# Patient Record
Sex: Female | Born: 1970 | Hispanic: Yes | Marital: Married | Smoking: Never smoker
Health system: Southern US, Community
[De-identification: ages and names within clinical notes are randomized; demographics above are authoritative.]

## PROBLEM LIST (undated history)

## (undated) DIAGNOSIS — E119 Type 2 diabetes mellitus without complications: Secondary | ICD-10-CM

## (undated) DIAGNOSIS — N83202 Unspecified ovarian cyst, left side: Secondary | ICD-10-CM

## (undated) DIAGNOSIS — Z8673 Personal history of transient ischemic attack (TIA), and cerebral infarction without residual deficits: Secondary | ICD-10-CM

## (undated) DIAGNOSIS — K760 Fatty (change of) liver, not elsewhere classified: Secondary | ICD-10-CM

## (undated) DIAGNOSIS — E039 Hypothyroidism, unspecified: Secondary | ICD-10-CM

## (undated) DIAGNOSIS — T8859XA Other complications of anesthesia, initial encounter: Secondary | ICD-10-CM

## (undated) DIAGNOSIS — N83201 Unspecified ovarian cyst, right side: Secondary | ICD-10-CM

## (undated) DIAGNOSIS — K219 Gastro-esophageal reflux disease without esophagitis: Secondary | ICD-10-CM

## (undated) DIAGNOSIS — Z8759 Personal history of other complications of pregnancy, childbirth and the puerperium: Secondary | ICD-10-CM

## (undated) DIAGNOSIS — T4145XA Adverse effect of unspecified anesthetic, initial encounter: Secondary | ICD-10-CM

## (undated) DIAGNOSIS — Z8639 Personal history of other endocrine, nutritional and metabolic disease: Secondary | ICD-10-CM

## (undated) DIAGNOSIS — I10 Essential (primary) hypertension: Secondary | ICD-10-CM

## (undated) DIAGNOSIS — I639 Cerebral infarction, unspecified: Secondary | ICD-10-CM

## (undated) DIAGNOSIS — J45998 Other asthma: Secondary | ICD-10-CM

## (undated) DIAGNOSIS — E282 Polycystic ovarian syndrome: Secondary | ICD-10-CM

## (undated) DIAGNOSIS — Z8719 Personal history of other diseases of the digestive system: Secondary | ICD-10-CM

## (undated) DIAGNOSIS — N7011 Chronic salpingitis: Secondary | ICD-10-CM

## (undated) DIAGNOSIS — N83209 Unspecified ovarian cyst, unspecified side: Secondary | ICD-10-CM

## (undated) DIAGNOSIS — E0590 Thyrotoxicosis, unspecified without thyrotoxic crisis or storm: Secondary | ICD-10-CM

## (undated) DIAGNOSIS — Z9889 Other specified postprocedural states: Secondary | ICD-10-CM

## (undated) DIAGNOSIS — K649 Unspecified hemorrhoids: Secondary | ICD-10-CM

## (undated) DIAGNOSIS — G43909 Migraine, unspecified, not intractable, without status migrainosus: Secondary | ICD-10-CM

## (undated) DIAGNOSIS — J45909 Unspecified asthma, uncomplicated: Secondary | ICD-10-CM

## (undated) DIAGNOSIS — E739 Lactose intolerance, unspecified: Secondary | ICD-10-CM

## (undated) DIAGNOSIS — N736 Female pelvic peritoneal adhesions (postinfective): Secondary | ICD-10-CM

## (undated) DIAGNOSIS — E890 Postprocedural hypothyroidism: Secondary | ICD-10-CM

## (undated) DIAGNOSIS — G8929 Other chronic pain: Secondary | ICD-10-CM

## (undated) DIAGNOSIS — D649 Anemia, unspecified: Secondary | ICD-10-CM

## (undated) DIAGNOSIS — R112 Nausea with vomiting, unspecified: Secondary | ICD-10-CM

## (undated) DIAGNOSIS — O039 Complete or unspecified spontaneous abortion without complication: Secondary | ICD-10-CM

## (undated) HISTORY — DX: Hypothyroidism, unspecified: E03.9

## (undated) HISTORY — PX: THYROID SURGERY: SHX805

## (undated) HISTORY — PX: COLONOSCOPY: SHX174

## (undated) HISTORY — DX: Thyrotoxicosis, unspecified without thyrotoxic crisis or storm: E05.90

## (undated) HISTORY — DX: Fatty (change of) liver, not elsewhere classified: K76.0

## (undated) HISTORY — PX: LAPAROSCOPIC LYSIS INTESTINAL ADHESIONS: SUR778

## (undated) HISTORY — DX: Lactose intolerance, unspecified: E73.9

## (undated) HISTORY — PX: ANAL SPHINCTEROTOMY: SHX1140

## (undated) HISTORY — DX: Complete or unspecified spontaneous abortion without complication: O03.9

## (undated) HISTORY — DX: Unspecified asthma, uncomplicated: J45.909

## (undated) HISTORY — PX: EXPLORATORY LAPAROTOMY: SUR591

## (undated) HISTORY — PX: FLEXIBLE SIGMOIDOSCOPY: SHX1649

## (undated) HISTORY — PX: CERVICAL CERCLAGE: SHX1329

---

## 1995-09-04 HISTORY — PX: LAPAROSCOPIC CHOLECYSTECTOMY: SUR755

## 1995-09-04 HISTORY — PX: APPENDECTOMY: SHX54

## 1995-09-04 HISTORY — PX: CHOLECYSTECTOMY: SHX55

## 2000-04-08 ENCOUNTER — Emergency Department (HOSPITAL_COMMUNITY): Admission: EM | Admit: 2000-04-08 | Discharge: 2000-04-08 | Payer: Self-pay | Admitting: Emergency Medicine

## 2000-04-08 ENCOUNTER — Encounter: Payer: Self-pay | Admitting: Emergency Medicine

## 2000-04-09 ENCOUNTER — Encounter: Payer: Self-pay | Admitting: General Surgery

## 2000-04-09 ENCOUNTER — Ambulatory Visit (HOSPITAL_COMMUNITY): Admission: RE | Admit: 2000-04-09 | Discharge: 2000-04-09 | Payer: Self-pay | Admitting: General Surgery

## 2000-04-30 ENCOUNTER — Ambulatory Visit (HOSPITAL_COMMUNITY): Admission: RE | Admit: 2000-04-30 | Discharge: 2000-04-30 | Payer: Self-pay | Admitting: Gastroenterology

## 2000-04-30 ENCOUNTER — Encounter: Payer: Self-pay | Admitting: Gastroenterology

## 2000-10-21 ENCOUNTER — Encounter: Admission: RE | Admit: 2000-10-21 | Discharge: 2000-10-21 | Payer: Self-pay | Admitting: Family Medicine

## 2000-10-28 ENCOUNTER — Encounter: Admission: RE | Admit: 2000-10-28 | Discharge: 2000-10-28 | Payer: Self-pay | Admitting: Family Medicine

## 2000-12-09 ENCOUNTER — Encounter: Admission: RE | Admit: 2000-12-09 | Discharge: 2000-12-09 | Payer: Self-pay | Admitting: Family Medicine

## 2000-12-30 ENCOUNTER — Encounter: Admission: RE | Admit: 2000-12-30 | Discharge: 2000-12-30 | Payer: Self-pay | Admitting: Family Medicine

## 2001-01-13 ENCOUNTER — Encounter: Admission: RE | Admit: 2001-01-13 | Discharge: 2001-01-13 | Payer: Self-pay | Admitting: Family Medicine

## 2001-01-27 ENCOUNTER — Encounter: Admission: RE | Admit: 2001-01-27 | Discharge: 2001-01-27 | Payer: Self-pay | Admitting: Family Medicine

## 2001-02-03 ENCOUNTER — Encounter: Admission: RE | Admit: 2001-02-03 | Discharge: 2001-02-03 | Payer: Self-pay | Admitting: Family Medicine

## 2001-02-17 ENCOUNTER — Ambulatory Visit (HOSPITAL_COMMUNITY): Admission: RE | Admit: 2001-02-17 | Discharge: 2001-02-17 | Payer: Self-pay | Admitting: Family Medicine

## 2001-02-18 ENCOUNTER — Encounter: Admission: RE | Admit: 2001-02-18 | Discharge: 2001-02-18 | Payer: Self-pay | Admitting: Sports Medicine

## 2001-02-18 ENCOUNTER — Encounter: Payer: Self-pay | Admitting: Family Medicine

## 2001-03-19 ENCOUNTER — Ambulatory Visit (HOSPITAL_COMMUNITY): Admission: RE | Admit: 2001-03-19 | Discharge: 2001-03-19 | Payer: Self-pay | Admitting: Family Medicine

## 2001-03-19 ENCOUNTER — Encounter: Payer: Self-pay | Admitting: Family Medicine

## 2001-03-24 ENCOUNTER — Encounter: Admission: RE | Admit: 2001-03-24 | Discharge: 2001-03-24 | Payer: Self-pay | Admitting: Family Medicine

## 2001-04-25 ENCOUNTER — Encounter: Admission: RE | Admit: 2001-04-25 | Discharge: 2001-04-25 | Payer: Self-pay | Admitting: Family Medicine

## 2001-05-21 ENCOUNTER — Encounter: Admission: RE | Admit: 2001-05-21 | Discharge: 2001-05-21 | Payer: Self-pay | Admitting: Family Medicine

## 2001-07-14 ENCOUNTER — Encounter: Admission: RE | Admit: 2001-07-14 | Discharge: 2001-07-14 | Payer: Self-pay | Admitting: Family Medicine

## 2001-07-16 ENCOUNTER — Encounter: Admission: RE | Admit: 2001-07-16 | Discharge: 2001-07-16 | Payer: Self-pay | Admitting: Family Medicine

## 2001-10-01 ENCOUNTER — Encounter: Admission: RE | Admit: 2001-10-01 | Discharge: 2001-10-01 | Payer: Self-pay | Admitting: Family Medicine

## 2001-10-13 ENCOUNTER — Encounter: Admission: RE | Admit: 2001-10-13 | Discharge: 2001-10-13 | Payer: Self-pay | Admitting: Family Medicine

## 2001-10-22 ENCOUNTER — Encounter: Admission: RE | Admit: 2001-10-22 | Discharge: 2001-10-22 | Payer: Self-pay | Admitting: Family Medicine

## 2001-10-24 ENCOUNTER — Encounter: Payer: Self-pay | Admitting: Family Medicine

## 2001-10-24 ENCOUNTER — Encounter: Admission: RE | Admit: 2001-10-24 | Discharge: 2001-10-24 | Payer: Self-pay | Admitting: Family Medicine

## 2001-12-09 ENCOUNTER — Encounter: Admission: RE | Admit: 2001-12-09 | Discharge: 2001-12-09 | Payer: Self-pay | Admitting: Family Medicine

## 2001-12-22 ENCOUNTER — Encounter: Admission: RE | Admit: 2001-12-22 | Discharge: 2001-12-22 | Payer: Self-pay | Admitting: Family Medicine

## 2001-12-23 ENCOUNTER — Encounter: Admission: RE | Admit: 2001-12-23 | Discharge: 2001-12-23 | Payer: Self-pay | Admitting: Family Medicine

## 2002-01-20 ENCOUNTER — Encounter: Admission: RE | Admit: 2002-01-20 | Discharge: 2002-01-20 | Payer: Self-pay | Admitting: Family Medicine

## 2002-06-29 ENCOUNTER — Encounter: Admission: RE | Admit: 2002-06-29 | Discharge: 2002-06-29 | Payer: Self-pay | Admitting: Family Medicine

## 2002-07-02 ENCOUNTER — Encounter: Admission: RE | Admit: 2002-07-02 | Discharge: 2002-07-02 | Payer: Self-pay | Admitting: Family Medicine

## 2002-07-13 ENCOUNTER — Encounter: Admission: RE | Admit: 2002-07-13 | Discharge: 2002-07-13 | Payer: Self-pay | Admitting: Family Medicine

## 2002-07-27 ENCOUNTER — Encounter: Admission: RE | Admit: 2002-07-27 | Discharge: 2002-07-27 | Payer: Self-pay | Admitting: Sports Medicine

## 2002-08-21 ENCOUNTER — Encounter: Admission: RE | Admit: 2002-08-21 | Discharge: 2002-08-21 | Payer: Self-pay | Admitting: Family Medicine

## 2002-08-24 ENCOUNTER — Encounter: Admission: RE | Admit: 2002-08-24 | Discharge: 2002-08-24 | Payer: Self-pay | Admitting: Family Medicine

## 2002-11-02 ENCOUNTER — Encounter (INDEPENDENT_AMBULATORY_CARE_PROVIDER_SITE_OTHER): Payer: Self-pay | Admitting: *Deleted

## 2002-11-02 LAB — CONVERTED CEMR LAB

## 2002-11-03 ENCOUNTER — Encounter: Admission: RE | Admit: 2002-11-03 | Discharge: 2002-11-03 | Payer: Self-pay | Admitting: Family Medicine

## 2002-11-26 ENCOUNTER — Encounter: Admission: RE | Admit: 2002-11-26 | Discharge: 2002-11-26 | Payer: Self-pay | Admitting: Family Medicine

## 2002-11-28 ENCOUNTER — Emergency Department (HOSPITAL_COMMUNITY): Admission: EM | Admit: 2002-11-28 | Discharge: 2002-11-28 | Payer: Self-pay | Admitting: Emergency Medicine

## 2002-12-07 ENCOUNTER — Encounter: Admission: RE | Admit: 2002-12-07 | Discharge: 2002-12-07 | Payer: Self-pay | Admitting: Family Medicine

## 2002-12-16 ENCOUNTER — Encounter: Admission: RE | Admit: 2002-12-16 | Discharge: 2002-12-16 | Payer: Self-pay | Admitting: Family Medicine

## 2002-12-18 ENCOUNTER — Encounter: Admission: RE | Admit: 2002-12-18 | Discharge: 2002-12-18 | Payer: Self-pay | Admitting: Family Medicine

## 2002-12-21 ENCOUNTER — Encounter: Admission: RE | Admit: 2002-12-21 | Discharge: 2002-12-21 | Payer: Self-pay | Admitting: Family Medicine

## 2002-12-24 ENCOUNTER — Encounter: Admission: RE | Admit: 2002-12-24 | Discharge: 2002-12-24 | Payer: Self-pay | Admitting: Family Medicine

## 2003-01-06 ENCOUNTER — Encounter: Admission: RE | Admit: 2003-01-06 | Discharge: 2003-01-06 | Payer: Self-pay | Admitting: Family Medicine

## 2003-01-11 ENCOUNTER — Encounter: Admission: RE | Admit: 2003-01-11 | Discharge: 2003-01-11 | Payer: Self-pay | Admitting: Family Medicine

## 2003-02-17 ENCOUNTER — Encounter: Admission: RE | Admit: 2003-02-17 | Discharge: 2003-02-17 | Payer: Self-pay | Admitting: Family Medicine

## 2003-03-10 ENCOUNTER — Encounter: Admission: RE | Admit: 2003-03-10 | Discharge: 2003-03-10 | Payer: Self-pay | Admitting: Family Medicine

## 2003-03-17 ENCOUNTER — Encounter: Admission: RE | Admit: 2003-03-17 | Discharge: 2003-03-17 | Payer: Self-pay | Admitting: Family Medicine

## 2003-05-20 ENCOUNTER — Encounter: Admission: RE | Admit: 2003-05-20 | Discharge: 2003-05-20 | Payer: Self-pay | Admitting: Family Medicine

## 2003-06-08 ENCOUNTER — Encounter: Admission: RE | Admit: 2003-06-08 | Discharge: 2003-06-08 | Payer: Self-pay | Admitting: Family Medicine

## 2003-07-07 ENCOUNTER — Encounter: Admission: RE | Admit: 2003-07-07 | Discharge: 2003-07-07 | Payer: Self-pay | Admitting: Family Medicine

## 2003-09-08 ENCOUNTER — Encounter: Admission: RE | Admit: 2003-09-08 | Discharge: 2003-09-08 | Payer: Self-pay | Admitting: Family Medicine

## 2003-10-27 ENCOUNTER — Encounter: Admission: RE | Admit: 2003-10-27 | Discharge: 2003-10-27 | Payer: Self-pay | Admitting: Family Medicine

## 2003-11-04 ENCOUNTER — Emergency Department (HOSPITAL_COMMUNITY): Admission: EM | Admit: 2003-11-04 | Discharge: 2003-11-04 | Payer: Self-pay | Admitting: Physical Therapy

## 2003-11-10 ENCOUNTER — Encounter: Admission: RE | Admit: 2003-11-10 | Discharge: 2003-11-10 | Payer: Self-pay | Admitting: Family Medicine

## 2003-12-13 ENCOUNTER — Encounter: Admission: RE | Admit: 2003-12-13 | Discharge: 2003-12-13 | Payer: Self-pay | Admitting: Family Medicine

## 2004-03-03 HISTORY — PX: EXPLORATORY LAPAROTOMY: SUR591

## 2004-06-21 ENCOUNTER — Ambulatory Visit: Payer: Self-pay | Admitting: Family Medicine

## 2004-08-02 ENCOUNTER — Ambulatory Visit: Payer: Self-pay | Admitting: Family Medicine

## 2004-10-24 ENCOUNTER — Ambulatory Visit: Payer: Self-pay | Admitting: Family Medicine

## 2004-11-01 ENCOUNTER — Ambulatory Visit: Payer: Self-pay | Admitting: Family Medicine

## 2004-11-01 ENCOUNTER — Encounter: Admission: RE | Admit: 2004-11-01 | Discharge: 2004-11-01 | Payer: Self-pay | Admitting: Family Medicine

## 2004-11-01 ENCOUNTER — Inpatient Hospital Stay (HOSPITAL_COMMUNITY): Admission: AD | Admit: 2004-11-01 | Discharge: 2004-11-01 | Payer: Self-pay | Admitting: *Deleted

## 2004-11-01 ENCOUNTER — Encounter (INDEPENDENT_AMBULATORY_CARE_PROVIDER_SITE_OTHER): Payer: Self-pay | Admitting: Family Medicine

## 2004-12-27 ENCOUNTER — Ambulatory Visit: Payer: Self-pay | Admitting: Family Medicine

## 2005-01-02 ENCOUNTER — Ambulatory Visit: Payer: Self-pay | Admitting: Family Medicine

## 2005-01-03 ENCOUNTER — Ambulatory Visit (HOSPITAL_COMMUNITY): Admission: RE | Admit: 2005-01-03 | Discharge: 2005-01-03 | Payer: Self-pay | Admitting: Family Medicine

## 2005-01-30 ENCOUNTER — Ambulatory Visit: Payer: Self-pay | Admitting: Family Medicine

## 2005-02-26 ENCOUNTER — Ambulatory Visit (HOSPITAL_COMMUNITY): Admission: RE | Admit: 2005-02-26 | Discharge: 2005-02-26 | Payer: Self-pay | Admitting: Family Medicine

## 2005-03-13 ENCOUNTER — Ambulatory Visit: Payer: Self-pay | Admitting: Family Medicine

## 2005-09-07 ENCOUNTER — Inpatient Hospital Stay (HOSPITAL_COMMUNITY): Admission: AD | Admit: 2005-09-07 | Discharge: 2005-09-07 | Payer: Self-pay | Admitting: Family Medicine

## 2005-10-02 ENCOUNTER — Ambulatory Visit: Payer: Self-pay | Admitting: Family Medicine

## 2005-10-16 ENCOUNTER — Ambulatory Visit: Admission: RE | Admit: 2005-10-16 | Discharge: 2005-10-16 | Payer: Self-pay | Admitting: Emergency Medicine

## 2005-10-16 ENCOUNTER — Emergency Department (HOSPITAL_COMMUNITY): Admission: EM | Admit: 2005-10-16 | Discharge: 2005-10-16 | Payer: Self-pay | Admitting: Family Medicine

## 2005-11-07 ENCOUNTER — Ambulatory Visit: Payer: Self-pay | Admitting: Family Medicine

## 2005-11-27 ENCOUNTER — Emergency Department (HOSPITAL_COMMUNITY): Admission: EM | Admit: 2005-11-27 | Discharge: 2005-11-27 | Payer: Self-pay | Admitting: Family Medicine

## 2005-11-29 ENCOUNTER — Ambulatory Visit: Payer: Self-pay | Admitting: Family Medicine

## 2006-02-23 ENCOUNTER — Emergency Department (HOSPITAL_COMMUNITY): Admission: EM | Admit: 2006-02-23 | Discharge: 2006-02-24 | Payer: Self-pay | Admitting: Emergency Medicine

## 2006-02-25 ENCOUNTER — Ambulatory Visit: Payer: Self-pay | Admitting: Family Medicine

## 2006-02-27 ENCOUNTER — Ambulatory Visit: Payer: Self-pay | Admitting: Family Medicine

## 2006-03-19 ENCOUNTER — Ambulatory Visit: Payer: Self-pay | Admitting: Family Medicine

## 2006-03-20 LAB — CONVERTED CEMR LAB
LDL Cholesterol: 177 mg/dL
VLDL: 65 mg/dL

## 2006-04-01 ENCOUNTER — Ambulatory Visit: Payer: Self-pay | Admitting: Family Medicine

## 2006-04-03 ENCOUNTER — Ambulatory Visit (HOSPITAL_COMMUNITY): Admission: RE | Admit: 2006-04-03 | Discharge: 2006-04-03 | Payer: Self-pay | Admitting: Family Medicine

## 2006-04-03 ENCOUNTER — Ambulatory Visit: Payer: Self-pay | Admitting: Family Medicine

## 2006-04-11 ENCOUNTER — Inpatient Hospital Stay (HOSPITAL_COMMUNITY): Admission: AD | Admit: 2006-04-11 | Discharge: 2006-04-11 | Payer: Self-pay | Admitting: Family Medicine

## 2006-04-17 ENCOUNTER — Inpatient Hospital Stay (HOSPITAL_COMMUNITY): Admission: AD | Admit: 2006-04-17 | Discharge: 2006-04-17 | Payer: Self-pay | Admitting: Gynecology

## 2006-04-22 ENCOUNTER — Inpatient Hospital Stay (HOSPITAL_COMMUNITY): Admission: AD | Admit: 2006-04-22 | Discharge: 2006-04-22 | Payer: Self-pay | Admitting: Gynecology

## 2006-04-25 ENCOUNTER — Inpatient Hospital Stay (HOSPITAL_COMMUNITY): Admission: AD | Admit: 2006-04-25 | Discharge: 2006-04-25 | Payer: Self-pay | Admitting: Obstetrics and Gynecology

## 2006-04-28 ENCOUNTER — Inpatient Hospital Stay (HOSPITAL_COMMUNITY): Admission: AD | Admit: 2006-04-28 | Discharge: 2006-04-28 | Payer: Self-pay | Admitting: Family Medicine

## 2006-04-30 ENCOUNTER — Ambulatory Visit: Payer: Self-pay | Admitting: Obstetrics and Gynecology

## 2006-04-30 ENCOUNTER — Inpatient Hospital Stay (HOSPITAL_COMMUNITY): Admission: AD | Admit: 2006-04-30 | Discharge: 2006-05-01 | Payer: Self-pay | Admitting: Family Medicine

## 2006-05-05 ENCOUNTER — Inpatient Hospital Stay (HOSPITAL_COMMUNITY): Admission: AD | Admit: 2006-05-05 | Discharge: 2006-05-06 | Payer: Self-pay | Admitting: Obstetrics and Gynecology

## 2006-05-12 ENCOUNTER — Inpatient Hospital Stay (HOSPITAL_COMMUNITY): Admission: AD | Admit: 2006-05-12 | Discharge: 2006-05-12 | Payer: Self-pay | Admitting: Obstetrics & Gynecology

## 2006-05-26 ENCOUNTER — Inpatient Hospital Stay (HOSPITAL_COMMUNITY): Admission: AD | Admit: 2006-05-26 | Discharge: 2006-05-26 | Payer: Self-pay | Admitting: Family Medicine

## 2006-05-29 ENCOUNTER — Emergency Department (HOSPITAL_COMMUNITY): Admission: EM | Admit: 2006-05-29 | Discharge: 2006-05-30 | Payer: Self-pay | Admitting: Emergency Medicine

## 2006-07-18 ENCOUNTER — Emergency Department (HOSPITAL_COMMUNITY): Admission: EM | Admit: 2006-07-18 | Discharge: 2006-07-18 | Payer: Self-pay | Admitting: Emergency Medicine

## 2006-08-21 ENCOUNTER — Ambulatory Visit: Payer: Self-pay | Admitting: Family Medicine

## 2006-09-05 ENCOUNTER — Emergency Department (HOSPITAL_COMMUNITY): Admission: EM | Admit: 2006-09-05 | Discharge: 2006-09-05 | Payer: Self-pay | Admitting: Family Medicine

## 2006-09-09 ENCOUNTER — Encounter: Payer: Self-pay | Admitting: Family Medicine

## 2006-09-09 ENCOUNTER — Ambulatory Visit: Payer: Self-pay | Admitting: Family Medicine

## 2006-09-09 LAB — CONVERTED CEMR LAB
ALT: 31 units/L (ref 0–35)
Albumin: 4.1 g/dL (ref 3.5–5.2)
CO2: 25 meq/L (ref 19–32)
Calcium: 9.2 mg/dL (ref 8.4–10.5)
Chloride: 106 meq/L (ref 96–112)
Glucose, Bld: 80 mg/dL (ref 70–99)
Sodium: 142 meq/L (ref 135–145)
TSH: 2.156 microintl units/mL (ref 0.350–5.50)
Total Protein: 7.2 g/dL (ref 6.0–8.3)

## 2006-10-06 ENCOUNTER — Emergency Department (HOSPITAL_COMMUNITY): Admission: EM | Admit: 2006-10-06 | Discharge: 2006-10-06 | Payer: Self-pay | Admitting: Family Medicine

## 2006-10-08 ENCOUNTER — Ambulatory Visit: Payer: Self-pay | Admitting: Family Medicine

## 2006-10-16 ENCOUNTER — Ambulatory Visit: Payer: Self-pay | Admitting: Sports Medicine

## 2006-10-31 DIAGNOSIS — I10 Essential (primary) hypertension: Secondary | ICD-10-CM | POA: Insufficient documentation

## 2006-10-31 DIAGNOSIS — E785 Hyperlipidemia, unspecified: Secondary | ICD-10-CM | POA: Insufficient documentation

## 2006-10-31 DIAGNOSIS — E669 Obesity, unspecified: Secondary | ICD-10-CM | POA: Insufficient documentation

## 2006-10-31 DIAGNOSIS — K429 Umbilical hernia without obstruction or gangrene: Secondary | ICD-10-CM | POA: Insufficient documentation

## 2006-10-31 DIAGNOSIS — E663 Overweight: Secondary | ICD-10-CM | POA: Insufficient documentation

## 2006-10-31 DIAGNOSIS — Z6825 Body mass index (BMI) 25.0-25.9, adult: Secondary | ICD-10-CM | POA: Insufficient documentation

## 2006-10-31 DIAGNOSIS — E1169 Type 2 diabetes mellitus with other specified complication: Secondary | ICD-10-CM | POA: Insufficient documentation

## 2006-10-31 DIAGNOSIS — E039 Hypothyroidism, unspecified: Secondary | ICD-10-CM | POA: Insufficient documentation

## 2006-10-31 DIAGNOSIS — J45909 Unspecified asthma, uncomplicated: Secondary | ICD-10-CM

## 2006-10-31 DIAGNOSIS — N926 Irregular menstruation, unspecified: Secondary | ICD-10-CM | POA: Insufficient documentation

## 2006-11-01 ENCOUNTER — Encounter (INDEPENDENT_AMBULATORY_CARE_PROVIDER_SITE_OTHER): Payer: Self-pay | Admitting: *Deleted

## 2006-12-09 ENCOUNTER — Emergency Department (HOSPITAL_COMMUNITY): Admission: EM | Admit: 2006-12-09 | Discharge: 2006-12-09 | Payer: Self-pay | Admitting: Family Medicine

## 2006-12-11 ENCOUNTER — Encounter: Payer: Self-pay | Admitting: Family Medicine

## 2006-12-11 ENCOUNTER — Telehealth (INDEPENDENT_AMBULATORY_CARE_PROVIDER_SITE_OTHER): Payer: Self-pay | Admitting: *Deleted

## 2006-12-11 ENCOUNTER — Ambulatory Visit: Payer: Self-pay | Admitting: Sports Medicine

## 2006-12-11 ENCOUNTER — Ambulatory Visit (HOSPITAL_COMMUNITY): Admission: RE | Admit: 2006-12-11 | Discharge: 2006-12-11 | Payer: Self-pay | Admitting: Family Medicine

## 2006-12-11 DIAGNOSIS — R21 Rash and other nonspecific skin eruption: Secondary | ICD-10-CM

## 2006-12-11 LAB — CONVERTED CEMR LAB
BUN: 17 mg/dL (ref 6–23)
Calcium: 9.2 mg/dL (ref 8.4–10.5)
Creatinine, Ser: 0.97 mg/dL (ref 0.40–1.20)
TSH: 25.95 microintl units/mL — ABNORMAL HIGH (ref 0.350–5.50)

## 2006-12-12 ENCOUNTER — Telehealth (INDEPENDENT_AMBULATORY_CARE_PROVIDER_SITE_OTHER): Payer: Self-pay | Admitting: *Deleted

## 2006-12-12 ENCOUNTER — Encounter: Payer: Self-pay | Admitting: Family Medicine

## 2006-12-12 ENCOUNTER — Ambulatory Visit: Payer: Self-pay | Admitting: Family Medicine

## 2006-12-12 ENCOUNTER — Ambulatory Visit (HOSPITAL_COMMUNITY): Admission: RE | Admit: 2006-12-12 | Discharge: 2006-12-12 | Payer: Self-pay | Admitting: Family Medicine

## 2006-12-12 ENCOUNTER — Inpatient Hospital Stay (HOSPITAL_COMMUNITY): Admission: EM | Admit: 2006-12-12 | Discharge: 2006-12-14 | Payer: Self-pay | Admitting: Emergency Medicine

## 2006-12-12 ENCOUNTER — Ambulatory Visit: Payer: Self-pay | Admitting: Internal Medicine

## 2006-12-12 DIAGNOSIS — I6350 Cerebral infarction due to unspecified occlusion or stenosis of unspecified cerebral artery: Secondary | ICD-10-CM

## 2006-12-12 DIAGNOSIS — Z8673 Personal history of transient ischemic attack (TIA), and cerebral infarction without residual deficits: Secondary | ICD-10-CM | POA: Insufficient documentation

## 2006-12-13 ENCOUNTER — Encounter: Payer: Self-pay | Admitting: Internal Medicine

## 2006-12-13 ENCOUNTER — Ambulatory Visit: Payer: Self-pay | Admitting: Vascular Surgery

## 2006-12-13 LAB — CONVERTED CEMR LAB
Total CHOL/HDL Ratio: 8.2
Triglycerides: 305 mg/dL

## 2006-12-16 ENCOUNTER — Ambulatory Visit: Payer: Self-pay | Admitting: Family Medicine

## 2006-12-24 ENCOUNTER — Telehealth (INDEPENDENT_AMBULATORY_CARE_PROVIDER_SITE_OTHER): Payer: Self-pay | Admitting: *Deleted

## 2006-12-24 ENCOUNTER — Ambulatory Visit: Payer: Self-pay | Admitting: *Deleted

## 2006-12-30 ENCOUNTER — Ambulatory Visit: Payer: Self-pay | Admitting: Family Medicine

## 2006-12-30 DIAGNOSIS — F329 Major depressive disorder, single episode, unspecified: Secondary | ICD-10-CM | POA: Insufficient documentation

## 2006-12-30 LAB — CONVERTED CEMR LAB
ALT: 39 units/L — ABNORMAL HIGH (ref 0–35)
CO2: 22 meq/L (ref 19–32)
Calcium: 9.2 mg/dL (ref 8.4–10.5)
Chloride: 105 meq/L (ref 96–112)
Creatinine, Ser: 0.65 mg/dL (ref 0.40–1.20)
Total CK: 44 units/L (ref 7–177)
Total Protein: 7.4 g/dL (ref 6.0–8.3)

## 2007-02-05 ENCOUNTER — Telehealth: Payer: Self-pay | Admitting: Family Medicine

## 2007-02-10 ENCOUNTER — Ambulatory Visit: Payer: Self-pay | Admitting: Family Medicine

## 2007-02-10 LAB — CONVERTED CEMR LAB: HDL goal, serum: 40 mg/dL

## 2007-02-13 LAB — CONVERTED CEMR LAB
Potassium: 4.9 meq/L (ref 3.5–5.3)
Sodium: 141 meq/L (ref 135–145)
TSH: 0.032 microintl units/mL — ABNORMAL LOW (ref 0.350–5.50)

## 2007-03-16 ENCOUNTER — Ambulatory Visit: Payer: Self-pay | Admitting: Family Medicine

## 2007-03-16 ENCOUNTER — Encounter: Payer: Self-pay | Admitting: *Deleted

## 2007-03-16 ENCOUNTER — Inpatient Hospital Stay (HOSPITAL_COMMUNITY): Admission: EM | Admit: 2007-03-16 | Discharge: 2007-03-16 | Payer: Self-pay | Admitting: Emergency Medicine

## 2007-03-16 LAB — CONVERTED CEMR LAB
Cholesterol: 252 mg/dL
HDL: 30 mg/dL
TSH: 72.55 microintl units/mL
Triglycerides: 326 mg/dL

## 2007-03-17 ENCOUNTER — Ambulatory Visit: Payer: Self-pay | Admitting: Family Medicine

## 2007-03-31 ENCOUNTER — Ambulatory Visit: Payer: Self-pay | Admitting: Family Medicine

## 2007-05-12 ENCOUNTER — Ambulatory Visit: Payer: Self-pay | Admitting: Family Medicine

## 2007-05-12 DIAGNOSIS — G43909 Migraine, unspecified, not intractable, without status migrainosus: Secondary | ICD-10-CM | POA: Insufficient documentation

## 2007-05-14 LAB — CONVERTED CEMR LAB
CO2: 24 meq/L (ref 19–32)
Creatinine, Ser: 0.69 mg/dL (ref 0.40–1.20)
Glucose, Bld: 115 mg/dL — ABNORMAL HIGH (ref 70–99)
LDH: 128 units/L (ref 94–250)
TSH: 0.684 microintl units/mL (ref 0.350–5.50)
Total Bilirubin: 0.3 mg/dL (ref 0.3–1.2)
Total Protein: 7.5 g/dL (ref 6.0–8.3)

## 2007-05-26 ENCOUNTER — Ambulatory Visit: Payer: Self-pay | Admitting: Family Medicine

## 2007-06-05 ENCOUNTER — Inpatient Hospital Stay (HOSPITAL_COMMUNITY): Admission: AD | Admit: 2007-06-05 | Discharge: 2007-06-06 | Payer: Self-pay | Admitting: Family Medicine

## 2007-06-06 ENCOUNTER — Telehealth (INDEPENDENT_AMBULATORY_CARE_PROVIDER_SITE_OTHER): Payer: Self-pay | Admitting: *Deleted

## 2007-06-09 ENCOUNTER — Ambulatory Visit: Payer: Self-pay | Admitting: Family Medicine

## 2007-06-11 ENCOUNTER — Inpatient Hospital Stay (HOSPITAL_COMMUNITY): Admission: AD | Admit: 2007-06-11 | Discharge: 2007-06-11 | Payer: Self-pay | Admitting: Obstetrics & Gynecology

## 2007-06-13 ENCOUNTER — Encounter: Payer: Self-pay | Admitting: Family Medicine

## 2007-06-25 ENCOUNTER — Other Ambulatory Visit: Payer: Self-pay | Admitting: Family Medicine

## 2007-06-25 ENCOUNTER — Inpatient Hospital Stay (HOSPITAL_COMMUNITY): Admission: AD | Admit: 2007-06-25 | Discharge: 2007-06-25 | Payer: Self-pay | Admitting: Family Medicine

## 2007-06-25 ENCOUNTER — Telehealth (INDEPENDENT_AMBULATORY_CARE_PROVIDER_SITE_OTHER): Payer: Self-pay | Admitting: *Deleted

## 2007-07-01 ENCOUNTER — Ambulatory Visit: Payer: Self-pay | Admitting: Family Medicine

## 2007-07-03 ENCOUNTER — Encounter: Payer: Self-pay | Admitting: Obstetrics & Gynecology

## 2007-07-03 ENCOUNTER — Ambulatory Visit: Payer: Self-pay | Admitting: Obstetrics & Gynecology

## 2007-07-04 ENCOUNTER — Ambulatory Visit (HOSPITAL_COMMUNITY): Admission: RE | Admit: 2007-07-04 | Discharge: 2007-07-04 | Payer: Self-pay | Admitting: Obstetrics & Gynecology

## 2007-07-10 ENCOUNTER — Ambulatory Visit: Payer: Self-pay | Admitting: Obstetrics & Gynecology

## 2007-07-17 ENCOUNTER — Ambulatory Visit: Payer: Self-pay | Admitting: Obstetrics & Gynecology

## 2007-07-21 ENCOUNTER — Ambulatory Visit: Payer: Self-pay | Admitting: Obstetrics & Gynecology

## 2007-07-21 ENCOUNTER — Encounter: Admission: RE | Admit: 2007-07-21 | Discharge: 2007-07-21 | Payer: Self-pay | Admitting: Obstetrics & Gynecology

## 2007-07-25 ENCOUNTER — Inpatient Hospital Stay (HOSPITAL_COMMUNITY): Admission: AD | Admit: 2007-07-25 | Discharge: 2007-07-25 | Payer: Self-pay | Admitting: Obstetrics & Gynecology

## 2007-07-28 ENCOUNTER — Ambulatory Visit: Payer: Self-pay | Admitting: Obstetrics & Gynecology

## 2007-08-08 ENCOUNTER — Ambulatory Visit: Payer: Self-pay | Admitting: *Deleted

## 2007-08-08 ENCOUNTER — Inpatient Hospital Stay (HOSPITAL_COMMUNITY): Admission: AD | Admit: 2007-08-08 | Discharge: 2007-08-09 | Payer: Self-pay | Admitting: Obstetrics & Gynecology

## 2007-08-08 ENCOUNTER — Encounter: Payer: Self-pay | Admitting: Obstetrics & Gynecology

## 2007-08-14 ENCOUNTER — Ambulatory Visit: Payer: Self-pay | Admitting: Obstetrics and Gynecology

## 2007-08-14 ENCOUNTER — Inpatient Hospital Stay (HOSPITAL_COMMUNITY): Admission: AD | Admit: 2007-08-14 | Discharge: 2007-08-15 | Payer: Self-pay | Admitting: Obstetrics & Gynecology

## 2007-08-18 ENCOUNTER — Ambulatory Visit: Payer: Self-pay | Admitting: Family Medicine

## 2007-08-18 DIAGNOSIS — O039 Complete or unspecified spontaneous abortion without complication: Secondary | ICD-10-CM | POA: Insufficient documentation

## 2007-08-18 DIAGNOSIS — E739 Lactose intolerance, unspecified: Secondary | ICD-10-CM | POA: Insufficient documentation

## 2007-08-18 LAB — CONVERTED CEMR LAB
Blood Glucose, AC Bkfst: 119 mg/dL
TSH: 1.838 microintl units/mL (ref 0.350–5.50)

## 2007-08-20 ENCOUNTER — Encounter: Payer: Self-pay | Admitting: Family Medicine

## 2007-09-17 ENCOUNTER — Telehealth: Payer: Self-pay | Admitting: Family Medicine

## 2007-10-10 ENCOUNTER — Ambulatory Visit: Payer: Self-pay | Admitting: Family Medicine

## 2008-06-14 ENCOUNTER — Encounter: Payer: Self-pay | Admitting: *Deleted

## 2008-08-02 ENCOUNTER — Ambulatory Visit: Payer: Self-pay | Admitting: Family Medicine

## 2008-08-03 ENCOUNTER — Telehealth: Payer: Self-pay | Admitting: Family Medicine

## 2008-08-14 ENCOUNTER — Inpatient Hospital Stay (HOSPITAL_COMMUNITY): Admission: AD | Admit: 2008-08-14 | Discharge: 2008-08-14 | Payer: Self-pay | Admitting: Obstetrics & Gynecology

## 2008-08-14 ENCOUNTER — Emergency Department (HOSPITAL_COMMUNITY): Admission: EM | Admit: 2008-08-14 | Discharge: 2008-08-14 | Payer: Self-pay | Admitting: Emergency Medicine

## 2008-08-18 ENCOUNTER — Encounter: Payer: Self-pay | Admitting: Family Medicine

## 2008-08-18 LAB — CONVERTED CEMR LAB
AST: 18 units/L (ref 0–37)
BUN: 11 mg/dL (ref 6–23)
Calcium: 9.3 mg/dL (ref 8.4–10.5)
Chloride: 106 meq/L (ref 96–112)
Creatinine, Ser: 0.77 mg/dL (ref 0.40–1.20)
HDL: 32 mg/dL — ABNORMAL LOW (ref >39)
TSH: 51.818 microintl units/mL — ABNORMAL HIGH (ref 0.350–4.50)
Total CHOL/HDL Ratio: 7
Triglycerides: 530 mg/dL — ABNORMAL HIGH (ref <150)

## 2008-08-19 ENCOUNTER — Ambulatory Visit: Payer: Self-pay | Admitting: Family Medicine

## 2008-08-23 ENCOUNTER — Ambulatory Visit: Payer: Self-pay | Admitting: Family Medicine

## 2008-08-24 ENCOUNTER — Ambulatory Visit (HOSPITAL_COMMUNITY): Admission: RE | Admit: 2008-08-24 | Discharge: 2008-08-24 | Payer: Self-pay | Admitting: Family Medicine

## 2008-08-30 ENCOUNTER — Ambulatory Visit (HOSPITAL_COMMUNITY): Admission: RE | Admit: 2008-08-30 | Discharge: 2008-08-30 | Payer: Self-pay | Admitting: Family Medicine

## 2008-08-30 ENCOUNTER — Encounter: Payer: Self-pay | Admitting: Obstetrics & Gynecology

## 2008-08-30 ENCOUNTER — Ambulatory Visit: Payer: Self-pay | Admitting: Family Medicine

## 2008-08-30 LAB — CONVERTED CEMR LAB
ALT: 40 units/L — ABNORMAL HIGH (ref 0–35)
Albumin: 4 g/dL (ref 3.5–5.2)
AntiThromb III Func: 89 % (ref 76–126)
Anticardiolipin IgA: 22 (ref <13)
BUN: 13 mg/dL (ref 6–23)
Calcium: 9.4 mg/dL (ref 8.4–10.5)
Glucose, Bld: 191 mg/dL — ABNORMAL HIGH (ref 70–99)
HCT: 41.3 % (ref 36.0–46.0)
Hemoglobin: 13.3 g/dL (ref 12.0–15.0)
Homocysteine: 3.9 micromoles/L — ABNORMAL LOW (ref 4.0–15.4)
MCHC: 32.2 g/dL (ref 30.0–36.0)
MCV: 87.7 fL (ref 78.0–100.0)
RDW: 14.6 % (ref 11.5–15.5)
Total Protein: 7.2 g/dL (ref 6.0–8.3)
Uric Acid, Serum: 5.1 mg/dL (ref 2.4–7.0)

## 2008-09-01 ENCOUNTER — Ambulatory Visit: Payer: Self-pay | Admitting: Obstetrics and Gynecology

## 2008-09-01 ENCOUNTER — Encounter: Payer: Self-pay | Admitting: Obstetrics & Gynecology

## 2008-09-01 LAB — CONVERTED CEMR LAB
Creatinine 24 HR UR: 1857 mg/24hr — ABNORMAL HIGH (ref 700–1800)
Creatinine Clearance: 190 mL/min — ABNORMAL HIGH (ref 75–115)
Protein, Ur: 98 mg/24hr (ref 50–100)

## 2008-09-06 ENCOUNTER — Ambulatory Visit: Payer: Self-pay | Admitting: Obstetrics & Gynecology

## 2008-09-08 ENCOUNTER — Inpatient Hospital Stay (HOSPITAL_COMMUNITY): Admission: AD | Admit: 2008-09-08 | Discharge: 2008-09-08 | Payer: Self-pay | Admitting: Obstetrics & Gynecology

## 2008-09-20 ENCOUNTER — Ambulatory Visit: Payer: Self-pay | Admitting: Family Medicine

## 2008-09-27 ENCOUNTER — Ambulatory Visit: Payer: Self-pay | Admitting: Obstetrics & Gynecology

## 2008-09-27 ENCOUNTER — Encounter: Admission: RE | Admit: 2008-09-27 | Discharge: 2008-12-26 | Payer: Self-pay | Admitting: Family Medicine

## 2008-10-04 ENCOUNTER — Ambulatory Visit: Payer: Self-pay | Admitting: Obstetrics & Gynecology

## 2008-10-04 ENCOUNTER — Ambulatory Visit (HOSPITAL_COMMUNITY): Admission: RE | Admit: 2008-10-04 | Discharge: 2008-10-04 | Payer: Self-pay | Admitting: Obstetrics and Gynecology

## 2008-10-18 ENCOUNTER — Ambulatory Visit: Payer: Self-pay | Admitting: Obstetrics & Gynecology

## 2008-11-01 ENCOUNTER — Ambulatory Visit: Payer: Self-pay | Admitting: Obstetrics & Gynecology

## 2008-11-01 LAB — CONVERTED CEMR LAB: TSH: 0.017 microintl units/mL — ABNORMAL LOW (ref 0.350–4.50)

## 2008-11-15 ENCOUNTER — Ambulatory Visit: Payer: Self-pay | Admitting: Family Medicine

## 2008-11-15 ENCOUNTER — Ambulatory Visit: Payer: Self-pay | Admitting: Obstetrics & Gynecology

## 2008-11-15 ENCOUNTER — Inpatient Hospital Stay (HOSPITAL_COMMUNITY): Admission: RE | Admit: 2008-11-15 | Discharge: 2008-11-17 | Payer: Self-pay | Admitting: Family Medicine

## 2008-11-15 LAB — CONVERTED CEMR LAB
Trich, Wet Prep: NONE SEEN
Yeast Wet Prep HPF POC: NONE SEEN

## 2008-11-22 ENCOUNTER — Encounter: Payer: Self-pay | Admitting: Obstetrics & Gynecology

## 2008-11-22 ENCOUNTER — Ambulatory Visit: Payer: Self-pay | Admitting: Family Medicine

## 2008-11-22 ENCOUNTER — Inpatient Hospital Stay (HOSPITAL_COMMUNITY): Admission: AD | Admit: 2008-11-22 | Discharge: 2008-11-23 | Payer: Self-pay | Admitting: Obstetrics & Gynecology

## 2008-11-29 ENCOUNTER — Ambulatory Visit: Payer: Self-pay | Admitting: Obstetrics & Gynecology

## 2008-11-29 ENCOUNTER — Inpatient Hospital Stay (HOSPITAL_COMMUNITY): Admission: AD | Admit: 2008-11-29 | Discharge: 2008-11-29 | Payer: Self-pay | Admitting: Obstetrics & Gynecology

## 2008-12-09 ENCOUNTER — Inpatient Hospital Stay (HOSPITAL_COMMUNITY): Admission: AD | Admit: 2008-12-09 | Discharge: 2008-12-09 | Payer: Self-pay | Admitting: Obstetrics & Gynecology

## 2008-12-09 ENCOUNTER — Ambulatory Visit: Payer: Self-pay | Admitting: Advanced Practice Midwife

## 2009-01-06 ENCOUNTER — Ambulatory Visit: Payer: Self-pay | Admitting: Obstetrics and Gynecology

## 2009-04-12 ENCOUNTER — Ambulatory Visit: Payer: Self-pay | Admitting: Family Medicine

## 2009-04-22 ENCOUNTER — Emergency Department (HOSPITAL_COMMUNITY): Admission: EM | Admit: 2009-04-22 | Discharge: 2009-04-22 | Payer: Self-pay | Admitting: Emergency Medicine

## 2009-04-26 ENCOUNTER — Inpatient Hospital Stay (HOSPITAL_COMMUNITY): Admission: AD | Admit: 2009-04-26 | Discharge: 2009-04-26 | Payer: Self-pay | Admitting: Obstetrics & Gynecology

## 2009-06-08 ENCOUNTER — Ambulatory Visit: Payer: Self-pay | Admitting: Obstetrics and Gynecology

## 2009-06-09 ENCOUNTER — Ambulatory Visit (HOSPITAL_COMMUNITY): Admission: RE | Admit: 2009-06-09 | Discharge: 2009-06-09 | Payer: Self-pay | Admitting: Obstetrics & Gynecology

## 2009-06-22 ENCOUNTER — Ambulatory Visit: Payer: Self-pay | Admitting: Obstetrics and Gynecology

## 2009-08-13 ENCOUNTER — Emergency Department (HOSPITAL_COMMUNITY): Admission: EM | Admit: 2009-08-13 | Discharge: 2009-08-13 | Payer: Self-pay | Admitting: Emergency Medicine

## 2009-08-22 ENCOUNTER — Ambulatory Visit: Payer: Self-pay | Admitting: Family Medicine

## 2009-08-22 DIAGNOSIS — M7712 Lateral epicondylitis, left elbow: Secondary | ICD-10-CM | POA: Insufficient documentation

## 2009-10-05 ENCOUNTER — Ambulatory Visit: Payer: Self-pay | Admitting: Family Medicine

## 2009-10-14 ENCOUNTER — Ambulatory Visit: Payer: Self-pay | Admitting: Family Medicine

## 2009-10-26 ENCOUNTER — Encounter: Payer: Self-pay | Admitting: Family Medicine

## 2009-10-31 ENCOUNTER — Ambulatory Visit: Payer: Self-pay | Admitting: Family Medicine

## 2009-11-19 ENCOUNTER — Emergency Department (HOSPITAL_COMMUNITY): Admission: EM | Admit: 2009-11-19 | Discharge: 2009-11-19 | Payer: Self-pay | Admitting: Family Medicine

## 2009-11-21 ENCOUNTER — Inpatient Hospital Stay (HOSPITAL_COMMUNITY): Admission: AD | Admit: 2009-11-21 | Discharge: 2009-11-21 | Payer: Self-pay | Admitting: Obstetrics & Gynecology

## 2009-12-13 ENCOUNTER — Encounter: Payer: Self-pay | Admitting: *Deleted

## 2009-12-19 ENCOUNTER — Ambulatory Visit: Payer: Self-pay | Admitting: Family Medicine

## 2009-12-22 ENCOUNTER — Ambulatory Visit: Payer: Self-pay | Admitting: Family Medicine

## 2009-12-22 ENCOUNTER — Encounter: Payer: Self-pay | Admitting: Family Medicine

## 2009-12-22 LAB — CONVERTED CEMR LAB
ALT: 29 units/L (ref 0–35)
CO2: 21 meq/L (ref 19–32)
Cholesterol: 225 mg/dL — ABNORMAL HIGH (ref 0–200)
Creatinine, Ser: 0.7 mg/dL (ref 0.40–1.20)
Glucose, Bld: 86 mg/dL (ref 70–99)
HCT: 41.3 % (ref 36.0–46.0)
MCV: 85.5 fL (ref 78.0–100.0)
Platelets: 157 10*3/uL (ref 150–400)
Total Bilirubin: 0.3 mg/dL (ref 0.3–1.2)
Total CHOL/HDL Ratio: 5.9
Triglycerides: 220 mg/dL — ABNORMAL HIGH (ref <150)
VLDL: 44 mg/dL — ABNORMAL HIGH (ref 0–40)
WBC: 7.6 10*3/uL (ref 4.0–10.5)

## 2010-01-27 ENCOUNTER — Ambulatory Visit: Payer: Self-pay | Admitting: Family Medicine

## 2010-01-27 DIAGNOSIS — K089 Disorder of teeth and supporting structures, unspecified: Secondary | ICD-10-CM | POA: Insufficient documentation

## 2010-02-27 ENCOUNTER — Ambulatory Visit: Payer: Self-pay | Admitting: Family Medicine

## 2010-02-28 ENCOUNTER — Encounter: Payer: Self-pay | Admitting: Family Medicine

## 2010-03-08 ENCOUNTER — Ambulatory Visit: Payer: Self-pay | Admitting: Family Medicine

## 2010-04-11 ENCOUNTER — Ambulatory Visit: Payer: Self-pay | Admitting: Family Medicine

## 2010-05-03 ENCOUNTER — Emergency Department (HOSPITAL_COMMUNITY): Admission: EM | Admit: 2010-05-03 | Discharge: 2010-05-03 | Payer: Self-pay | Admitting: Family Medicine

## 2010-06-25 ENCOUNTER — Encounter: Payer: Self-pay | Admitting: Family Medicine

## 2010-07-19 ENCOUNTER — Telehealth: Payer: Self-pay | Admitting: *Deleted

## 2010-08-22 ENCOUNTER — Ambulatory Visit: Payer: Self-pay | Admitting: Family Medicine

## 2010-08-22 DIAGNOSIS — J22 Unspecified acute lower respiratory infection: Secondary | ICD-10-CM | POA: Insufficient documentation

## 2010-08-22 DIAGNOSIS — J069 Acute upper respiratory infection, unspecified: Secondary | ICD-10-CM

## 2010-09-03 HISTORY — PX: OVARIAN CYST REMOVAL: SHX89

## 2010-09-20 ENCOUNTER — Inpatient Hospital Stay (HOSPITAL_COMMUNITY)
Admission: AD | Admit: 2010-09-20 | Discharge: 2010-09-20 | Payer: Self-pay | Source: Home / Self Care | Attending: Obstetrics and Gynecology | Admitting: Obstetrics and Gynecology

## 2010-09-24 ENCOUNTER — Encounter: Payer: Self-pay | Admitting: *Deleted

## 2010-09-24 ENCOUNTER — Encounter: Payer: Self-pay | Admitting: Obstetrics & Gynecology

## 2010-09-24 ENCOUNTER — Encounter: Payer: Self-pay | Admitting: Family Medicine

## 2010-09-25 ENCOUNTER — Ambulatory Visit: Admit: 2010-09-25 | Payer: Self-pay | Admitting: Family Medicine

## 2010-09-25 LAB — URINALYSIS, ROUTINE W REFLEX MICROSCOPIC
Bilirubin Urine: NEGATIVE
Ketones, ur: NEGATIVE mg/dL
Nitrite: NEGATIVE
Specific Gravity, Urine: >1.03 — ABNORMAL HIGH (ref 1.005–1.030)
Urobilinogen, UA: 0.2 mg/dL (ref 0.0–1.0)
pH: 6 (ref 5.0–8.0)

## 2010-09-25 LAB — GC/CHLAMYDIA PROBE AMP, GENITAL: Chlamydia, DNA Probe: NEGATIVE

## 2010-10-03 ENCOUNTER — Encounter: Payer: Self-pay | Admitting: Obstetrics & Gynecology

## 2010-10-03 ENCOUNTER — Ambulatory Visit
Admission: RE | Admit: 2010-10-03 | Discharge: 2010-10-03 | Payer: Self-pay | Source: Home / Self Care | Attending: Obstetrics & Gynecology | Admitting: Obstetrics & Gynecology

## 2010-10-03 LAB — CONVERTED CEMR LAB
Antibody Screen: NEGATIVE
Eosinophils Absolute: 0.2 10*3/uL (ref 0.0–0.7)
Lymphocytes Relative: 17 % (ref 12–46)
Lymphs Abs: 1.4 10*3/uL (ref 0.7–4.0)
MCV: 79.9 fL (ref 78.0–100.0)
Monocytes Relative: 8 % (ref 3–12)
Neutro Abs: 5.8 10*3/uL (ref 1.7–7.7)
Neutrophils Relative %: 72 % (ref 43–77)
RBC: 4.23 M/uL (ref 3.87–5.11)
Rh Type: POSITIVE
Rubella: 309.2 intl units/mL — ABNORMAL HIGH
WBC: 8 10*3/uL (ref 4.0–10.5)

## 2010-10-03 NOTE — Assessment & Plan Note (Signed)
Summary: arm pain/[place]./[name]   Vital Signs:  Patient profile:   40 year old female Menstrual status:  regular Height:      62.5 inches Weight:      162 pounds BMI:     29.26 Temp:     97.7 degrees F oral Pulse rate:   96 / minute BP sitting:   128 / 82  (left arm) Cuff size:   regular  Vitals Entered By: Tessie Fass CMA (December 19, 2009 3:57 PM) CC: right arm pain x 3 months Is Patient Diabetic? No Pain Assessment Patient in pain? yes     Location: right arm Intensity: 8   Primary Care Provider:  Zachery Dauer MD  CC:  right arm pain x 3 months.  History of Present Illness: The right elbow pain improved after the injection the end of Feb by Dr Jennette Kettle in [facility], but returned the past 2 weeks and keeps her awake at night. Unable to move the arm much or lift it above her head.   Around March 21 she went to [facility] for severe pelvic pain. CBC was normal except platelets of 110K. Pregnancy test was negative as were GC/Chlamydia. She had resolution of the pain with 3 days of heavy menses and no bleeding since. Rx was Ibuprofen 800 mg two times a day   She says she called when she knew she couldn't made the lab appointment. Would like to reschedule it for Friday of this week.   Otherwise feeling well and not having headaches. Taking her medications as prescribed  Habits & Providers  Alcohol-Tobacco-Diet     Tobacco Status: never  Allergies: No Known Drug Allergies  Physical Exam  Extremities:  No clubbing, cyanosis, edema, or deformity noted with normal full range of motion of all joints.     Shoulder/Elbow Exam  Elbow Exam:    Right:    Inspection:  Abnormal       Location:  right lateral epicondyle    Stability:  stable    Tenderness:  right lateral epicondyle    Swelling:  right lateral epicondyle    Erythema:  no    Pain worse with wrist extension and supination   Impression & Recommendations:  Problem # 1:  LATERAL EPICONDYLITIS, RIGHT  (ICD-726.32)  Reinjected. Warned about hypopigmentation. Moving much better after the injection. I will call with lab results to determine response.   Orders: Injection, intermediate joint - [facility] (20605)  Complete Medication List: 1)  Synthroid 200 Mcg Tabs (Levothyroxine sodium) .... Take 1 tablet daily 2)  Synthroid 50 Mcg Tabs (Levothyroxine sodium) .... Take 1 tablet daily 3)  Trandate 200 Mg Tabs (Labetalol hcl) .... Take 2 tabs two times a day 4)  Aspir-low 81 Mg Tbec (Aspirin) .... Take one tablet daily 5)  Acetaminophen 500 Mg Caps (Acetaminophen) .... Take 2 tabs three times a day prn 6)  Voltaren 1 % Gel (Diclofenac sodium) .... Apply to aa three to four times a day, large op  Patient Instructions: 1)  Evite el uso del brazo derecho para levatar o torcer la mano con fuerzo. Continue los ejercicios.  2)  Regrese viernes en ayunas para las pruebas de sangre 3)  Return Friday AM for the fasting lab tests.   Procedure Note Last Tetanus: Done. (12/02/2001)  Injections: Duration of symptoms: 2 months Indication: chronic pain Consent signed: no  Procedure # 1: trigger point injection    Region: lateral    Location: right forearm  Technique: 24 g 1' needle    Medication: Triamcinolone 10 mg/mL    Anesthesia: 2.0 ml 0.5% marcaine  Cleaned and prepped with: alcohol Wound dressing: bandaid Additional Instructions: Resume exercises without resistance

## 2010-10-03 NOTE — Assessment & Plan Note (Signed)
Summary: F/U HTN/elbow pain.   Vital Signs:  Patient profile:   40 year old female Menstrual status:  regular Height:      62.5 inches Weight:      163.13 pounds BMI:     29.47 Temp:     98.1 degrees F Pulse rate:   82 / minute BP sitting:   122 / 80  (left arm)  Vitals Entered By: Terese Door (October 05, 2009 2:06 PM) CC: F/U HTN/ C/O RIGHT SHOULDER PAIN/ARM GOING NUMB Is Patient Diabetic? No Pain Assessment Patient in pain? yes     Location: arm Intensity: 10 Type: sharp   Primary Care Provider:  Zachery Dauer MD  CC:  F/U HTN/ C/O RIGHT SHOULDER PAIN/ARM GOING NUMB.  History of Present Illness: CC:  f/u L elbow pain.  1. tennis elbow - Plz see previous note 08/2009.  Has tried brace for tennis elbow as well as voltaren gel to elbow, only minimally helped.  Also tried oral NSAIDs.  Interested in injection.  Still working at [employer], has not diminished exacerbating repetitive activities.  Also had burn to posterior elbow last week.  2. HTN - changed jobs, less stress now.  ran out of labetalol x 4 days.  but BP good today.  Refilled regardless.  no HA, vision changes, chest pain, tightness, urinary changes, LE swelling.   3. hypothyroid - has been taking synthroid x 7 years.  would like refill.  initially hyperthyroid s/p ablation.  out x 4 days.  working on [employer].  Habits & Providers  Alcohol-Tobacco-Diet     Tobacco Status: never  Current Medications (verified): 1)  Ventolin Hfa 108 (90 Base) Mcg/act Aers (Albuterol Sulfate) .... Take 2 Puffs Every 4hrs Prn 2)  Synthroid 200 Mcg  Tabs (Levothyroxine Sodium) .... Take 1 Tablet Daily 3)  Synthroid 50 Mcg  Tabs (Levothyroxine Sodium) .... Take 1 Tablet Daily 4)  Trandate 200 Mg Tabs (Labetalol Hcl) .... Take 2 Tabs Two Times A Day 5)  Aspir-Low 81 Mg Tbec (Aspirin) .... Take One Tablet Daily 6)  Singulair 10 Mg Tabs (Montelukast Sodium) .... Take One Tablet Daily 7)  Robitussin Dm 100-10  Mg/24ml Syrp (Dextromethorphan-Guaifenesin) .... Take 2 Tsp Q4h As Needed Cough 8)  Acetaminophen 500 Mg Caps (Acetaminophen) .... Take 2 Tabs Three Times A Day Prn 9)  Voltaren 1 % Gel (Diclofenac Sodium) .... Apply To Aa Three To Four Times A Day, Large Op  Allergies (verified): No Known Drug Allergies  Past History:  Past medical, surgical, family and social histories (including risk factors) reviewed for relevance to current acute and chronic problems.  Past Medical History: Reviewed history from 08/18/2007 and no changes required. 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 appendectomy - ruptured - 02/02/1996 Cholecystectomy - 09/04/1995 Ex. Laporotomy - adhesions w/ obst - 03/03/2004 ovarian cystectomy - 02/02/1996  Family History: Reviewed history from 08/02/2008 and no changes required. diabetes - M Hemorrhagic CVA - F  Social History: Reviewed history from 08/19/2008 and no changes required. Married, husband Maureen Ralphs an uninsured Nutritional therapist and his mother Native  of [place], Lived 7 yr in [place], before  [place]  No children,  had ectopic pregnancy in August 2007 Pt working on paperwork for documentation 245e  Non-smoker occ. Alcohol Occupation: works at [employer]  Physical Exam  General:  Well-developed,well-nourished,in no acute distress; alert,appropriate and cooperative throughout examination Msk:  FROM at shoulders.  Right arm with swollen lateral epicondyle and slightly so anterior arm.  No erythema, streaking, warmth of  elbow joint.  Point tenderness at right lateral epicondyle.  Negative yerguson's sign, + seer sign. Pulses:  2+ radial pulses Extremities:  No clubbing, cyanosis, edema, or deformity Skin:  right posterior elbow with several burns, denuded epithelium, healing.  (happened 1 wk ago).  no erythema, pus, drainage.   Impression & Recommendations:  Problem # 1:  LATERAL EPICONDYLITIS, RIGHT (ICD-726.32)  referral to [facility] as failed conservative therapy.  Hopeful for steroid injection.  Orders: [facility]- Est  Level 4 (60454)  Problem # 2:  HYPERTENSION, BENIGN SYSTEMIC (ICD-401.1)  refilled meds x 1 mo, RTC 2-3 wks for f/u.  f/u with PCP. Her updated medication list for this problem includes:    Trandate 200 Mg Tabs (Labetalol hcl) .Marland Kitchen... Take 2 tabs two times a day  Orders: [facility]- Est  Level 4 (99214)  BP today: 122/80 Prior BP: 164/105 (08/22/2009)  Prior 10 Yr Risk Heart Disease: 5 % (08/19/2008)  Labs Reviewed: K+: 4.1 (08/30/2008) Creat: : 0.68 (08/30/2008)   Chol: 225 (08/18/2008)   HDL: 32 (08/18/2008)   LDL: See Comment mg/dL (09/81/1914)   TG: 782 (08/18/2008)  Problem # 3:  HYPOTHYROIDISM, UNSPECIFIED (ICD-244.9) refilled meds.  no sxs today.  check TSH next visit after on meds for 3 wks. Her updated medication list for this problem includes:    Synthroid 200 Mcg Tabs (Levothyroxine sodium) .Marland Kitchen... Take 1 tablet daily    Synthroid 50 Mcg Tabs (Levothyroxine sodium) .Marland Kitchen... Take 1 tablet daily  Labs Reviewed: TSH: 0.017 (11/01/2008)    Chol: 225 (08/18/2008)   HDL: 32 (08/18/2008)   LDL: See Comment mg/dL (95/62/1308)   TG: 657 (08/18/2008)  Complete Medication List: 1)  Ventolin Hfa 108 (90 Base) Mcg/act Aers (Albuterol sulfate) .... Take 2 puffs every 4hrs prn 2)  Synthroid 200 Mcg Tabs (Levothyroxine sodium) .... Take 1 tablet daily 3)  Synthroid 50 Mcg Tabs (Levothyroxine sodium) .... Take 1 tablet daily 4)  Trandate 200 Mg Tabs (Labetalol hcl) .... Take 2 tabs two times a  day 5)  Aspir-low 81 Mg Tbec (Aspirin) .... Take one tablet daily 6)  Singulair 10 Mg Tabs (Montelukast sodium) .... Take one tablet daily 7)  Robitussin Dm 100-10 Mg/64ml Syrp (Dextromethorphan-guaifenesin) .... Take 2 tsp q4h as needed cough 8)  Acetaminophen 500 Mg Caps (Acetaminophen) .... Take 2 tabs three times a day prn 9)  Voltaren 1 % Gel (Diclofenac sodium) .... Apply to aa three to four times a day, large op  Patient Instructions: 1)  Set up with sports medicine clinic for lateral epicondylar steroid injection. 2)  Regresar en 2-3 semanas para ver como sigue el codo, y para seguimiento de su presion alta. 3)  Le he rellenado medicamentos para un mes. 4)  Voltaren gel al codo diariamente.  hielo al codo tambien. 5)  Trate de prevenir acciones repeptitivas para que se mejore mas rapido el codo. 6)  Llame a clinica con preguntas. Prescriptions: VOLTAREN 1 % GEL (DICLOFENAC SODIUM) apply to aa three to four times a day, large OP  #1 x 0  Entered and Authorized by:   Eustaquio Boyden  MD   Signed by:   Eustaquio Boyden  MD on 10/05/2009   Method used:   Print then Give to Patient   RxID:   [id number] TRANDATE 200 MG TABS (LABETALOL HCL) Take 2 tabs two times a day  #120 x 0   Entered and Authorized by:   Eustaquio Boyden  MD   Signed by:   Eustaquio Boyden  MD on 10/05/2009   Method used:   Print then Give to Patient   RxID:   [id number] SYNTHROID 50 MCG  TABS (LEVOTHYROXINE SODIUM) Take 1 tablet daily  #30 x 0   Entered and Authorized by:   Eustaquio Boyden  MD   Signed by:   Eustaquio Boyden  MD on 10/05/2009   Method used:   Print then Give to Patient   RxID:   [id number] SYNTHROID 200 MCG  TABS (LEVOTHYROXINE SODIUM) Take 1 tablet daily  #30 x 0   Entered and Authorized by:   Eustaquio Boyden  MD   Signed by:   Eustaquio Boyden  MD on 10/05/2009   Method used:   Print then Give to Patient   RxID:   [id number] TRANDATE 200 MG TABS (LABETALOL HCL)  Take 2 tabs two times a day  #120 x 0   Entered and Authorized by:   Eustaquio Boyden  MD   Signed by:   Eustaquio Boyden  MD on 10/05/2009   Method used:   Electronically to        [facility] [phone]* (retail)       [address]       [place], [place]  [address]       Ph: [phone]       Fax: [phone]   RxID:   [id number] SYNTHROID 50 MCG  TABS (LEVOTHYROXINE SODIUM) Take 1 tablet daily  #30 x 0   Entered and Authorized by:   Eustaquio Boyden  MD   Signed by:   Eustaquio Boyden  MD on 10/05/2009   Method used:   Electronically to        [facility] [phone]* (retail)       [address]       [place], [place]  [address]       Ph: [phone]       Fax: [phone]   RxID:   [id number] SYNTHROID 200 MCG  TABS (LEVOTHYROXINE SODIUM) Take 1 tablet daily  #30 x 0   Entered and Authorized by:   Eustaquio Boyden  MD   Signed by:   Eustaquio Boyden  MD on 10/05/2009   Method used:   Electronically to        [facility] [phone]* (retail)       [address]       [place], [place]  [address]       Ph: [phone]       Fax: [phone]   RxID:   [id number]    Prevention & Chronic Care Immunizations   Influenza vaccine: Not documented    Tetanus booster: 12/02/2001: Done.   Tetanus booster due: 12/03/2011    Pneumococcal vaccine: Not documented  Other Screening   Pap smear: Done.  (11/02/2002)   Pap smear due: 11/02/2003   Smoking status: never  (10/05/2009)  Lipids   Total Cholesterol: 225  (08/18/2008)   LDL: See Comment mg/dL  (00/86/7619)   LDL Direct: Not documented   HDL: 32  (  08/18/2008)   Triglycerides: 530  (08/18/2008)    SGOT (AST): 21  (08/30/2008)   SGPT (ALT): 40  (08/30/2008)   Alkaline phosphatase: 89  (08/30/2008)   Total bilirubin: 0.3  (08/30/2008)  Hypertension   Last Blood Pressure: 122 / 80  (10/05/2009)   Serum creatinine: 0.68  (08/30/2008)   Serum potassium 4.1  (08/30/2008)    Hypertension  flowsheet reviewed?: Yes   Progress toward BP goal: At goal  Self-Management Support :   Personal Goals (by the next clinic visit) :      Personal blood pressure goal: 140/90  (08/22/2009)     Personal LDL goal: 130  (08/22/2009)    Hypertension self-management support: Not documented    Lipid self-management support: Not documented

## 2010-10-03 NOTE — Letter (Signed)
Summary: Results Follow-up Letter  [facility]  [address]   [place], [place] [address]   Phone: [phone]  Fax: [phone]    02/28/2010  [address] [place], [place]  [address]  Dear [name], Patient: [name] Note: All result statuses are Final unless otherwise noted.  Tests: (1) TSH (23280)   TSH                  [H]  16.324 uIU/mL               0.350-4.500     ***Test methodology is 3rd generation TSH***  Ud necesita mas de hormona de tiroides. Tome una tableta de Synthroid 25 mg con una de 200 mg diario.       Document Creation Date: 02/27/2010 11:30 PM _______________________________________________________________________ Cholesterol LDL(Bad cholesterol):   104       Su meta es menos de:      130   _____________________________________________________________  Please call for an appointment Or _Haga una cita para prueba de [employer]. ______________________________________________________ Sincerely,  Zachery Dauer MD [facility]

## 2010-10-03 NOTE — Assessment & Plan Note (Signed)
Summary: STEROID INJ/KH   Vital Signs:  Patient profile:   40 year old female Menstrual status:  regular BP sitting:   170 / 120  Vitals Entered By: Lillia Pauls CMA (October 14, 2009 9:33 AM)  Primary Care Provider:  Zachery Dauer MD   History of Present Illness: husband is helping translate---she understands English better than she speaks it according to her.  right elbow pain x 4 months. no known acute injury. 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 Pain started slowly and now worsened to point she is having difficulty working. Pain esp bad if she lifts something heavy  and tries to flip it over. No numbness or tingling in hand. Is recovering from a grease bur to that elbow that occurred 2 weeks ago.  Allergies: No Known Drug Allergies  Physical Exam  General:  alert and well-developed.   Msk:  right lateral epicondylar area is extremely tender to palpation and this reproduces her pain. Resisted long fonger extension also increased her pain. N0rmal grip strength. Distally NV intact Skin:  healing burn with 2 small open areas on elbow.   Impression & Recommendations:  Problem # 1:  LATERAL EPICONDYLITIS, RIGHT (ICD-726.32)  Orders: Pneumatic Arm Band (N6295) will start supination exercises with a can. Arm brace. Ibuprofen if she can tolerate oral NSAIDS. Ice at bedtime 15 minutes. Will rtc 1 weeks for consideration of steroid injection--I do not wantto consider that while she has healing but still open skin lesions. She seems to understand this plan and agrees.  Complete Medication List: 1)  Ventolin Hfa 108 (90 Base) Mcg/act Aers (Albuterol sulfate) .... Take 2 puffs every 4hrs prn 2)  Synthroid 200 Mcg Tabs (Levothyroxine sodium) .... Take 1 tablet daily 3)  Synthroid 50 Mcg Tabs (Levothyroxine sodium) .... Take 1 tablet daily 4)  Trandate 200 Mg Tabs (Labetalol hcl) .... Take 2 tabs two times a day 5)  Aspir-low  81 Mg Tbec (Aspirin) .... Take one tablet daily 6)  Singulair 10 Mg Tabs (Montelukast sodium) .... Take one tablet daily 7)  Robitussin Dm 100-10 Mg/2ml Syrp (Dextromethorphan-guaifenesin) .... Take 2 tsp q4h as needed cough 8)  Acetaminophen 500 Mg Caps (Acetaminophen) .... Take 2 tabs three times a day prn 9)  Voltaren 1 % Gel (Diclofenac sodium) .... Apply to aa three to four times a day, large op

## 2010-10-03 NOTE — Miscellaneous (Signed)
Summary: walk in  Clinical Lists Changes came in c/o HA, n&v since yesterday. took tylenol w/o relief. temp 98.2, BP 169/115. states she is taking her bp meds. p=80, 164 #, 62.5 inches. interpretor arranged and placed in wi schedule.Marland KitchenMarland KitchenGolden Circle RN  March 08, 2010 1:50 PM

## 2010-10-03 NOTE — Assessment & Plan Note (Signed)
Summary: wants meds/[place]   Vital Signs:  Patient profile:   40 year old female Menstrual status:  regular Height:      62.5 inches Weight:      164 pounds BMI:     29.62 Temp:     98.6 degrees F oral Pulse rate:   99 / minute BP sitting:   154 / 88  (right arm) Cuff size:   regular  Vitals Entered By: Tessie Fass CMA (April 11, 2010 3:39 PM)  CC: needs meds   Primary Care Provider:  Zachery Dauer MD  CC:  needs meds.  History of Present Illness: Here for renewal of medications. Has been having wheezing recently at night with cough.   Continued right elbow pain mentioned at end.   Interview conducted in [place].   Allergies: No Known Drug Allergies  Physical Exam  General:  Well-developed,well-nourished,in no acute distress; alert,appropriate and cooperative throughout examination   Nose:  External nasal examination shows no deformity or inflammation. Nasal mucosa are pink and moist without lesions or exudates. Mouth:  Oral mucosa and oropharynx without lesions or exudates.  Teeth in good repair. Lungs:  Normal respiratory effort, chest expands symmetrically. Lungs are clear to auscultation, no crackles or wheezes. Heart:  Normal rate and regular rhythm. S1 and S2 normal without gallop, murmur, click, rub or other extra sounds. Psych:  dysphoric affect.     Impression & Recommendations:  Problem # 1:  ASTHMA, UNSPECIFIED (ICD-493.90) By history but no definite signs on exam. Will restart Singulair and recheck Peak flow in 2 weeks.  Her updated medication list for this problem includes:    Singulair 10 Mg Tabs (Montelukast sodium) .Marland Kitchen... Take one tab every at bedtime.    Proair Hfa 108 (90 Base) Mcg/act Aers (Albuterol sulfate) .Marland Kitchen... Take 2 puffs every 4 hr as needed  Orders: [facility]- Est Level  3 ([address])  Problem # 2:  HYPERTENSION, BENIGN SYSTEMIC (ICD-401.1) On trandate due to pregnancy desire but not beta selective.  Her updated medication list for this problem  includes:    Trandate 200 Mg Tabs (Labetalol hcl) .Marland Kitchen... Take 2 tabs two times a day    Hydrochlorothiazide 25 Mg Tabs (Hydrochlorothiazide) .Marland Kitchen... Take 1 tab by mouth every morning  Orders: [facility]- Est Level  3 ([address]454)  Problem # 3:  LATERAL EPICONDYLITIS, RIGHT (ICD-726.32) Continues painful despite treatments.   Problem # 4:  OBESITY, NOS (ICD-278.00) Assessment: Unchanged  Complete Medication List: 1)  Synthroid 200 Mcg Tabs (Levothyroxine sodium) .... Take 1 tablet daily 2)  Trandate 200 Mg Tabs (Labetalol hcl) .... Take 2 tabs two times a day 3)  Aspir-low 81 Mg Tbec (Aspirin) .... Take one tablet daily 4)  Acetaminophen 500 Mg Caps (Acetaminophen) .... Take 2 tabs three times a day prn 5)  Voltaren 1 % Gel (Diclofenac sodium) .... Apply to aa three to four times a day, large op 6)  Tylenol With Codeine #3 300-30 Mg Tabs (Acetaminophen-codeine) .... Sig: take 1 tab by mouth every 6 hours as needed for pain 7)  Synthroid 25 Mcg Tabs (Levothyroxine sodium) .... Take one tablet daily 8)  Hydrochlorothiazide 25 Mg Tabs (Hydrochlorothiazide) .... Take 1 tab by mouth every morning 9)  Hydrocodone-acetaminophen 5-500 Mg Tabs (Hydrocodone-acetaminophen) .... One by mouth q4h 10)  Singulair 10 Mg Tabs (Montelukast sodium) .... Take one tab every at bedtime. 11)  Proair Hfa 108 (90 Base) Mcg/act Aers (Albuterol sulfate) .... Take 2 puffs every 4 hr as needed  Other Orders: TSH-[facility] ([id number])  Patient Instructions: 1)  Please schedule a follow-up appointment in 2 weeks.  Prescriptions: PROAIR HFA 108 (90 BASE) MCG/ACT AERS (ALBUTEROL SULFATE) Take 2 puffs every 4 hr as needed  #1 x 11   Entered and Authorized by:   Zachery Dauer MD   Signed by:   Zachery Dauer MD on 04/11/2010   Method used:   Electronically to        [facility] [phone]* (retail)       [address]       [place], [place]  [address]       Ph: [phone]       Fax: [phone]   RxID:   [id number]

## 2010-10-03 NOTE — Assessment & Plan Note (Signed)
Summary: swollen gum/ mj   Vital Signs:  Patient profile:   40 year old female Menstrual status:  regular Weight:      164.9 pounds Temp:     96.7 degrees F oral Pulse rate:   76 / minute BP sitting:   163 / 94  (left arm) Cuff size:   regular  Vitals Entered By: Arlyss Repress CMA, (Jan 27, 2010 3:36 PM) CC: swollen painful gum Is Patient Diabetic? No Pain Assessment Patient in pain? yes     Location: gum Intensity: 6   Primary Care Provider:  Zachery Dauer MD  CC:  swollen painful gum.  History of Present Illness: Visit conducted in [place].  Patient with complaint of R upper and lower molar pain for the past 3 weeks. She had a crown on her R lower posterior-most molar which came out.  Since then it has been painful.  Last dentist visit 8 years ago.  Lacks resources for dental care, she states.   Denies fevers or chills.  Able to take by mouth, using L side of mouth to chew.  Takes ibuprofen 800mg  three times dailywith little relief.    Also of note, recent TSH value that is low.  Reviewed with her.  She is taking her LT4 at daily, not missing doses or taking more than is prescribed.    Habits & Providers  Alcohol-Tobacco-Diet     Tobacco Status: never  Current Medications (verified): 1)  Synthroid 200 Mcg  Tabs (Levothyroxine Sodium) .... Take 1 Tablet Daily 2)  Trandate 200 Mg Tabs (Labetalol Hcl) .... Take 2 Tabs Two Times A Day 3)  Aspir-Low 81 Mg Tbec (Aspirin) .... Take One Tablet Daily 4)  Acetaminophen 500 Mg Caps (Acetaminophen) .... Take 2 Tabs Three Times A Day Prn 5)  Voltaren 1 % Gel (Diclofenac Sodium) .... Apply To Aa Three To Four Times A Day, Large Op 6)  Tylenol With Codeine #3 300-30 Mg Tabs (Acetaminophen-Codeine) .... Sig: Take 1 Tab By Mouth Every 6 Hours As Needed For Pain  Allergies (verified): No Known Drug Allergies  Physical Exam  General:  well appearing, no apparent distress.  Eyes:  clear sclerae Mouth:  Moist mucus membranes,  clear oropharynx. Extremely poor dentition.  R side superior and inferior molars in poor repair, without gingival erythema or purulence.  No abscess appreciated by my inspection.  R posteriormost inferior molar not aligned with neighboring tooth. Able to open mouth without difficulty. No trismus. Neck:  neck supple. No appreciable anterior cervical adenopathy.  Thyroid supple.    Impression & Recommendations:  Problem # 1:  DENTAL PAIN (ICD-525.9) Loss of crown with pain in R upper and lower molars.  Discussed dental referral through [facility] Dept, gave hernumber and we will send a request for immediate attention.  I wrote a letter to this effect, to be sent to [facility].  T#3 for pain, along with ibuprofen for now.   Orders: Dental Referral (Dentist) [facility]- Est Level  3 ([address])  Problem # 2:  HYPOTHYROIDISM, UNSPECIFIED (ICD-244.9) Recent TSH was low; will backoff her LT4 dosing, from to 280mcg/day.  Recheck in 6 weeks. She knows to come back for nonfasting labs in that time for this.  Will contact her with results.   The following medications were removed from the medication list:    Synthroid 50 Mcg Tabs (Levothyroxine sodium) .Marland Kitchen... Take 1 tablet daily Her updated medication list for this problem includes:  Synthroid 200 Mcg Tabs (Levothyroxine sodium) .Marland Kitchen... Take 1 tablet daily  Orders: [facility]- Est Level  3 ([address])Future Orders: [facility] ([id number]) ... 01/10/2011  Complete Medication List: 1)  Synthroid 200 Mcg Tabs (Levothyroxine sodium) .... Take 1 tablet daily 2)  Trandate 200 Mg Tabs (Labetalol hcl) .... Take 2 tabs two times a day 3)  Aspir-low 81 Mg Tbec (Aspirin) .... Take one tablet daily 4)  Acetaminophen 500 Mg Caps (Acetaminophen) .... Take 2 tabs three times a day prn 5)  Voltaren 1 % Gel (Diclofenac sodium) .... Apply to aa three to four times a day, large op 6)  Tylenol With Codeine #3 300-30 Mg Tabs (Acetaminophen-codeine) .... Sig: take 1  tab by mouth every 6 hours as needed for pain  Patient Instructions: 1)  Fue un placer verle hoy.  Estamos refiriendo su caso a los servicios dentales a traves de [place]. 2)  Le estoy recetando un medicamento para el dolor; la puede tomar junto al ibuprofen 800mg  si lo necesita. 3)  Como conversamos hoy, reduzca su dosis de levotiroxina a por dia, y vuelva a chequear esto en 6 semanas.  Ya mande' la orden para [place] manager, Ud puede hacer el examen sin estar en ayunas, a partir de mediados de julio. Prescriptions: TYLENOL WITH CODEINE #3 300-30 MG TABS (ACETAMINOPHEN-CODEINE) SIG: Take 1 tab by mouth every 6 hours as needed for pain  #60 x 0   Entered and Authorized by:   Paula Compton MD   Signed by:   Paula Compton MD on 01/27/2010   Method used:   Print then Give to Patient   RxID:   [id number]

## 2010-10-03 NOTE — Assessment & Plan Note (Signed)
Summary: F/U EL[facility]OW,MC   Vital Signs:  Patient profile:   40 year old female Menstrual status:  regular BP sitting:   128 / 85  Primary Care Provider:  Zachery Dauer MD   History of Present Illness: f/u r lateral epicondulitis. has been wearing elbow band and that is helping some. here today to try steroid injecyion.  Allergies: No Known Drug Allergies  Physical Exam  General:  alert and well-developed.   Msk:  tender over right lateral epicondyle area of previous skin abrasion has totally healed Additional Exam:  Patient given informed consent for injection. Discussed possible complications of infection, bleeding or skin atrophy at site of injection. Possible side effect of avascular necrosis (focal area of bone death) due to steroid use.Appropriate verbal time out taken Are cleaned and prepped in usual sterile fashion. A ---1/2- cc kennalog plus -1 1/2---cc 1% lidocaine without epinephrine was injected into the-right lateral epicodylar area--. Patient tolerated procedure well with no complications.    Impression & Recommendations:  Problem # 1:  LATERAL EPICONDYLITIS, RIGHT (ICD-726.32)  contniue exercises, ice and elbow band. RTC as needed. I would be willing to do one more injection in this area should she need it.  Orders: Joint Aspirate / Injection, Intermediate (81191) Kenalog 10 mg inj (J3301)  Complete Medication List: 1)  Ventolin Hfa 108 (90 Base) Mcg/act Aers (Albuterol sulfate) .... Take 2 puffs every 4hrs prn 2)  Synthroid 200 Mcg Tabs (Levothyroxine sodium) .... Take 1 tablet daily 3)  Synthroid 50 Mcg Tabs (Levothyroxine sodium) .... Take 1 tablet daily 4)  Trandate 200 Mg Tabs (Labetalol hcl) .... Take 2 tabs two times a day 5)  Aspir-low 81 Mg Tbec (Aspirin) .... Take one tablet daily 6)  Singulair 10 Mg Tabs (Montelukast sodium) .... Take one tablet daily 7)  Robitussin Dm 100-10 Mg/45ml Syrp (Dextromethorphan-guaifenesin) .... Take 2 tsp q4h as needed  cough 8)  Acetaminophen 500 Mg Caps (Acetaminophen) .... Take 2 tabs three times a day prn 9)  Voltaren 1 % Gel (Diclofenac sodium) .... Apply to aa three to four times a day, large op

## 2010-10-03 NOTE — Miscellaneous (Signed)
Summary: pain control  Clinical Lists Changes per Helmut Muster, our interpretor, pt is c/o pain & wants something else ordered. has appt here Monday. Helmut Muster did not know if pt was taking the tylenol as ordered or using the voltaren gel. she does not want an appt today. just stronger meds. I asked Helmut Muster to call her back & make sure she was takinh meds as ordered. to pcp . Marland KitchenGolden Circle RN  October 26, 2009 11:17 AM  Observations: Added new observation of DM PROGRESS: N/A (10/26/2009 11:15) Added new observation of DM FSREVIEW: N/A (10/26/2009 11:15)      Prevention & Chronic Care Immunizations   Influenza vaccine: Not documented    Tetanus booster: 12/02/2001: Done.   Tetanus booster due: 12/03/2011    Pneumococcal vaccine: Not documented  Other Screening   Pap smear: Done.  (11/02/2002)   Pap smear due: 11/02/2003   Smoking status: never  (10/05/2009)  Lipids   Total Cholesterol: 225  (08/18/2008)   LDL: See Comment mg/dL  (11/91/4782)   LDL Direct: Not documented   HDL: 32  (08/18/2008)   Triglycerides: 530  (08/18/2008)    SGOT (AST): 21  (08/30/2008)   SGPT (ALT): 40  (08/30/2008)   Alkaline phosphatase: 89  (08/30/2008)   Total bilirubin: 0.3  (08/30/2008)  Hypertension   Last Blood Pressure: 170 / 120  (10/14/2009)   Serum creatinine: 0.68  (08/30/2008)   Serum potassium 4.1  (08/30/2008)  Self-Management Support :   Personal Goals (by the next clinic visit) :      Personal blood pressure goal: 140/90  (08/22/2009)     Personal LDL goal: 130  (08/22/2009)    Hypertension self-management support: Not documented    Lipid self-management support: Not documented     Allergies: No Known Drug Allergies

## 2010-10-03 NOTE — Letter (Signed)
Summary: Probation Letter  [facility]  [address]   [place], [place] [address]   Phone: [phone]  Fax: [phone]    12/13/2009  Randell Patient [place] [address] [place], [place]  [address]  Dear Ms. SAUCEDO-Zern,  With the goal of better serving all our patients the [facility] is following each patient's missed appointments.  You have missed at least 3 appointments with our practice.If you cannot keep your appointment, we expect you to call at least 24 hours before your appointment time.  Missing appointments prevents other patients from seeing [place] and makes it difficult to provide you with the best possible medical care.      1.   If you miss one more appointment, we will only give you limited medical services. This means we will not call in medication refills, complete a form, or make a referral for you except when you are here for a scheduled office visit.    2.   If you miss 2 or more appointments in the next year, we will dismiss you from our practice.    Our office staff can be reached at [phone] Monday through Friday from 8:30 a.m.-5:00 p.m. and will be glad to schedule your appointment as necessary.    Thank you.   [facility]  Appended Document: Probation Letter cert mailed

## 2010-10-03 NOTE — Assessment & Plan Note (Signed)
Summary: F/U/KH   Vital Signs:  Patient profile:   40 year old female Menstrual status:  regular Height:      62.5 inches Weight:      165.6 pounds BMI:     29.91 BP sitting:   126 / 80  (left arm) Cuff size:   regular  Vitals Entered By: Jimmy Footman, CMA (February 27, 2010 2:28 PM) CC: rt arm pain Is Patient Diabetic? No Pain Assessment Patient in pain? yes     Location: arm Intensity: 7 Type: aching Onset of pain  x2 weeks   Primary Care Provider:  Zachery Dauer MD  CC:  rt arm pain.  History of Present Illness: Past 2 weeks her right elbow has been hurting again and keeping her awake. No overuse, not currently working  Her husband has a job Furniture conservator/restorer ducts and expects to get medical and dental insurance starting July 1. She'll lose her [employer] coverage then. Her tooth is still hurting and she'll make a dental appointment as soon as she has coverage.  She feels tireder on the lower dose of Synthroid  No recent headaches   Exercising walking 30 - 60 minutes 3 times weekly.    Habits & Providers  Alcohol-Tobacco-Diet     Tobacco Status: never  Allergies: No Known Drug Allergies  Physical Exam  General:  well appearing, no apparent distress.  Mouth:  Moist mucus membranes, clear oropharynx. Extremely poor dentition.  R side superior and inferior molars in poor repair, without gingival erythema or purulence.  No abscess appreciated by my inspection.  R posteriormost inferior molar not aligned with neighboring tooth. Able to open mouth without difficulty. No trismus. Msk:  No deformity or scoliosis noted of thoracic or lumbar spine.     Shoulder/Elbow Exam  Elbow Exam:    Right:    Inspection:  Normal    Palpation:  Abnormal       Location:  right lateral epicondyle    Stability:  stable    Tenderness:  right lateral epicondyle    Swelling:  no    Erythema:  no    Full range of motion, but pain lateral epicondyle with resisted supination and wrist  dorsiflexion   Impression & Recommendations:  Problem # 1:  LATERAL EPICONDYLITIS, RIGHT (ICD-726.32) Reviewed exercises in Spanish. Too soon for reinjection. Discussed nitroglycerine patch to be applied over elbow pain. Concern is that it will start headache  Orders: [facility]- Est  Level 4 ([address])  Problem # 2:  DENTAL PAIN (ICD-525.9) Tylenol with codeine until she can see a dentist Orders: [facility]- Est  Level 4 ([address])  Problem # 3:  HYPOTHYROIDISM, UNSPECIFIED (ICD-244.9)  Her updated medication list for this problem includes:    Synthroid 200 Mcg Tabs (Levothyroxine sodium) .Marland Kitchen... Take 1 tablet daily  Orders: [facility]- Est  Level 4 ([address]) [facility] ([id number])  Problem # 4:  HYPERTENSION, BENIGN SYSTEMIC (ICD-401.1) Assessment: Improved  Her updated medication list for this problem includes:    Trandate 200 Mg Tabs (Labetalol hcl) .Marland Kitchen... Take 2 tabs two times a day  Orders: [facility]- Est  Level 4 ([address])  Problem # 5:  HYPERLIPIDEMIA (ICD-272.4)  Orders: Direct LDL-[facility] ([id number])  Complete Medication List: 1)  Synthroid 200 Mcg Tabs (Levothyroxine sodium) .... Take 1 tablet daily 2)  Trandate 200 Mg Tabs (Labetalol hcl) .... Take 2 tabs two times a day 3)  Aspir-low 81 Mg Tbec (Aspirin) .... Take one tablet daily 4)  Acetaminophen 500 Mg Caps (  Acetaminophen) .... Take 2 tabs three times a day prn 5)  Voltaren 1 % Gel (Diclofenac sodium) .... Apply to aa three to four times a day, large op 6)  Tylenol With Codeine #3 300-30 Mg Tabs (Acetaminophen-codeine) .... Sig: take 1 tab by mouth every 6 hours as needed for pain  Patient Instructions: 1)  Haga los ejercicios para el codo. Llamame para una receta para pache de nitroglyerina o para regresar a la clinica de deportes.  2)  Haga una cita con la dentista  Prescriptions: TYLENOL WITH CODEINE #3 300-30 MG TABS (ACETAMINOPHEN-CODEINE) SIG: Take 1 tab by mouth every 6 hours as needed for pain  #60 x 0   Entered and Authorized by:    Zachery Dauer MD   Signed by:   Zachery Dauer MD on 02/27/2010   Method used:   Print then Give to Patient   RxID:   [id number]    Prevention & Chronic Care Immunizations   Influenza vaccine: Not documented    Tetanus booster: 12/02/2001: Done.   Tetanus booster due: 12/03/2011    Pneumococcal vaccine: Not documented  Other Screening   Pap smear: Interpretation/Result:Negative for intraepithelial Lesion or Malignancy. Per patient     (11/01/2008)   Pap smear due: 11/02/2003   Smoking status: never  (02/27/2010)  Lipids   Total Cholesterol: 225  (12/22/2009)   LDL: 143  (12/22/2009)   LDL Direct: Not documented   HDL: 38  (12/22/2009)   Triglycerides: 220  (12/22/2009)    SGOT (AST): 16  (12/22/2009)   SGPT (ALT): 29  (12/22/2009)   Alkaline phosphatase: 90  (12/22/2009)   Total bilirubin: 0.3  (12/22/2009)    Lipid flowsheet reviewed?: Yes   Progress toward LDL goal: Unchanged  Hypertension   Last Blood Pressure: 126 / 80  (02/27/2010)   Serum creatinine: 0.70  (12/22/2009)   Serum potassium 3.9  (12/22/2009)    Hypertension flowsheet reviewed?: Yes   Progress toward BP goal: At goal  Self-Management Support :   Personal Goals (by the next clinic visit) :      Personal blood pressure goal: 140/90  (08/22/2009)     Personal LDL goal: 130  (08/22/2009)    Hypertension self-management support: Not documented    Lipid self-management support: Not documented

## 2010-10-03 NOTE — Progress Notes (Signed)
Summary: Labetalol dose verification  Phone Note From Other Clinic   Caller: French Ana @ [facility] [phone] Summary of Call: French Ana calling to verify patients Labetalol dose.  States pt. is pregnant and requesting  a refill and just needs to know her dose. Left message on Tracy's voicemail that [name]'s Labetalol dose in 200mg , take two tablets two times a day. Initial call taken by: Terese Door,  July 19, 2010 11:30 AM

## 2010-10-03 NOTE — Miscellaneous (Signed)
Summary: asthma classification  Clinical Lists Changes  Problems: Changed problem from ASTHMA, UNSPECIFIED (ICD-493.90) to ASTHMA, PERSISTENT (ICD-493.90) 

## 2010-10-03 NOTE — Assessment & Plan Note (Signed)
Summary: PHYSICAL/MC   Vital Signs:  Patient profile:   40 year old female Menstrual status:  regular Height:      62.5 inches Weight:      164 pounds BMI:     29.62 BSA:     1.77 Temp:     97.9 degrees F Pulse rate:   86 / minute BP sitting:   138 / 87  Vitals Entered By: Jone Baseman CMA (October 31, 2009 2:06 PM) CC: physical Is Patient Diabetic? No Pain Assessment Patient in pain? no        Primary Care Provider:  Zachery Dauer MD  CC:  physical.  History of Present Illness: Has been feeling well except for severe pain in her right elbow which was injected this P.M. by Dr Jennette Kettle at [facility]. Quit her job at [employer] due to the pain  She denies headache since I last saw her. She continues Labetalol and is reluctant to change it though it costs her $70 per month at [facility].   She has been taking her levothyroid 200 + 50 daily.   Had a pap smear at the time of her miscarriage at [facility] hospital  No recent asthma symptoms   Menses regular x 4-5 days. Last 5 months she's had low back pain with her menstrual colic.   She recently renewed her orange card through [facility].   Habits & Providers  Alcohol-Tobacco-Diet     Tobacco Status: never  Current Medications (verified): 1)  Synthroid 200 Mcg  Tabs (Levothyroxine Sodium) .... Take 1 Tablet Daily 2)  Synthroid 50 Mcg  Tabs (Levothyroxine Sodium) .... Take 1 Tablet Daily 3)  Trandate 200 Mg Tabs (Labetalol Hcl) .... Take 2 Tabs Two Times A Day 4)  Aspir-Low 81 Mg Tbec (Aspirin) .... Take One Tablet Daily 5)  Acetaminophen 500 Mg Caps (Acetaminophen) .... Take 2 Tabs Three Times A Day Prn 6)  Voltaren 1 % Gel (Diclofenac Sodium) .... Apply To Aa Three To Four Times A Day, Large Op  Allergies (verified): No Known Drug Allergies  Physical Exam  General:  alert, well-developed, and well-nourished.   Head:  Normocephalic and atraumatic without obvious abnormalities. No apparent alopecia or balding. Eyes:  No  corneal or conjunctival inflammation noted. EOMI. Perrla. Funduscopic exam benign, without hemorrhages, exudates or papilledema. Vision grossly normal. Ears:  External ear exam shows no significant lesions or deformities.  Otoscopic examination reveals clear canals, tympanic membranes are intact bilaterally without bulging, retraction, inflammation or discharge. Hearing is grossly normal bilaterally. Nose:  External nasal examination shows no deformity or inflammation. Nasal mucosa are pink and moist without lesions or exudates. Mouth:  Oral mucosa and oropharynx without lesions or exudates.  Teeth in good repair. Neck:  No deformities, masses, or tenderness noted. No thyromegaly Lungs:  Normal respiratory effort, chest expands symmetrically. Lungs are clear to auscultation, no crackles or wheezes. Heart:  Normal rate and regular rhythm. S1 and S2 normal without gallop, murmur, click, rub or other extra sounds. Abdomen:  Bowel sounds positive,abdomen soft and non-tender without masses, organomegaly or hernias noted. Msk:  No deformity or scoliosis noted of thoracic or lumbar spine.   Extremities:  No clubbing, cyanosis, edema, or deformity noted with normal full range of motion of all joints.   Neurologic:  No cranial nerve deficits noted. Station and gait are normal.DTRs are symmetrical throughout. Sensory, motor and coordinative functions appear intact. Skin:  Intact without suspicious lesions or rashes Psych:  dysphoric affect ?due to  pain in her elbow   Impression & Recommendations:  Problem # 1:  HYPOTHYROIDISM, UNSPECIFIED (ICD-244.9)  Return for TSH Her updated medication list for this problem includes:    Synthroid 200 Mcg Tabs (Levothyroxine sodium) .Marland Kitchen... Take 1 tablet daily    Synthroid 50 Mcg Tabs (Levothyroxine sodium) .Marland Kitchen... Take 1 tablet daily  Orders: [facility]- Est  Level 4 (99214)Future Orders: TSH-[facility] ([id number]) ... 10/04/2010  Problem # 2:  HYPERTENSION, BENIGN SYSTEMIC  (ICD-401.1)  New prescription printed for her to try to get more cheaply at [facility].  Her updated medication list for this problem includes:    Trandate 200 Mg Tabs (Labetalol hcl) .Marland Kitchen... Take 2 tabs two times a day  Orders: [facility]- Est  Level 4 (99214)Future Orders: Comp Met[facility] ([id number]) ... 10/04/2010  Problem # 3:  HYPERLIPIDEMIA (ICD-272.4)  Return fasting for FLP  Orders: [facility]- Est  Level 4 (99214)Future Orders: Lipid[facility] ([id number]) ... 10/04/2010  Problem # 4:  OBESITY, NOS (ICD-278.00) Assessment: Unchanged  Orders: [facility]- Est  Level 4 (62952)  Problem # 5:  MIGRAINE HEADACHE (ICD-346.90) Assessment: Improved  Her updated medication list for this problem includes:    Trandate 200 Mg Tabs (Labetalol hcl) .Marland Kitchen... Take 2 tabs two times a day    Aspir-low 81 Mg Tbec (Aspirin) .Marland Kitchen... Take one tablet daily    Acetaminophen 500 Mg Caps (Acetaminophen) .Marland Kitchen... Take 2 tabs three times a day prn  Complete Medication List: 1)  Synthroid 200 Mcg Tabs (Levothyroxine sodium) .... Take 1 tablet daily 2)  Synthroid 50 Mcg Tabs (Levothyroxine sodium) .... Take 1 tablet daily 3)  Trandate 200 Mg Tabs (Labetalol hcl) .... Take 2 tabs two times a day 4)  Aspir-low 81 Mg Tbec (Aspirin) .... Take one tablet daily 5)  Acetaminophen 500 Mg Caps (Acetaminophen) .... Take 2 tabs three times a day prn 6)  Voltaren 1 % Gel (Diclofenac sodium) .... Apply to aa three to four times a day, large op  Other Orders: Future Orders: CBC[facility] (84132) ... 10/04/2010  Patient Instructions: 1)  Please schedule a follow-up appointment in 3 months .  2)  Regrese en 3 meses para chequeo de presion.  3)  Please return for a FASTING Lipid Profile soon.  4)  Regrese temprano en ayunas para prueba de colesterol y tiroides.  Prescriptions: TRANDATE 200 MG TABS (LABETALOL HCL) Take 2 tabs two times a day  #180 x 11   Entered and Authorized by:   Zachery Dauer MD   Signed by:   Zachery Dauer MD on 10/31/2009    Method used:   Print then Give to Patient   RxID:   [id number] SYNTHROID 50 MCG  TABS (LEVOTHYROXINE SODIUM) Take 1 tablet daily  #30 x 3   Entered and Authorized by:   Zachery Dauer MD   Signed by:   Zachery Dauer MD on 10/31/2009   Method used:   Print then Give to Patient   RxID:   [id number] SYNTHROID 200 MCG  TABS (LEVOTHYROXINE SODIUM) Take 1 tablet daily  #30 x 3   Entered and Authorized by:   Zachery Dauer MD   Signed by:   Zachery Dauer MD on 10/31/2009   Method used:   Print then Give to Patient   RxID:   [id number]    Prevention & Chronic Care Immunizations   Influenza vaccine: Not documented    Tetanus booster: 12/02/2001: Done.   Tetanus booster due: 12/03/2011    Pneumococcal vaccine: Not documented  Other Screening  Pap smear: Interpretation/Result:Negative for intraepithelial Lesion or Malignancy. Per patient     (11/01/2008)   Pap smear due: 11/02/2003   Smoking status: never  (10/31/2009)  Lipids   Total Cholesterol: 225  (08/18/2008)   LDL: See Comment mg/dL  (04/54/0981)   LDL Direct: Not documented   HDL: 32  (08/18/2008)   Triglycerides: 530  (08/18/2008)    SGOT (AST): 21  (08/30/2008)   SGPT (ALT): 40  (08/30/2008) CMP ordered    Alkaline phosphatase: 89  (08/30/2008)   Total bilirubin: 0.3  (08/30/2008)    Lipid flowsheet reviewed?: Yes   Progress toward LDL goal: Unchanged  Hypertension   Last Blood Pressure: 138 / 87  (10/31/2009)   Serum creatinine: 0.68  (08/30/2008)   Serum potassium 4.1  (08/30/2008) CMP ordered     Hypertension flowsheet reviewed?: Yes   Progress toward BP goal: At goal  Self-Management Support :   Personal Goals (by the next clinic visit) :      Personal blood pressure goal: 140/90  (08/22/2009)     Personal LDL goal: 130  (08/22/2009)    Hypertension self-management support: Not documented    Lipid self-management support: Not documented      Pap Smear  Procedure date:   11/01/2008  Findings:      Interpretation/Result:Negative for intraepithelial Lesion or Malignancy. Per patient      Pap Smear  Procedure date:  11/01/2008  Findings:      Interpretation/Result:Negative for intraepithelial Lesion or Malignancy. Per patient

## 2010-10-03 NOTE — Assessment & Plan Note (Signed)
Summary: HA, n & V x2 days/[place]   Vital Signs:  Patient profile:   40 year old female Menstrual status:  regular Height:      62.5 inches Weight:      164 pounds BMI:     29.62 Temp:     98.2 degrees F Pulse rate:   80 / minute BP sitting:   169 / 115  Vitals Entered By: Golden Circle RN (March 08, 2010 1:50 PM)  Primary Care Provider:  Zachery Dauer MD   History of Present Illness: Head ache for two days.  Hx of migraine induced stroke.   Reviewing BP graph, has labile hypertension.  Currently on trandate. Not on a diuretic.  I cannot discern any contraindications.  Patient has no focal neuro complaints.  Current Medications (verified): 1)  Synthroid 200 Mcg  Tabs (Levothyroxine Sodium) .... Take 1 Tablet Daily 2)  Trandate 200 Mg Tabs (Labetalol Hcl) .... Take 2 Tabs Two Times A Day 3)  Aspir-Low 81 Mg Tbec (Aspirin) .... Take One Tablet Daily 4)  Acetaminophen 500 Mg Caps (Acetaminophen) .... Take 2 Tabs Three Times A Day Prn 5)  Voltaren 1 % Gel (Diclofenac Sodium) .... Apply To Aa Three To Four Times A Day, Large Op 6)  Tylenol With Codeine #3 300-30 Mg Tabs (Acetaminophen-Codeine) .... Sig: Take 1 Tab By Mouth Every 6 Hours As Needed For Pain 7)  Synthroid 25 Mcg Tabs (Levothyroxine Sodium) .... Take One Tablet Daily 8)  Hydrochlorothiazide 25 Mg  Tabs (Hydrochlorothiazide) .... Take 1 Tab By Mouth Every Morning 9)  Hydrocodone-Acetaminophen 5-500 Mg Tabs (Hydrocodone-Acetaminophen) .... One By Mouth Q4h  Allergies (verified): No Known Drug Allergies  Past History:  Past medical, surgical, family and social histories (including risk factors) reviewed, and no changes noted (except as noted below).  Past Medical History: Reviewed history from 08/18/2007 and no changes required. 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 appendectomy - ruptured - 02/02/1996 Cholecystectomy - 09/04/1995 Ex. Laporotomy - adhesions w/ obst - 03/03/2004 ovarian cystectomy - 02/02/1996  Family History: Reviewed history from 08/02/2008 and no changes required. diabetes - M Hemorrhagic CVA - F  Social History: Reviewed history from 10/05/2009 and no changes required. Married, husband Maureen Ralphs an uninsured Nutritional therapist and his mother Native of [place], [place] 7 yr in [place], before  [place]  No children,  had ectopic pregnancy in August 2007 Pt working on paperwork for documentation 245e  Non-smoker occ. Alcohol Occupation: works at [employer]  Physical Exam  General:  Well-developed,well-nourished,in no acute distress; alert,appropriate and cooperative throughout examination  Elevated BP noted Eyes:  nl fundoscopic, no papilledema, hem or exudate Mouth:  Oral mucosa and oropharynx without lesions or exudates.  Teeth in good repair. Neck:  No deformities, masses, or tenderness noted. Lungs:  Normal respiratory effort, chest expands symmetrically. Lungs are clear to auscultation, no crackles or wheezes. Heart:  Normal rate and regular rhythm. S1 and S2 normal without gallop, murmur, click, rub or other extra sounds. Extremities:  no edema Neurologic:  normal exam.   Impression & Recommendations:  Problem # 1:  HYPERTENSION, BENIGN SYSTEMIC (ICD-401.1) Assessment Deteriorated  Up probably because of acute pain.  Reviewing previous numbers, I believe would benefit from HCTZ Her updated medication list for this problem includes:    Trandate 200 Mg Tabs (Labetalol hcl) .Marland Kitchen... Take 2 tabs two times a day    Hydrochlorothiazide 25  Mg Tabs (Hydrochlorothiazide) .Marland Kitchen... Take 1 tab by mouth every morning  Orders: [facility]- Est Level  3 (16109)  Problem # 2:  MIGRAINE HEADACHE (ICD-346.90) Pain meds. Her updated medication list for this problem includes:    Trandate 200 Mg Tabs (Labetalol hcl) .Marland Kitchen... Take 2 tabs two times a day    Aspir-low 81 Mg Tbec (Aspirin) .Marland Kitchen... Take one tablet daily    Acetaminophen 500 Mg Caps (Acetaminophen) .Marland Kitchen... Take 2 tabs three times a day prn    Tylenol With Codeine #3 300-30 Mg Tabs (Acetaminophen-codeine) ..... Sig: take 1 tab by mouth every 6 hours as needed for pain    Hydrocodone-acetaminophen 5-500 Mg Tabs (Hydrocodone-acetaminophen) ..... One by mouth q4h  Orders: Ketorolac-Toradol 15mg  (U0454) [facility]- Est Level  3 (09811)  Complete Medication List: 1)  Synthroid 200 Mcg Tabs (Levothyroxine sodium) .... Take 1 tablet daily 2)  Trandate 200 Mg Tabs (Labetalol hcl) .... Take 2 tabs two times a day 3)  Aspir-low 81 Mg Tbec (Aspirin) .... Take one tablet daily 4)  Acetaminophen 500 Mg Caps (Acetaminophen) .... Take 2 tabs three times a day prn 5)  Voltaren 1 % Gel (Diclofenac sodium) .... Apply to aa three to four times a day, large op 6)  Tylenol With Codeine #3 300-30 Mg Tabs (Acetaminophen-codeine) .... Sig: take 1 tab by mouth every 6 hours as needed for pain 7)  Synthroid 25 Mcg Tabs (Levothyroxine sodium) .... Take one tablet daily 8)  Hydrochlorothiazide 25 Mg Tabs (Hydrochlorothiazide) .... Take 1 tab by mouth every morning 9)  Hydrocodone-acetaminophen 5-500 Mg Tabs (Hydrocodone-acetaminophen) .... One by mouth q4h  Patient Instructions: 1)  FU one week Dr. Sheffield Slider. Prescriptions: HYDROCODONE-ACETAMINOPHEN 5-500 MG TABS (HYDROCODONE-ACETAMINOPHEN) one by mouth q4h  #25 x 0   Entered and Authorized by:   Doralee Albino MD   Signed by:   Doralee Albino MD on 03/08/2010   Method used:   Handwritten   RxID:   [id number] HYDROCHLOROTHIAZIDE 25 MG  TABS (HYDROCHLOROTHIAZIDE) Take 1  tab by mouth every morning  #90 x 3   Entered and Authorized by:   Doralee Albino MD   Signed by:   Doralee Albino MD on 03/08/2010   Method used:   Electronically to        [facility]  [address] [id number] (retail)       [address]       [place], [place]  [address]       Ph: [phone]       Fax: [phone]   RxID:   [id number]    Medication Administration  Injection # 1:    Medication: Ketorolac-Toradol 15mg     Diagnosis: MIGRAINE HEADACHE (ICD-346.90)    Route: IM    Site: LUOQ gluteus    Exp Date: 03/04/2011    Lot #: [id number]    Mfr: [email or web address]    Patient tolerated injection without complications    Given by: Jimmy Footman, CMA (March 08, 2010 2:39 PM)  Orders Added: 1)  Ketorolac-Toradol 15mg  [J1885] 2)  [facility]- Est Level  3 [84166]

## 2010-10-03 NOTE — Letter (Signed)
Summary: Out of Work  [facility]  [address]   [place], [place] [address]   Phone: [phone]  Fax: [phone]    Jan 27, 2010   [name]:  [name] [facility] DOB: [birth year]   To Whom It May Concern:   I write this letter on behalf of [name], whom I had the pleasure of seeing in my office today.  She is suffering from dental caries and severe pain in Right upper and lower molars.  She has lost a crown along her Right lower molar.    I request that she be given prompt attention for her dental needs.   If you need additional information, please feel free to contact our office.         Sincerely,    Paula Compton MD

## 2010-10-05 ENCOUNTER — Encounter: Payer: Self-pay | Admitting: Obstetrics & Gynecology

## 2010-10-05 ENCOUNTER — Other Ambulatory Visit: Payer: Self-pay

## 2010-10-05 DIAGNOSIS — J45909 Unspecified asthma, uncomplicated: Secondary | ICD-10-CM

## 2010-10-05 DIAGNOSIS — O169 Unspecified maternal hypertension, unspecified trimester: Secondary | ICD-10-CM

## 2010-10-05 DIAGNOSIS — O09529 Supervision of elderly multigravida, unspecified trimester: Secondary | ICD-10-CM

## 2010-10-05 DIAGNOSIS — E079 Disorder of thyroid, unspecified: Secondary | ICD-10-CM

## 2010-10-05 DIAGNOSIS — O99280 Endocrine, nutritional and metabolic diseases complicating pregnancy, unspecified trimester: Secondary | ICD-10-CM

## 2010-10-05 NOTE — Assessment & Plan Note (Signed)
Summary: congestion/ls   Vital Signs:  Patient profile:   40 year old female Menstrual status:  regular Height:      62.5 inches Weight:      166.3 pounds BMI:     30.04 Temp:     98.2 degrees F oral Pulse rate:   78 / minute BP sitting:   130 / 80  (right arm) Cuff size:   regular  Vitals Entered By: Arlyss Repress CMA, (August 22, 2010 1:31 PM) CC: cough and congestion x 3 days Is Patient Diabetic? No Pain Assessment Patient in pain? no        Primary Care Provider:  Zachery Dauer MD  CC:  cough and congestion x 3 days.  History of Present Illness: Mainly complains of  nasal congestion and cough at night for the past 3 days. sore throat at first. No headache or chest pain. No nausea, vomiting or diarrhea. Taking no non-prescription medicines. No ill contacts.    Interviewed in [place] with the assistance of Delorise Royals.   Habits & Providers  Alcohol-Tobacco-Diet     Tobacco Status: never  Allergies (verified): No Known Drug Allergies   Impression & Recommendations:  Problem # 1:  UPPER RESPIRATORY INFECTION, ACUTE (ICD-465.9)  Her updated medication list for this problem includes:    Aspir-low 81 Mg Tbec (Aspirin) .Marland Kitchen... Take one tablet daily    Acetaminophen 500 Mg Caps (Acetaminophen) .Marland Kitchen... Take 2 tabs three times a day prn    Guaiatussin Ac 100-10 Mg/23ml Syrp (Guaifenesin-codeine) .Marland Kitchen... Take 1 -2 tsp every 4 hr for cough  Orders: [facility]- Est Level  3 (04540)  Problem # 2:  HYPERTENSION, BENIGN SYSTEMIC (ICD-401.1) Assessment: Improved  Her updated medication list for this problem includes:    Trandate 200 Mg Tabs (Labetalol hcl) .Marland Kitchen... Take 2 tabs two times a day    Hydrochlorothiazide 25 Mg Tabs (Hydrochlorothiazide) .Marland Kitchen... Take 1 tab by mouth every morning  Orders: [facility]- Est Level  3 (98119)  Complete Medication List: 1)  Synthroid 200 Mcg Tabs (Levothyroxine sodium) .... Take 1 tablet daily 2)  Trandate 200 Mg Tabs (Labetalol hcl) .... Take 2 tabs  two times a day 3)  Aspir-low 81 Mg Tbec (Aspirin) .... Take one tablet daily 4)  Acetaminophen 500 Mg Caps (Acetaminophen) .... Take 2 tabs three times a day prn 5)  Voltaren 1 % Gel (Diclofenac sodium) .... Apply to aa three to four times a day, large op 6)  Synthroid 25 Mcg Tabs (Levothyroxine sodium) .... Take one tablet daily 7)  Hydrochlorothiazide 25 Mg Tabs (Hydrochlorothiazide) .... Take 1 tab by mouth every morning 8)  Singulair 10 Mg Tabs (Montelukast sodium) .... Take one tab every at bedtime. 9)  Proair Hfa 108 (90 Base) Mcg/act Aers (Albuterol sulfate) .... Take 2 puffs every 4 hr as needed 10)  Guaiatussin Ac 100-10 Mg/8ml Syrp (Guaifenesin-codeine) .... Take 1 -2 tsp every 4 hr for cough  Patient Instructions: 1)  Compre el generico de Robitussin AC 2)  Continue con agua saline espray muy frequente.  3)  Evite los espray descongestante Prescriptions: GUAIATUSSIN AC 100-10 MG/5ML SYRP (GUAIFENESIN-CODEINE) Take 1 -2 tsp every 4 hr for cough  #4 fl oz x 2   Entered and Authorized by:   Zachery Dauer MD   Signed by:   Zachery Dauer MD on 08/22/2010   Method used:   Print then Give to Patient   RxID:   [id number]    Orders Added: 1)  [facility]- Est  Level  3 [99213]

## 2010-10-06 ENCOUNTER — Ambulatory Visit (HOSPITAL_COMMUNITY)
Admission: RE | Admit: 2010-10-06 | Discharge: 2010-10-06 | Disposition: A | Discharge status: Home / Self Care | Payer: 59 | Source: Ambulatory Visit | Attending: Obstetrics & Gynecology | Admitting: Obstetrics & Gynecology

## 2010-10-06 DIAGNOSIS — O09529 Supervision of elderly multigravida, unspecified trimester: Secondary | ICD-10-CM

## 2010-10-06 DIAGNOSIS — O10019 Pre-existing essential hypertension complicating pregnancy, unspecified trimester: Secondary | ICD-10-CM

## 2010-10-06 DIAGNOSIS — O3430 Maternal care for cervical incompetence, unspecified trimester: Secondary | ICD-10-CM

## 2010-10-06 LAB — CBC
MCH: 25 pg — ABNORMAL LOW (ref 26.0–34.0)
Platelets: 167 10*3/uL (ref 150–400)
RBC: 4.28 MIL/uL (ref 3.87–5.11)
WBC: 9 10*3/uL (ref 4.0–10.5)

## 2010-10-09 LAB — POCT URINALYSIS DIPSTICK
Specific Gravity, Urine: 1.025 (ref 1.005–1.030)
Urine Glucose, Fasting: NEGATIVE mg/dL
Urobilinogen, UA: 0.2 mg/dL (ref 0.0–1.0)

## 2010-10-12 ENCOUNTER — Encounter: Payer: Self-pay | Admitting: Physician Assistant

## 2010-10-12 ENCOUNTER — Other Ambulatory Visit: Payer: Self-pay

## 2010-10-12 DIAGNOSIS — O169 Unspecified maternal hypertension, unspecified trimester: Secondary | ICD-10-CM

## 2010-10-12 DIAGNOSIS — O09529 Supervision of elderly multigravida, unspecified trimester: Secondary | ICD-10-CM

## 2010-10-12 DIAGNOSIS — O99280 Endocrine, nutritional and metabolic diseases complicating pregnancy, unspecified trimester: Secondary | ICD-10-CM

## 2010-10-12 DIAGNOSIS — E079 Disorder of thyroid, unspecified: Secondary | ICD-10-CM

## 2010-10-12 DIAGNOSIS — J45909 Unspecified asthma, uncomplicated: Secondary | ICD-10-CM

## 2010-10-12 LAB — POCT URINALYSIS DIPSTICK
Bilirubin Urine: NEGATIVE
Hgb urine dipstick: NEGATIVE
Ketones, ur: NEGATIVE mg/dL
Specific Gravity, Urine: >=1.03 (ref 1.005–1.030)
Urine Glucose, Fasting: NEGATIVE mg/dL

## 2010-10-12 LAB — CONVERTED CEMR LAB
Free Thyroxine Index: 3.3 (ref 1.0–3.9)
T3 Uptake Ratio: 22.2 % — ABNORMAL LOW (ref 22.5–37.0)
T4, Total: 14.8 ug/dL — ABNORMAL HIGH (ref 5.0–12.5)

## 2010-10-14 ENCOUNTER — Inpatient Hospital Stay (INDEPENDENT_AMBULATORY_CARE_PROVIDER_SITE_OTHER)
Admission: RE | Admit: 2010-10-14 | Discharge: 2010-10-14 | Disposition: A | Discharge status: Home / Self Care | Payer: 59 | Source: Ambulatory Visit

## 2010-10-14 DIAGNOSIS — J069 Acute upper respiratory infection, unspecified: Secondary | ICD-10-CM

## 2010-10-19 ENCOUNTER — Other Ambulatory Visit: Payer: Self-pay | Admitting: Obstetrics & Gynecology

## 2010-10-19 ENCOUNTER — Other Ambulatory Visit: Payer: Self-pay

## 2010-10-19 DIAGNOSIS — O169 Unspecified maternal hypertension, unspecified trimester: Secondary | ICD-10-CM

## 2010-10-19 DIAGNOSIS — Z3689 Encounter for other specified antenatal screening: Secondary | ICD-10-CM

## 2010-10-19 LAB — POCT URINALYSIS DIPSTICK
Nitrite: NEGATIVE
Urine Glucose, Fasting: NEGATIVE mg/dL
Urobilinogen, UA: 0.2 mg/dL (ref 0.0–1.0)

## 2010-10-19 NOTE — Op Note (Signed)
  NAME:  BAANI, BOBER       ACCOUNT NO.:  [id number]  MEDICAL RECORD NO.:  [id number]           PATIENT TYPE:  O  LOCATION:  [facility]                          [facility]ACILITY:  [facility]  PHYSICIAN:  Allie Bossier, MD        DATE OF BIRTH:  05-04-1971  DATE OF PROCEDURE: DATE OF DISCHARGE:                              OPERATIVE REPORT   PREOPERATIVE DIAGNOSES:  Incompetent cervix at 13 weeks' estimated gestational age, chronic hypertension.  POSTOPERATIVE DIAGNOSES:  Incompetent cervix at 16 weeks' estimated gestational age, chronic hypertension.  PROCEDURE:  McDonald cerclage.  SURGEON:  Allie Bossier, MD.  ANESTHESIA:  Spinal, Quillian Quince, M.D.  COMPLICATIONS:  None.  ESTIMATED BLOOD LOSS:  Minimal.  SPECIMENS:  None.  FINDINGS:  Normal-appearing cervix and normal fetal heart beat.  DETAIL PROCEDURE AND FINDINGS:  Risks, benefits, alternatives of surgery were explained, understood, and accepted.  Consents were signed and the services of an interpreter were employed, all questions were answered. She was given a gram of IV Ancef.  In the operating room, spinal anesthesia was applied without complication.  She was placed in dorsal lithotomy position.  Her vagina was prepped and draped in usual sterile fashion.  Her bladder was emptied with a Robinson catheter.  A weighted speculum was placed posteriorly and a Deaver anteriorly.  The cervix was grasped with long pickups with teeth and a 5-mm Mersilene band was placed in a pursestring fashion around the cervix.  The knot was tied at the 1 o'clock position.  Pressure on the cervix yielded excellent hemostasis.  She tolerated the procedure well.  She was taken to recovery room in stable condition.  All instrument, sponge, and needle counts were correct.     Allie Bossier, MD    MCD/MEDQ  D:  10/06/2010  T:  10/07/2010  Job:  [id number]  Electronically Signed by Nicholaus Bloom MD on 10/19/2010 01:37:04 PM

## 2010-10-20 ENCOUNTER — Encounter: Payer: Self-pay | Admitting: Obstetrics & Gynecology

## 2010-10-20 ENCOUNTER — Encounter: Payer: Self-pay | Admitting: Family Medicine

## 2010-10-20 ENCOUNTER — Ambulatory Visit: Payer: 59

## 2010-10-20 DIAGNOSIS — Z0189 Encounter for other specified special examinations: Secondary | ICD-10-CM

## 2010-10-20 LAB — CONVERTED CEMR LAB
ALT: 23 units/L (ref 0–35)
AST: 16 units/L (ref 0–37)
Albumin: 3.3 g/dL — ABNORMAL LOW (ref 3.5–5.2)
CO2: 20 meq/L (ref 19–32)
Calcium: 9 mg/dL (ref 8.4–10.5)
Chloride: 105 meq/L (ref 96–112)
Collection Interval-CRCL: 24 hr
Creatinine Clearance: 146 mL/min — ABNORMAL HIGH (ref 75–115)
Creatinine, Ser: 0.57 mg/dL (ref 0.40–1.20)
HCT: 34.3 % — ABNORMAL LOW (ref 36.0–46.0)
Hemoglobin: 10.8 g/dL — ABNORMAL LOW (ref 12.0–15.0)
Platelets: 183 10*3/uL (ref 150–400)
Potassium: 4.2 meq/L (ref 3.5–5.3)
RDW: 16.2 % — ABNORMAL HIGH (ref 11.5–15.5)
Total Protein: 6.5 g/dL (ref 6.0–8.3)
WBC: 9.9 10*3/uL (ref 4.0–10.5)

## 2010-10-24 ENCOUNTER — Inpatient Hospital Stay (HOSPITAL_COMMUNITY): Payer: 59

## 2010-10-24 ENCOUNTER — Inpatient Hospital Stay (HOSPITAL_COMMUNITY)
Admission: AD | Admit: 2010-10-24 | Discharge: 2010-10-24 | Disposition: A | Discharge status: Home / Self Care | Payer: 59 | Source: Ambulatory Visit | Attending: Obstetrics & Gynecology | Admitting: Obstetrics & Gynecology

## 2010-10-24 DIAGNOSIS — O3430 Maternal care for cervical incompetence, unspecified trimester: Secondary | ICD-10-CM | POA: Insufficient documentation

## 2010-10-24 DIAGNOSIS — O209 Hemorrhage in early pregnancy, unspecified: Secondary | ICD-10-CM | POA: Insufficient documentation

## 2010-10-24 LAB — CBC
Hemoglobin: 10.3 g/dL — ABNORMAL LOW (ref 12.0–15.0)
MCHC: 32.1 g/dL (ref 30.0–36.0)
Platelets: 168 10*3/uL (ref 150–400)
RBC: 4.03 MIL/uL (ref 3.87–5.11)

## 2010-10-24 LAB — ABO/RH: ABO/RH(D): B POS

## 2010-10-26 ENCOUNTER — Ambulatory Visit (HOSPITAL_COMMUNITY)
Admission: RE | Admit: 2010-10-26 | Discharge: 2010-10-26 | Disposition: A | Discharge status: Home / Self Care | Payer: 59 | Source: Ambulatory Visit | Attending: Obstetrics & Gynecology | Admitting: Obstetrics & Gynecology

## 2010-10-26 ENCOUNTER — Other Ambulatory Visit: Payer: Self-pay

## 2010-10-26 ENCOUNTER — Encounter (HOSPITAL_COMMUNITY): Payer: Self-pay

## 2010-10-26 DIAGNOSIS — O10019 Pre-existing essential hypertension complicating pregnancy, unspecified trimester: Secondary | ICD-10-CM | POA: Insufficient documentation

## 2010-10-26 DIAGNOSIS — O24919 Unspecified diabetes mellitus in pregnancy, unspecified trimester: Secondary | ICD-10-CM

## 2010-10-26 DIAGNOSIS — O99280 Endocrine, nutritional and metabolic diseases complicating pregnancy, unspecified trimester: Secondary | ICD-10-CM

## 2010-10-26 DIAGNOSIS — E079 Disorder of thyroid, unspecified: Secondary | ICD-10-CM

## 2010-10-26 DIAGNOSIS — O358XX0 Maternal care for other (suspected) fetal abnormality and damage, not applicable or unspecified: Secondary | ICD-10-CM | POA: Insufficient documentation

## 2010-10-26 DIAGNOSIS — O09529 Supervision of elderly multigravida, unspecified trimester: Secondary | ICD-10-CM | POA: Insufficient documentation

## 2010-10-26 DIAGNOSIS — O169 Unspecified maternal hypertension, unspecified trimester: Secondary | ICD-10-CM

## 2010-10-26 DIAGNOSIS — O09299 Supervision of pregnancy with other poor reproductive or obstetric history, unspecified trimester: Secondary | ICD-10-CM | POA: Insufficient documentation

## 2010-10-26 DIAGNOSIS — Z3689 Encounter for other specified antenatal screening: Secondary | ICD-10-CM

## 2010-10-26 DIAGNOSIS — Z363 Encounter for antenatal screening for malformations: Secondary | ICD-10-CM | POA: Insufficient documentation

## 2010-10-26 DIAGNOSIS — J45909 Unspecified asthma, uncomplicated: Secondary | ICD-10-CM

## 2010-10-26 DIAGNOSIS — Z1389 Encounter for screening for other disorder: Secondary | ICD-10-CM | POA: Insufficient documentation

## 2010-10-26 LAB — POCT URINALYSIS DIPSTICK
Bilirubin Urine: NEGATIVE
Ketones, ur: NEGATIVE mg/dL
Protein, ur: NEGATIVE mg/dL
Specific Gravity, Urine: 1.025 (ref 1.005–1.030)
pH: 6.5 (ref 5.0–8.0)

## 2010-10-30 ENCOUNTER — Other Ambulatory Visit: Payer: Self-pay | Admitting: Obstetrics & Gynecology

## 2010-10-30 ENCOUNTER — Other Ambulatory Visit: Payer: Self-pay

## 2010-10-30 DIAGNOSIS — O3430 Maternal care for cervical incompetence, unspecified trimester: Secondary | ICD-10-CM

## 2010-10-30 DIAGNOSIS — O09529 Supervision of elderly multigravida, unspecified trimester: Secondary | ICD-10-CM

## 2010-10-30 LAB — POCT URINALYSIS DIPSTICK
Hgb urine dipstick: NEGATIVE
Protein, ur: 30 mg/dL — AB
Specific Gravity, Urine: >=1.03 (ref 1.005–1.030)
Urobilinogen, UA: 0.2 mg/dL (ref 0.0–1.0)
pH: 6 (ref 5.0–8.0)

## 2010-10-31 ENCOUNTER — Encounter: Payer: Self-pay | Admitting: Obstetrics & Gynecology

## 2010-11-03 ENCOUNTER — Ambulatory Visit (HOSPITAL_COMMUNITY)
Admission: RE | Admit: 2010-11-03 | Discharge: 2010-11-03 | Disposition: A | Discharge status: Home / Self Care | Payer: 59 | Source: Ambulatory Visit | Attending: Obstetrics & Gynecology | Admitting: Obstetrics & Gynecology

## 2010-11-03 ENCOUNTER — Other Ambulatory Visit: Payer: Self-pay | Admitting: Obstetrics & Gynecology

## 2010-11-03 DIAGNOSIS — I6350 Cerebral infarction due to unspecified occlusion or stenosis of unspecified cerebral artery: Secondary | ICD-10-CM | POA: Insufficient documentation

## 2010-11-03 DIAGNOSIS — O3430 Maternal care for cervical incompetence, unspecified trimester: Secondary | ICD-10-CM

## 2010-11-03 DIAGNOSIS — O2620 Pregnancy care for patient with recurrent pregnancy loss, unspecified trimester: Secondary | ICD-10-CM | POA: Insufficient documentation

## 2010-11-03 DIAGNOSIS — O10019 Pre-existing essential hypertension complicating pregnancy, unspecified trimester: Secondary | ICD-10-CM | POA: Insufficient documentation

## 2010-11-03 DIAGNOSIS — I679 Cerebrovascular disease, unspecified: Secondary | ICD-10-CM | POA: Insufficient documentation

## 2010-11-06 ENCOUNTER — Other Ambulatory Visit: Payer: Self-pay

## 2010-11-06 ENCOUNTER — Ambulatory Visit: Payer: 59

## 2010-11-06 DIAGNOSIS — O169 Unspecified maternal hypertension, unspecified trimester: Secondary | ICD-10-CM

## 2010-11-06 DIAGNOSIS — O09219 Supervision of pregnancy with history of pre-term labor, unspecified trimester: Secondary | ICD-10-CM

## 2010-11-06 DIAGNOSIS — O99019 Anemia complicating pregnancy, unspecified trimester: Secondary | ICD-10-CM

## 2010-11-06 DIAGNOSIS — O99280 Endocrine, nutritional and metabolic diseases complicating pregnancy, unspecified trimester: Secondary | ICD-10-CM

## 2010-11-06 DIAGNOSIS — J45909 Unspecified asthma, uncomplicated: Secondary | ICD-10-CM

## 2010-11-06 DIAGNOSIS — O09529 Supervision of elderly multigravida, unspecified trimester: Secondary | ICD-10-CM

## 2010-11-06 DIAGNOSIS — E079 Disorder of thyroid, unspecified: Secondary | ICD-10-CM

## 2010-11-06 LAB — POCT URINALYSIS DIPSTICK
Glucose, UA: NEGATIVE mg/dL
Nitrite: NEGATIVE
Specific Gravity, Urine: 1.01 (ref 1.005–1.030)
Urobilinogen, UA: 0.2 mg/dL (ref 0.0–1.0)
pH: 6.5 (ref 5.0–8.0)

## 2010-11-07 ENCOUNTER — Inpatient Hospital Stay (HOSPITAL_COMMUNITY)
Admission: AD | Admit: 2010-11-07 | Discharge: 2010-11-08 | Disposition: A | Discharge status: Home / Self Care | Payer: 59 | Source: Ambulatory Visit | Attending: Obstetrics & Gynecology | Admitting: Obstetrics & Gynecology

## 2010-11-07 ENCOUNTER — Encounter (INDEPENDENT_AMBULATORY_CARE_PROVIDER_SITE_OTHER): Payer: Self-pay | Admitting: *Deleted

## 2010-11-07 DIAGNOSIS — O210 Mild hyperemesis gravidarum: Secondary | ICD-10-CM

## 2010-11-07 DIAGNOSIS — K5289 Other specified noninfective gastroenteritis and colitis: Secondary | ICD-10-CM | POA: Insufficient documentation

## 2010-11-07 DIAGNOSIS — O9989 Other specified diseases and conditions complicating pregnancy, childbirth and the puerperium: Secondary | ICD-10-CM

## 2010-11-07 DIAGNOSIS — O99891 Other specified diseases and conditions complicating pregnancy: Secondary | ICD-10-CM | POA: Insufficient documentation

## 2010-11-07 LAB — URINALYSIS, ROUTINE W REFLEX MICROSCOPIC
Bilirubin Urine: NEGATIVE
Glucose, UA: NEGATIVE mg/dL
Hgb urine dipstick: NEGATIVE
Protein, ur: NEGATIVE mg/dL

## 2010-11-08 NOTE — Progress Notes (Signed)
[name] was seen for nausea and vomiting which started after her first dose of 17p. She is status post cerclage and currently at 20 weeks of gestation.

## 2010-11-10 ENCOUNTER — Ambulatory Visit (HOSPITAL_COMMUNITY)
Admission: RE | Admit: 2010-11-10 | Discharge: 2010-11-10 | Disposition: A | Discharge status: Home / Self Care | Payer: 59 | Source: Ambulatory Visit | Attending: Obstetrics & Gynecology | Admitting: Obstetrics & Gynecology

## 2010-11-10 DIAGNOSIS — O10019 Pre-existing essential hypertension complicating pregnancy, unspecified trimester: Secondary | ICD-10-CM | POA: Insufficient documentation

## 2010-11-10 DIAGNOSIS — O3430 Maternal care for cervical incompetence, unspecified trimester: Secondary | ICD-10-CM

## 2010-11-10 DIAGNOSIS — O09299 Supervision of pregnancy with other poor reproductive or obstetric history, unspecified trimester: Secondary | ICD-10-CM | POA: Insufficient documentation

## 2010-11-10 DIAGNOSIS — O09529 Supervision of elderly multigravida, unspecified trimester: Secondary | ICD-10-CM | POA: Insufficient documentation

## 2010-11-13 ENCOUNTER — Other Ambulatory Visit: Payer: Self-pay

## 2010-11-13 ENCOUNTER — Encounter: Payer: 59 | Attending: Obstetrics & Gynecology | Admitting: Dietician

## 2010-11-13 DIAGNOSIS — O3430 Maternal care for cervical incompetence, unspecified trimester: Secondary | ICD-10-CM

## 2010-11-13 DIAGNOSIS — O169 Unspecified maternal hypertension, unspecified trimester: Secondary | ICD-10-CM

## 2010-11-13 DIAGNOSIS — O99280 Endocrine, nutritional and metabolic diseases complicating pregnancy, unspecified trimester: Secondary | ICD-10-CM

## 2010-11-13 DIAGNOSIS — O99810 Abnormal glucose complicating pregnancy: Secondary | ICD-10-CM | POA: Insufficient documentation

## 2010-11-13 DIAGNOSIS — E079 Disorder of thyroid, unspecified: Secondary | ICD-10-CM

## 2010-11-13 DIAGNOSIS — Z713 Dietary counseling and surveillance: Secondary | ICD-10-CM | POA: Insufficient documentation

## 2010-11-13 DIAGNOSIS — J45909 Unspecified asthma, uncomplicated: Secondary | ICD-10-CM

## 2010-11-13 LAB — POCT URINALYSIS DIPSTICK
Bilirubin Urine: NEGATIVE
Glucose, UA: NEGATIVE mg/dL
Hgb urine dipstick: NEGATIVE
Specific Gravity, Urine: 1.015 (ref 1.005–1.030)
Urobilinogen, UA: 0.2 mg/dL (ref 0.0–1.0)
pH: 6.5 (ref 5.0–8.0)

## 2010-11-14 ENCOUNTER — Ambulatory Visit: Payer: 59

## 2010-11-14 DIAGNOSIS — O169 Unspecified maternal hypertension, unspecified trimester: Secondary | ICD-10-CM

## 2010-11-16 LAB — POCT URINALYSIS DIPSTICK
Bilirubin Urine: NEGATIVE
Hgb urine dipstick: NEGATIVE
Ketones, ur: NEGATIVE mg/dL
Protein, ur: NEGATIVE mg/dL
pH: 5.5 (ref 5.0–8.0)

## 2010-11-20 ENCOUNTER — Encounter: Payer: Self-pay | Admitting: Obstetrics & Gynecology

## 2010-11-20 ENCOUNTER — Other Ambulatory Visit: Payer: Self-pay

## 2010-11-20 DIAGNOSIS — O99280 Endocrine, nutritional and metabolic diseases complicating pregnancy, unspecified trimester: Secondary | ICD-10-CM

## 2010-11-20 DIAGNOSIS — O3430 Maternal care for cervical incompetence, unspecified trimester: Secondary | ICD-10-CM

## 2010-11-20 DIAGNOSIS — O169 Unspecified maternal hypertension, unspecified trimester: Secondary | ICD-10-CM

## 2010-11-20 DIAGNOSIS — E079 Disorder of thyroid, unspecified: Secondary | ICD-10-CM

## 2010-11-20 DIAGNOSIS — O99810 Abnormal glucose complicating pregnancy: Secondary | ICD-10-CM

## 2010-11-20 DIAGNOSIS — J45909 Unspecified asthma, uncomplicated: Secondary | ICD-10-CM

## 2010-11-20 LAB — CONVERTED CEMR LAB
Free T4: 1.01 ng/dL (ref 0.80–1.80)
T3, Free: 2.8 pg/mL (ref 2.3–4.2)

## 2010-11-20 LAB — POCT URINALYSIS DIP (DEVICE)
Ketones, ur: NEGATIVE mg/dL
Protein, ur: NEGATIVE mg/dL
Specific Gravity, Urine: 1.01 (ref 1.005–1.030)
Urobilinogen, UA: 0.2 mg/dL (ref 0.0–1.0)
pH: 6.5 (ref 5.0–8.0)

## 2010-11-24 LAB — WET PREP, GENITAL: Yeast Wet Prep HPF POC: NONE SEEN

## 2010-11-24 LAB — POCT URINALYSIS DIP (DEVICE)
Hgb urine dipstick: NEGATIVE
Protein, ur: 30 mg/dL — AB
Specific Gravity, Urine: >=1.03 (ref 1.005–1.030)
Urobilinogen, UA: 1 mg/dL (ref 0.0–1.0)
pH: 6.5 (ref 5.0–8.0)

## 2010-11-24 LAB — GC/CHLAMYDIA PROBE AMP, GENITAL
Chlamydia, DNA Probe: NEGATIVE
GC Probe Amp, Genital: NEGATIVE

## 2010-11-26 LAB — CBC
HCT: 37.1 % (ref 36.0–46.0)
Platelets: 110 10*3/uL — ABNORMAL LOW (ref 150–400)
RDW: 15.7 % — ABNORMAL HIGH (ref 11.5–15.5)

## 2010-11-26 LAB — POCT PREGNANCY, URINE: Preg Test, Ur: NEGATIVE

## 2010-12-01 ENCOUNTER — Ambulatory Visit: Payer: 59

## 2010-12-01 DIAGNOSIS — O09219 Supervision of pregnancy with history of pre-term labor, unspecified trimester: Secondary | ICD-10-CM

## 2010-12-01 DIAGNOSIS — O169 Unspecified maternal hypertension, unspecified trimester: Secondary | ICD-10-CM

## 2010-12-01 DIAGNOSIS — Z331 Pregnant state, incidental: Secondary | ICD-10-CM

## 2010-12-07 ENCOUNTER — Other Ambulatory Visit: Payer: Self-pay | Admitting: Obstetrics and Gynecology

## 2010-12-07 ENCOUNTER — Other Ambulatory Visit: Payer: Self-pay | Admitting: Family Medicine

## 2010-12-07 DIAGNOSIS — I10 Essential (primary) hypertension: Secondary | ICD-10-CM

## 2010-12-07 DIAGNOSIS — O24919 Unspecified diabetes mellitus in pregnancy, unspecified trimester: Secondary | ICD-10-CM

## 2010-12-07 DIAGNOSIS — O09219 Supervision of pregnancy with history of pre-term labor, unspecified trimester: Secondary | ICD-10-CM

## 2010-12-07 DIAGNOSIS — O24419 Gestational diabetes mellitus in pregnancy, unspecified control: Secondary | ICD-10-CM

## 2010-12-07 LAB — POCT URINALYSIS DIP (DEVICE)
Protein, ur: NEGATIVE mg/dL
Specific Gravity, Urine: 1.02 (ref 1.005–1.030)
Urobilinogen, UA: 0.2 mg/dL (ref 0.0–1.0)

## 2010-12-09 LAB — DIFFERENTIAL
Eosinophils Relative: 2 % (ref 0–5)
Lymphocytes Relative: 27 % (ref 12–46)
Lymphs Abs: 2.4 10*3/uL (ref 0.7–4.0)
Monocytes Absolute: 0.7 10*3/uL (ref 0.1–1.0)
Monocytes Relative: 9 % (ref 3–12)

## 2010-12-09 LAB — POCT I-STAT, CHEM 8
BUN: 17 mg/dL (ref 6–23)
Calcium, Ion: 1.19 mmol/L (ref 1.12–1.32)
Creatinine, Ser: 0.6 mg/dL (ref 0.4–1.2)
Glucose, Bld: 118 mg/dL — ABNORMAL HIGH (ref 70–99)
TCO2: 24 mmol/L (ref 0–100)

## 2010-12-09 LAB — GC/CHLAMYDIA PROBE AMP, GENITAL: GC Probe Amp, Genital: NEGATIVE

## 2010-12-09 LAB — CBC
HCT: 38.6 % (ref 36.0–46.0)
Hemoglobin: 13.2 g/dL (ref 12.0–15.0)
Platelets: 145 10*3/uL — ABNORMAL LOW (ref 150–400)
RBC: 4.49 MIL/uL (ref 3.87–5.11)
RBC: 4.38 MIL/uL (ref 3.87–5.11)
RDW: 13.4 % (ref 11.5–15.5)
WBC: 8.8 10*3/uL (ref 4.0–10.5)
WBC: 6.6 10*3/uL (ref 4.0–10.5)

## 2010-12-09 LAB — URINALYSIS, ROUTINE W REFLEX MICROSCOPIC
Bilirubin Urine: NEGATIVE
Glucose, UA: NEGATIVE mg/dL
Ketones, ur: NEGATIVE mg/dL
Protein, ur: NEGATIVE mg/dL

## 2010-12-09 LAB — URINE MICROSCOPIC-ADD ON

## 2010-12-09 LAB — WET PREP, GENITAL: Clue Cells Wet Prep HPF POC: NONE SEEN

## 2010-12-09 LAB — HCG, SERUM, QUALITATIVE: Preg, Serum: NEGATIVE

## 2010-12-12 ENCOUNTER — Inpatient Hospital Stay (HOSPITAL_COMMUNITY)
Admission: AD | Admit: 2010-12-12 | Discharge: 2010-12-12 | Disposition: A | Discharge status: Home / Self Care | Payer: 59 | Source: Ambulatory Visit | Attending: Obstetrics and Gynecology | Admitting: Obstetrics and Gynecology

## 2010-12-12 DIAGNOSIS — O10019 Pre-existing essential hypertension complicating pregnancy, unspecified trimester: Secondary | ICD-10-CM | POA: Insufficient documentation

## 2010-12-12 DIAGNOSIS — N644 Mastodynia: Secondary | ICD-10-CM | POA: Insufficient documentation

## 2010-12-12 DIAGNOSIS — O99891 Other specified diseases and conditions complicating pregnancy: Secondary | ICD-10-CM | POA: Insufficient documentation

## 2010-12-12 LAB — URINALYSIS, ROUTINE W REFLEX MICROSCOPIC
Ketones, ur: NEGATIVE mg/dL
Nitrite: NEGATIVE
Specific Gravity, Urine: 1.02 (ref 1.005–1.030)
Urobilinogen, UA: 0.2 mg/dL (ref 0.0–1.0)
pH: 6.5 (ref 5.0–8.0)

## 2010-12-12 LAB — CBC
Hemoglobin: 10.1 g/dL — ABNORMAL LOW (ref 12.0–15.0)
MCH: 25.1 pg — ABNORMAL LOW (ref 26.0–34.0)
MCHC: 31.6 g/dL (ref 30.0–36.0)
Platelets: 142 10*3/uL — ABNORMAL LOW (ref 150–400)

## 2010-12-12 LAB — COMPREHENSIVE METABOLIC PANEL
ALT: 17 U/L (ref 0–35)
AST: 15 U/L (ref 0–37)
Albumin: 2.8 g/dL — ABNORMAL LOW (ref 3.5–5.2)
CO2: 22 mEq/L (ref 19–32)
Calcium: 9.4 mg/dL (ref 8.4–10.5)
Creatinine, Ser: 0.67 mg/dL (ref 0.4–1.2)
GFR calc Af Amer: >60 mL/min (ref >60)
GFR calc non Af Amer: >60 mL/min (ref >60)
Sodium: 135 mEq/L (ref 135–145)
Total Protein: 6.4 g/dL (ref 6.0–8.3)

## 2010-12-12 LAB — PROTEIN / CREATININE RATIO, URINE: Protein Creatinine Ratio: 0.11 (ref 0.00–0.15)

## 2010-12-14 ENCOUNTER — Other Ambulatory Visit: Payer: Self-pay | Admitting: Family Medicine

## 2010-12-14 ENCOUNTER — Inpatient Hospital Stay (HOSPITAL_COMMUNITY)
Admission: AD | Admit: 2010-12-14 | Discharge: 2010-12-26 | DRG: 765 | Disposition: A | Discharge status: Home / Self Care | Payer: 59 | Source: Ambulatory Visit | Attending: Obstetrics & Gynecology | Admitting: Obstetrics & Gynecology

## 2010-12-14 ENCOUNTER — Ambulatory Visit (HOSPITAL_COMMUNITY)
Admission: RE | Admit: 2010-12-14 | Discharge: 2010-12-14 | Disposition: A | Discharge status: Home / Self Care | Payer: 59 | Source: Ambulatory Visit | Attending: Family Medicine | Admitting: Family Medicine

## 2010-12-14 ENCOUNTER — Ambulatory Visit: Payer: 59

## 2010-12-14 DIAGNOSIS — O09529 Supervision of elderly multigravida, unspecified trimester: Secondary | ICD-10-CM | POA: Insufficient documentation

## 2010-12-14 DIAGNOSIS — O99814 Abnormal glucose complicating childbirth: Secondary | ICD-10-CM | POA: Diagnosis present

## 2010-12-14 DIAGNOSIS — I10 Essential (primary) hypertension: Secondary | ICD-10-CM

## 2010-12-14 DIAGNOSIS — O10919 Unspecified pre-existing hypertension complicating pregnancy, unspecified trimester: Secondary | ICD-10-CM

## 2010-12-14 DIAGNOSIS — O169 Unspecified maternal hypertension, unspecified trimester: Secondary | ICD-10-CM | POA: Insufficient documentation

## 2010-12-14 DIAGNOSIS — E039 Hypothyroidism, unspecified: Secondary | ICD-10-CM | POA: Insufficient documentation

## 2010-12-14 DIAGNOSIS — O09219 Supervision of pregnancy with history of pre-term labor, unspecified trimester: Secondary | ICD-10-CM

## 2010-12-14 DIAGNOSIS — O24419 Gestational diabetes mellitus in pregnancy, unspecified control: Secondary | ICD-10-CM

## 2010-12-14 DIAGNOSIS — O3430 Maternal care for cervical incompetence, unspecified trimester: Secondary | ICD-10-CM

## 2010-12-14 DIAGNOSIS — IMO0002 Reserved for concepts with insufficient information to code with codable children: Secondary | ICD-10-CM

## 2010-12-14 DIAGNOSIS — O1002 Pre-existing essential hypertension complicating childbirth: Secondary | ICD-10-CM | POA: Diagnosis present

## 2010-12-14 DIAGNOSIS — O09299 Supervision of pregnancy with other poor reproductive or obstetric history, unspecified trimester: Secondary | ICD-10-CM | POA: Insufficient documentation

## 2010-12-14 DIAGNOSIS — Z8673 Personal history of transient ischemic attack (TIA), and cerebral infarction without residual deficits: Secondary | ICD-10-CM

## 2010-12-14 DIAGNOSIS — Z302 Encounter for sterilization: Secondary | ICD-10-CM

## 2010-12-14 DIAGNOSIS — E079 Disorder of thyroid, unspecified: Secondary | ICD-10-CM | POA: Diagnosis present

## 2010-12-14 DIAGNOSIS — O99284 Endocrine, nutritional and metabolic diseases complicating childbirth: Secondary | ICD-10-CM | POA: Diagnosis present

## 2010-12-14 DIAGNOSIS — O4590 Premature separation of placenta, unspecified, unspecified trimester: Principal | ICD-10-CM | POA: Diagnosis present

## 2010-12-14 DIAGNOSIS — O99810 Abnormal glucose complicating pregnancy: Secondary | ICD-10-CM | POA: Insufficient documentation

## 2010-12-14 LAB — POCT URINALYSIS DIP (DEVICE)
Bilirubin Urine: NEGATIVE
Glucose, UA: NEGATIVE mg/dL
Hgb urine dipstick: NEGATIVE
Hgb urine dipstick: NEGATIVE
Ketones, ur: NEGATIVE mg/dL
Nitrite: NEGATIVE
Protein, ur: NEGATIVE mg/dL
Specific Gravity, Urine: <=1.005 (ref 1.005–1.030)
Specific Gravity, Urine: >=1.03 (ref 1.005–1.030)
pH: 5.5 (ref 5.0–8.0)
pH: 6.5 (ref 5.0–8.0)

## 2010-12-14 LAB — URINE MICROSCOPIC-ADD ON

## 2010-12-14 LAB — GLUCOSE, CAPILLARY
Glucose-Capillary: 100 mg/dL — ABNORMAL HIGH (ref 70–99)
Glucose-Capillary: 108 mg/dL — ABNORMAL HIGH (ref 70–99)
Glucose-Capillary: 144 mg/dL — ABNORMAL HIGH (ref 70–99)
Glucose-Capillary: 71 mg/dL (ref 70–99)
Glucose-Capillary: 98 mg/dL (ref 70–99)
Glucose-Capillary: 91 mg/dL (ref 70–99)

## 2010-12-14 LAB — CBC
HCT: 30 % — ABNORMAL LOW (ref 36.0–46.0)
HCT: 40 % (ref 36.0–46.0)
Hemoglobin: 12.6 g/dL (ref 12.0–15.0)
Hemoglobin: 13.5 g/dL (ref 12.0–15.0)
Hemoglobin: 9.7 g/dL — ABNORMAL LOW (ref 12.0–15.0)
MCHC: 33.6 g/dL (ref 30.0–36.0)
MCHC: 33.7 g/dL (ref 30.0–36.0)
MCHC: 33.9 g/dL (ref 30.0–36.0)
MCHC: 33.8 g/dL (ref 30.0–36.0)
MCV: 88.4 fL (ref 78.0–100.0)
MCV: 88.6 fL (ref 78.0–100.0)
MCV: 79.4 fL (ref 78.0–100.0)
Platelets: 135 10*3/uL — ABNORMAL LOW (ref 150–400)
Platelets: 140 10*3/uL — ABNORMAL LOW (ref 150–400)
RBC: 4.19 MIL/uL (ref 3.87–5.11)
RBC: 4.54 MIL/uL (ref 3.87–5.11)
RBC: 4.59 MIL/uL (ref 3.87–5.11)
RBC: 4.52 MIL/uL (ref 3.87–5.11)
RDW: 13.5 % (ref 11.5–15.5)
RDW: 13.3 % (ref 11.5–15.5)
WBC: 9.5 10*3/uL (ref 4.0–10.5)
WBC: 8.8 10*3/uL (ref 4.0–10.5)

## 2010-12-14 LAB — STREP B DNA PROBE: Strep Group B Ag: NEGATIVE

## 2010-12-14 LAB — COMPREHENSIVE METABOLIC PANEL
ALT: 15 U/L (ref 0–35)
Alkaline Phosphatase: 83 U/L (ref 39–117)
BUN: 14 mg/dL (ref 6–23)
CO2: 21 mEq/L (ref 19–32)
GFR calc non Af Amer: >60 mL/min (ref >60)
Glucose, Bld: 108 mg/dL — ABNORMAL HIGH (ref 70–99)
Potassium: 4 mEq/L (ref 3.5–5.1)
Sodium: 133 mEq/L — ABNORMAL LOW (ref 135–145)
Total Bilirubin: 0.4 mg/dL (ref 0.3–1.2)

## 2010-12-14 LAB — DIFFERENTIAL
Basophils Absolute: 0.1 10*3/uL (ref 0.0–0.1)
Basophils Relative: 1 % (ref 0–1)
Basophils Relative: 1 % (ref 0–1)
Eosinophils Absolute: 0.5 10*3/uL (ref 0.0–0.7)
Eosinophils Relative: 4 % (ref 0–5)
Monocytes Absolute: 0.7 10*3/uL (ref 0.1–1.0)
Monocytes Relative: 6 % (ref 3–12)
Monocytes Relative: 6 % (ref 3–12)
Neutro Abs: 9.6 10*3/uL — ABNORMAL HIGH (ref 1.7–7.7)
Neutro Abs: 8.7 10*3/uL — ABNORMAL HIGH (ref 1.7–7.7)
Neutrophils Relative %: 78 % — ABNORMAL HIGH (ref 43–77)

## 2010-12-14 LAB — URINALYSIS, ROUTINE W REFLEX MICROSCOPIC
Bilirubin Urine: NEGATIVE
Nitrite: NEGATIVE
Specific Gravity, Urine: 1.025 (ref 1.005–1.030)
Urobilinogen, UA: 0.2 mg/dL (ref 0.0–1.0)
pH: 6 (ref 5.0–8.0)

## 2010-12-14 LAB — GC/CHLAMYDIA PROBE AMP, GENITAL
Chlamydia, DNA Probe: NEGATIVE
GC Probe Amp, Genital: NEGATIVE

## 2010-12-15 LAB — GLUCOSE, CAPILLARY
Glucose-Capillary: 98 mg/dL (ref 70–99)
Glucose-Capillary: 121 mg/dL — ABNORMAL HIGH (ref 70–99)

## 2010-12-16 ENCOUNTER — Inpatient Hospital Stay (HOSPITAL_COMMUNITY): Payer: 59

## 2010-12-16 LAB — COMPREHENSIVE METABOLIC PANEL
ALT: 16 U/L (ref 0–35)
AST: 20 U/L (ref 0–37)
Albumin: 2.9 g/dL — ABNORMAL LOW (ref 3.5–5.2)
Alkaline Phosphatase: 79 U/L (ref 39–117)
Calcium: 9.3 mg/dL (ref 8.4–10.5)
GFR calc Af Amer: >60 mL/min (ref >60)
Glucose, Bld: 133 mg/dL — ABNORMAL HIGH (ref 70–99)
Potassium: 4.1 mEq/L (ref 3.5–5.1)
Sodium: 134 mEq/L — ABNORMAL LOW (ref 135–145)
Total Protein: 6.3 g/dL (ref 6.0–8.3)

## 2010-12-16 LAB — STREP B DNA PROBE

## 2010-12-16 LAB — GLUCOSE, CAPILLARY
Glucose-Capillary: 94 mg/dL (ref 70–99)
Glucose-Capillary: 84 mg/dL (ref 70–99)

## 2010-12-16 LAB — TYPE AND SCREEN
ABO/RH(D): B POS
Antibody Screen: NEGATIVE

## 2010-12-16 LAB — CBC
HCT: 30.7 % — ABNORMAL LOW (ref 36.0–46.0)
Hemoglobin: 9.7 g/dL — ABNORMAL LOW (ref 12.0–15.0)
MCHC: 31.6 g/dL (ref 30.0–36.0)
RBC: 3.9 MIL/uL (ref 3.87–5.11)

## 2010-12-17 LAB — GLUCOSE, CAPILLARY
Glucose-Capillary: 109 mg/dL — ABNORMAL HIGH (ref 70–99)
Glucose-Capillary: 74 mg/dL (ref 70–99)

## 2010-12-18 LAB — POCT URINALYSIS DIP (DEVICE)
Bilirubin Urine: NEGATIVE
Bilirubin Urine: NEGATIVE
Bilirubin Urine: NEGATIVE
Glucose, UA: NEGATIVE mg/dL
Glucose, UA: NEGATIVE mg/dL
Hgb urine dipstick: NEGATIVE
Ketones, ur: NEGATIVE mg/dL
Ketones, ur: NEGATIVE mg/dL
Ketones, ur: NEGATIVE mg/dL
Nitrite: NEGATIVE
Nitrite: NEGATIVE
Specific Gravity, Urine: <=1.005 (ref 1.005–1.030)

## 2010-12-18 LAB — GLUCOSE, CAPILLARY: Glucose-Capillary: 108 mg/dL — ABNORMAL HIGH (ref 70–99)

## 2010-12-19 ENCOUNTER — Other Ambulatory Visit (HOSPITAL_COMMUNITY): Payer: 59

## 2010-12-19 ENCOUNTER — Inpatient Hospital Stay (HOSPITAL_COMMUNITY): Payer: 59

## 2010-12-19 LAB — POCT URINALYSIS DIP (DEVICE)
Bilirubin Urine: NEGATIVE
Glucose, UA: NEGATIVE mg/dL
Ketones, ur: NEGATIVE mg/dL
Ketones, ur: NEGATIVE mg/dL
Nitrite: NEGATIVE
Protein, ur: 30 mg/dL — AB
Protein, ur: NEGATIVE mg/dL
Specific Gravity, Urine: >=1.03 (ref 1.005–1.030)
Urobilinogen, UA: 0.2 mg/dL (ref 0.0–1.0)

## 2010-12-19 LAB — GLUCOSE, CAPILLARY
Glucose-Capillary: 98 mg/dL (ref 70–99)
Glucose-Capillary: 71 mg/dL (ref 70–99)
Glucose-Capillary: 104 mg/dL — ABNORMAL HIGH (ref 70–99)

## 2010-12-20 LAB — GLUCOSE, CAPILLARY
Glucose-Capillary: 83 mg/dL (ref 70–99)
Glucose-Capillary: 105 mg/dL — ABNORMAL HIGH (ref 70–99)

## 2010-12-21 LAB — GLUCOSE, CAPILLARY
Glucose-Capillary: 76 mg/dL (ref 70–99)
Glucose-Capillary: 137 mg/dL — ABNORMAL HIGH (ref 70–99)
Glucose-Capillary: 98 mg/dL (ref 70–99)

## 2010-12-22 ENCOUNTER — Inpatient Hospital Stay (HOSPITAL_COMMUNITY): Payer: 59

## 2010-12-22 ENCOUNTER — Other Ambulatory Visit (HOSPITAL_COMMUNITY): Payer: 59

## 2010-12-22 ENCOUNTER — Ambulatory Visit (HOSPITAL_COMMUNITY): Payer: 59

## 2010-12-22 LAB — GLUCOSE, CAPILLARY: Glucose-Capillary: 114 mg/dL — ABNORMAL HIGH (ref 70–99)

## 2010-12-22 LAB — COMPREHENSIVE METABOLIC PANEL
Albumin: 2.9 g/dL — ABNORMAL LOW (ref 3.5–5.2)
BUN: 17 mg/dL (ref 6–23)
Chloride: 105 mEq/L (ref 96–112)
Creatinine, Ser: 0.63 mg/dL (ref 0.4–1.2)
GFR calc non Af Amer: >60 mL/min (ref >60)
Glucose, Bld: 70 mg/dL (ref 70–99)
Total Bilirubin: 0.3 mg/dL (ref 0.3–1.2)

## 2010-12-22 LAB — CBC
MCH: 25 pg — ABNORMAL LOW (ref 26.0–34.0)
MCV: 79.5 fL (ref 78.0–100.0)
Platelets: 165 10*3/uL (ref 150–400)
RBC: 4.2 MIL/uL (ref 3.87–5.11)

## 2010-12-22 LAB — PROTEIN / CREATININE RATIO, URINE: Protein Creatinine Ratio: 0.13 (ref 0.00–0.15)

## 2010-12-23 ENCOUNTER — Inpatient Hospital Stay (HOSPITAL_COMMUNITY): Payer: 59

## 2010-12-23 LAB — CBC
Hemoglobin: 10.4 g/dL — ABNORMAL LOW (ref 12.0–15.0)
MCH: 25.6 pg — ABNORMAL LOW (ref 26.0–34.0)
MCHC: 32.5 g/dL (ref 30.0–36.0)
Platelets: 149 10*3/uL — ABNORMAL LOW (ref 150–400)
RBC: 4.06 MIL/uL (ref 3.87–5.11)

## 2010-12-23 LAB — COMPREHENSIVE METABOLIC PANEL
CO2: 18 mEq/L — ABNORMAL LOW (ref 19–32)
Calcium: 7.8 mg/dL — ABNORMAL LOW (ref 8.4–10.5)
Creatinine, Ser: 0.64 mg/dL (ref 0.4–1.2)
GFR calc Af Amer: >60 mL/min (ref >60)
GFR calc non Af Amer: >60 mL/min (ref >60)
Glucose, Bld: 95 mg/dL (ref 70–99)
Sodium: 131 mEq/L — ABNORMAL LOW (ref 135–145)
Total Protein: 6.7 g/dL (ref 6.0–8.3)

## 2010-12-23 LAB — CREATININE CLEARANCE, URINE, 24 HOUR
Creatinine Clearance: 157 mL/min — ABNORMAL HIGH (ref 75–115)
Creatinine, 24H Ur: 1357 mg/d (ref 700–1800)
Creatinine: 0.6 mg/dL (ref 0.4–1.2)
Urine Total Volume-CRCL: 1800 mL

## 2010-12-23 LAB — GLUCOSE, CAPILLARY
Glucose-Capillary: 90 mg/dL (ref 70–99)
Glucose-Capillary: 108 mg/dL — ABNORMAL HIGH (ref 70–99)

## 2010-12-23 LAB — PROTIME-INR
INR: 0.91 (ref 0.00–1.49)
Prothrombin Time: 12.5 seconds (ref 11.6–15.2)

## 2010-12-24 ENCOUNTER — Other Ambulatory Visit: Payer: Self-pay | Admitting: Obstetrics & Gynecology

## 2010-12-24 DIAGNOSIS — O3430 Maternal care for cervical incompetence, unspecified trimester: Secondary | ICD-10-CM

## 2010-12-24 DIAGNOSIS — O1002 Pre-existing essential hypertension complicating childbirth: Secondary | ICD-10-CM

## 2010-12-24 DIAGNOSIS — O4590 Premature separation of placenta, unspecified, unspecified trimester: Secondary | ICD-10-CM

## 2010-12-24 LAB — GLUCOSE, CAPILLARY
Glucose-Capillary: 98 mg/dL (ref 70–99)
Glucose-Capillary: 140 mg/dL — ABNORMAL HIGH (ref 70–99)

## 2010-12-25 LAB — CBC
HCT: 25.2 % — ABNORMAL LOW (ref 36.0–46.0)
Hemoglobin: 8 g/dL — ABNORMAL LOW (ref 12.0–15.0)
MCH: 25.6 pg — ABNORMAL LOW (ref 26.0–34.0)
MCHC: 31.7 g/dL (ref 30.0–36.0)
MCV: 80.5 fL (ref 78.0–100.0)
RDW: 15.6 % — ABNORMAL HIGH (ref 11.5–15.5)

## 2010-12-25 LAB — GLUCOSE, CAPILLARY: Glucose-Capillary: 90 mg/dL (ref 70–99)

## 2010-12-25 LAB — PROTEIN, URINE, 24 HOUR
Collection Interval-UPROT: 24 hours
Urine Total Volume-UPROT: 1800 mL

## 2010-12-25 NOTE — Op Note (Signed)
NAME:  [name], [name]       ACCOUNT NO.:  [id number]  MEDICAL RECORD NO.:  [id number]           PATIENT TYPE:  I  LOCATION:  9151                          FACILITY:  [facility]  PHYSICIAN:  Allie Bossier, MD        DATE OF BIRTH:  May 15, 1971  DATE OF PROCEDURE:  12/24/2010 DATE OF DISCHARGE:                              OPERATIVE REPORT   PREOPERATIVE DIAGNOSES:  27 weeks estimated gestational age, abruption, absent end diastolic flow, chronic hypertension, gestational diabetes, history of stroke and an incompetent cervix, desires sterility and advanced maternal age.  POSTOPERATIVE DIAGNOSES:  27 weeks estimated gestational age, abruption, absent end diastolic flow, chronic hypertension, gestational diabetes, history of stroke and an incompetent cervix, desires sterility and advanced maternal age.  PROCEDURES:  Classical cesarean section, removal of cerclage and Filshie clip application/postpartum sterilization.  SURGEON:  Lenka Zhao C. Marice Potter, MD  ANESTHESIA:  Brayton Caves, MD, spinal.  COMPLICATIONS:  None.  ESTIMATED BLOOD LOSS:  700 mL.  SPECIMENS:  Cord blood and placenta.  FINDINGS:  Large placental abruption.  Female infant weighing 800 g, Apgars 7 and 8 at 1 and 5 minutes, normal pelvic anatomy.  DETAILED PROCEDURES AND FINDINGS:  The risks, benefits, alternatives of surgery were explained, understood and accepted.  Consents were signed. The patient said she would like her tubes tied, however her husband initially said no.  During the case, however, after the baby was delivered, I explained to him the high risk of any future pregnancies due to her extremely risky medical problems, the husband then agreed. Please note the patient had one of her tubes tied at the whole time.  So her anesthesia was applied without complication.  Her abdomen and vagina were prepped and draped in usual sterile fashion.  Foley catheter was placed which drained clear urine throughout.  After  adequate anesthesia was assured, a transverse incision was made approximately 2 cm above the symphysis pubis.  Incision was carried down through the subcutaneous tissue and the fascia.  Fascia was scored in the midline.  Fascial incision was extended bilaterally and curved slightly upwards.  The rectus muscles were separated in the midline (approximately 50%) with the Bovie.  Excellent hemostasis was maintained.  The peritoneum was entered with hemostats.  Peritoneal incision was extended bilaterally and curved slightly upwards.  A bladder blade was placed.  There was absolute no development of lower uterine segment.  Therefore, I made a vertical/classical incision on the uterus.  Incision was carried down to the very thick uterus till I came to the placenta, the placenta delivered itself to the abruption that was noted.  Amniotomy was performed with hemostats.  Clear fluid was noted.  The baby was delivered from a vertex presentation.  Cord was clamped and cut and was transferred to the NICU pediatrician right away.  Its weight and Apgars are listed above.  I attempted to get a cord blood gas that the cord was so tenuous and had a paucity of blood in it, but I was unable able to obtain either the cord gas or the cord blood sample.  The placenta had already essentially delivered itself.  The uterus  was exteriorized and the uterine interior was cleaned with a dry lap sponge.  A bladder blade was placed.  The uterine incision was closed with 2 layers of 2-0 Vicryl in a running locking fashion.  Excellent hemostasis was noted.  The ovaries appeared normal.  Again as stated above, I conferred with the patient's husband and he then said he would like for her to have her tubes tied.  I placed Filshie clips in the isthmic region of each oviduct.  No bleeding was noted from the incision or the oviducts and I re-placed the uterus duct in the pelvic cavity.  The uterine incision was again  inspected, noted to be hemostatic as were the rectus fascia, rectus muscle.  Rectus fascia was closed with 0 Vicryl in a running nonlocking fashion.  No defects were palpable.  The subcutaneous tissue was irrigated, cleaned and dried.  Please note that prior to the incision I injected the 30 mL of 0.5% Marcaine in the incision site.  At this time I did a subcuticular closure with 3-0 Vicryl suture. Excellent hemostasis was noted.  She was then placed in the lithotomy position.  A speculum was placed and I was able to visualize the intact cerclage.  I cut and removed the stitch, no bleeding was noted from the cervix other than appropriate lochia.  Instrument, sponge, and needle counts were correct.  Her Foley catheter drained clear urine throughout. She was taken to recovery room in stable condition.     Allie Bossier, MD     MCD/MEDQ  D:  12/24/2010  T:  12/24/2010  Job:  [id number]  Electronically Signed by Nicholaus Bloom MD on 12/25/2010 10:51:47 AM

## 2010-12-26 ENCOUNTER — Other Ambulatory Visit (HOSPITAL_COMMUNITY): Payer: 59

## 2010-12-26 ENCOUNTER — Ambulatory Visit (HOSPITAL_COMMUNITY): Payer: 59

## 2010-12-29 ENCOUNTER — Other Ambulatory Visit (HOSPITAL_COMMUNITY): Payer: 59

## 2011-01-08 LAB — FUNGUS CULTURE W SMEAR: Fungal Smear: NONE SEEN

## 2011-01-15 NOTE — Discharge Summary (Signed)
[name], [name]       ACCOUNT NO.:  [id number]  MEDICAL RECORD NO.:  [id number]           PATIENT TYPE:  I  LOCATION:  9309                          FACILITY:  [facility]  PHYSICIAN:  Lesly Dukes, M.D. DATE OF BIRTH:  [birth year]  DATE OF ADMISSION:  12/14/2010 DATE OF DISCHARGE:  12/26/2010                              DISCHARGE SUMMARY   DISCHARGE DIAGNOSES: 1. Placental abruption. 2. Preterm delivery.  PROCEDURES PERFORMED:  Classical cesarean section, removal of cerclage bilateral tubal ligation with clips.  PERFORMING PHYSICIAN:  Allie Bossier, MD  CONSULTS:  Maternal Fetal Medicine.  PERTINENT LABORATORY DATA:  The patient was group B strep negative. Original CBC with white blood cell count of 8.8, H and H of 9.7 and 30, and a platelet count of 143.  Protein to creatinine ratio of 0.13.  A 24- hour urine creatinine was 1357.  Total 24-hour protein was 90.  CBC day before discharge, white count of 12.1, H/H of 8/25.2, platelet count of 122.  PERTINENT IMAGING:  The patient had multiple ultrasounds of the fetal cord with new end-diastolic flow and change in the patient's growth.  HOSPITAL COURSE:  This is a 40 year old Hispanic female admitted on December 14, 2010, G4, P 0-1-2-0, presenting at 72 and 6 weeks of gestation based on a last menstrual period with a chief concerns of periodic AEDF and chronic hypertension as well as end-diastolic flow issues on ultrasound.  The patient was admitted with no contractions but per the AEDF growth lag, the patient was admitted.  Mother also suffered from gestational diabetes mellitus, chronic hypertension, history of CVA in 2008, for which she has been on heparin this pregnancy.  She is normally not on an anticoagulant.  She was admitted and watched.  Her blood sugars and blood pressures were controlled.  The fetal status was reassuring through most of the stay.  A second ultrasound and BPP was performed by Maternal Fetal  Medicine.  Betamethasone was administered to mature the baby's lungs in case of delivery.  Mother started to have worsening of blood pressures on December 22, 2010.  The baby was still having intermittent AEDF.  Maternal Fetal Medicine was following closely with the case.  The Radiology Department notified Dr. Marice Potter on December 23, 2010, this is a new abruption in the placenta, also AEDF and BPP of 6 out of 10 which indicated that delivery was necessary.  On December 24, 2010, after receiving prophylactic magnesium IV, the patient had a viable female with Apgars of 7 at 1 and 8 at 5 and 800 grams via classic C-section with removal of cerclage and bilateral tubal ligation.  There was a large abruption on findings.  Estimated blood loss was minimal.  The patient did fine, on postoperative day 1 was transferred to regular mother-baby floor.  She had no complaints.  She had pulsation of vaginal bleeding.  She was able to pass urine, stool, and her pain was well controlled.  On postoperative day #2, the patient was again seen with no new complaints and no bleeding, using the bathroom at home and both she and her husband desired to be discharged home.  Her  C-section incision looked fine, it is clean, dry, and intact.  Followup will be in [facility] weeks at the [facility].  The patient will be seen by the baby love team to check her blood pressure in 3 days.  DISCHARGE MEDICATIONS: 1. Omeprazole 20 mg one p.o. daily. 2. Amlodipine 5 mg one p.o. daily. 3. Lovenox 40 mg subcu q.12 h. for 6 weeks. 4. Hydrocortisone cream. 5. Prenatal vitamins. 6. Levothroid 200 mcg one by mouth daily. 7. Labetalol 300 mg two tablets by mouth twice daily. 8. Iron 65 mg. 9. Folic acid. 10.Delalutin 250 mg injection intramuscular weekly. 11.Aspirin 81 mg by mouth daily.  Her discharge condition is improved.  Her baby is in the NICU.  The patient was explained all possible risks, benefits, and alternatives to everything  that occurred and was in agreement.    ______________________________ Edd Arbour, MD   ______________________________ Lesly Dukes, M.D.    JO/MEDQ  D:  12/26/2010  T:  12/26/2010  Job:  [id number]  Electronically Signed by Edd Arbour MD on 01/01/2011 05:08:15 PM Electronically Signed by Elsie Lincoln M.D. on 01/15/2011 10:29:32 AM

## 2011-01-16 NOTE — Discharge Summary (Signed)
NAMEDEVEN, AUDI               ACCOUNT NO.:  [id number]   MEDICAL RECORD NO.:  [id number]          PATIENT TYPE:  INP   LOCATION:  2038                         FACILITY:  [facility]   PHYSICIAN:  Wayne A. Sheffield Slider, M.D.    DATE OF BIRTH:  01-24-1971   DATE OF ADMISSION:  03/15/2007  DATE OF DISCHARGE:  03/16/2007                               DISCHARGE SUMMARY   PRIMARY CARE PHYSICIAN:  Dr. Zachery Dauer at [facility].   DISCHARGE DIAGNOSES:  1. 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  2. History of left basal ganglia cerebrovascular accident.  3. History of depression.  4. Polycystic ovarian syndrome.  5. Obesity.  6. Hypothyroidism.  7. Hypertension.  8. Hyperlipidemia.  9. Asthma.   DISCHARGE MEDICATIONS:  1. Albuterol MDI 2 inhalations q.4 h., as needed.  2. Metoprolol 100 mg 1/2 tablet 3 times a day. (This is increased from      previous 2 times a daily with intention of titrating up to 100 mg      p.o. 2 times a day total).  3. The patient is to stop taking Aldomet until further follow up with      Dr. Sheffield Slider tomorrow in his [facility] as he desires to wean her      off of medication.  4. Enalapril 10 mg p.o. daily.  5. Hydrochlorothiazide 12.5 mg p.o. daily.  6. Simvastatin 80 mg p.o. daily.  7. Synthroid 200 mcg p.o. daily.  8. Aspirin 325 mg p.o. daily.   PROCEDURE:  Head CT without contrast showed remote left basal ganglia  lacunar infarct.  Vague, subtle hypodensity in the posterior limb of the  right internal capsule, which potentially may represent an acute small  vessel infarction and was  not detected at the same site on CT in April  of 2008.  Head CT was otherwise negative.   CONSULTATIONS:  None.   PERTINENT LABORATORY AND X-RAY DATA:  Homocystine level was normal at  6.3.  TSH was elevated at 72.755.  Lipid profile, fasting lipid panel,  revealed a total cholesterol of 252, triglycerides of 326.  HDL of 30,  LDL of 157.   Comprehensive metabolic panel showed was normal with the  exception of low albumin of 3.3 and a random glucose of 105.  The  creatinine was 0.80.  CBC with differential obtained on March 15, 2007,  showed a white blood cell count of 10,000, hemoglobin of 13.3,  hematocrit of 39.3, and platelets of 161,000 with no left shift.   BRIEF HOSPITAL COURSE:  This is a 40 year old Hispanic female who was  admitted for overnight observation after presenting with dizziness,  headache, nausea, blurred vision, right arm pain.  All neurologic  complaints similar to her previous cerebrovascular accident in April of  2008.  The symptoms began on the morning when she woke up and resolved  upon taking a nap.  The symptoms then recurred several hours later which  prompted her to presents to the emergency department.  The headache was  described as unilateral, throbbing and felt better after  taking morphine  in the ED.  The patient did have mild photophobia and nausea but no  vomiting.  She has a history of migraines.   PROBLEMS:  1. Neurological symptoms:  Felt likely secondary to complex migraine      as the symptoms completely resolved and head CT was negative with      the exception of a possible small vessel posterior capsule infarct      which was on the right side and consistent with the patient's      symptoms.  The patient was observed overnight with frequent neural      checks and her neurological examination remains normal and      nonfocal.  The patient's symptoms have completely resolved prior to      discharge.  The patient was discharged home in stable medical      condition.  The patient's homocysteine level was checked prior to      discharge and found to be normal.  Her fasting lipid panel was      elevated; however, the patient has had problems with adherence in      the past.  The patient will continue on her full dose aspirin,      given lack of concern for any additional stroke while  on aspirin      therapy.  2. Hypertension:  The patient's blood pressure remains slightly above      goal and her metoprolol was increased to 50 mg p.o. 3 times a day      prior to discharge with the intention of titrating up to 100 mg      p.o. 2 times a day and discontinuing Aldomet.  Would recommend      further follow up of blood pressure in an outpatient setting.  3. Hypothyroidism:  The patient was continued on home Synthroid 200      mcg p.o. daily.  Repeat TSH was markedly elevated; however, the      patient has had difficulty again with adherence in the setting.      Synthroid dose was not changed at the time of discharge.  4. Hyperlipidemia:  Remains above goal as stated above.  The patient      was continued on home dose of Zocor 80 mg p.o. daily.  Lack of      reaching goal LDL also represents non adherence.  5. Asthma:  The patient remained stable throughout admission with no      requirements of albuterol.   PENDING RESOLVING ISSUES AT THE TIME OF DISCHARGE:  1. Continue to follow blood pressures metoprolol increased to 50 mg      p.o. 3 times a day and Aldomet was held at discharge.  Enalapril      and hydrochlorothiazide were continued at the time of discharge.  2. Follow up elevated TSH.  3. Follow up fasting lipid panel which was elevated while in the      hospital.   FOLLOWUP APPOINTMENT:  With Dr. Sheffield Slider on March 17, 2007 at[facility] at 9:30 in the morning.   DISCHARGE INSTRUCTIONS:  The patient is to follow low sodium, heart  healthy diet, increase activity slowly.   The patient was discharged home in stable medical condition with her  husband.      Drue Dun, M.D.  Electronically Signed      Wayne A. Sheffield Slider, M.D.  Electronically Signed   EE/MEDQ  D:  03/16/2007  T:  03/17/2007  Job:  [id number]

## 2011-01-16 NOTE — Op Note (Signed)
NAME:  [name], [name]       ACCOUNT NO.:  [id number]   MEDICAL RECORD NO.:  [id number]          PATIENT TYPE:  INP   LOCATION:  9157                          FACILITY:  [facility]   PHYSICIAN:  Tanya S. Shawnie Pons, M.D.   DATE OF BIRTH:  [birth year]   DATE OF PROCEDURE:  11/16/2008  DATE OF DISCHARGE:                               OPERATIVE REPORT   PREOPERATIVE DIAGNOSES:  Incompetent cervix and intrauterine pregnancy  at 18 weeks.   POSTOPERATIVE DIAGNOSES:  Incompetent cervix and intrauterine pregnancy  at 18 weeks.   PROCEDURE:  Rescue cerclage with a Mersilene band, the knot is at 12  o'clock.   SURGEON:  Shelbie Proctor. Shawnie Pons, MD   ANESTHESIA:  Spinal.   FINDINGS:  Cervix was 2-3 cm dilated with bulging membranes at the  external os.   SPECIMENS:  None.   ESTIMATED BLOOD LOSS:  25 mL.   COMPLICATIONS:  None known.   REASON FOR PROCEDURE:  Briefly, the patient is a 40 year old, gravida 3,  para 0, who [facility] when her cervix was found  to be open and dilated at 18 weeks.  She has a very poor history that  includes hypertension, thyroid disease, diabetes, and a second trimester  loss with PPROM at 14 weeks.  She has had a previous ultrasound that  showed a long cervix earlier in a pregnancy, but her anatomy scan of her  cervix was noted to be 3 cm long, but dilated with a large funnel down  to the external os.  The patient was admitted on the day prior to  surgery to the antenatal unit.  She was given Indocin and Mefoxin prior  to the cerclage.   PROCEDURE:  The patient was taken to the OR where she was placed in  dorsal lithotomy in Allen stirrups after spinal anesthesia.  She was  prepped and draped in the usual sterile fashion with care being taken at  the vagina.  A red rubber catheter was used to drain her bladder.  A  weighted speculum was placed inside the vagina and narrow Deaver was  used anteriorly to visualize the cervix.  The cervix was  noted to be at  least 2 cm dilated with bulging membranes at the os.  A 30 mL Foley was  then brought into the room.  An attempt was made to push the membranes  back, however, the cervix so open, this could not be easily done.  The  decision was then made to try to put in the cerclage and then push the  membranes back.  A Mersilene band was used and a pursestring was done  approximately 1.5 cm back from the external os around the cervix, going  in a 12, coming out of 10, going in a 10, coming out of  8, going in a  8, coming out of 5, going in a 5, coming out of 2, going in a 2, coming  out of 12. This was then pulled and then the 30 mL Foley catheter was  reinflated.  The balloon was used to push the membranes up above the  internal os and the cerclage was tied down.  The balloon was then de-  inflated and brought out.  The cervix was  approximately at fingertip, dilated, but felt secure and membranes were  up above the level of the cerclage.  All instruments were removed from  the vagina.  All instrument, needle, and lap counts were correct x2.  The patient was taken to recovery room in stable condition.      Shelbie Proctor. Shawnie Pons, M.D.  Electronically Signed     TSP/MEDQ  D:  11/16/2008  T:  11/17/2008  Job:  [id number]

## 2011-01-16 NOTE — Consult Note (Signed)
NAME:  Laura, [name]       ACCOUNT NO.:  [id number]   MEDICAL RECORD NO.:  [id number]           PATIENT TYPE:   LOCATION:                                 FACILITY:   PHYSICIAN:  Heather L. Rachel Bo, MD   DATE OF BIRTH:  10/20/1970   DATE OF CONSULTATION:  08/30/2008  DATE OF DISCHARGE:                                 CONSULTATION   ADDRESS:  Shelbie Proctor. Shawnie Pons, M.D.  [address]  [place], [place] [address]   BODY:  Dear Dr. Shawnie Pons:   Thank you for referring your patient, [name],  for maternal fetal medicine consultation.  As you are aware, [name] is a 40 year old, gravida 2 para 0-0-2-0 at 7 weeks' 1 day's  gestation by an ultrasound performed today.  As you are also aware, Ms.  [name] has a history of chronic hypertension, a recent CVA, and  a second trimester loss.   [name] was feeling well today and had no specific  complaints.  She had had some pelvic pain earlier in gestation which is  currently resolved.  She denied vaginal bleeding.   [name] has a history of a stroke in April of 2008.  She was  also diagnosed with hypertension at that time and was told that her  stroke was secondary to uncontrolled hypertension.  Since the time of  her diagnosis, she reports good blood pressure control and has no  residual symptoms related to her prior stroke.  She was treated with low-  dose aspirin during her most recent pregnancy but is no longer taking  that at the present time.  Additionally, she was discharged from her  neurologist and states she was told she does not need neurology  followup.   [name] also has a history of a multinodular thyroid goiter  which was treated with radioactive iodine.  At the present time, she has  been on Synthroid since that time.   [name]'s obstetric history consists of an ectopic pregnancy  which was treated with methotrexate followed by her  second pregnancy  which resulted in ruptured membranes at approximately 15 to [redacted] weeks  gestation.  [name] reports no significant symptoms  preceding this event including vaginal bleeding, a change in vaginal  discharge, or pelvic pain or pressure.  She reports getting up in the  middle of the night to go to the bathroom and experiencing a loss of a  large volume of fluid per vagina.  She presented to the hospital and  elected to proceed with a Cytotec induction.  She reports delivering her  fetus not long after presentation.  She states the fetus was normal  appearing and that she developed no complications related to this.  This  was in October of 2008.   [name] has no history of STDs, abnormal Pap smears, or  cervical or uterine procedures.  She has had an open appendectomy and a  laparoscopic cholecystectomy.  There are no other medical problems  besides those discussed above.  Her current medications include  labetalol 200 mg b.i.d., Synthroid 250 mcg daily, and a prenatal  vitamin.  She does not use tobacco, alcohol, or street drugs and she  denied any genetic conditions or congenital anomalies in either her or  the father of the baby's family.  On exam today, [name]  blood pressure is 128/83.  Her pulse was 81 beats per minute.  Her  weight was 174 pounds.  She appeared anxious but in no acute distress.   We discussed [name] medical and obstetrical history at  length.  This is summarized below along with my recommendations for  management of [name] pregnancy:  1. History of cerebrovascular accident.  It was discussed that given      [name]'s CVA was attributed to hypertension, that      blood pressure control would be essential to prevent a recurrent      event.  She does not recall having a thrombophilia workup in the      past.  We recommend following up on these results and prenatal       records indicate this was drawn today.  We would also recommend a      daily low-dose aspirin through the pregnancy and postpartum period.      An anesthesia consult in the third trimester is also recommended.      She may also benefit from a passive second-stage for delivery.  2. Chronic hypertension.  [name] appears to have good      blood pressure control on her current antihypertensives.  We would      recommend continuing these.  Some patients do require adjustments      in their antihypertensives during pregnancy and we would recommend      adjusting these to keep blood pressures less than 150/90.  The      risks of chronic hypertension and pregnancy were discussed with Ms.      [name] at length including, but not limited to, fetal      growth restriction and an increased risk for pre-eclampsia.  We      would recommend a baseline 24-hour urine as well as baseline pre-      eclampsia labs as well as serial ultrasounds to evaluate fetal      growth at least every 4 weeks through the late second and third      trimester of pregnancy with antenatal testing beginning at [redacted] weeks      gestation.  I would recommend delivery at [redacted] weeks gestation if      maternal and fetal status remains stable.  Of course, close      surveillance for development of pre-eclampsia will be necessary.      The signs and symptoms of this were reviewed with [name] today.  3. Advanced maternal age.  [name]'s increased risk of      aneuploidy was discussed today.  The options of screening as well      as diagnostic testing for fetal aneuploidy were reviewed.  She      would like to proceed with amniocentesis and this was scheduled at      [redacted] weeks gestation along with formal genetic counseling      appointment.  Ultrasound for fetal anatomic survey was scheduled at      that time as well.  4. Thyroid disease.  [name] should have a TSH evaluated       each trimester of gestation.  Synthroid can be adjusted to keep her  euthyroid.  At times, patients do require adjustments during      gestation and uncontrolled hypothyroidism can be a cause of fetal      morbidity.  5. Second trimester loss.  The events surrounding [name]      Snowberger's previous second trimester delivery are somewhat unclear.      We would recommend review of these records and if, indeed, her      clinical picture appears most consistent with cervical      insufficiency and not infection, abruption, or preterm labor, she      may benefit from a prophylactic cerclage.  Alternatively, serial      cervical lengths could be done through the second and early third      trimester.  Both of these options were discussed with [name] at length and she would defer to our recommendations.  We      scheduled a cervical length measurement with Ms. Saucedo-Clinch's      follow-up appointment at [redacted] weeks gestation.  However, should her      records indicate that she is a candidate for a prophylactic      cerclage, this could be placed at 13 to [redacted] weeks gestation.   Thank you for allowing [place] to participate in the care of [name].  Please feel free to contact us at any time regarding this or  any other patient you may have.   Sincerely,           ______________________________  Macarthur Critchley. Rachel Bo, MD     HLM/MEDQ  D:  08/30/2008  T:  08/30/2008  Job:  [id number]

## 2011-01-16 NOTE — Group Therapy Note (Signed)
NAME:  [name], [name]       ACCOUNT NO.:  [id number]   MEDICAL RECORD NO.:  [id number]          PATIENT TYPE:  WOC   LOCATION:  [facility]                   FACILITY:  [facility]   PHYSICIAN:  Scheryl Darter, MD       DATE OF BIRTH:  [birth year]   DATE OF SERVICE:  01/06/2009                                  CLINIC NOTE   The patient comes for followup after a 19-week miscarriage.  She was  diagnosed with incompetent cervix.  A cerclage was placed at 18 weeks,  but she presented with repeat membranes afterwards.  The pathology on  the placenta showed some adherent clot and could not rule out abruption.  She wished to conceive again.  She is hypertensive and is on labetalol.  She is hypothyroid.  The patient has history of a stroke.   PHYSICAL EXAMINATION:  GENERAL:  No acute distress.  Normal affect.  Interpreter was present during examination.  ABDOMEN:  Soft, nontender, no mass.  EXTERNAL GENITALIA:  Vagina and cervix appeared normal.  Uterus normal  size.  No adnexal masses.   IMPRESSION:  1. Normal gynecologic exam after second trimester miscarriage.  2. Incompetent cervix.  3. Hypertension.   PLAN:  She will follow with [facility] for control of her  blood sugar and management of her hyperthyroidism.  She plans to  conceive again.  She should register early if she does conceive and  should be a candidate for early second trimester cerclage.      Scheryl Darter, MD     JA/MEDQ  D:  01/06/2009  T:  01/07/2009  Job:  [id number]

## 2011-01-16 NOTE — Discharge Summary (Signed)
[name], [name]       ACCOUNT NO.:  [id number]   MEDICAL RECORD NO.:  [id number]           PATIENT TYPE:   LOCATION:                                 FACILITY:   PHYSICIAN:  Allie Bossier, MD        DATE OF BIRTH:  03-18-1971   DATE OF ADMISSION:  11/22/2008  DATE OF DISCHARGE:  11/23/2008                               DISCHARGE SUMMARY   DISCHARGE DIAGNOSES:  1. Preterm premature rupture of membranes at 34 weeks' gestational      age.  2. Incompetent cervix.  3. Status post spontaneous vaginal delivery of a nonviable infant.   REASON FOR ADMISSION:  [name] is a 40 year old gravida 3,  para 0-0-3-0, who presented to the MAU complained of rupture of  membranes.  She was previously admitted for advanced cervical dilation  at 18 weeks and at that time, the cerclage was placed.  However, on the  day of admission, she presented to the MAU with the cerclage in place,  but it was found that her membranes were ruptured.  This was verified by  pooling, ferning, an ultrasound that revealed oligohydramnios.  The  patient was counseled extensively on her options and the patient agreed  to be admitted and induced.   HOSPITAL COURSE:  The patient was admitted to the AICU where she was  given some vaginal Cytotec and she will be thereafter delivered a  nonviable female infant over an intact perineum.  Her placenta delivered  several hours later spontaneously.  The patient was transferred to the  mother- baby ward where she had an uncomplicated course.  Her blood  pressures were in good control.  At the time of discharge, her pain is  minimal.  Her bleeding is decreasing and the patient signed to go home.   Findings ultrasound as reported above, a nonviable female infant born at  23 weeks' gestational age.   PROCEDURE:  Spontaneous vaginal delivery.   Condition at discharge is good.   MEDICATIONS:  1. Ibuprofen.  2. Percocet #3.   Outpatient medications as per  previous;  1. Synthroid 225 mcg daily.  2. Labetalol 400 mg daily.  3. Glyburide 2.5 mg daily.  4. Baby aspirin daily.   DISCHARGE INSTRUCTIONS:  The patient is instructed to [facility] for postpartum exam.  The patient was also  instructed to see her outpatient medications.  We will stop glyburide at  this time and she will have a 2-hour glucose tolerance test at her 6-  week visit.  All these instructions were explained with interpreter, the  patient voiced understanding.  Her questions were answered.      Odie Sera, DO  Electronically Signed     ______________________________  Allie Bossier, MD    MC/MEDQ  D:  11/23/2008  T:  11/23/2008  Job:  [id number]

## 2011-01-16 NOTE — Discharge Summary (Signed)
NAME:  [name], [name]       ACCOUNT NO.:  [id number]   MEDICAL RECORD NO.:  [id number]           PATIENT TYPE:   LOCATION:                                 FACILITY:   PHYSICIAN:  Tanya S. Shawnie Pons, M.D.   DATE OF BIRTH:  1971-01-03   DATE OF ADMISSION:  11/15/2008  DATE OF DISCHARGE:  11/17/2008                               DISCHARGE SUMMARY   FINAL DIAGNOSES:  1. Intrauterine pregnancy at 18 weeks.  2. Incompetent cervix with advanced cervical dilation.  3. Pregestational diabetes mellitus.  4. Chronic hypertension.  5. History of a stroke.  6. Hypothyroidism secondary to radioactive iodine ablation for      multinodular goiter.  7. History of an ectopic.  8. Polycystic ovarian syndrome.  9. History of second trimester loss.  10.Low platelets, unclear etiology.   PROCEDURES:  Rescue cerclage, bedrest x48 hours, IV antibiotics, and 48  hours of Indocin.   PERTINENT LABS:  White count of 10.4, hemoglobin 13.6, and platelet  count of 135.   REASON FOR ADMISSION:  Briefly, the patient is a 40 year old gravida 3,  para 0-0-2-0, who has a history of 14 wk loss who  had an anatomy scan,  was found to have an open cervix that was 3 cm long and dilated with a  funnel to the external os.  The patient's pregnancy was also complicated  by diabetes, hypertension, hypothyroidism, history of stroke, and  advanced maternal age.  She has a history of PCOS and asthma.  The  patient was admitted for bedrest and for rescue cerclage placement.   HOSPITAL COURSE:  The patient was admitted to the antenatal unit where  she was watched for 24 hours and MFM consult was obtained.  They  recommended rescue cerclage.  In addition, IV antibiotics, PR Indocin,  on-call to the OR, and then completion of 24 hours of IV Mefoxin and 48  hours of p.o. Indocin.  On hospital day #2, the patient was taken to the  OR for rescue cerclage.  At the time of cerclage, the external os was  felt to be 2-3 cm  dilated with membranes of the os.  A difficult  cerclage was placed and then a 30 mL Foley balloon was used to move the  membranes out of the way and then the knot was tied down.  Postoperatively, the patient did contract some.  She was continued on  p.o. Indocin and IV Mefoxin for 24 hours.  The patient did require one  dose of Percocet in the evening, then her pain has been well controlled  since then.  The patient has had excellent blood sugar control as well  as blood pressure control throughout her hospitalization.  The patient  was felt stable for discharge at 24 hours postop.   DISCHARGE DISPOSITION AND CONDITION:  The patient was discharged home in  improved condition.  Followup will be in [facility] on Monday,  November 22, 2008, at 9 a.m.  The patient was instructed to return with any  signs or symptoms with labor including bleeding, leakage of fluid,  fevers or chills or significant abdominal  pain.  Risks of this procedure  failing were discussed with the patient with interpreter had a present  today.   DISCHARGE MEDICATIONS:  1. Indocin 25 mg p.o. q.6 h., Tums the next 24 hours.  She was given      #4 with no refill.  2. She was also to resume home medications to include Levoxyl 225 mcg      one p.o. daily.  3. Labetalol 400 mg one p.o. b.i.d.  4. Glyburide 2.5 mg p.o. nightly.  5. Baby aspirin 81 mg one p.o. daily.  6. Prenatal vitamin one p.o. daily.   She is to resume diabetic diet and to check her blood sugars fasting in  2 hours post prandial.       Tanya S. Shawnie Pons, M.D.  Electronically Signed     TSP/MEDQ  D:  11/17/2008  T:  11/18/2008  Job:  [id number]

## 2011-01-16 NOTE — H&P (Signed)
[name], [name]               ACCOUNT NO.:  [id number]   MEDICAL RECORD NO.:  [id number]          PATIENT TYPE:  INP   LOCATION:  2038                         FACILITY:  [facility]   PHYSICIAN:  [name], M.D.    DATE OF BIRTH:  10-14-1970   DATE OF ADMISSION:  03/15/2007  DATE OF DISCHARGE:  03/16/2007                              HISTORY & PHYSICAL   PRIMARY CARE PHYSICIAN:  Dr. Zachery Dauer with the family practice  teaching service.   CHIEF COMPLAINT:  Dizziness and blurry vision.   HISTORY OF PRESENT ILLNESS:  The patient is a 40 year old Spanish-  speaking female with history of hypertension, hyperlipidemia as well as  a CVA in April 2008 who presented to the emergency department tonight  with complaints of blurry vision, dizziness, headache, nausea, as well  as throbbing pain in her right arm.  She states that the symptoms are  very similar to her previous CVA back in April.  The symptoms began this  morning when she woke up and resolved soon after when she did took a  nap.  When she woke up several hours later the symptoms recurred, which  prompted her to come to the emergency department.  She describes the  headache as unilateral on the right side of her head and throbbing.  It  feels better since getting morphine in the emergency department.  She  has mild photophobia as well as nausea.  She denies any vomiting.  She  denies having a personal history of migraines.  She also has blurry  vision which she feels is worse in her right eye.  She notes mild  weakness in her right hand in addition to the pain sensations.  She  describes the dizziness as the room spinning.  She denies slurred  speech, facial droop or difficulty swallowing.  She has been taking all  her medications including aspirin.  Of note, history was obtained using  the language line.   PAST MEDICAL HISTORY:  1. 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  2. Depression.  3. Polycystic ovarian  syndrome.  4. Obesity.  5. Hypothyroidism.  6. Hypertension.  7. Hyperlipidemia.  8. Asthma.   MEDICATIONS:  1. Albuterol MDI two puffs q.4h. p.r.n.  2. Aldomet 250 mg p.o. b.i.d.  3. Enalapril 10 mg p.o. daily.  4. Hydrochlorothiazide 12.5 mg p.o. daily.  5. Metoprolol 50 mg p.o. b.i.d.  6. Simvastatin 80 mg p.o. q.h.s.  7. Synthroid 200 mcg each morning.  8. Aspirin 325 mg.   DRUG ALLERGIES:  No known drug allergies.   PAST SURGICAL HISTORY:  1. Appendectomy 1997.  2. Cholecystectomy 1997.  3. Exploratory laparotomy for adhesions with obstruction.  4. Ovarian cystectomy.   FAMILY HISTORY:  Mom with hypertension.  Father with hemorrhagic CVA.   SOCIAL HISTORY:  She is married with her husband, Maureen Ralphs.  She has lived  for the past year-and-a-half in [place], the previous 7 years  lived in [place].  She does not have any children.  She is currently  working on becoming a [place]  citizen.  She is a nonsmoker.  She  has occasional alcohol use.   REVIEW OF SYSTEMS:  No fever, chest pain, dyspnea on exertion, abdominal  pain or depression.  No neck stiffness.  Remainder as per HPI.   PHYSICAL EXAMINATION:  VITAL SIGNS:  Temperature 98.1, pulse 92, blood  pressure 145/100, respiratory rate 20, 98% on room air.  GENERAL:  Well-developed, well-nourished, in no acute distress.  HEENT:  Pupils are equally round and reactive to light and  accommodation.  Extraocular motion is intact.  The patient is grossly  normal.  Funduscopic exam normal.  Tympanic membranes normal  bilaterally.  Oral mucosa is pink and moist.  NECK:  No nuchal rigidity.  No thyromegaly or thyroid nodules.  No  carotid bruits.  LUNGS:  Clear to auscultation bilaterally.  No increased work of  breathing.  HEART:  Regular rate and rhythm.  No murmurs, rubs or gallops.  ABDOMEN:  Positive bowel sounds, soft, nontender, nondistended, mildly  obese.  EXTREMITIES:  No cyanosis, clubbing or edema.   NEUROLOGIC:  Alert and x3.  Cranial nerves II-XII are grossly intact.  She has 5/5 strength in both upper and lower extremities.  Normal grip  bilaterally.  Sensation is intact to light touch throughout.  Deep  tendon reflexes are 2+ throughout and symmetrical.  Finger-to-nose  normal.  Negative pronator drift.  Toes are downgoing on Babinski.  Negative Romberg.  Negative Dix-Hallpike maneuver.  Normal rapid  alternating movements.  Normal speech.   LABORATORY DATA:  Sodium 134, potassium 3.1, chloride 106, bicarb 23,  BUN 19, creatinine 0.69, glucose 158, calcium 8.9.  White blood cell  count 10.0, hemoglobin 13.3, platelets 161.  CT of head shows her left  basal ganglia infarct with vague subtle hypodensity in the posterior  limb of the right internal capsule which potentially may represent an  acute lacunar infarct.   ASSESSMENT/PLAN:  A 40 year old female with history of cerebrovascular  accident in April 2008, now with dizziness, blurry vision, headache and  right arm pain.   1. Neurologic:  The patient with dizziness, unilateral headache,      nausea, blurry vision, right arm pain, nonfocal neurologic      examination.  Head CT with questionable acute lacunar infarction in      the posterior limb of right internal capsule.  Differential      diagnosis includes TIA versus CVA versus migraine with aura versus      vertigo.  Will observe overnight with frequent neuro checks.  Will      start Aggrenox, as the patient is already on aspirin.  If symptoms      persist, then will consider MRI to further evaluate the new lesion      seen on CAT scan.  She had normal echocardiogram in April 2008 so      will not repeat this.  She is outside of tPA window so we will      aggressively manage her risk factors.  Will check a homocysteine      level.  Will treat her headache with pain medications as needed and      will treat nausea with Phenergan as needed.  2. Hypertension:  Blood pressure  slightly above goal.  We will hold      blood pressure medicines for now in case of CVA unless blood      pressures elevate to greater than 220/120.  Will likely restart her  blood pressure medicines tomorrow if neurologic exam remains      nonfocal.  3. Hypothyroidism:  Her Synthroid dose was decreased to 200 mcg one      month ago.  Will repeat her TSH.  4. Hyperlipidemia:  LDL of 191 when last checked in April.  Will      continue Zocor.  Will check a fasting lipid panel.  5. Hypokalemia:  This was replaced emergency in the emergency      department.  Will recheck a BMET in the morning.  6. Asthma:  No acute issues currently.  We will use albuterol p.r.n.      if needed.      Benn Moulder, M.D.  Electronically Signed      [name], M.D.  Electronically Signed    MR/MEDQ  D:  03/16/2007  T:  03/17/2007  Job:  [id number]

## 2011-01-16 NOTE — Discharge Summary (Signed)
NAME:  [name], [name]          ACCOUNT NO.:  [id number]   MEDICAL RECORD NO.:  [id number]          PATIENT TYPE:  WOC   LOCATION:  WOC                          FACILITY:  [facility]   PHYSICIAN:  Norton Blizzard, MD    DATE OF BIRTH:  28-Apr-1971   DATE OF ADMISSION:  08/14/2007  DATE OF DISCHARGE:  08/15/2007                               DISCHARGE SUMMARY   ADMISSION DIAGNOSES:  1. Post partum day #6 from delivery of a [redacted] week gestation.  2. Abdominal pain.  3. Vaginal bleeding.   DISCHARGE DIAGNOSES:  1. Post partum day #7 from vaginal delivery of a [redacted] week gestation.  2. Vaginal bleeding, improved.  3. Abdominal pain, improved.  Possible endometritis.   PROCEDURES:  Patient had a transabdominal and transvaginal pelvic  ultrasound performed showing blood products identified within the  endocervical canal and extending slightly into the lower uterine  segment.  There was no sonographic evidence to suggest retained products  of conception.   COMPLICATIONS:  None.   CONSULTATIONS:  None.   PERTINENT LABORATORY FINDINGS:  Upon admission, the patient had a CBC  showing a white blood cell count of 7.7, hemoglobin 12.2, hematocrit  35.6, platelet count 190.  She had a creatinine during her stay of 0.64.   BRIEF PERTINENT ADMISSION HISTORY:  This is a 40 year old G2, P0-0-2-0  that is post partum day #6 from an induced spontaneous vaginal delivery,  [redacted] week gestation, after spontaneous rupture of membranes.  She was  induced with Cytotec and delivered on December 5th.  At that time, it  was documented that all products of conception had passed, and the  placenta was intact.  She presented reporting increasing abdominal pain  and increased bleeding over the past day.  She had a tender abdomen on  exam, and there was concern for endometritis.  She is afebrile at the  time of admission with a white blood cell count of 7.7.   HOSPITAL COURSE:  Patient was admitted for observation.   She remained  afebrile throughout her stay.  She was started on antibiotics with  ampicillin, gentamicin, and clindamycin.  She received three doses of  ampicillin and one dose of clindamycin/gentamicin.  Her abdominal pain  improved during her stay.  Her bleeding had become minimal prior to  discharge.  Her examination was normal at the time of discharge with  normal breath sounds, heart sounds, and abdominal examination.  She has  had some tenderness in the suprapubic region.  Fundus was firm.  She was  noted to have elevated blood pressures to the 140s-150s/90s-100 range.  She was started on hydrochlorothiazide with good response.  Blood  pressure at the time of discharge was 127/89.  She was stable and ready  for discharge.   DISCHARGE CONDITION:  Stable.   DISCHARGE MEDICATIONS:  1. Hydrochlorothiazide 25 mg 1 tablet p.o. daily.  2. Doxycycline 100 mg 1 tablet p.o. twice daily x14 days.  3. Continue on labetalol 200 mg p.o. 3 times daily.  4. Continue home Synthroid 250 mcg 1 tablet p.o. daily.   DISCHARGE INSTRUCTIONS:  1. Discharge to  home.  2. Regular diet.  3. Activity as tolerated.  She is to have nothing per vagina for six      weeks post partum.  4. She is to follow up with her primary care physician for blood      pressure check next week.  She is to otherwise follow up at six      weeks post partum at the [facility].      Karlton Lemon, MD      Norton Blizzard, MD  Electronically Signed    NS/MEDQ  D:  08/15/2007  T:  08/15/2007  Job:  [id number]

## 2011-01-19 NOTE — Discharge Summary (Signed)
NAMECARMYN, HAMM               ACCOUNT NO.:  [id number]   MEDICAL RECORD NO.:  [id number]          PATIENT TYPE:  INP   LOCATION:  5506                         FACILITY:  [facility]   PHYSICIAN:  Wayne A. Sheffield Slider, M.D.    DATE OF BIRTH:  Jan 08, 1971   DATE OF ADMISSION:  12/12/2006  DATE OF DISCHARGE:                               DISCHARGE SUMMARY   DISCHARGE DIAGNOSES:  1. Cerebrovascular accident.  2. 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  3. Hypertension.  4. Hypothyroidism.   DISCHARGE MEDICATIONS:  1. Aspirin 325 mg p.o. daily.  2. Enalapril 10 mg p.o. daily.  3. Synthroid 200 mcg p.o. daily.  4. Zocor 80 kg p.o. q.h.s.  5. Metoprolol held during inpatient setting.   IMAGING AND STUDIES:  1. CT of the head without contrast with area of hypoattenuation of the      left lentiform nucleus extending across the anterior limb of the      internal capsule to the caudate head.  Suspicious for subacute      ischemic lesion.  2. MRI/MRA of the head:  Left basal ganglia subacute infarct.  Normal      variant MRA circle of Willis.   PERTINENT LABORATORIES:  LDL 191, HDL 35; lipids were drawn nonfasting  so triglycerides were nondiagnostic.  CBC, CMET and PT otherwise normal.  Hypercoagulability workup not initiated secondary to financial  constraints on the patient.   BRIEF HOSPITAL COURSE:  Please see H&P for additional detail.  Briefly,  the patient is a 40 year old hypertensive hypothyroid patient who came  in with persistent frontal headache and neurologic symptoms.   #1 - CEREBROVASCULAR ACCIDENT.  The patient diagnosed with subacute CVA  as previously noted.  Timing of insult outside of acute therapeutic  window.  The patient was observed and monitored with immediate and  progressive resolution of symptoms, arriving at baseline on hospital day  #2.  The patient was observed for additional night with complete  resolution of symptoms and improvement of headache.  At  this time,  extracranial embolic source not identified.  The patient should have a  hypercoagulability workup.  This was discussed initially with the  patient but as she is entirely uninsured we had concerns over an  extensive expensive workup and discussed this with the patient.  Social  work was consulted and emergency Medicaid application was placed.  Medicaid application pending.   #2 - HYPOTHYROIDISM.  The patient came in with a dose of 300 mcg of  Synthroid.  Upon review of standard Synthroid dosing in [facility], it is  generally recommended that the usual patient dose be equal to or less  than 200 mcg per day.  In very rare cases, doses of 300 mcg per day have  been reported but those are generally in the context of undiagnosed poor  compliance, malabsorption, or other drug interactions.  Based on these  general guidelines, the patient's Synthroid dose was decreased to 200  mcg per day.  Should dose be increased as an outpatient, and given the  patient's nonadherence to all of her other  medications as well, it is  recommended that Synthroid dose be increased with care.   #3 - HYPERTENSION.  The patient came in grossly hypertensive with a  prior right medication regimen that included beta blocker and ACE  inhibitor.  The patient was restarted on enalapril in the hospital and  hydrochlorothiazide was added.  The patient's metoprolol was held.  Given the patient's cost constraints, it may not be unreasonable to  consider titrating a single agent for the addition of a second agent.  In addition, should beta blocker be restarted as an outpatient,  carvedilol is now generic and its combined alpha and beta effects  provide a more broad and effective antihypertensive affect.  Would  recommend consideration of switching to that agent.   FOLLOWUP ISSUES:  The patient discharged from the inpatient service on a  Saturday morning.  The patient was given telephone number and  instructions to  call [facility] on Monday morning for a work-  in appointment for close outpatient followup.      Towana Badger, M.D.    ______________________________  Arnette Norris. Sheffield Slider, M.D.    JP/MEDQ  D:  12/14/2006  T:  12/14/2006  Job:  [id number]

## 2011-01-19 NOTE — H&P (Signed)
[name], [name]               ACCOUNT NO.:  [id number]   MEDICAL RECORD NO.:  [id number]          PATIENT TYPE:  INP   LOCATION:  5506                         FACILITY:  [facility]   PHYSICIAN:  [name], M.D.    DATE OF BIRTH:  November 30, 1970   DATE OF ADMISSION:  12/12/2006  DATE OF DISCHARGE:                              HISTORY & PHYSICAL   PRIMARY CARE PHYSICIAN:  Dr. Zachery Dauer at [facility].   CHIEF COMPLAINT:  Headache and neurologic symptoms.   HISTORY OF PRESENT ILLNESS:  This is a 40 year old, Hispanic female who  was seen at the [facility] yesterday by Dr. Alfonse Ras-  [name] for high blood pressure and found to have neurologic symptoms.  The  patient's blood pressure was significantly elevated in clinic to  214/116.  It came down to 146/88 after 0.2 mg of clonidine.  There was  confusion over the patient's home medications at this time and it  appeared that the patient had run out or was not taking them regularly.  The patient was sent for head CT from clinic which revealed a lesions  suspicious for subacute infarct.  The patient was contacted today by  phone and advised to come to the ED for MRI/MRA.   The patient reports neurologic symptoms on and off along with a  persistent frontal headache since Monday.  Her symptoms are intermittent  and overall are improving.  She complains of decreased sensation and  tingling in her left face with mild right facial weakness and left eye  scotoma.  She also complains of difficulty articulating words at times  as well as some difficulty swallowing at times.  She reports normal  sensation elsewhere in her body, but complains of some dysarthria and  weakness in her right hand.  Of note, she is right-handed.  She also has  dizziness and feels off balance while walking.  The patient has not  tried any medications at home for her headache, but did receive some  relief after taking the blood  pressure medicine in clinic.   The patient reports her increased blood pressure is likely due to stress  as she has recently lost her father to a hemorrhagic stroke.  Today, the  patient reports full compliance with her medications including that for  her thyroid.   HOME MEDICATIONS:  1. Methyldopa 250 mg p.o. b.i.d.  2. Synthroid 300 mcg p.o. daily.  3. Enalapril 10 mg p.o. daily.  4. Metoprolol 100 mg 1/2 tablet p.o. b.i.d.   PAST MEDICAL HISTORY:  1. Infertility due to polycystic ovarian syndrome.  2. Obesity.  3. History of irregular menses.  4. Hypothyroidism, secondary to radioactive iodine thyroid ablation.  5. Hypertension.  6. Hyperlipidemia.  7. Asthma.  8. History of multinodular goiter treated with ablated therapy.   PAST SURGICAL HISTORY:  1. Status post appendectomy in 1997.  2. Status post cholecystectomy in 1997.  3. Status post ovarian cystectomy.  4. Status post radioactive iodine ablation treatment for      hyperthyroidism secondary to multinodular goiter.  5. Status post exploratory laparotomy which revealed adhesions with      obstruction.   FAMILY HISTORY:  The patient's mother has hypertension and the patient's  father recently died of hemorrhagic stroke.   SOCIAL HISTORY:  The patient is married.  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  She lived x7 years in [place] prior to moving  to [place], 1-2 years ago.  The patient is currently working on  her paperwork for documentation.  She denies tobacco use and uses  alcohol occasionally.  She had an ectopic pregnancy in August 2007.   REVIEW OF SYSTEMS:  GENERAL:  The patient complains of fatigue, but  denies fever or chills.  HEENT:  Denies pain or red eyes.  ENT:  See  HPI.  Complains of difficulty swallowing.  Denies nasal congestion and  sore throat.  CARDIOVASCULAR:  Complains of occasional palpitations.  Denies chest pain or discomfort.  RESPIRATORY:  Denies shortness of   breath.  GI:  Complains of nausea, but denies abdominal pain.  GU:  Denies incontinence.  DERMATOLOGY:  Denies rash.  NEUROLOGIC:  See HPI.   PHYSICAL EXAMINATION:  VITAL SIGNS:  Temperature is 98.6, pulse is 69-  92, respirations are 16-20, blood pressure is 150/90-95, oxygen  saturation is 98% on room air.  GENERAL:  The patient is alert, well-developed, well-nourished in no  acute distress.  HEENT:  Head is normocephalic and atraumatic.  No facial asymmetry  noted.  Eyes have vision grossly intact.  Pupils are equal, round and  reactive to light and accommodation bilaterally with no injection and no  nystagmus.  Ears have no external deformities.  Nose has no external  deformity nasal discharge.  Mouth with pharynx pink and moist with no  erythema or exudates and no tongue  abnormalities.  NECK:  Supple with full range of motion.  No masses, no thyromegaly and  no carotid bruits.  CHEST:  Chest wall has no deformities and symmetric movement  bilaterally.  LUNGS:  Normal respiratory effort with no accessory muscle use and  normal breath sounds with no crackles or wheezes.  HEART:  Regular rate and rhythm with no murmurs, rubs or gallops.  ABDOMEN:  Soft, nontender and nondistended with normal bowel sounds and  no rebound or guarding.  MUSCULOSKELETAL:  The patient has normal range of motion with no joint  swelling.  The patient has 2+ bilateral dorsalis pedis pulses.  EXTREMITIES:  No clubbing, cyanosis or edema or deformity noted.  NEUROLOGIC:  The patient is alert and oriented x3 and cranial nerves II-  XII are grossly intact, except for decreased sensation of left face and  mild weakness of right face.  The patient has normal elevation of uvula.  Speech is clear.  Strength is normal in all extremities.  Sensation is  intact to light touch throughout other than left face.  DTRs are symmetric and normal with no clonus bilaterally.  Finger-to-nose is  normal.  Rapid alternating  movements are normal, but more difficult with  the patient's right hand.  The patient is able to swallow sips of water  without choking or coughing.  SKIN:  Normal turgor, color and no rashes or edema.  CERVICAL NODES:  The patient has no cervical adenopathy.  PSYCHIATRIC:  The patient is normally interactive and has good eye  contact.   LABORATORY DATA:  CBC revealed a white blood cell count of 7.4,  hemoglobin 13.2, hematocrit 39.8, platelets of 167  and no neutrophilic  shift.  Coagulations are normal.  PT of 12.6, INR of 0.9, PTT of 29.  Basic metabolic panel reveals normal electrolytes with sodium 140,  potassium 3.5, chloride of 105, bicarb of 28, BUN of 13, creatinine is  0.79, glucose of 108 and calcium is 8.6.  Total protein is 6.6 and  albumin 3.4.  LFTs are within normal limits.  TSH from clinic was  markedly elevated at 25.95.  This was previously normal in January 2008.   CT of the head revealed an ovoid area of hypoattenuation in the left  lentiform nucleus extending across the anterior limb of the internal  capsule to the caudate head.  Nonspecific, but likely represents  subacute ischemic lesion.  MRI was recommended for confirmation.   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  No bleed or shift.  No significant  proximal stenosis.   ASSESSMENT/PLAN:  This is a 40 year old, Hispanic female who was sent  from [facility] after abnormal head CT and confirmed to have  an acute ischemic infarct on MRI in the emergency department here.   1. New acute ischemic infarct.  Left facial numbness, right facial      weakness right hand dysarthria are consistent with acute ischemic      infarct as identified on MRI.  Will admit the patient to telemetry      bed.  Given the young age and lack of risk factors other than      poorly-controlled hypertension, will work up for potential causes.      Will monitor the  patient's blood pressures and take care not to      drop to quickly in setting of acute stroke.  Will order carotid      Dopplers, 2-D echocardiogram, telemetry to monitor for arrhythmias      particularly atrial fibrillation, fasting lipid panel in the      morning, swallow evaluation per speech therapy.  Will start aspirin      325 mg p.o. daily.  Will discuss with team and await possible      emergency medication prior to doing any further laboratory workup      such as hypercoagulable states and homocystine levels.  2. Hypothyroidism.  The patient's TSH is significantly elevated in      clinic, but was normal in January 2008.  Suspect possible      nonadherence.  Will continue the patient's current home dose of      Synthroid 300 mcg p.o. daily for now.  3. Hypertension.  Poorly-controlled, but improved from clinic      yesterday.  Given the patient's current blood pressure in the     150s/90s and in the setting of acute stroke, we will take caution      not to lower blood pressure too rapidly.  Will hold all home blood      pressure medicines for now.  Will ask R.N. to call if blood      pressure greater than 160/110.  Will consider restarting home dose      enalapril 10 mg p.o. daily as first blood pressure medicine if      elevated given the patient's acute stroke.  4. Headache.  This is likely related to acute stroke and uncontrolled      hypertension.  Will give Tylenol 1000 mg p.o. q.6 h. as needed and      consider something stronger if  this is ineffective.  5. Fluids, electrolytes and nutrition/gastrointestinal.  Will allow      the patient to have sips of water and ice chips ad lib and for now.      Will otherwise keep nothing per mouth until speech therapy      evaluates swallow function in morning.  Electrolytes are currently      stable.  6. Prophylaxis.  Will place stocking compression device bilaterally to      lower extremities.  7. Disposition.  Will order care  management consult to evaluate for      possibility of emergency Medicaid given the patient does not have      insurance.     ______________________________  Drue Dun, M.D.    ______________________________  Arnette Norris. Sheffield [name], M.D.    EE/MEDQ  D:  12/12/2006  T:  12/13/2006  Job:  [id number]

## 2011-02-02 ENCOUNTER — Inpatient Hospital Stay (HOSPITAL_COMMUNITY)
Admission: AD | Admit: 2011-02-02 | Discharge: 2011-02-02 | Disposition: A | Discharge status: Home / Self Care | Payer: 59 | Source: Ambulatory Visit | Attending: Obstetrics & Gynecology | Admitting: Obstetrics & Gynecology

## 2011-02-02 LAB — CBC
HCT: 33.8 % — ABNORMAL LOW (ref 36.0–46.0)
Hemoglobin: 10.5 g/dL — ABNORMAL LOW (ref 12.0–15.0)
MCH: 24.5 pg — ABNORMAL LOW (ref 26.0–34.0)
MCHC: 31.1 g/dL (ref 30.0–36.0)

## 2011-02-07 ENCOUNTER — Ambulatory Visit (INDEPENDENT_AMBULATORY_CARE_PROVIDER_SITE_OTHER): Payer: 59 | Admitting: Obstetrics and Gynecology

## 2011-02-08 NOTE — Group Therapy Note (Signed)
NAME:  [name], [name]       ACCOUNT NO.:  [id number]  MEDICAL RECORD NO.:  [id number]           PATIENT TYPE:  A  LOCATION:  [facility]                   FACILITY:  [facility]  PHYSICIAN:  Argentina Donovan, MD        DATE OF BIRTH:  1971-02-21  DATE OF SERVICE:  02/07/2011                                 CLINIC NOTE  The patient is a 40 year old Spanish-speaking Hispanic female gravida 4, para 0-1-3-1 delivered by cesarean section for abruptio placenta on December 24, 2010, at 27 weeks' gestation.  The baby weighed slightly over 1 pound, is now 2-1/2 pounds in the nursery and is doing well.  The patient has been on heparin throughout her pregnancy and postpartum up until several days ago when she stopped that. She was on that because of a history of the CVA many years ago.  She had been trying to get pregnant for 17 years.  She had a tubal ligation at the time of her C- section.  Report was that she had, had bleeding and it had not stopped since delivery.  However, having stopped the heparin 3 days ago, the bleeding has completely stopped at this point.  On examination, the abdomen is soft, flat and nontender.  No masses or organomegaly.  External genitalia is normal.  BUS within normal limits. The vagina is clean and well rugated.  Cervix clean, nulliparous. Uterus anterior with normal size, well-consistency, well-involuted and the adnexa is normal.  IMPRESSION:  Normal postpartum exam.  The patient had a CBC noted in the MAU several days ago with a hematocrit of 33.  She is on iron therapy at this time.          ______________________________ Argentina Donovan, MD    PR/MEDQ  D:  02/07/2011  T:  02/08/2011  Job:  [id number]

## 2011-02-26 ENCOUNTER — Inpatient Hospital Stay (HOSPITAL_COMMUNITY): Payer: 59

## 2011-02-26 ENCOUNTER — Inpatient Hospital Stay (HOSPITAL_COMMUNITY)
Admission: AD | Admit: 2011-02-26 | Discharge: 2011-02-26 | Disposition: A | Discharge status: Home / Self Care | Payer: 59 | Source: Ambulatory Visit | Attending: Obstetrics & Gynecology | Admitting: Obstetrics & Gynecology

## 2011-02-26 DIAGNOSIS — N949 Unspecified condition associated with female genital organs and menstrual cycle: Secondary | ICD-10-CM | POA: Insufficient documentation

## 2011-02-26 DIAGNOSIS — N938 Other specified abnormal uterine and vaginal bleeding: Secondary | ICD-10-CM | POA: Insufficient documentation

## 2011-02-26 LAB — CBC
HCT: 33.4 % — ABNORMAL LOW (ref 36.0–46.0)
MCV: 77.7 fL — ABNORMAL LOW (ref 78.0–100.0)
RBC: 4.3 MIL/uL (ref 3.87–5.11)
WBC: 7 10*3/uL (ref 4.0–10.5)

## 2011-02-26 LAB — WET PREP, GENITAL
Clue Cells Wet Prep HPF POC: NONE SEEN
WBC, Wet Prep HPF POC: NONE SEEN

## 2011-02-27 LAB — GC/CHLAMYDIA PROBE AMP, GENITAL
Chlamydia, DNA Probe: NEGATIVE
GC Probe Amp, Genital: NEGATIVE

## 2011-02-28 ENCOUNTER — Ambulatory Visit: Payer: 59 | Admitting: Family Medicine

## 2011-03-02 ENCOUNTER — Other Ambulatory Visit: Payer: 59

## 2011-03-02 DIAGNOSIS — I10 Essential (primary) hypertension: Secondary | ICD-10-CM

## 2011-04-02 ENCOUNTER — Encounter: Payer: Self-pay | Admitting: Family Medicine

## 2011-04-03 ENCOUNTER — Encounter: Payer: Self-pay | Admitting: Family Medicine

## 2011-04-03 ENCOUNTER — Ambulatory Visit (INDEPENDENT_AMBULATORY_CARE_PROVIDER_SITE_OTHER): Payer: 59 | Admitting: Family Medicine

## 2011-04-03 VITALS — BP 180/130 | HR 87 | Wt 157.2 lb

## 2011-04-03 DIAGNOSIS — E663 Overweight: Secondary | ICD-10-CM

## 2011-04-03 DIAGNOSIS — D649 Anemia, unspecified: Secondary | ICD-10-CM | POA: Insufficient documentation

## 2011-04-03 DIAGNOSIS — O24419 Gestational diabetes mellitus in pregnancy, unspecified control: Secondary | ICD-10-CM

## 2011-04-03 DIAGNOSIS — O99810 Abnormal glucose complicating pregnancy: Secondary | ICD-10-CM

## 2011-04-03 DIAGNOSIS — F329 Major depressive disorder, single episode, unspecified: Secondary | ICD-10-CM

## 2011-04-03 DIAGNOSIS — E739 Lactose intolerance, unspecified: Secondary | ICD-10-CM

## 2011-04-03 DIAGNOSIS — I10 Essential (primary) hypertension: Secondary | ICD-10-CM

## 2011-04-03 DIAGNOSIS — E039 Hypothyroidism, unspecified: Secondary | ICD-10-CM

## 2011-04-03 DIAGNOSIS — M7710 Lateral epicondylitis, unspecified elbow: Secondary | ICD-10-CM

## 2011-04-03 DIAGNOSIS — Z6825 Body mass index (BMI) 25.0-25.9, adult: Secondary | ICD-10-CM

## 2011-04-03 LAB — COMPREHENSIVE METABOLIC PANEL
AST: 17 U/L (ref 0–37)
Albumin: 4.3 g/dL (ref 3.5–5.2)
Alkaline Phosphatase: 89 U/L (ref 39–117)
BUN: 13 mg/dL (ref 6–23)
Potassium: 4.1 mEq/L (ref 3.5–5.3)
Sodium: 138 mEq/L (ref 135–145)

## 2011-04-03 LAB — POCT GLYCOSYLATED HEMOGLOBIN (HGB A1C): Hemoglobin A1C: 5.9

## 2011-04-03 MED ORDER — LABETALOL HCL 300 MG PO TABS: 300.0000 mg | ORAL_TABLET | Freq: Two times a day (BID) | ORAL | Status: DC

## 2011-04-03 MED ORDER — HYDROCHLOROTHIAZIDE 25 MG PO TABS: 25.0000 mg | ORAL_TABLET | ORAL | Status: DC

## 2011-04-03 MED ORDER — NITROGLYCERIN 0.2 MG/HR TD PT24: MEDICATED_PATCH | TRANSDERMAL | Status: DC

## 2011-04-03 NOTE — Progress Notes (Signed)
  Subjective:    Patient ID: [name], female    DOB: Sep 27, 1970, 40 y.o.   MRN: [id number]  HPIShe is very happy to finally have a child. It was grueling to visit [name] daily in the NICU for 3 months. She's only been home for 8 days but is doing well per her pediatrician. She won't latch, but [name] is feeding her breast mild from pumping.   Has only been taking one of the [name] instead of 2 twice daily due to dizziness when standing.   She has had left elbow pain for the past 1 month. Hurts to straighten it.   She's had bruising of her right upper arm without known trauma.   LMP begain June 28th and lasted 15 days. Had a tubal ligation after her delivery    Review of Systems  Respiratory: Negative for cough, chest tightness, shortness of breath and wheezing.   Cardiovascular: Negative for chest pain.  Neurological: Positive for dizziness. Negative for weakness and headaches.  Psychiatric/Behavioral: Negative for dysphoric mood.       Objective:   Physical Exam  Neck: No thyromegaly present.  Cardiovascular: Normal rate and regular rhythm.   Pulmonary/Chest: Effort normal and breath sounds normal. She has no wheezes.  Musculoskeletal: She exhibits no edema.  Psychiatric:       Appears tired, but not depressed          Assessment & Plan:

## 2011-04-03 NOTE — Assessment & Plan Note (Signed)
Recurrent without apparent cause. Will try ntg. She has a forearm band to wear.

## 2011-04-03 NOTE — Assessment & Plan Note (Signed)
Gestational diabetes but normal A1c now

## 2011-04-03 NOTE — Assessment & Plan Note (Signed)
Weight improved.

## 2011-04-03 NOTE — Patient Instructions (Signed)
Return in 3 weeks for blood pressure check  Regrese en 3 semanas para chequeo de presion arterial.

## 2011-04-03 NOTE — Assessment & Plan Note (Signed)
Poor control. Will add back HCTZ

## 2011-04-03 NOTE — Assessment & Plan Note (Signed)
Improved off medication.

## 2011-04-04 ENCOUNTER — Encounter: Payer: Self-pay | Admitting: Family Medicine

## 2011-04-04 LAB — CBC
MCHC: 29.7 g/dL — ABNORMAL LOW (ref 30.0–36.0)
RDW: 14.8 % (ref 11.5–15.5)

## 2011-04-05 ENCOUNTER — Encounter: Payer: Self-pay | Admitting: Family Medicine

## 2011-04-06 ENCOUNTER — Telehealth: Payer: Self-pay | Admitting: *Deleted

## 2011-04-06 NOTE — Telephone Encounter (Signed)
Patient left message on Physicians / Pharmacy Line and Dr. Mauricio Po interpreted. Patient was returning a call from Dr. Sheffield Slider. She can be reached at [phone].

## 2011-04-10 ENCOUNTER — Other Ambulatory Visit: Payer: Self-pay | Admitting: Family Medicine

## 2011-04-10 DIAGNOSIS — E039 Hypothyroidism, unspecified: Secondary | ICD-10-CM

## 2011-04-10 MED ORDER — LEVOTHYROXINE SODIUM 50 MCG PO TABS: 50.0000 ug | ORAL_TABLET | Freq: Every day | ORAL | Status: DC

## 2011-04-10 MED ORDER — LEVOTHYROXINE SODIUM 200 MCG PO TABS: 200.0000 ug | ORAL_TABLET | Freq: Every day | ORAL | Status: DC

## 2011-04-10 NOTE — Assessment & Plan Note (Signed)
Her TSH is very high. I will add 50 mcg daily to total 250 mcg daily as she was on with lower TSH levels last year.

## 2011-04-11 NOTE — Telephone Encounter (Signed)
Dr. Sheffield Slider has  left message on voicemail.

## 2011-05-23 ENCOUNTER — Inpatient Hospital Stay (INDEPENDENT_AMBULATORY_CARE_PROVIDER_SITE_OTHER)
Admission: RE | Admit: 2011-05-23 | Discharge: 2011-05-23 | Disposition: A | Discharge status: Home / Self Care | Payer: 59 | Source: Ambulatory Visit | Attending: Family Medicine | Admitting: Family Medicine

## 2011-05-23 DIAGNOSIS — M799 Soft tissue disorder, unspecified: Secondary | ICD-10-CM

## 2011-05-31 ENCOUNTER — Inpatient Hospital Stay (INDEPENDENT_AMBULATORY_CARE_PROVIDER_SITE_OTHER)
Admission: RE | Admit: 2011-05-31 | Discharge: 2011-05-31 | Disposition: A | Discharge status: Home / Self Care | Payer: 59 | Source: Ambulatory Visit | Attending: Emergency Medicine | Admitting: Emergency Medicine

## 2011-05-31 ENCOUNTER — Ambulatory Visit (INDEPENDENT_AMBULATORY_CARE_PROVIDER_SITE_OTHER): Payer: 59

## 2011-05-31 DIAGNOSIS — M25519 Pain in unspecified shoulder: Secondary | ICD-10-CM

## 2011-05-31 DIAGNOSIS — S6000XA Contusion of unspecified finger without damage to nail, initial encounter: Secondary | ICD-10-CM

## 2011-06-07 LAB — POCT URINALYSIS DIP (DEVICE)
Glucose, UA: NEGATIVE mg/dL
Specific Gravity, Urine: >=1.03 (ref 1.005–1.030)
Urobilinogen, UA: 0.2 mg/dL (ref 0.0–1.0)

## 2011-06-08 LAB — POCT URINALYSIS DIP (DEVICE)
Bilirubin Urine: NEGATIVE
Glucose, UA: NEGATIVE mg/dL
Glucose, UA: NEGATIVE mg/dL
Nitrite: NEGATIVE
Nitrite: NEGATIVE
Urobilinogen, UA: 0.2 mg/dL (ref 0.0–1.0)
Urobilinogen, UA: 0.2 mg/dL (ref 0.0–1.0)

## 2011-06-08 LAB — CBC
HCT: 43.3 % (ref 36.0–46.0)
Hemoglobin: 14.4 g/dL (ref 12.0–15.0)
MCHC: 33.3 g/dL (ref 30.0–36.0)
MCV: 88.4 fL (ref 78.0–100.0)
RBC: 4.9 MIL/uL (ref 3.87–5.11)

## 2011-06-08 LAB — WET PREP, GENITAL
Clue Cells Wet Prep HPF POC: NONE SEEN
Trich, Wet Prep: NONE SEEN
Yeast Wet Prep HPF POC: NONE SEEN

## 2011-06-08 LAB — HCG, QUANTITATIVE, PREGNANCY: hCG, Beta Chain, Quant, S: 1902 m[IU]/mL — ABNORMAL HIGH (ref <5)

## 2011-06-11 LAB — URINALYSIS, ROUTINE W REFLEX MICROSCOPIC
Bilirubin Urine: NEGATIVE
Glucose, UA: NEGATIVE
Hgb urine dipstick: NEGATIVE
Protein, ur: NEGATIVE
Urobilinogen, UA: 0.2

## 2011-06-11 LAB — CBC
HCT: 40.3
Hemoglobin: 12.2
Platelets: 188
RBC: 4.04
RBC: 4.57
WBC: 10.9 — ABNORMAL HIGH

## 2011-06-11 LAB — WET PREP, GENITAL
Trich, Wet Prep: NONE SEEN
Yeast Wet Prep HPF POC: NONE SEEN

## 2011-06-11 LAB — CREATININE, SERUM
Creatinine, Ser: 0.64
GFR calc Af Amer: >60
GFR calc non Af Amer: >60

## 2011-06-11 LAB — TISSUE CULTURE

## 2011-06-11 LAB — PROTIME-INR
INR: 0.9
Prothrombin Time: 12.3

## 2011-06-11 LAB — FIBRINOGEN: Fibrinogen: 683 — ABNORMAL HIGH

## 2011-06-11 LAB — APTT: aPTT: 27

## 2011-06-11 LAB — DIFFERENTIAL
Lymphocytes Relative: 16
Lymphs Abs: 1.7
Monocytes Relative: 7
Neutrophils Relative %: 76

## 2011-06-12 ENCOUNTER — Ambulatory Visit: Payer: 59 | Admitting: Family Medicine

## 2011-06-12 LAB — POCT URINALYSIS DIP (DEVICE)
Glucose, UA: NEGATIVE
Hgb urine dipstick: NEGATIVE
Ketones, ur: NEGATIVE
Ketones, ur: NEGATIVE
Ketones, ur: NEGATIVE
Nitrite: NEGATIVE
Protein, ur: NEGATIVE
Protein, ur: NEGATIVE
Specific Gravity, Urine: >=1.03
Urobilinogen, UA: 0.2
Urobilinogen, UA: 0.2
pH: 6.5
pH: 6.5

## 2011-06-12 LAB — GC/CHLAMYDIA PROBE AMP, GENITAL
Chlamydia, DNA Probe: NEGATIVE
GC Probe Amp, Genital: NEGATIVE

## 2011-06-12 LAB — WET PREP, GENITAL
Clue Cells Wet Prep HPF POC: NONE SEEN
Trich, Wet Prep: NONE SEEN

## 2011-06-13 LAB — URINALYSIS, ROUTINE W REFLEX MICROSCOPIC
Bilirubin Urine: NEGATIVE
Glucose, UA: NEGATIVE
Ketones, ur: NEGATIVE
pH: 6

## 2011-06-13 LAB — URINE MICROSCOPIC-ADD ON

## 2011-06-13 LAB — POCT URINALYSIS DIP (DEVICE)
Ketones, ur: NEGATIVE
Protein, ur: NEGATIVE
Specific Gravity, Urine: 1.025
pH: 7

## 2011-06-14 LAB — WET PREP, GENITAL

## 2011-06-14 LAB — POCT PREGNANCY, URINE
Operator id: 114931
Preg Test, Ur: POSITIVE

## 2011-06-14 LAB — URINALYSIS, ROUTINE W REFLEX MICROSCOPIC
Nitrite: NEGATIVE
Specific Gravity, Urine: 1.02
Urobilinogen, UA: 0.2

## 2011-06-14 LAB — HCG, QUANTITATIVE, PREGNANCY: hCG, Beta Chain, Quant, S: 3243 — ABNORMAL HIGH

## 2011-06-14 LAB — ABO/RH: ABO/RH(D): B POS

## 2011-06-19 LAB — BASIC METABOLIC PANEL
BUN: 19
GFR calc non Af Amer: >60
Potassium: 3.1 — ABNORMAL LOW
Sodium: 134 — ABNORMAL LOW

## 2011-06-19 LAB — DIFFERENTIAL
Basophils Absolute: 0
Basophils Relative: 0
Eosinophils Absolute: 0.2
Monocytes Absolute: 0.3
Monocytes Relative: 3
Neutro Abs: 6.8

## 2011-06-19 LAB — COMPREHENSIVE METABOLIC PANEL
Albumin: 3.3 — ABNORMAL LOW
BUN: 19
Calcium: 8.8
Glucose, Bld: 105 — ABNORMAL HIGH
Total Protein: 6.7

## 2011-06-19 LAB — CBC
HCT: 39.3
Platelets: 161
WBC: 10

## 2011-06-19 LAB — TSH: TSH: 72.755 — ABNORMAL HIGH

## 2011-06-19 LAB — HOMOCYSTEINE: Homocysteine: 6.3

## 2011-06-19 LAB — LIPID PANEL
Cholesterol: 252 — ABNORMAL HIGH
LDL Cholesterol: 157 — ABNORMAL HIGH
Triglycerides: 326 — ABNORMAL HIGH

## 2011-09-04 HISTORY — PX: LAPAROSCOPIC LYSIS INTESTINAL ADHESIONS: SUR778

## 2011-11-09 ENCOUNTER — Encounter: Payer: Self-pay | Admitting: Gynecology

## 2011-11-09 ENCOUNTER — Ambulatory Visit (INDEPENDENT_AMBULATORY_CARE_PROVIDER_SITE_OTHER): Payer: 59 | Admitting: Gynecology

## 2011-11-09 VITALS — BP 142/92 | Ht 62.25 in | Wt 165.0 lb

## 2011-11-09 DIAGNOSIS — N949 Unspecified condition associated with female genital organs and menstrual cycle: Secondary | ICD-10-CM

## 2011-11-09 DIAGNOSIS — R102 Pelvic and perineal pain: Secondary | ICD-10-CM

## 2011-11-09 DIAGNOSIS — N938 Other specified abnormal uterine and vaginal bleeding: Secondary | ICD-10-CM

## 2011-11-09 DIAGNOSIS — N926 Irregular menstruation, unspecified: Secondary | ICD-10-CM

## 2011-11-09 DIAGNOSIS — N941 Unspecified dyspareunia: Secondary | ICD-10-CM

## 2011-11-09 DIAGNOSIS — IMO0002 Reserved for concepts with insufficient information to code with codable children: Secondary | ICD-10-CM

## 2011-11-09 NOTE — Progress Notes (Signed)
Addended by: Bertram Savin A on: 11/09/2011 11:17 AM   Modules accepted: Orders

## 2011-11-09 NOTE — Patient Instructions (Signed)
Informacin sobre la histerectoma  (Hysterectomy Information)  Neomia Dear histerectoma es un procedimiento en el que se extirpa quirrgicamente el tero. Ya no tendr perodos menstruales ni quedar embarazada. Tambin durante esta ciruga podrn extirparle las trompas y los ovarios (salpingo-ooforectoma bilateral).  RAZONES PARA REALIZAR UNA HISTERECTOMA   Sangrado persistente, anormal.   Infeccin o dolor plvico de larga duracin (crnico).   El revestimiento del tero (endometrio) comienza a desarrollarse fuera del tero (endometriosis).   El endometrio comienza a desarrollarse en el msculo del tero (adenomiosis).   El tero desciende hacia la vagina (prolapso de los rganos plvicos).   Fibromas uterinos sintomticos.   Clulas precancerosas.   Cncer cervical o cncer uterino.  TIPOS DE HISTERECTOMA   Histerectoma supracervical. Este tipo de ciruga elimina la parte superior del tero, pero no el cuello del tero.   Histerectoma total. Se extirpa el tero y el cuello uterino.   Histerectoma radical. Se extrae el tero, el cuello uterino y el tejido fibroso que sostiene el tero en su lugar en la pelvis (parametrio).  FORMAS EN QUE SE PUEDE REALIZAR UNA HISTERECTOMA   Histerectoma abdominal. Se realiza gran corte quirrgico (incisin) en el abdomen. Se extirpa el tero a travs de esta incisin.   Histerectoma vaginal. Se hace una incisin en la vagina. Se extirpa el tero a travs de esta incisin. No hay incisiones abdominales.   Histerectoma laparoscpica convencional. Se inserta un tubo delgado que emite luz y que posee una cmara (laparoscopio) en 3 o 4 incisiones pequeas en el abdomen. El tero se corta en trozos pequeos. Las piezas pequeas se eliminan a travs de las incisiones, o se retiran a travs de la vagina.   Histerectoma laparoscpica vaginal asistida. Se hacen tres o cuatro incisiones pequeas en el abdomen. Parte de la ciruga se realiza por va  laparoscpica y parte por va vaginal. El tero se extirpa a travs de la vagina.   Histerectoma laparoscpica asistida por robot. Se inserta un laparoscopio en 3 o 4 incisiones pequeas en el abdomen. Se utiliza un dispositivo controlado por computadora para que el cirujano vea una imagen en 3D. Esto permite movimientos ms precisos de los instrumentos quirrgicos. El tero se corta en trozos pequeos y se retira a travs de las incisiones o se elimina a travs de la vagina.  RIESGOS DE LA HISTERECTOMA   Hemorragia y riesgos de necesitar una transfusin sangunea. Informe a su mdico si no quiere recibir productos de Risk manager.   Cogulos de [employer] piernas o los pulmones.   Infecciones.   Lesin en los rganos circundantes.   Problemas o efectos secundarios por la anestesia.   Conversin a una histerectoma abdominal.  QU ESPERAR DESPUES DE UNA HISTERECTOMA   Le administrarn medicamentos para calmar el dolor.   Necesitar que alguien permanezca con usted durante los primeros 3 a 5 das despus de regresar a Higher education careers adviser.   Tendr que hacer un control con su cirujano en 2 a 4 semanas despus de la ciruga para evaluar su progreso.   Podr tener sntomas tempranos de menopausia, como sofocos, sudores nocturnos e insomnio.   Si le han realizado una histerectoma por un problema que no era cncer u otra enfermedad que podra causar cncer, ya no necesitar un Papanicolaou. Sin embargo, si ya no necesita hacerse un Papanicolau, es una buena idea hacerse un examen regularmente para asegurarse de que no hay otros problemas.  Document Released: 08/20/2005 Document Revised: 08/09/2011 [facility] Patient Information 2012 [place], [place].  Ecografa transvaginal (Transvaginal Ultrasound) La ecografa transvaginal es una ecografa plvica en la que se utiliza una probeta metlica que se coloca en la vagina, para observar los rganos femeninos. El ecgrafo enva ondas sonoras desde un  transductor (sonda). Estas ondas sonoras chocan contra las estructuras del cuerpo (como un eco) y crean Naval architect. La imagen se observa en un monitor. Se denomina transvaginal debido a que la sonda se inserta dentro de la vagina. Puede haber una pequea molestia por la introduccin de la sonda. Esta prueba tambin puede realizarse [employer]. La ecografa endovaginal es otro nombre para la ecografa transvaginal. En una ecografa transabdominal, la sonda se coloca en la parte externa del abdomen. Este mtodo no ofrece imgenes tan buenas como la tcnica transvaginal. La ecogafa transvaginal se utiliza para observar alteraciones en el tracto genital femenino. Entre ellos se incluyen:  Problemas de infertilidad.   Malformaciones congnitas (defecto de nacimiento) del tero y los ovarios.   Tumores en el tero.   Hemorragias anormales.   Tumores y quistes de ovario.   Abscesos (tejidos inflamados y pus) en la pelvis.   Dolor abdominal o plvico sin causa aparente.   Infecciones plvicas.  DURANTE EL EMBARAZO, SE UTILIZA PAR OBSERVAR:  Embarazos normales.   Un embarazo ectpico (embarazo fuera del tero).   Latidos cardacos fetales.   Anormalidades de la pelvis que no se observan bien con la ecografa transabdominal.   Sospecha de gemelos o embarazo mltiple.   Aborto inminente.   Problemas en el cuello del tero (cuello incompetente, no permanece cerrado para contener al beb).   Cuando se realiza una amniocentesis (se retira lquido de la bolsa [place], para ser [place]).   Al buscar anormalidades en el beb.   Para controlar el crecimiento, el desarrollo y la edad del feto.   Para medir la cantidad de lquido en el saco amnitico.   Cuando se realiza una versin externa del beb (se lo mueve a Loss adjuster, chartered).   Evaluar al beb en embarazos de alto riesgo (perfil biofsico).   Si se sospecha el deceso del beb (muerte).  En algunos casos, se  utiliza un mtodo especial denominado ecografa con infusin salina, para una observacin ms precisa del tero. Se inyecta solucin salina (agua con sal) dentro del tero en pacientes no embarazadas para observar mejor su interior. Este mtodo no se Glass blower/designer. La probeta tambin puede usarse para obtener biopsias de Insurance claims handler, para drenar lquido de quistes de ovario y para Editor, commissioning un DIU (dispositivo intrauterino para el control de la natalidad) que no pueda [place]. PREPARACIN PARA LA PRUEBA La ecografa transvaginal se realiza con la vejiga vaca. La ecografa transabdominal se realiza con la vejiga llena. Podrn solicitarle que beba varios vasos de agua antes del examen. En algunos casos se realiza una ecografa transabdominal antes de la ecografa transvaginal para obervar los rganos del abdomen. PROCEDIMIENTO  Deber acostarse en una cama, con las rodillas dobladas y los pies en los estribos. La probeta se cubre con un condn. Dentro de la vagina y en la probeta se aplica un lubricante estril. El lubricante ayuda a transmitir las ondas sonoras y Nature conservation officer la irritacin de la vagina. El mdico mover la sonda en el interior de la cavidad vaginal para escanear las estructuras plvicas. Un examen normal mostrar una pelvis normal y contenidos normales en su interior. Una prueba anormal mostrar anormalidades en la pelvis, la placenta o el beb. LAS CAUSAS DE UN RESULTADO ANORMAL PUEDEN  SER:  Crecimientos o tumores en:   El tero.   Los ovarios.   La vagina.   Otras estructuras plvicas.   Crecimientos no cancerosos en el tero y los ovarios.   El ovario se retuerce y se corta el suministro de [place]   Las reas de infeccin incluyen:   Enfermedad inflamatoria plvica.   Un absceso en la pelvis.   Ubicacin de un DIU.  LOS PROBLEMAS QUE PUEDEN HALLARSE EN UNA MUJER EMBARAZADA SON:  Embarazo ectpico (embarazo fuera del tero).    Embarazos mltiples.   Dilatacin (apertura) precoz anormal del cuello del tero. Esto puede indicar un cuello incompetente y Surveyor, mining.   Aborto inminente.   Muerte fetal.   Los problemas con la placenta incluyen:   La placenta se ha desarrollado sobre la abertura del cuello del tero (placenta previa).   La placenta se ha separado anticipadamente en el tero (abrupcin placentaria).   La placenta se desarrolla en el msculo del tero (placenta acreta).   Tumores del Psychiatrist, incluyendo la enfermedad trofoblstica gestacional. Se trata de un embarazo anormal en el que no hay feto. El tero se llena de quistes similares a uvas que en algunos casos son cancerosos.   Posicin incorrecta del feto (de nalgas, de vrtice).   Retraso del desarrollo fetal intrauterino (escaso desarrollo en el tero).   Anormalidades o infeccin fetal.  RIESGOS Y COMPLICACIONES No hay riesgos conocidos para la ecografa. No se toman radiografas cuando se realiza una ecografa. Document Released: 12/06/2008 Document Revised: 08/09/2011 [facility] Patient Information 2012 [place], [place].

## 2011-11-09 NOTE — Progress Notes (Signed)
Patient is a 41 year old gravida 5 para 1 AB 4 who delivered via cesarean section 10 months ago and had tubal sterilization procedure. Patient stated that prior to her pregnancy that she was skipping periods whereby she would go to 3 months without a menstrual cycle. She is being followed by Dr. Sheffield Slider who is monitor her hypertension and her hypothyroidism. She presented to the office today with complaint of chronic pelvic pain, dyspareunia, and dysfunctional uterine bleeding (she would bleed 2-3 times per month). She denied any GU or GI complaints. Patient had a large midline incision when questioned she said that she had her record appendix in [place] several years ago. She is also has a history of cholecystectomy as well. 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 She denied any prior laparoscopy procedure done.  Exam: Abdomen: Midline scar from umbilicus to suprapubic region. Also Pfannenstiel scar. As well as scars from laparoscopic cholecystectomy. Pelvic: Bartholin urethra Skene was within normal limits Vagina: No gross lesions on inspection Cervix: No gross lesions on inspection Uterus and adnexa: Difficult to assess due to patient's discomfort cannot access adequately. Rectal: Not done  Assessment/plan: Patient 10 months status post cesarean section bilateral tubal sterilization procedure complaining of persistent dysfunctional uterine bleeding. Patient complaining also of chronic pelvic pain and dyspareunia. Patient was counseled and underwent a sterile endometrial biopsy in the office and tissue submitted for histological evaluation. We will check a CBC today and she'll return back next week for sonohysterogram. Literature information was provided on hysterectomy in the event that we need to pursue this route she continues to have this worsening chronic pelvic pain and irregular bleeding unless endometrial polyps or submucous myomas noted on sonohysterogram next  week. If we do need to proceed with hysterectomy because her abdominal incisions a history of ruptured appendix I would recommend we proceed with an open laparotomy technique to do her hysterectomy with ovarian conservation. All the above was explained to the patient Spanish and we'll follow accordingly.

## 2011-11-10 LAB — CBC WITH DIFFERENTIAL/PLATELET
Eosinophils Relative: 3 % (ref 0–5)
HCT: 33.9 % — ABNORMAL LOW (ref 36.0–46.0)
Hemoglobin: 9.9 g/dL — ABNORMAL LOW (ref 12.0–15.0)
Lymphocytes Relative: 27 % (ref 12–46)
Lymphs Abs: 2.5 10*3/uL (ref 0.7–4.0)
MCV: 75.8 fL — ABNORMAL LOW (ref 78.0–100.0)
Monocytes Absolute: 0.7 10*3/uL (ref 0.1–1.0)
Monocytes Relative: 8 % (ref 3–12)
Neutro Abs: 5.8 10*3/uL (ref 1.7–7.7)
RBC: 4.47 MIL/uL (ref 3.87–5.11)
RDW: 16.6 % — ABNORMAL HIGH (ref 11.5–15.5)
WBC: 9.3 10*3/uL (ref 4.0–10.5)

## 2011-11-12 ENCOUNTER — Other Ambulatory Visit: Payer: Self-pay | Admitting: Gynecology

## 2011-11-12 ENCOUNTER — Ambulatory Visit: Payer: 59

## 2011-11-12 ENCOUNTER — Ambulatory Visit (INDEPENDENT_AMBULATORY_CARE_PROVIDER_SITE_OTHER): Payer: 59 | Admitting: Gynecology

## 2011-11-12 DIAGNOSIS — N83209 Unspecified ovarian cyst, unspecified side: Secondary | ICD-10-CM

## 2011-11-12 DIAGNOSIS — D649 Anemia, unspecified: Secondary | ICD-10-CM

## 2011-11-12 DIAGNOSIS — N938 Other specified abnormal uterine and vaginal bleeding: Secondary | ICD-10-CM

## 2011-11-12 DIAGNOSIS — R102 Pelvic and perineal pain: Secondary | ICD-10-CM

## 2011-11-12 DIAGNOSIS — R109 Unspecified abdominal pain: Secondary | ICD-10-CM

## 2011-11-12 DIAGNOSIS — N949 Unspecified condition associated with female genital organs and menstrual cycle: Secondary | ICD-10-CM

## 2011-11-12 DIAGNOSIS — N941 Unspecified dyspareunia: Secondary | ICD-10-CM

## 2011-11-12 DIAGNOSIS — D3910 Neoplasm of uncertain behavior of unspecified ovary: Secondary | ICD-10-CM

## 2011-11-12 MED ORDER — MEDROXYPROGESTERONE ACETATE 150 MG/ML IM SUSP: 150.0000 mg | INTRAMUSCULAR | Status: DC

## 2011-11-12 MED ORDER — MEDROXYPROGESTERONE ACETATE 150 MG/ML IM SUSP
150.0000 mg | Freq: Once | INTRAMUSCULAR | Status: AC
  Administered 2011-11-12: 150 mg via INTRAMUSCULAR

## 2011-11-12 NOTE — Progress Notes (Signed)
Patient presented to the office today for sonohysterogram as part of her evaluation for her dysfunctional uterine bleeding. Patient was seen in the office on March 8 see previous encounter note. Patient with prior tubal sterilization procedure.  Endometrial biopsy done on March 8 result pending at time of this dictation Sonohysterogram today with the following results:  Uterus measured 10.8 x 6.1 x 6.3 cm endometrial stripe 13.3 mm (patient started menses today) left fundal myoma measuring 23 x 20 mm was noted.) Was normal. Left ovary had a thin-walled cyst which measured 36 x 36 x 33 mm with a reticular like to pattern negative color flow within the mass. There was marked no fluid in the cul-de-sac and right adnexa. Sonohysterogram no intracavitary defect  Patient will be started on Depo-Provera 150 mg IM today and return back to the office in 3 months for followup ultrasound to see if there's been resolution of this ovarian cyst and also to help with her dysfunctional uterine bleeding. Will await the results of endometrial biopsy which is pending at time of this dictation and make any changes accordingly. We may continue on the Depo-Provera 150 mg IM every 3 months perhaps for a year. She has had a previous tubal sterilization procedure. If she does continue to bleed we discussed about proceeding with definitive treatment which would be a hysterectomy. Literature information on the above was given to the patient in Spanish and all questions were answered and we'll follow accordingly. Of note she was found to have iron deficiency anemia whereby her hemoglobin was 9.9 hematocrit 33.9. She will be placed on iron supplementation 1 tablet twice a day.

## 2011-11-12 NOTE — Patient Instructions (Signed)
Quiste ovrico (Ovarian Cyst) Los ovarios son pequeos rganos que se encuentran a cada lado del tero. Los ovarios son los rganos que producen las hormonas femeninas, estrgeno y progesterona. Un quiste en el ovario es una bolsa llena de lquido que puede variar en tamao. Es normal que se formen pequeos quistes en las mujeres en edad de procrear y que an tienen sus perodos menstruales. Este tipo de quiste se denomina quiste folicular que se transforma en un quiste ovulatorio (quiste del cuerpo lteo) despus de producir los vulos. Si la mujer no queda embarazada, desaparece sin ninguna intervencin. Existen otros tipos de quistes de ovario que pueden causar problemas y necesitan ser tratados. El problema ms grave es que el quiste sea canceroso. Debe advertirse que en las mujeres menopusicas que presentan un quiste de ovario, existe un mayor riesgo de que ese quiste sea canceroso. Deben evaluarse muy rpida y cuidadosamente, y controlarse adecuadamente. Esto es ms importante en las mujeres menopusicas debido al elevado porcentaje de cncer de ovario durante este perodo. CAUSAS Y TIPOS DE CNCER DE OVARIO:  QUISTE FUNCIONAL: El quiste de folculo o cuerpo lteo es un quiste funcional que aparece todos los meses durante la ovulacin, con el ciclo menstrual. Si la mujer no queda embarazada, desaparecen con el prximo ciclo menstrual. Generalmente los quistes funcionales no presentan sntomas.   ENDOMETRIOMA: este quiste aparece en la superficie del tejido del tero. Un quiste se forma en el interior o sobre el ovario. Cada mes se desarrolla un poco ms debido a la sangre del perodo menstrual. Tambin se denomina "quiste de chocolate" debido a que est lleno de sangre que se vuelve color marrn. Este tipo de quiste causa dolor en la zona inferior del abdomen durante las relaciones sexuales y durante el perodo menstrual.   CISTADENOMA: Se desarrolla a partir de las clulas externas del ovario.  Generalmente no son cancerosos. Pueden llegar a ser de gran tamao y causar dolor en la zona baja del abdomen y durante las relaciones sexuales. Este tipo de quiste puede retorcerse e interrumpir el flujo de sangre, lo que causa un dolor muy intenso. Tambin puede romperse y causar mucho dolor.   QUISTE DERMOIDE: generalmente este tipo de quiste aparece en ambos ovarios. Puede haber diferentes tipos de tejidos en el quiste. Por ejemplo tejidos de piel, dientes, pelos o cartlago. En general no dan sntomas, excepto que sean muy grandes. Los quistes dermoides rara vez son cancerosos.   OVARIO POLIQUSTICO: es una enfermedad rara relacionada con trastornos hormonales que produce muchos quistes pequeos en ambos ovarios. Estos quistes son similares a los quistes de folculo pero nunca producen vulos y se transforman en cuerpo lteo. Pueden causar aumento del peso corporal, infertilidad, acn, aumento del vello facial y corporal y falta de perodos menstruales o perodos anormales. Muchas mujeres que sufren este problema presentan diabetes tipo 2. La causa exacta de este problema es desconocida. Un ovario poliqustico rara vez es canceroso.   QUISTE OVRICO TECALUTESTICO Aparece cuando hay demasiada hormona (gonadotrofina corinica humana), la que sobreestimula al ovario para producir vulos. Se observan con frecuencia cuando el mdico estimula los ovarios para la fertilizacin in vitro (bebs de probeta).   QUISTE LUTENICO: Aparece durante el embarazo. En algunos casos raros, produce una obstruccin del canal de parto. Generalmente desaparece despus del parto.  SNTOMAS  Dolor o molestias en la pelvis.   Dolor durante las relaciones sexuales.   Aumento de la inflamacin en el abdomen.   Perodos menstruales anormales.     Aumento del dolor en los perodos menstruales.   Deja de menstruar y no est embarazada.  DIAGNSTICO El diagnstico puede realizarse durante:  Los exmenes plvicos anuales  o de rutina (frecuente).   Ecografas   Radiografas de la pelvis.   Tomografa computada   Resonancia magntica..   Anlisis de sangre.  TRATAMIENTO  El tratamiento slo consiste en que el mdico controle el quiste mensualmente, durante 2  3 meses. Muchos desaparecen espontnemente, especialmente los quistes funcionales.   Puede aspirarse (secarse) con una aguja larga observndolo en una ecografa, o por laparoscopa (insertando un tubo en la pelvis a travs de una pequea incisin).   El quiste puede extirparse con laparoscopa.   En algunos casos es necesario extirparlo a travs de una incisin en la zona inferior del abdomen.   El tratamiento hormonal se utiliza para disolver ciertos tipos de quiste.   Las pldoras anticonceptivas pueden utilizarse para disolver otros tipos.  INSTRUCCIONES PARA EL CUIDADO DOMICILIARIO Siga las indicaciones del profesional con respecto a:  Medicamentos   Visitas de control para evaluar y tratar el quiste.   Puede ser necesario que tenga que volver o concertar una cita con otro profesional para descubrir la causa exacta del quiste, si su mdico no es gineclogo.   Realice un examen plvico y un Papanicolau todos los aos, segn las indicaciones.   Informe al mdico si tubo un quiste de ovario en el pasado.  SOLICITE ATENCIN MDICA SI:  Los perodos se atrasan, son irregulares, le faltan o son dolorosos.   El dolor abdominal (en el vientre) o en la pelvis persisten.   El abdomen se agranda o se hincha.   Siente una opresin en la vejiga o tiene problemas para vaciarla completamente.   Tiene dolor durante las relaciones sexuales.   Tiene la sensacin de hinchazn, presin o molestias en el abdomen.   Pierde peso sin razn aparente.   Siente un malestar generalizado.   Est constipada.   Pierde el apetito.   Aparece acn.   Aumenta el vello facial y corporal.   Aumenta de peso sin hacer modificaciones en su actividad  fsica y en su dieta habitual.   Sospecha que est embarazada.  SOLICITE ATENCIN MDICA DE INMEDIATO SI:  Siente dolor abdominal cada vez ms intenso.   Si tiene ganas de vomitar (nuseas).   Le sube repentinamente la fiebre.   Siente dolor abdominal al mover el intestino.   Sus perodos menstruales son ms abundantes que lo habitual.  Document Released: 05/30/2005 Document Revised: 08/09/2011 [employer] Patient Information 2012 [employer]. 

## 2011-11-12 NOTE — Progress Notes (Signed)
Addended by: Bertram Savin A on: 11/12/2011 10:41 AM   Modules accepted: Orders

## 2011-12-06 ENCOUNTER — Encounter: Payer: Self-pay | Admitting: Gynecology

## 2011-12-06 ENCOUNTER — Ambulatory Visit (INDEPENDENT_AMBULATORY_CARE_PROVIDER_SITE_OTHER): Payer: 59 | Admitting: Gynecology

## 2011-12-06 VITALS — BP 146/92

## 2011-12-06 DIAGNOSIS — N939 Abnormal uterine and vaginal bleeding, unspecified: Secondary | ICD-10-CM

## 2011-12-06 DIAGNOSIS — N938 Other specified abnormal uterine and vaginal bleeding: Secondary | ICD-10-CM

## 2011-12-06 DIAGNOSIS — N949 Unspecified condition associated with female genital organs and menstrual cycle: Secondary | ICD-10-CM

## 2011-12-06 DIAGNOSIS — R102 Pelvic and perineal pain: Secondary | ICD-10-CM

## 2011-12-06 DIAGNOSIS — N83209 Unspecified ovarian cyst, unspecified side: Secondary | ICD-10-CM

## 2011-12-06 DIAGNOSIS — D649 Anemia, unspecified: Secondary | ICD-10-CM

## 2011-12-06 MED ORDER — MEGESTROL ACETATE 40 MG PO TABS: 40.0000 mg | ORAL_TABLET | Freq: Two times a day (BID) | ORAL | Status: AC

## 2011-12-06 NOTE — Progress Notes (Signed)
Patient presented to the office today with continued vaginal bleeding. Patient had previously been seen the office on March 8 whereby she had an endometrial biopsy which was benign. On March 11 she had a sonohysterogram which demonstrated no intracavitary defect and she had a left ovarian thinwall cyst measuring 3.6 x 3.6 x 3.3 cm with reticular like pattern negative color flow within the mass. Right ovary was normal. Patient also was found to be anemic with a hemoglobin 9.9 and a normal platelet count. Patient was started on iron supplementation 1 tablet twice a day and she received Depo-Provera 150 mg IM to suppress the ovarian cyst and to control her bleeding.  Patient was reexamined today. Pelvic: Bartholin urethra Skene was within normal limits Vagina: Blood was present the vaginal vault Cervix: Blood present in the external cervical os Uterus anteverted upper limits of normal Adnexa: No palpable masses or tenderness Rectal: Not examined  Patient has informed me that several years ago she had a stroke and had been placed on blood thinners and is currently on baby aspirin. She stated she had to be anticoagulated as well during her pregnancy when she was in the hospital for premature cervical dilatation. She does have history hyperlipidemia, hypothyroidism and hypertension which she has been followed by Dr. Sheffield Slider. Patient has not seen him in the year.  In an effort to control her bleeding and get her iron stores up to normal ( aside from the Depo-Provera 150 mg that was administered a few days ago) we discussed about placing a Mirena IUD. The risks benefits and pros and cons were discussed and was placed in the following fashion:  After the cervix was cleansed with Betadine solution the anterior cervical lip was grasped with a single-tooth tenaculum the uterus sounded to 8 cm and the IUD was placed in sterile fashion. The single-tooth tenaculum was then removed.  Patient was given a prescription  for Megace 40 mg to take 1 by mouth twice a day for 10 days if her bleeding does not slow down after having placed a Mirena IUD. I've asked her also to followup with Dr. Sheffield Slider since she has not seen in the year. I will put in a call to him as well to get a clearer past medical history. She will otherwise followup in 3 months to recheck her CBC and a followup ultrasound on the ovarian cyst.

## 2011-12-06 NOTE — Progress Notes (Signed)
Dr. Glenetta Hew asked me to check benefits for Mirena IUD.  I spoke with Fannie Knee at [facility] and was told even under office services/sick & injury it will be just a $20 copymt.  Dr. Glenetta Hew informed. ka

## 2011-12-06 NOTE — Patient Instructions (Signed)
Si sigue sangrando de aqui al [employer] recoja la receta en la farmacia de Megace (se toma una dos veces al dia por 10 dias). Siga tomando el hierro [employer] al dia. Recuerdase de hacer la cita con el Dr. Sheffield Slider.

## 2011-12-07 LAB — CBC WITH DIFFERENTIAL/PLATELET
Basophils Relative: 2 % — ABNORMAL HIGH (ref 0–1)
Eosinophils Absolute: 0.2 10*3/uL (ref 0.0–0.7)
Hemoglobin: 9.8 g/dL — ABNORMAL LOW (ref 12.0–15.0)
Lymphs Abs: 2.3 10*3/uL (ref 0.7–4.0)
Monocytes Relative: 7 % (ref 3–12)
Neutro Abs: 4.4 10*3/uL (ref 1.7–7.7)
Neutrophils Relative %: 59 % (ref 43–77)
Platelets: 177 10*3/uL (ref 150–400)
RBC: 4.39 MIL/uL (ref 3.87–5.11)

## 2011-12-10 ENCOUNTER — Ambulatory Visit: Payer: 59 | Admitting: Gynecology

## 2011-12-10 ENCOUNTER — Other Ambulatory Visit: Payer: 59

## 2012-02-07 ENCOUNTER — Ambulatory Visit: Payer: 59 | Admitting: Gynecology

## 2012-03-20 ENCOUNTER — Encounter: Payer: Self-pay | Admitting: Gynecology

## 2012-03-20 ENCOUNTER — Ambulatory Visit (INDEPENDENT_AMBULATORY_CARE_PROVIDER_SITE_OTHER): Payer: 59 | Admitting: Gynecology

## 2012-03-20 VITALS — BP 130/90

## 2012-03-20 DIAGNOSIS — N949 Unspecified condition associated with female genital organs and menstrual cycle: Secondary | ICD-10-CM

## 2012-03-20 DIAGNOSIS — N938 Other specified abnormal uterine and vaginal bleeding: Secondary | ICD-10-CM

## 2012-03-20 DIAGNOSIS — IMO0002 Reserved for concepts with insufficient information to code with codable children: Secondary | ICD-10-CM

## 2012-03-20 DIAGNOSIS — D259 Leiomyoma of uterus, unspecified: Secondary | ICD-10-CM

## 2012-03-20 DIAGNOSIS — D649 Anemia, unspecified: Secondary | ICD-10-CM

## 2012-03-20 LAB — CBC WITH DIFFERENTIAL/PLATELET
Basophils Absolute: 0.1 10*3/uL (ref 0.0–0.1)
Eosinophils Relative: 1 % (ref 0–5)
HCT: 36.1 % (ref 36.0–46.0)
Lymphocytes Relative: 27 % (ref 12–46)
Lymphs Abs: 2.1 10*3/uL (ref 0.7–4.0)
MCV: 79.3 fL (ref 78.0–100.0)
Neutro Abs: 5 10*3/uL (ref 1.7–7.7)
Platelets: 145 10*3/uL — ABNORMAL LOW (ref 150–400)
RBC: 4.55 MIL/uL (ref 3.87–5.11)
RDW: 16.8 % — ABNORMAL HIGH (ref 11.5–15.5)
WBC: 7.7 10*3/uL (ref 4.0–10.5)

## 2012-03-20 NOTE — Patient Instructions (Addendum)
Histerectoma total laparoscpica  (Total Laparoscopic Hysterectomy) La histerectoma laparoscpica total es una ciruga minimamente invasiva para extirpar el tero y el cuello. Esta ciruga se realiza haciendo pequeos cortes (incisiones) en el abdomen. Tambin se puede hacer con un tubo delgado que ilumina (laparoscopio) que se inserta a travs de dos pequeas incisiones en el abdomen inferior. Tambin durante esta ciruga podrn extirparse las trompas de [place] y los ovarios (salpingo-ooforectoma bilateral).Si se inicia una histerectoma laparoscpica total y no es Nurse, adult, la [place] laparoscpica se convertir a una ciruga abdominal abierta.  Usted no va a tener perodos menstruales ni podr quedar embarazada despus de [place]. Si se hace una salpingo-ooforectoma antes de la [place], se iniciar una menopausia sbita (abrupta). En este caso le recetarn hormonas.  Los beneficios de la ciruga mnimamente invasiva son:   Psychologist, educational.   Menor riesgo de prdidas sanguneas.   Menor riesgo de sufrir infecciones.   Regreso ms rpido a las [employer].   En general, la Chemical engineer durante 1 noche en el hospital.   Satisfaccin completa de la paciente.  INFORME A SU MDICO SOBRE:   Su historia de papanicolau anormales.   Alergias a alimentos o medicamentos.   Medicamentos que [place], incluyendo vitaminas, hierbas, gotas oftlmicas, medicamentos de venta libre y cremas.   Uso de corticoides (por va oral o cremas).   Problemas anteriores debido a anestsicos o a medicamentos que [employer] sensibilidad.   Antecedentes de hemorragias o cogulos sanguneos.   Cirugas anteriores.   Otros problemas de salud, incluyendo diabetes y problemas renales.   Deseo de fertilidad en el futuro.   Infecciones o resfros que haya sufrido.   Sntomas de la menstruacin irregular o abundante, prdida de [place] o cambios urinarios o intestinales.    RIESGOS Y COMPLICACIONES   Hemorragias.   Cogulos de [employer] piernas o los pulmones.   Infecciones.   Lesin en los rganos circundantes.   Problemas con la anestesia.   Sntomas precoces de menopausia (sofocos, sudores nocturnos, insomnio).   Riesgo de conversin a una incisin abdominal abierta.  ANTES DEL PROCEDIMIENTO   Consulte a su mdico si debe cambiar o suspender los medicamentos que toma habitualmente.   No tome aspirina ni anticoagulantes durante la semana previa al procedimiento, o segn le hayan indicado.   No coma ni beba nada durante las 8 horas previas al procedimiento, o segn le hayan indicado.   Si fuma, deje de hacerlo.   Solicite que la lleven a su casa luego de la ciruga y que alguien la ayude con las tareas de la casa durante el tiempo de la recuperacin.  PROCEDIMIENTO   Le recetarn antibiticos.   Le colocarn una va de acceso intravenoso (IV) en el brazo. Le administrarn un medicamento que la har dormir (anestesia general).   Le insuflarn un gas (dixido de carbono) en el abdomen. Esto le permitir al cirujano observar el interior del abdomen, Education officer, environmental la ciruga y si es necesario, el tratamiento de otros problemas encontrados.   Le harn tres o cuatro pequeas incisiones (generalmente de menos de  pulgada) en el abdomen. Una de estas incisiones se harn en la zona del (ombligo). El laparoscopio se inserta en la incisin. El cirujano observa a travs del laparoscopio mientras realiza el procedimiento.   Otros instrumentos quirrgicos se insertan a travs de las otras incisiones.   El tero se extirpar a travs de la vagina o de se seccionar en trozos pequeos y se Oceanographer  por las pequeas incisiones.   Cerrarn las incisiones.  DESPUS DEL PROCEDIMIENTO   Se liberar el gas del interior del abdomen.   Despus de la ciruga lo llevarn al rea de recuperacin donde un(a) enfermero(a) lo cuidar y Chief Operating Officer su evolucin. Una vez  que despierte, se encuentre estabilizada y pueda ingerir lquidos, excepto que ocurra un imprevisto, podr volver a Hotel manager.   Suele haber pocas molestias despus de la ciruga debido a que las incisiones son muy pequeas.   Se le dar medicamento para el dolor mientras se encuentre en el hospital y para cuando vaya a su casa.   Trate de tener a alguien con [employer] primeros 3 a 5 das despus de volver a su casa.   Concurra para un control con el cirujano de 2 a 4 semanas despus de la ciruga para evaluar su progreso.  Document Released: 08/09/2011 [facility] Patient Information 2012 [place], [place].

## 2012-03-20 NOTE — Progress Notes (Signed)
Patient presented to the office today with continued vaginal bleeding. Patient had previously been seen the office on March 8 whereby she had an endometrial biopsy which was benign. On March 11 she had a sonohysterogram which demonstrated no intracavitary defect and she had a left ovarian thinwall cyst measuring 3.6 x 3.6 x 3.3 cm with reticular like pattern negative color flow within the mass. Right ovary was normal. Patient also was found to be anemic with a hemoglobin 9.9 and a normal platelet count. Patient was started on iron supplementation 1 tablet twice a day and she received Depo-Provera 150 mg IM to suppress the ovarian cyst and to control her bleeding. A Mirena IUD was placed on April 4. She states she's bleeding 3-4 days of the month. And is having dyspareunia.   Patient had informed me that several years ago she had a stroke and had been placed on blood thinners and is currently on baby aspirin.  She stated she had to be anticoagulated as well during her pregnancy when she was in the hospital for premature cervical dilatation. She does have history hyperlipidemia, hypothyroidism and hypertension which she has been followed by Dr. Alwyn Ren.  Exam: Abdomen: Soft nontender no rebound or guarding Pelvic: Bartholin urethra Skene was within normal limits Vagina: Brownish bloody discharge was noted Cervix: No active bleeding was noted, IUD string seen Uterus: Anteverted irregular shaped approximately 10-12 weeks size difficult to assess adnexa Adnexa: As per above Rectal: Not examined  Assessment/plan patient with iron deficiency anemia as a result of her menorrhagia. The Mirena IUD has helped cut down the amount of bleeding although she still bleeds 3/4 weeks per month. She's also complaining of dyspareunia. She is continue to take her iron supplementation twice a day. She has had a tubal sterilization procedure at time of her last delivery. Patient interested in proceeding with definitive  management. We had discussed endometrial ablation versus total laparoscopic hysterectomy. She will return back to the office next week for an ultrasound to followup on the size of the uterus as well as on the ovarian cyst. Literature information was provided on both of the above-mentioned subject. If we proceed with a total laparoscopic hysterectomy because of her past history of stroke she will need to receive anticoagulation pre-and postoperatively. We will need medical medical clearance from her primary physician. She may be still wearing off the effects the Depo-Provera. All the above was discussed with the patient Spanish in detail all questions rancher will follow accordingly.

## 2012-03-21 ENCOUNTER — Ambulatory Visit: Payer: 59 | Admitting: Gynecology

## 2012-03-21 ENCOUNTER — Other Ambulatory Visit: Payer: 59

## 2012-03-22 ENCOUNTER — Encounter (HOSPITAL_COMMUNITY): Payer: Self-pay

## 2012-03-22 ENCOUNTER — Inpatient Hospital Stay (HOSPITAL_COMMUNITY)
Admission: AD | Admit: 2012-03-22 | Discharge: 2012-03-23 | Disposition: A | Discharge status: Home / Self Care | Payer: 59 | Source: Ambulatory Visit | Attending: Obstetrics & Gynecology | Admitting: Obstetrics & Gynecology

## 2012-03-22 DIAGNOSIS — N949 Unspecified condition associated with female genital organs and menstrual cycle: Secondary | ICD-10-CM | POA: Insufficient documentation

## 2012-03-22 DIAGNOSIS — I10 Essential (primary) hypertension: Secondary | ICD-10-CM | POA: Insufficient documentation

## 2012-03-22 DIAGNOSIS — N938 Other specified abnormal uterine and vaginal bleeding: Secondary | ICD-10-CM | POA: Insufficient documentation

## 2012-03-22 HISTORY — DX: Essential (primary) hypertension: I10

## 2012-03-22 HISTORY — DX: Anemia, unspecified: D64.9

## 2012-03-22 NOTE — MAU Note (Signed)
Had a baby alittle more than a year ago. Has had abnormal vag bleeding since delivery. Today bleeding has been worse. Having some dizziness, pain in feet. Has been anemic. Has scheduled u/s for next Friday. Has IUD in since April to see if would help bleeding. Saw doctor on Thurs. And told may need hysterectomy.

## 2012-03-23 DIAGNOSIS — N949 Unspecified condition associated with female genital organs and menstrual cycle: Secondary | ICD-10-CM

## 2012-03-23 MED ORDER — MEGESTROL ACETATE 40 MG PO TABS: 40.0000 mg | ORAL_TABLET | Freq: Every day | ORAL | Status: AC

## 2012-03-23 NOTE — MAU Provider Note (Signed)
History     CSN: [id number]  Arrival date and time: 03/22/12 2307   None     Chief Complaint  Patient presents with  . Vaginal Bleeding   HPI This is a 41 yo hispanic female who presents to the MAU with a long standing history of dysfunctional uterine bleeding that started about 1 year ago.  She had a Mirena IUD placed in April 2013 with mild improvement.  She passed several large clots today, which prompted her to present to the emergency department. She denies abdominal pain, chest pain, shortness of breath, diaphoresis, fevers, chills, headache, blurred vision, unilateral weakness.  OB History    Grav Para Term Preterm Abortions TAB SAB Ect Mult Living   5 1  1 3  3   1       Past Medical History  Diagnosis Date  . Hyperthyroidism 10/2000    RAI treatment,  multinodular goiter  . Ectopic pregnancy 04/2006    treated with medication  . Hepatic steatosis 07/2006    on Abd CT done for pain  . 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 Preterm delivery 12/2010    placental abruption, maternal htn, DM  . Hypertension   . Anemia     Past Surgical History  Procedure Date  . Appendectomy 1997  . Cholecystectomy 1997  . Ovarian cyst removal   . Laparoscopic lysis intestinal adhesions   . Tubal ligation     Family History  Problem Relation Age of Onset  . Hypertension Mother   . Hypertension Father   . Stroke Father     hemorrhagic    History  Substance Use Topics  . Smoking status: Never Smoker   . Smokeless tobacco: Never Used  . Alcohol Use: No    Allergies: No Known Allergies  Prescriptions prior to admission  Medication Sig Dispense Refill  . aspirin (ASPIR-LOW) 81 MG EC tablet Take 81 mg by mouth daily.       Marland Kitchen diltiazem (CARDIZEM) 90 MG tablet Take 90 mg by mouth 4 (four) times daily.      . ferrous gluconate (FERGON) 325 MG tablet Take 325 mg by mouth daily with breakfast.      . levothyroxine (SYNTHROID, LEVOTHROID) 75  MCG tablet Take 100 mcg by mouth daily.       Marland Kitchen lisinopril (PRINIVIL,ZESTRIL) 20 MG tablet Take 20 mg by mouth daily.      Marland Kitchen lisinopril-hydrochlorothiazide (PRINZIDE,ZESTORETIC) 20-25 MG per tablet Take 1 tablet by mouth daily.      . prenatal vitamin w/FE, FA (PRENATAL 1 + 1) 27-1 MG TABS Take 1 tablet by mouth daily.      . ferrous sulfate 325 (65 FE) MG tablet Take 325 mg by mouth daily with breakfast.      . hydrochlorothiazide 25 MG tablet Take 1 tablet (25 mg total) by mouth every morning.  30 tablet  11  . labetalol (TRANDATE) 300 MG tablet Take 1 tablet (300 mg total) by mouth 2 (two) times daily.  60 tablet  11  . levothyroxine (SYNTHROID) 200 MCG tablet Take 1 tablet (200 mcg total) by mouth daily.      Marland Kitchen levothyroxine (SYNTHROID) 50 MCG tablet Take 1 tablet (50 mcg total) by mouth daily. Take with the Synthroid 200 mg to total 250 mg daily  30 tablet  11  . medroxyPROGESTERone (DEPO-PROVERA) 150 MG/ML injection Inject 1 mL (150 mg total) into the muscle every 3 (three)  months.  1 mL  2  . nitroGLYCERIN (NITRODUR - DOSED IN MG/24 HR) 0.2 mg/hr Apply 1/4 patch to tender area left elbow daily for 6 weeks  14 patch  0  . DISCONTD: Acetaminophen 500 MG coapsule Take 2 capsules by mouth 3 (three) times daily as needed.       Marland Kitchen DISCONTD: albuterol (PROAIR HFA) 108 (90 BASE) MCG/ACT inhaler Inhale 2 puffs into the lungs every 4 (four) hours as needed.       Marland Kitchen DISCONTD: atenolol (TENORMIN) 100 MG tablet Take 100 mg by mouth daily.      Marland Kitchen DISCONTD: diclofenac sodium (VOLTAREN) 1 % GEL Apply to affected area three to four times a day, large OP      . DISCONTD: guaiFENesin-codeine (GUAIATUSSIN AC) 100-10 MG/5ML syrup Take 1-2 tsp every 4 hr for cough        Review of Systems  All other systems reviewed and are negative.   Physical Exam   Blood pressure 185/107, pulse 95, temperature 98.5 F (36.9 C), temperature source Oral, resp. rate 20, height 5\' 2"  (1.575 m), weight 74.39 kg (164 lb),  SpO2 99.00%.  Physical Exam  Constitutional: She is oriented to person, place, and time. She appears well-developed.  Eyes: Pupils are equal, round, and reactive to light.  Neck: Normal range of motion.  Cardiovascular: Normal rate, regular rhythm and normal heart sounds.   Respiratory: Effort normal. No respiratory distress. She has no wheezes. She has no rales. She exhibits no tenderness.  GI: Soft. She exhibits no distension and no mass. There is tenderness (Mild pelvic tenderness). There is no rebound and no guarding.  Neurological: She is alert and oriented to person, place, and time. No cranial nerve deficit.  Skin: Skin is warm and dry. No rash noted. No erythema. No pallor.  Psychiatric: She has a normal mood and affect. Her behavior is normal. Judgment and thought content normal.   Results for orders placed in visit on 03/20/12 (from the past 72 hour(s))  CBC WITH DIFFERENTIAL     Status: Abnormal   Collection Time   03/20/12  4:04 PM      Component Value Range Comment   WBC 7.7  4.0 - 10.5 K/uL    RBC 4.55  3.87 - 5.11 MIL/uL    Hemoglobin 11.5 (*) 12.0 - 15.0 g/dL    HCT 54.0  98.1 - 19.1 %    MCV 79.3  78.0 - 100.0 fL    MCH 25.3 (*) 26.0 - 34.0 pg    MCHC 31.9  30.0 - 36.0 g/dL    RDW 47.8 (*) 29.5 - 15.5 %    Platelets 145 (*) 150 - 400 K/uL    Neutrophils Relative 65  43 - 77 %    Neutro Abs 5.0  1.7 - 7.7 K/uL    Lymphocytes Relative 27  12 - 46 %    Lymphs Abs 2.1  0.7 - 4.0 K/uL    Monocytes Relative 6  3 - 12 %    Monocytes Absolute 0.5  0.1 - 1.0 K/uL    Eosinophils Relative 1  0 - 5 %    Eosinophils Absolute 0.1  0.0 - 0.7 K/uL    Basophils Relative 1  0 - 1 %    Basophils Absolute 0.1  0.0 - 0.1 K/uL    Smear Review Criteria for review not met      MAU Course  Procedures  MDM Symptomatic dysfunctional uterine bleeding. No  evidence of anemia. Severe range of pressures. Called and spoke with Dr. Rosalia Hammers at [facility] emergency department, who stated that  unless patient had symptoms, that they would not give any medications to reduce the patient's blood pressure.  Assessment and Plan  1.  dysfunctional uterine bleeding Spoke with Dr. Hyacinth Meeker - will prescribe Megace 40 mg daily. Patient is to followup with Dr. Lily Peer on Friday.  #2 severe hypertension Discussed warning signs of stroke the patient. Patient is to followup with primary Dr. in 2-3 days.  Patient discharged home  [name] 03/23/2012, 12:23 AM

## 2012-03-23 NOTE — Progress Notes (Signed)
Dr Adrian Blackwater notified of patient complaints, history and vital signs

## 2012-03-24 ENCOUNTER — Encounter: Payer: Self-pay | Admitting: Gynecology

## 2012-03-24 ENCOUNTER — Telehealth: Payer: Self-pay | Admitting: Gynecology

## 2012-03-24 ENCOUNTER — Ambulatory Visit (INDEPENDENT_AMBULATORY_CARE_PROVIDER_SITE_OTHER): Payer: 59 | Admitting: Gynecology

## 2012-03-24 VITALS — BP 138/94

## 2012-03-24 DIAGNOSIS — Z30432 Encounter for removal of intrauterine contraceptive device: Secondary | ICD-10-CM

## 2012-03-24 DIAGNOSIS — N920 Excessive and frequent menstruation with regular cycle: Secondary | ICD-10-CM

## 2012-03-24 NOTE — Telephone Encounter (Signed)
Okay thank you

## 2012-03-24 NOTE — Telephone Encounter (Signed)
Debarah Crape called to talk with patient regarding scheduling her hysterectomy.  I asked Debarah Crape to inform her regarding her insurance benefits and what her GGA surgery pre-payment [phone])  will be and also if there were any dates I needed to work around when scheduling. Debarah Crape did inform her and patient said she wanted to talk with her husband regarding finances and she will talk with Debarah Crape on Friday when she comes for u/s regarding this and scheduling.

## 2012-03-24 NOTE — Progress Notes (Signed)
Patient presented to the office today with continued vaginal bleeding. Patient reports having been seen in the emergency room this week and due to the heaviness of bleeding with passage of large clots and cramping. Patient had previously been seen the office on March 8 whereby she had an endometrial biopsy which was benign. On March 11 she had a sonohysterogram which demonstrated no intracavitary defect and she had a left ovarian thinwall cyst measuring 3.6 x 3.6 x 3.3 cm with reticular like pattern negative color flow within the mass. Right ovary was normal. Patient also was found to be anemic with a hemoglobin 9.9 and a normal platelet count. Patient was started on iron supplementation 1 tablet twice a day and she received Depo-Provera 150 mg IM to suppress the ovarian cyst and to control her bleeding. A Mirena IUD was placed on April 4. Patient was seen in the office on July 18 with the following assessment and plan:   Patient with iron deficiency anemia as a result of her menorrhagia. The Mirena IUD has helped cut down the amount of bleeding although she still bleeds 3/4 weeks per month. She's also complaining of dyspareunia. She is continue to take her iron supplementation twice a day. She has had a tubal sterilization procedure at time of her last delivery. Patient interested in proceeding with definitive management. We had discussed endometrial ablation versus total laparoscopic hysterectomy. She will return back to the office next week for an ultrasound to followup on the size of the uterus as well as on the ovarian cyst. Literature information was provided on both of the above-mentioned subject. If we proceed with a total laparoscopic hysterectomy because of her past history of stroke she will need to receive anticoagulation pre-and postoperatively. We will need medical medical clearance from her primary physician. She may be still wearing off the effects the Depo-Provera   Patient had informed me  that several years ago she had a stroke and had been placed on blood thinners and is currently on baby aspirin. She stated she had to be anticoagulated as well during her pregnancy when she was in the hospital for premature cervical dilatation. She does have history hyperlipidemia, hypothyroidism and hypertension which she has been followed by Dr. Alwyn Ren.   Exam: Abdomen: Soft nontender no rebound or guarding Pelvic: Bartholin urethra Skene was within normal limits Vagina: Menstrual blood was present IUD was half way out of the external cervical canal. The IUD was grasped with a ring forcep removed shown to the patient and discarded Uterus: 10 weeks size anteverted slightly irregular at the fundus Adnexa: No palpable masses or tenderness Rectal: Not examined  Assessment/plan patient had been seen in the emergency room this week  because of the heavy bleeding and passage of large clots. It appears that her symptoms have worsened because of partial extrusion of the IUD. Patient scheduled to return to the office at the end of the week for followup ultrasound on her ovarian cyst. Literature information and previous been provided on the endometrial ablation (HerOption technique) as well as total laparoscopic hysterectomy. Patient is interested in provided with definitive surgery such as with hysterectomy. We'll begin to make arrangements and discuss further which she returns for her ultrasound on Friday. She was encouraged to increase her Megace that was prescribed an emergency room from once a day to twice a day and to continue to take her iron twice a day. Her hemoglobin was 9.8 on April of 2013 and since the Mirena IUD was  placed and she was started R. supplementation her hemoglobin on July 18 was 11.5.

## 2012-03-28 ENCOUNTER — Other Ambulatory Visit: Payer: 59

## 2012-03-28 ENCOUNTER — Ambulatory Visit: Payer: 59 | Admitting: Gynecology

## 2012-03-31 ENCOUNTER — Telehealth: Payer: Self-pay | Admitting: Gynecology

## 2012-03-31 ENCOUNTER — Ambulatory Visit (INDEPENDENT_AMBULATORY_CARE_PROVIDER_SITE_OTHER): Payer: 59

## 2012-03-31 ENCOUNTER — Other Ambulatory Visit (HOSPITAL_COMMUNITY)
Admission: RE | Admit: 2012-03-31 | Discharge: 2012-03-31 | Disposition: A | Discharge status: Home / Self Care | Payer: 59 | Source: Ambulatory Visit | Attending: Gynecology | Admitting: Gynecology

## 2012-03-31 ENCOUNTER — Ambulatory Visit (INDEPENDENT_AMBULATORY_CARE_PROVIDER_SITE_OTHER): Payer: 59 | Admitting: Gynecology

## 2012-03-31 ENCOUNTER — Encounter: Payer: Self-pay | Admitting: Family Medicine

## 2012-03-31 ENCOUNTER — Ambulatory Visit (INDEPENDENT_AMBULATORY_CARE_PROVIDER_SITE_OTHER): Payer: 59 | Admitting: Family Medicine

## 2012-03-31 ENCOUNTER — Encounter: Payer: Self-pay | Admitting: Gynecology

## 2012-03-31 VITALS — BP 130/92

## 2012-03-31 VITALS — BP 156/90 | HR 96 | Ht 62.0 in | Wt 163.0 lb

## 2012-03-31 DIAGNOSIS — Z1151 Encounter for screening for human papillomavirus (HPV): Secondary | ICD-10-CM | POA: Insufficient documentation

## 2012-03-31 DIAGNOSIS — N938 Other specified abnormal uterine and vaginal bleeding: Secondary | ICD-10-CM

## 2012-03-31 DIAGNOSIS — IMO0002 Reserved for concepts with insufficient information to code with codable children: Secondary | ICD-10-CM

## 2012-03-31 DIAGNOSIS — I10 Essential (primary) hypertension: Secondary | ICD-10-CM

## 2012-03-31 DIAGNOSIS — Z01419 Encounter for gynecological examination (general) (routine) without abnormal findings: Secondary | ICD-10-CM | POA: Insufficient documentation

## 2012-03-31 DIAGNOSIS — I6350 Cerebral infarction due to unspecified occlusion or stenosis of unspecified cerebral artery: Secondary | ICD-10-CM

## 2012-03-31 DIAGNOSIS — D259 Leiomyoma of uterus, unspecified: Secondary | ICD-10-CM

## 2012-03-31 DIAGNOSIS — Z124 Encounter for screening for malignant neoplasm of cervix: Secondary | ICD-10-CM

## 2012-03-31 DIAGNOSIS — D649 Anemia, unspecified: Secondary | ICD-10-CM

## 2012-03-31 DIAGNOSIS — E039 Hypothyroidism, unspecified: Secondary | ICD-10-CM

## 2012-03-31 DIAGNOSIS — N920 Excessive and frequent menstruation with regular cycle: Secondary | ICD-10-CM

## 2012-03-31 DIAGNOSIS — Z01818 Encounter for other preprocedural examination: Secondary | ICD-10-CM

## 2012-03-31 DIAGNOSIS — N949 Unspecified condition associated with female genital organs and menstrual cycle: Secondary | ICD-10-CM

## 2012-03-31 LAB — CBC
MCV: 82.6 fL (ref 78.0–100.0)
Platelets: 165 10*3/uL (ref 150–400)
RBC: 4.3 MIL/uL (ref 3.87–5.11)
WBC: 7.2 10*3/uL (ref 4.0–10.5)

## 2012-03-31 LAB — COMPREHENSIVE METABOLIC PANEL
CO2: 23 mEq/L (ref 19–32)
Creat: 0.82 mg/dL (ref 0.50–1.10)
Glucose, Bld: 100 mg/dL — ABNORMAL HIGH (ref 70–99)
Sodium: 140 mEq/L (ref 135–145)
Total Bilirubin: 0.2 mg/dL — ABNORMAL LOW (ref 0.3–1.2)
Total Protein: 7.8 g/dL (ref 6.0–8.3)

## 2012-03-31 LAB — T4, FREE: Free T4: 0.69 ng/dL — ABNORMAL LOW (ref 0.80–1.80)

## 2012-03-31 MED ORDER — DILTIAZEM HCL 90 MG PO TABS: 90.0000 mg | ORAL_TABLET | Freq: Two times a day (BID) | ORAL | Status: DC

## 2012-03-31 MED ORDER — ENOXAPARIN SODIUM 40 MG/0.4ML ~~LOC~~ SOLN: 40.0000 mg | SUBCUTANEOUS | Status: DC

## 2012-03-31 NOTE — Assessment & Plan Note (Signed)
A: asymptomatic. Med compliant except only taking diltiazem once daily.  P: -check CMP, CBC, TSH -instructed patient to increase dilt to BID -f/u in 1 week with PCP for lab review and medical clearance exam.

## 2012-03-31 NOTE — Progress Notes (Signed)
Subjective:     Patient ID: [name], female   DOB: Sep 08, 1970, 41 y.o.   MRN: [id number]  HPI 41 yo F presents to discuss the following: need for surgical clearance for hysterectomy planned at the end of August, hypothyroidism and HTN.   1. Medical clearance: Request by Dr. Lily Peer for hysterectomy planned for  Augustv 22, 2013.  Due to the patient's history of stroke. Dr. Lily Peer plans to start the patient on Lovenox injections 12 hr prior to surgery to be continued daily for 6 weeks post op. The hysterectomy is for treatment resistant dysfunctional uterine bleeding. The patient continues to bleed despite IUD placement (fell out), depo and megace. She has been instructed by her gynecologist to stop aspirin.   2. HTN: reports taking medications (brought them in).  Med review: synthroid 100 mcg q AM lisinopril 20 mg-HCTZ 25 mg q AM.  lisinorpil 2She0 mg nightly diltiazem 90 mg nightly.  She denies HA, vision changes, CP, SOB and LE edema.   3. Hypothyroidism: compliant the synthroid. Does decreased 3 months ago when patient went to a walk in clinic. She denies weight changes, palpitation, GI upset or change in mood.   Review of Systems As per HPI     Objective:   Physical Exam BP 156/90  Pulse 96  Ht 5\' 2"  (1.575 m)  Wt 163 lb (73.936 kg)  BMI 29.81 kg/m2 General appearance: alert, cooperative and no distress Head: Normocephalic, without obvious abnormality, atraumatic Eyes: conjunctivae/corneas clear. PERRL, EOM's intact. Fundi unable to visualize on fundoscopic exam.  Neck: no adenopathy, no carotid bruit, no JVD, supple, symmetrical, trachea midline and thyroid not enlarged, symmetric, no tenderness/mass/nodules Lungs: clear to auscultation bilaterally Heart: regular rate and rhythm, S1, S2 normal, no murmur, click, rub or gallop Extremities: extremities normal, atraumatic, no cyanosis or edema Pulses: 2+ and symmetric Skin: Skin color, texture, turgor normal. No  rashes or lesions Neurologic: Grossly normal  Assessment and Plan:   41 yo F with multiple medical problems.  1. Medical clearance: defer until patient can f/u with her PCP. She will benefit from tighter BP control prior to surgery. She has normal renal function and no apparent functional impairment but this will have to be assessed fully.

## 2012-03-31 NOTE — Progress Notes (Signed)
[name] is an 41 y.o. female. Gravida 1 para 1 (cesarean section/BTL). Patient been followed in the office since March of this year as a result of her ongoing menorrhagia. She's been followed by Dr. Sheffield Slider for her chronic hypertension, hyperlipidemia, and hypothyroidism.. Patient with prior history of stroke many years ago requiring anticoagulations for 6 months. She had received also anticoagulation during her past pregnancy and postpartum last year. Patient with past history of laparoscopic cholecystectomy here in the [place]. 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  In March 2013 patient had a sonohysterogram and endometrial biopsy whereby a thin-walled ovarian cyst measuring 36 x 36 x 33 mm was noted and one small fundal fibroid measured 23 x 20 mm contralateral ovary was normal. She received a shot of Depo-Provera 150 mg in effort to suppress the cyst and to control her bleeding. Patient also was found to be anemic with a hemoglobin of 9.8 and was started on iron supplementation. Her endometrial biopsy was benign. On April 4 a Mirena IUD was placed in effort to control her menorrhagia and returned back on July 22 because of worsening bleeding with the IUD and it was removed. Patient also had been given Megace in the past to help control her bleeding. Patient presented to the office today for preoperative consultation and for an ultrasound followup of the ovarian cyst. Today's ultrasound demonstrated a uterus measuring 9.9 x 6.1 x 5.0 cm with endometrial stripe of 12.7 mm she had 3 small fibroids the last one measuring 23 x 17 mm ovaries appeared to be normal. Patient's recent hemoglobin one week ago was 11.5 g.  Pertinent Gynecological History: Menses: Menorrhagia lasting 7-12 days Bleeding: Same as above Contraception: tubal ligation DES exposure: denies Blood transfusions: none Sexually transmitted diseases: no past history Previous GYN  Procedures: C-section and tubal ligation  Last mammogram: normal Date: ? Last pap: normal Date: 2012 OB History: G 1, P 1   Menstrual History: Menarche age: 40 No LMP recorded.    Past Medical History  Diagnosis Date  . Hyperthyroidism 10/2000    RAI treatment,  multinodular goiter  . Ectopic pregnancy 04/2006    treated with medication  . Hepatic steatosis 07/2006    on Abd CT done for pain  . 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 Preterm delivery 12/2010    placental abruption, maternal htn, DM  . Hypertension   . Anemia     Past Surgical History  Procedure Date  . Appendectomy 1997  . Cholecystectomy 1997  . Ovarian cyst removal   . Laparoscopic lysis intestinal adhesions   . Tubal ligation     Family History  Problem Relation Age of Onset  . Hypertension Mother   . Hypertension Father   . Stroke Father     hemorrhagic    Social History:  reports that she has never smoked. She has never used smokeless tobacco. She reports that she does not drink alcohol or use illicit drugs.  Allergies: No Known Allergies   (Not in a hospital admission)  REVIEW OF SYSTEMS: A ROS was performed and pertinent positives and negatives are included in the history.  GENERAL: No fevers or chills. HEENT: No change in vision, no earache, sore throat or sinus congestion. NECK: No pain or stiffness. CARDIOVASCULAR: No chest pain or pressure. No palpitations. PULMONARY: No shortness of breath, cough or wheeze. GASTROINTESTINAL: No abdominal  pain, nausea, vomiting or diarrhea, melena or bright red blood per rectum. GENITOURINARY: No urinary frequency, urgency, hesitancy or dysuria. MUSCULOSKELETAL: No joint or muscle pain, no back pain, no recent trauma. DERMATOLOGIC: No rash, no itching, no lesions. ENDOCRINE: No polyuria, polydipsia, no heat or cold intolerance. No recent change in weight. HEMATOLOGICAL: Anemia. NEUROLOGIC: No headache, seizures, numbness,  tingling or weakness. PSYCHIATRIC: No depression, no loss of interest in normal activity or change in sleep pattern.     Blood pressure 130/92.  Physical Exam:  HEENT:unremarkable Neck:Supple, midline, no thyroid megaly, no carotid bruits Lungs:  Clear to auscultation no rhonchi's or wheezes Heart:Regular rate and rhythm, no murmurs or gallops Breast Exam: Symmetrical in appearance no palpable masses or tenderness no supraclavicular axillary lymphadenopathy Abdomen: Midline scar as well as Pfannenstiel scar as well as ports scars from laparoscopic cholecystectomy Pelvic:BUS within normal limits  Vagina: Dark brown menstrual blood Cervix: No lesions or discharge Uterus: Anteverted normal size shape and consistency Adnexa: No palpable masses or tenderness Extremities: No cords, no edema Rectal: Not done  No results found for this or any previous visit (from the past 24 hour(s)).  No results found.  Assessment/Plan: 41 year old gravida 1 para 1 with metromenorrhagia. Patient with prior Mirena IUD failure as well as Depo-Provera injection. Patient would like to proceed with definitive treatment. Patient has had ruptured appendix and has midline scar as well as a Pfannenstiel scar from previous cesarean section. We'll approach this procedure with a total laparoscopic hysterectomy and ovarian conservation. Patient was extensively counseled of the potential for possible open laparotomy in case of technical difficulty due to scar tissue from previous surgeries. And also her increased risk for intra-abdominal trauma or injury. She will continue her R. supplementation. She was instructed to refrain from any aspirin-containing products the week prior her surgery. Because of her past history of stroke many years ago she will be placed on Lovenox 40 mg subcutaneous starting 12 hours before her surgery and she will continue to do so daily for 6 weeks postop. In addition the following risk were  discussed:                        Patient was counseled as to the risk of surgery to include the following:  1. Infection (prohylactic antibiotics will be administered)  2. DVT/Pulmonary Embolism (prophylactic pneumo compression stockings will be used)  3.Trauma to internal organs requiring additional surgical procedure to repair any injury to     Internal organs requiring perhaps additional hospitalization days.  4.Hemmorhage requiring transfusion and blood products which carry risks such as             anaphylactic reaction, hepatitis and AIDS  Patient had received literature information on the procedure scheduled and all her questions were answered in her native tongue and accepts all risk.  Medical clearance from Dr. Sheffield Slider was requested patient will be seen him later today.  [facility] HMD12:39 PMTD@   Jennah Satchell H 03/31/2012, 12:23 PM

## 2012-03-31 NOTE — Addendum Note (Signed)
Addended by: Ok Edwards on: 03/31/2012 12:55 PM   Modules accepted: Orders

## 2012-03-31 NOTE — Progress Notes (Signed)
Interpreter Chaya Dehaan Namihira for Dr Funches  

## 2012-03-31 NOTE — Telephone Encounter (Signed)
[name] spoke with the patient (Spanish) today and patient was very grateful that [facility] would work with her on her surgery pre-payment. She will be able to pay half.  Her surgery was scheduled for August 22 and date was confirmed with patient. Financial letter mailed

## 2012-03-31 NOTE — Assessment & Plan Note (Signed)
A: history of stroke. Off aspirin due to bleeding.  P: recommend restarting ASA of Hgb stable and plat WNL.  Patient to f/u in 1 week to discuss planned hysterectomy.

## 2012-03-31 NOTE — Patient Instructions (Addendum)
Instrucciones Generales para Metallurgist (General Instructions for Surgery)   Estas son instrucciones generales. Cubren varios tipos de [place] y son pautas prequirrgicas habituales. No se aplican a todos los procedimientos. Usted puede tener algunas dudas. El profesional que lo [employer] responder. PREPARACIN PARA LA CIRUGA  Deje de fumar al [employer] antes de la [place]. Esto disminuye los [employer] operacin. Pdale ayuda al mdico, si es necesario. Los beneficios que trae esta actitud valen la pena. Es un buen momento para cambiar a un estilo de vida sano.   El profesional que lo asiste puede aconsejarle que suspenda el uso de ciertos medicamentos que pueden afectar el resultado de la [place] y su capacidad para curarse. Por ejemplo, puede que tenga que suspender el uso de antiinflamatorios como la aspirina o el ibuprofeno, ya que pueden generar problemas de hemorragia. Otros medicamentos pudieran interactuar con la anestesia.   ASEGRESE DE HACERLE SABER AL PROFESIONAL QUE LO ASISTE SI USTED HA ESTADO UTILIZANDO CORTICOIDES DURANTE LARGOS PERODOS. ESTO ES DE FUNDAMENTAL IMPORTANCIA.   El profesional que le asiste le explicar, antes de la [place], los posibles riesgos y complicaciones. Adems de los riesgos que se presentan en cualquier anestesia, otros riesgos y complicaciones frecuentes incluyen la prdida de [place] y su reposicin (esto no es aplicable a procedimientos quirrgicos menores), aumento temporario del Engineer, mining debido a la ciruga, dolores o problemas que no se solucionan, infecciones o nuevos daos.  ANTES DE LA CIRUGA Usted deber presentarse Agosto 22a las 6 am antes del procedimiento o cuando el profesional que lo asiste se lo indique. Regstrese en el departamento de admisiones para completar los formularios necesarios, si no lo ha hecho con anterioridad. All encontrar formularios de consentimiento que Secretary/administrator antes del procedimiento. Hay una sala  de espera para que permanezca su familia, mientras usted es sometido al procedimiento.  INFORME AL PROFESIONAL SOBRE LOS SIGUIENTES PUNTOS:  Alergias  Medicamentos que toma, incluidas hierbas, gotas oftlmicas, medicamentos de venta libre y cremas   Uso de esteroides (por va oral o cremas)   Problemas anteriores debido a anestsicos o a la novocana   Posibilidad de Psychiatrist, si correspondiera  Antecedentes de haber tenido cogulos sanguneos (tromboflebitis)   Antecedentes de hemorragias o problemas sanguneos   Cirugas previas   Otros problemas de salud   LUEGO DE LA CIRUGA Despus de la ciruga lo llevarn al rea de recuperacin donde una enfermera lo cuidar y Chief Operating Officer su evolucin. Una vez que despierte, se encuentre estabilizado y pueda ingerir lquidos, excepto que ocurra un imprevisto, usted podr volver a Hotel manager. Una vez que se encuentre en su hogar, aplique una bolsa de hielo envuelta en una toalla en la zona de la ciruga, lo que puede ayudarle a disminuir las molestias y Restaurant manager, fast food hinchazn.  INSTRUCCIONES PARA EL CUIDADO DOMICILIARIO  Siga las instrucciones del profesional que lo asiste relacionadas con las actividades, ejercicio, fisioterapia y la conduccin de automviles.   Puede ser de gran beneficio la reduccin del peso, segn el tipo de [place] de que se trate. Por ejemplo, una ciruga de la espalda.   Un ejercicio diario puede ser de [employer]. Mantenga su fuerza y amplitud de movimientos de la forma en que se le indique.   Utilice los medicamentos de venta libre o de prescripcin para Chief Technology Officer, Environmental health practitioner o la [place], segn se lo indique el profesional que lo asiste.  SOLICITE ATENCIN MDICA SI:  Aumenta el sangrado en Immunologist de la  herida (ms de [employer]).   Empapa ms de cuatro apsitos luego de un procedimiento en el tero, a menos que le indiquen otra cosa.   Observa enrojecimiento, hinchazn o Teacher, music en la herida.    Aparece pus en la herida.   La temperatura oral se eleva sin motivo por encima de 100 o segn le indique el profesional que lo asiste.   Un olor ftido proviene de la herida o del vendaje.   Se siente mareado o pierde el conocimiento (se desmaya).  SOLICITE ATENCIN MDICA DE INMEDIATO SI:  Aparece una erupcin cutnea.   Tiene dificultad para respirar.   Tiene algn problema alrgico.  Document Released: 05/30/2005 Document Re-Released: 11/16/2008 [facility] Patient Information 2011 [place], [place].   Administra la inyeccion de Lovenox a las 7:30 de la noche antes de ;la operaccion   [facility] HMD10:59 AMTD@

## 2012-03-31 NOTE — Assessment & Plan Note (Signed)
A: no symptoms of hypothyroidism on history or exam. Elevated BP ? over treatment.  P: -check TSH, free T4 and T3 adjust synthroid dose if needed.

## 2012-03-31 NOTE — Patient Instructions (Addendum)
[name],  Thank you for coming in today. For your BP please do the following: 1. Increase diltiazem to twice daily: am and nightly.  2. F/u with Dr. Sheffield Slider in 1 week for presurgical clearance, lab review and BP f/u.   Dr. Armen Pickup

## 2012-04-04 ENCOUNTER — Telehealth: Payer: Self-pay | Admitting: Family Medicine

## 2012-04-04 MED ORDER — LEVOTHYROXINE SODIUM 200 MCG PO TABS: 100.0000 ug | ORAL_TABLET | Freq: Every day | ORAL | Status: DC

## 2012-04-04 NOTE — Telephone Encounter (Signed)
I spoke with her husband by phone and informed him that I will be sending in her larger dose of levothyroxine. She has demonstrated repeatedly in the past that she requires this high dose to normalize her TSH.

## 2012-04-07 ENCOUNTER — Telehealth: Payer: Self-pay | Admitting: Family Medicine

## 2012-04-07 NOTE — Telephone Encounter (Signed)
Appt is sched 8/27 with Dr Howie Ill 1st available. Spoke with husband and gave info.

## 2012-04-11 ENCOUNTER — Telehealth: Payer: Self-pay | Admitting: Gynecology

## 2012-04-11 ENCOUNTER — Telehealth: Payer: Self-pay | Admitting: Family Medicine

## 2012-04-11 NOTE — Telephone Encounter (Signed)
I discussed [name] with Dr Lily Peer who plans hysterectomy in 3 weeks. I anticipate she will be stable for surgery by that time if she continues taking her full thyroid replacement and we can achieve good blood pressure control as assessed on the office visit that she has scheduled 04/14/12. I will send him an evaluation note and my thoughts on perioperative anticoagulation after that visit.

## 2012-04-11 NOTE — Telephone Encounter (Signed)
This morning I spoke with Dr. Zachery Dauer patient's primary physician in reference to her medical clearance prior to her scheduled surgery August 22. Patient has a history of hypothyroidism and appears that she has not been compliant with her medication and recently had an adjustment made. Also patient is being treated for her hypertension as well. Patient appears to may have had a mini stroke several years ago. I discussed with with Dr. Sheffield Slider about the possibility of putting her on prophylaxis anticoagulation such as Lovenox: 12 hours before surgery and then daily for 6-8 weeks postop. She scheduled to see him next week and he will get back with me as to her medical clearance although he sees no contraindication at the present time.

## 2012-04-11 NOTE — Telephone Encounter (Signed)
Error

## 2012-04-14 ENCOUNTER — Encounter: Payer: Self-pay | Admitting: Family Medicine

## 2012-04-14 ENCOUNTER — Ambulatory Visit (INDEPENDENT_AMBULATORY_CARE_PROVIDER_SITE_OTHER): Payer: 59 | Admitting: Family Medicine

## 2012-04-14 VITALS — BP 145/91 | HR 92 | Ht 62.0 in | Wt 161.2 lb

## 2012-04-14 DIAGNOSIS — J45909 Unspecified asthma, uncomplicated: Secondary | ICD-10-CM

## 2012-04-14 DIAGNOSIS — I10 Essential (primary) hypertension: Secondary | ICD-10-CM

## 2012-04-14 DIAGNOSIS — E039 Hypothyroidism, unspecified: Secondary | ICD-10-CM

## 2012-04-14 DIAGNOSIS — I6350 Cerebral infarction due to unspecified occlusion or stenosis of unspecified cerebral artery: Secondary | ICD-10-CM

## 2012-04-14 MED ORDER — DILTIAZEM HCL 90 MG PO TABS: 90.0000 mg | ORAL_TABLET | Freq: Three times a day (TID) | ORAL | Status: DC

## 2012-04-14 MED ORDER — LEVOTHYROXINE SODIUM 25 MCG PO TABS: 25.0000 ug | ORAL_TABLET | Freq: Every day | ORAL | Status: DC

## 2012-04-14 NOTE — Patient Instructions (Addendum)
Aumente la dosis de Levothroid hasta 225 micrograms (200 mcg plus 25 mcg) que fue la dosis de buen control de tiroides antes. Increase the dose of Levothroid up to 225 mcg by taking the 200 mcg plus a 25 mcg tablet each morning. This is the dose that controlled you thyroid well last year.  Aumente la dosis de Diltiazem hasta 90 mg tres veces al dia para controlar mejor la presion arterial.  Increase the dose of Diltiazem to 90 mg 3 times daily to improve blood pressure control.   Llameme la semana que viene con la presion arterial. Call me next week with your blood pressure reading.   Buena suerte con la cirugia para mejorarse muy pronto Good luck with your surgery that hopefully will make you feel better soon.

## 2012-04-14 NOTE — Progress Notes (Signed)
  Subjective:    Patient ID: [name], female    DOB: 1970/12/06, 41 y.o.   MRN: [id number]  HPI She has more energy and less leg weakness on the 200 mcg of Levothroid. No palpitations or chest pain.   She is taking the Diltiazem 90 mg twice daily without symptoms   She continues the heavy menses with clots and lower abdominal discomfort.  Review of Systems     Objective:   Physical Exam  Constitutional: She is oriented to person, place, and time.       Face is puffy. Abdominal obesity.   Cardiovascular: Normal rate and regular rhythm.   No murmur heard. Pulmonary/Chest: Effort normal and breath sounds normal.  Musculoskeletal: She exhibits no edema.  Neurological: She is alert and oriented to person, place, and time.  Psychiatric: She has a normal mood and affect. Her behavior is normal. Judgment and thought content normal.       Interacting normally with her 37 month old daughter.           Assessment & Plan:

## 2012-04-14 NOTE — Assessment & Plan Note (Signed)
No current or recent asthma symptoms

## 2012-04-14 NOTE — Assessment & Plan Note (Signed)
Since her stable dose before was 225 mcg of Levothroid will increase to that dose. There won't be time to repeat a TSH prior to surgery, but

## 2012-04-14 NOTE — Assessment & Plan Note (Signed)
Not yet adequately controlled. Will increase Diltiazem to 90 tid. Consider extended release form later

## 2012-04-14 NOTE — Assessment & Plan Note (Signed)
I reviewed her MRI. Since there was no bleeding, It would be reasonable to have her on Lovenox perioperatively.

## 2012-04-17 ENCOUNTER — Encounter (HOSPITAL_COMMUNITY)
Admission: RE | Admit: 2012-04-17 | Discharge: 2012-04-17 | Disposition: A | Discharge status: Home / Self Care | Payer: 59 | Source: Ambulatory Visit | Attending: Gynecology | Admitting: Gynecology

## 2012-04-17 ENCOUNTER — Encounter (HOSPITAL_COMMUNITY): Payer: Self-pay

## 2012-04-17 LAB — COMPREHENSIVE METABOLIC PANEL
ALT: 23 U/L (ref 0–35)
AST: 20 U/L (ref 0–37)
Albumin: 4.3 g/dL (ref 3.5–5.2)
Alkaline Phosphatase: 79 U/L (ref 39–117)
BUN: 16 mg/dL (ref 6–23)
Chloride: 102 mEq/L (ref 96–112)
Potassium: 3.7 mEq/L (ref 3.5–5.1)
Sodium: 140 mEq/L (ref 135–145)
Total Bilirubin: 0.3 mg/dL (ref 0.3–1.2)

## 2012-04-17 LAB — CBC
HCT: 35.3 % — ABNORMAL LOW (ref 36.0–46.0)
Platelets: 153 10*3/uL (ref 150–400)
RDW: 17 % — ABNORMAL HIGH (ref 11.5–15.5)
WBC: 7.2 10*3/uL (ref 4.0–10.5)

## 2012-04-17 NOTE — Pre-Procedure Instructions (Signed)
Eda Royal used in PAT appt for instructions with patient.

## 2012-04-17 NOTE — Patient Instructions (Addendum)
   Your procedure is scheduled [id number], 04/24/12  Enter through the Main Entrance of [facility] at: 6 am Pick up the phone at the desk and dial [phone] and inform us of your arrival.  Please call this number if you have any problems the morning of surgery: [phone]  Remember: Do not eat food after midnight: Wednesday Do not drink clear liquids after: Wednesday Take these medicines the morning of surgery with a SIP OF WATER:  Cardizem, levothyroxine, lisinopril.  Use lovenox 7pm night prior to surgery (Wed)   Do not wear jewelry, make-up, or FINGER nail polish No metal in your hair or on your body. Do not wear lotions, powders, perfumes or deodorant. Do not shave 48 hours prior to surgery. Do not bring valuables to the hospital. Contacts, dentures or bridgework may not be worn into surgery.  Leave suitcase in the car. After Surgery it may be brought to your room. For patients being admitted to the hospital, checkout time is 11:00am the day of discharge. Home with husband [name].  Remember to use your hibiclens as instructed.Please shower with 1/2 bottle the evening before your surgery and the other 1/2 bottle the morning of surgery. Neck down avoiding private area.

## 2012-04-22 ENCOUNTER — Telehealth: Payer: Self-pay | Admitting: Gynecology

## 2012-04-22 NOTE — Telephone Encounter (Signed)
Thank you :)

## 2012-04-22 NOTE — Telephone Encounter (Signed)
Dr. Glenetta Hew asked me to call patient to remind her to pick up her Lovenox from pharmacy and give her self injection tomorrow evening at 7pm.  Debarah Crape called patient for me since patient is Spanish speaking. She already has her medication and will administer tomorrow evening as directed.

## 2012-04-23 MED ORDER — CEFAZOLIN SODIUM-DEXTROSE 2-3 GM-% IV SOLR
2.0000 g | INTRAVENOUS | Status: AC
  Administered 2012-04-24: 2 g via INTRAVENOUS

## 2012-04-24 ENCOUNTER — Encounter (HOSPITAL_COMMUNITY): Payer: Self-pay | Admitting: *Deleted

## 2012-04-24 ENCOUNTER — Ambulatory Visit (HOSPITAL_COMMUNITY): Payer: 59 | Admitting: Anesthesiology

## 2012-04-24 ENCOUNTER — Encounter (HOSPITAL_COMMUNITY): Payer: Self-pay | Admitting: Anesthesiology

## 2012-04-24 ENCOUNTER — Encounter (HOSPITAL_COMMUNITY)
Admission: RE | Disposition: A | Discharge status: Home / Self Care | Payer: Self-pay | Source: Ambulatory Visit | Attending: Gynecology

## 2012-04-24 ENCOUNTER — Inpatient Hospital Stay (HOSPITAL_COMMUNITY)
Admission: RE | Admit: 2012-04-24 | Discharge: 2012-04-26 | DRG: 743 | Disposition: A | Discharge status: Home / Self Care | Payer: 59 | Source: Ambulatory Visit | Attending: Gynecology | Admitting: Gynecology

## 2012-04-24 DIAGNOSIS — N920 Excessive and frequent menstruation with regular cycle: Principal | ICD-10-CM | POA: Diagnosis present

## 2012-04-24 DIAGNOSIS — N9489 Other specified conditions associated with female genital organs and menstrual cycle: Secondary | ICD-10-CM | POA: Diagnosis present

## 2012-04-24 DIAGNOSIS — N949 Unspecified condition associated with female genital organs and menstrual cycle: Secondary | ICD-10-CM | POA: Diagnosis present

## 2012-04-24 DIAGNOSIS — D259 Leiomyoma of uterus, unspecified: Secondary | ICD-10-CM

## 2012-04-24 DIAGNOSIS — E039 Hypothyroidism, unspecified: Secondary | ICD-10-CM | POA: Diagnosis present

## 2012-04-24 DIAGNOSIS — I10 Essential (primary) hypertension: Secondary | ICD-10-CM

## 2012-04-24 DIAGNOSIS — J45909 Unspecified asthma, uncomplicated: Secondary | ICD-10-CM

## 2012-04-24 DIAGNOSIS — E663 Overweight: Secondary | ICD-10-CM

## 2012-04-24 DIAGNOSIS — G43909 Migraine, unspecified, not intractable, without status migrainosus: Secondary | ICD-10-CM

## 2012-04-24 DIAGNOSIS — D251 Intramural leiomyoma of uterus: Secondary | ICD-10-CM | POA: Diagnosis present

## 2012-04-24 DIAGNOSIS — Z5331 Laparoscopic surgical procedure converted to open procedure: Secondary | ICD-10-CM

## 2012-04-24 DIAGNOSIS — N926 Irregular menstruation, unspecified: Secondary | ICD-10-CM

## 2012-04-24 DIAGNOSIS — D649 Anemia, unspecified: Secondary | ICD-10-CM

## 2012-04-24 DIAGNOSIS — I6350 Cerebral infarction due to unspecified occlusion or stenosis of unspecified cerebral artery: Secondary | ICD-10-CM

## 2012-04-24 DIAGNOSIS — N938 Other specified abnormal uterine and vaginal bleeding: Secondary | ICD-10-CM | POA: Diagnosis present

## 2012-04-24 DIAGNOSIS — R209 Unspecified disturbances of skin sensation: Secondary | ICD-10-CM

## 2012-04-24 DIAGNOSIS — N736 Female pelvic peritoneal adhesions (postinfective): Secondary | ICD-10-CM | POA: Diagnosis present

## 2012-04-24 DIAGNOSIS — E785 Hyperlipidemia, unspecified: Secondary | ICD-10-CM

## 2012-04-24 DIAGNOSIS — E079 Disorder of thyroid, unspecified: Secondary | ICD-10-CM | POA: Diagnosis present

## 2012-04-24 DIAGNOSIS — K429 Umbilical hernia without obstruction or gangrene: Secondary | ICD-10-CM

## 2012-04-24 HISTORY — PX: ABDOMINAL HYSTERECTOMY: SHX81

## 2012-04-24 HISTORY — PX: LAPAROSCOPIC HYSTERECTOMY: SHX1926

## 2012-04-24 LAB — PREGNANCY, URINE: Preg Test, Ur: NEGATIVE

## 2012-04-24 SURGERY — HYSTERECTOMY, TOTAL, LAPAROSCOPIC
Anesthesia: General | Site: Abdomen | Wound class: Clean Contaminated

## 2012-04-24 MED ORDER — PROPOFOL 10 MG/ML IV EMUL
INTRAVENOUS | Status: DC | PRN
  Administered 2012-04-24: 30 mg via INTRAVENOUS
  Administered 2012-04-24: 170 mg via INTRAVENOUS

## 2012-04-24 MED ORDER — ONDANSETRON HCL 4 MG/2ML IJ SOLN: 4.0000 mg | Freq: Four times a day (QID) | INTRAMUSCULAR | Status: DC | PRN

## 2012-04-24 MED ORDER — DILTIAZEM HCL 90 MG PO TABS
90.0000 mg | ORAL_TABLET | Freq: Three times a day (TID) | ORAL | Status: AC
  Administered 2012-04-24 (×2): 90 mg via ORAL
  Filled 2012-04-24 (×5): qty 1

## 2012-04-24 MED ORDER — LIDOCAINE HCL (CARDIAC) 20 MG/ML IV SOLN
INTRAVENOUS | Status: AC
  Filled 2012-04-24: qty 5

## 2012-04-24 MED ORDER — FENTANYL CITRATE 0.05 MG/ML IJ SOLN
INTRAMUSCULAR | Status: AC
  Filled 2012-04-24: qty 5

## 2012-04-24 MED ORDER — HYDROCHLOROTHIAZIDE 25 MG PO TABS: 25.0000 mg | ORAL_TABLET | Freq: Every day | ORAL | Status: DC

## 2012-04-24 MED ORDER — LABETALOL HCL 5 MG/ML IV SOLN
INTRAVENOUS | Status: AC
  Administered 2012-04-24: 20 mg via INTRAVENOUS
  Filled 2012-04-24: qty 4

## 2012-04-24 MED ORDER — PROMETHAZINE HCL 25 MG/ML IJ SOLN
INTRAMUSCULAR | Status: AC
  Filled 2012-04-24: qty 1

## 2012-04-24 MED ORDER — SCOPOLAMINE 1 MG/3DAYS TD PT72
1.0000 | MEDICATED_PATCH | TRANSDERMAL | Status: DC
  Administered 2012-04-24: 1.5 mg via TRANSDERMAL

## 2012-04-24 MED ORDER — NEOSTIGMINE METHYLSULFATE 1 MG/ML IJ SOLN
INTRAMUSCULAR | Status: DC | PRN
  Administered 2012-04-24: 3 mg via INTRAVENOUS

## 2012-04-24 MED ORDER — CEFAZOLIN SODIUM-DEXTROSE 2-3 GM-% IV SOLR
INTRAVENOUS | Status: AC
  Filled 2012-04-24: qty 50

## 2012-04-24 MED ORDER — PHENYLEPHRINE 40 MCG/ML (10ML) SYRINGE FOR IV PUSH (FOR BLOOD PRESSURE SUPPORT)
PREFILLED_SYRINGE | INTRAVENOUS | Status: AC
  Filled 2012-04-24: qty 5

## 2012-04-24 MED ORDER — LISINOPRIL 40 MG PO TABS
40.0000 mg | ORAL_TABLET | Freq: Every day | ORAL | Status: DC
  Administered 2012-04-24: 40 mg via ORAL
  Filled 2012-04-24 (×2): qty 1

## 2012-04-24 MED ORDER — INDIGOTINDISULFONATE SODIUM 8 MG/ML IJ SOLN
INTRAMUSCULAR | Status: AC
  Filled 2012-04-24: qty 5

## 2012-04-24 MED ORDER — HYDROMORPHONE HCL PF 1 MG/ML IJ SOLN
INTRAMUSCULAR | Status: AC
  Administered 2012-04-24: 0.5 mg via INTRAVENOUS
  Filled 2012-04-24: qty 1

## 2012-04-24 MED ORDER — MIDAZOLAM HCL 5 MG/5ML IJ SOLN
INTRAMUSCULAR | Status: DC | PRN
  Administered 2012-04-24: 2 mg via INTRAVENOUS

## 2012-04-24 MED ORDER — PROPOFOL 10 MG/ML IV EMUL
INTRAVENOUS | Status: AC
  Filled 2012-04-24: qty 20

## 2012-04-24 MED ORDER — DILTIAZEM HCL 90 MG PO TABS
90.0000 mg | ORAL_TABLET | Freq: Once | ORAL | Status: AC
  Administered 2012-04-24: 90 mg via ORAL
  Filled 2012-04-24: qty 1

## 2012-04-24 MED ORDER — DEXAMETHASONE SODIUM PHOSPHATE 4 MG/ML IJ SOLN
INTRAMUSCULAR | Status: DC | PRN
  Administered 2012-04-24: 10 mg via INTRAVENOUS

## 2012-04-24 MED ORDER — FENTANYL CITRATE 0.05 MG/ML IJ SOLN
INTRAMUSCULAR | Status: DC | PRN
  Administered 2012-04-24 (×5): 50 ug via INTRAVENOUS

## 2012-04-24 MED ORDER — HYDROMORPHONE HCL PF 1 MG/ML IJ SOLN
0.5000 mg | INTRAMUSCULAR | Status: AC | PRN
  Administered 2012-04-24 (×3): 0.5 mg via INTRAVENOUS

## 2012-04-24 MED ORDER — GLYCOPYRROLATE 0.2 MG/ML IJ SOLN
INTRAMUSCULAR | Status: AC
  Filled 2012-04-24: qty 2

## 2012-04-24 MED ORDER — GLYCOPYRROLATE 0.2 MG/ML IJ SOLN
INTRAMUSCULAR | Status: DC | PRN
  Administered 2012-04-24: 0.4 mg via INTRAVENOUS

## 2012-04-24 MED ORDER — NEOSTIGMINE METHYLSULFATE 1 MG/ML IJ SOLN
INTRAMUSCULAR | Status: AC
  Filled 2012-04-24: qty 10

## 2012-04-24 MED ORDER — ENOXAPARIN SODIUM 40 MG/0.4ML ~~LOC~~ SOLN
40.0000 mg | SUBCUTANEOUS | Status: DC
  Administered 2012-04-24 – 2012-04-25 (×2): 40 mg via SUBCUTANEOUS
  Filled 2012-04-24 (×2): qty 0.4

## 2012-04-24 MED ORDER — SODIUM CHLORIDE 0.9 % IJ SOLN: 9.0000 mL | INTRAMUSCULAR | Status: DC | PRN

## 2012-04-24 MED ORDER — HYDROMORPHONE HCL PF 1 MG/ML IJ SOLN
INTRAMUSCULAR | Status: AC
  Filled 2012-04-24: qty 1

## 2012-04-24 MED ORDER — PHENYLEPHRINE HCL 10 MG/ML IJ SOLN
INTRAMUSCULAR | Status: DC | PRN
  Administered 2012-04-24: 80 ug via INTRAVENOUS

## 2012-04-24 MED ORDER — KETOROLAC TROMETHAMINE 30 MG/ML IJ SOLN: 15.0000 mg | Freq: Once | INTRAMUSCULAR | Status: DC | PRN

## 2012-04-24 MED ORDER — DEXAMETHASONE SODIUM PHOSPHATE 10 MG/ML IJ SOLN
INTRAMUSCULAR | Status: AC
  Filled 2012-04-24: qty 1

## 2012-04-24 MED ORDER — HYDROCHLOROTHIAZIDE 25 MG PO TABS
25.0000 mg | ORAL_TABLET | Freq: Every day | ORAL | Status: DC
  Administered 2012-04-24 – 2012-04-26 (×2): 25 mg via ORAL
  Filled 2012-04-24 (×3): qty 1

## 2012-04-24 MED ORDER — BUPIVACAINE HCL (PF) 0.25 % IJ SOLN
INTRAMUSCULAR | Status: AC
  Filled 2012-04-24: qty 30

## 2012-04-24 MED ORDER — DIPHENHYDRAMINE HCL 50 MG/ML IJ SOLN: 12.5000 mg | Freq: Four times a day (QID) | INTRAMUSCULAR | Status: DC | PRN

## 2012-04-24 MED ORDER — HYDROMORPHONE HCL PF 1 MG/ML IJ SOLN
INTRAMUSCULAR | Status: DC | PRN
  Administered 2012-04-24 (×2): 0.5 mg via INTRAVENOUS

## 2012-04-24 MED ORDER — ONDANSETRON HCL 4 MG/2ML IJ SOLN
INTRAMUSCULAR | Status: AC
  Filled 2012-04-24: qty 2

## 2012-04-24 MED ORDER — MORPHINE SULFATE (PF) 1 MG/ML IV SOLN
INTRAVENOUS | Status: DC
  Administered 2012-04-24: 14:00:00 via INTRAVENOUS
  Administered 2012-04-24: 7.19 mg via INTRAVENOUS
  Administered 2012-04-24: via INTRAVENOUS
  Administered 2012-04-24: 16.5 mg via INTRAVENOUS
  Administered 2012-04-25: 9 mg via INTRAVENOUS
  Filled 2012-04-24 (×3): qty 25

## 2012-04-24 MED ORDER — ONDANSETRON HCL 4 MG/2ML IJ SOLN
INTRAMUSCULAR | Status: DC | PRN
  Administered 2012-04-24: 4 mg via INTRAVENOUS

## 2012-04-24 MED ORDER — LACTATED RINGERS IV SOLN
INTRAVENOUS | Status: DC
  Administered 2012-04-24: 16:00:00 via INTRAVENOUS
  Administered 2012-04-24: 1000 mL via INTRAVENOUS
  Administered 2012-04-24 – 2012-04-25 (×2): via INTRAVENOUS

## 2012-04-24 MED ORDER — LIDOCAINE HCL (CARDIAC) 20 MG/ML IV SOLN
INTRAVENOUS | Status: DC | PRN
  Administered 2012-04-24: 30 mg via INTRAVENOUS
  Administered 2012-04-24: 20 mg via INTRAVENOUS

## 2012-04-24 MED ORDER — ROCURONIUM BROMIDE 50 MG/5ML IV SOLN
INTRAVENOUS | Status: AC
  Filled 2012-04-24: qty 1

## 2012-04-24 MED ORDER — HYDROMORPHONE HCL PF 1 MG/ML IJ SOLN
0.2500 mg | INTRAMUSCULAR | Status: DC | PRN
  Administered 2012-04-24 (×2): 0.5 mg via INTRAVENOUS

## 2012-04-24 MED ORDER — NALOXONE HCL 0.4 MG/ML IJ SOLN: 0.4000 mg | INTRAMUSCULAR | Status: DC | PRN

## 2012-04-24 MED ORDER — LACTATED RINGERS IV SOLN
INTRAVENOUS | Status: DC
  Administered 2012-04-24 (×3): via INTRAVENOUS

## 2012-04-24 MED ORDER — LEVOTHYROXINE SODIUM 75 MCG PO TABS
275.0000 ug | ORAL_TABLET | Freq: Every day | ORAL | Status: DC
  Administered 2012-04-25: 275 ug via ORAL
  Filled 2012-04-24 (×2): qty 1

## 2012-04-24 MED ORDER — DIPHENHYDRAMINE HCL 12.5 MG/5ML PO ELIX
12.5000 mg | ORAL_SOLUTION | Freq: Four times a day (QID) | ORAL | Status: DC | PRN
  Filled 2012-04-24: qty 5

## 2012-04-24 MED ORDER — HYDRALAZINE HCL 20 MG/ML IJ SOLN: 10.0000 mg | Freq: Four times a day (QID) | INTRAMUSCULAR | Status: DC | PRN

## 2012-04-24 MED ORDER — ROCURONIUM BROMIDE 100 MG/10ML IV SOLN
INTRAVENOUS | Status: DC | PRN
  Administered 2012-04-24: 40 mg via INTRAVENOUS
  Administered 2012-04-24: 10 mg via INTRAVENOUS

## 2012-04-24 MED ORDER — LISINOPRIL 20 MG PO TABS
20.0000 mg | ORAL_TABLET | Freq: Every day | ORAL | Status: DC
  Filled 2012-04-24 (×2): qty 1

## 2012-04-24 MED ORDER — KETOROLAC TROMETHAMINE 30 MG/ML IJ SOLN
30.0000 mg | Freq: Four times a day (QID) | INTRAMUSCULAR | Status: AC
  Administered 2012-04-25 (×2): 30 mg via INTRAVENOUS
  Filled 2012-04-24 (×2): qty 1

## 2012-04-24 MED ORDER — PROMETHAZINE HCL 25 MG/ML IJ SOLN
6.2500 mg | INTRAMUSCULAR | Status: DC | PRN
  Administered 2012-04-24: 6.25 mg via INTRAVENOUS

## 2012-04-24 MED ORDER — OXYCODONE-ACETAMINOPHEN 5-325 MG PO TABS
1.0000 | ORAL_TABLET | Freq: Four times a day (QID) | ORAL | Status: DC | PRN
  Administered 2012-04-25: 1 via ORAL
  Administered 2012-04-25 – 2012-04-26 (×3): 2 via ORAL
  Filled 2012-04-24: qty 1
  Filled 2012-04-24 (×2): qty 2
  Filled 2012-04-24: qty 1
  Filled 2012-04-24 (×2): qty 2

## 2012-04-24 MED ORDER — HYDROCHLOROTHIAZIDE 25 MG PO TABS: 25.0000 mg | ORAL_TABLET | ORAL | Status: DC

## 2012-04-24 MED ORDER — DILTIAZEM HCL ER COATED BEADS 300 MG PO CP24
300.0000 mg | ORAL_CAPSULE | Freq: Every day | ORAL | Status: DC
  Administered 2012-04-25 – 2012-04-26 (×2): 300 mg via ORAL
  Filled 2012-04-24 (×2): qty 1

## 2012-04-24 MED ORDER — SCOPOLAMINE 1 MG/3DAYS TD PT72
MEDICATED_PATCH | TRANSDERMAL | Status: AC
  Filled 2012-04-24: qty 1

## 2012-04-24 MED ORDER — LABETALOL HCL 5 MG/ML IV SOLN
20.0000 mg | Freq: Once | INTRAVENOUS | Status: AC
  Administered 2012-04-24: 20 mg via INTRAVENOUS

## 2012-04-24 MED ORDER — MIDAZOLAM HCL 2 MG/2ML IJ SOLN
INTRAMUSCULAR | Status: AC
  Filled 2012-04-24: qty 2

## 2012-04-24 MED ORDER — MEPERIDINE HCL 25 MG/ML IJ SOLN: 6.2500 mg | INTRAMUSCULAR | Status: DC | PRN

## 2012-04-24 SURGICAL SUPPLY — 65 items
3-0 PLAIN GUT #1630 ×3 IMPLANT
ADH SKN CLS APL DERMABOND .7 (GAUZE/BANDAGES/DRESSINGS) ×2
BLADE SURG 10 STRL SS (BLADE) ×3 IMPLANT
BLADE SURG 15 STRL LF C SS BP (BLADE) ×2 IMPLANT
BLADE SURG 15 STRL SS (BLADE) ×3
CABLE HIGH FREQUENCY MONO STRZ (ELECTRODE) ×3 IMPLANT
CLOTH BEACON ORANGE TIMEOUT ST (SAFETY) ×3 IMPLANT
CONT PATH 16OZ SNAP LID 3702 (MISCELLANEOUS) IMPLANT
COVER MAYO STAND STRL (DRAPES) ×3 IMPLANT
COVER TABLE BACK 60X90 (DRAPES) ×3 IMPLANT
DERMABOND ADVANCED (GAUZE/BANDAGES/DRESSINGS) ×1
DERMABOND ADVANCED .7 DNX12 (GAUZE/BANDAGES/DRESSINGS) ×2 IMPLANT
DEVICE SUTURE ENDOST 10MM (ENDOMECHANICALS) IMPLANT
DISSECTOR BLUNT TIP ENDO 5MM (MISCELLANEOUS) IMPLANT
DRSG COVADERM 4X10 (GAUZE/BANDAGES/DRESSINGS) ×3 IMPLANT
ENDOSTITCH 0 SINGLE 48 (SUTURE) IMPLANT
EVACUATOR SMOKE 8.L (FILTER) IMPLANT
GAUZE XEROFORM 5X9 LF (GAUZE/BANDAGES/DRESSINGS) ×3 IMPLANT
GLOVE BIO SURGEON STRL SZ7.5 (GLOVE) ×6 IMPLANT
GLOVE BIOGEL PI IND STRL 6.5 (GLOVE) IMPLANT
GLOVE BIOGEL PI IND STRL 8 (GLOVE) ×2 IMPLANT
GLOVE BIOGEL PI INDICATOR 6.5 (GLOVE)
GLOVE BIOGEL PI INDICATOR 8 (GLOVE) ×1
GLOVE ECLIPSE 7.5 STRL STRAW (GLOVE) ×6 IMPLANT
GOWN PREVENTION PLUS LG XLONG (DISPOSABLE) ×6 IMPLANT
HEMOSTAT SURGICEL 2X3 (HEMOSTASIS) ×3 IMPLANT
NEEDLE HYPO 22GX1.5 SAFETY (NEEDLE) ×3 IMPLANT
NS IRRIG 1000ML POUR BTL (IV SOLUTION) ×3 IMPLANT
OCCLUDER COLPOPNEUMO (BALLOONS) IMPLANT
PACK LAVH (CUSTOM PROCEDURE TRAY) ×3 IMPLANT
SCALPEL HARMONIC ACE (MISCELLANEOUS) ×3 IMPLANT
SCISSORS LAP 5X35 DISP (ENDOMECHANICALS) IMPLANT
SET CYSTO W/LG BORE CLAMP LF (SET/KITS/TRAYS/PACK) IMPLANT
SET IRRIG TUBING LAPAROSCOPIC (IRRIGATION / IRRIGATOR) IMPLANT
SPONGE LAP 18X18 X RAY DECT (DISPOSABLE) ×6 IMPLANT
STAPLER VISISTAT 35W (STAPLE) ×3 IMPLANT
SUT PLAIN 3 0 PS2 27 (SUTURE) IMPLANT
SUT VIC AB 0 CT1 18XCR BRD8 (SUTURE) ×4 IMPLANT
SUT VIC AB 0 CT1 27 (SUTURE)
SUT VIC AB 0 CT1 27XBRD ANBCTR (SUTURE) IMPLANT
SUT VIC AB 0 CT1 8-18 (SUTURE) ×4
SUT VIC AB 2-0 SH 27 (SUTURE)
SUT VIC AB 2-0 SH 27XBRD (SUTURE) IMPLANT
SUT VIC AB 3-0 CT1 27 (SUTURE) ×2
SUT VIC AB 3-0 CT1 TAPERPNT 27 (SUTURE) ×2 IMPLANT
SUT VIC AB 3-0 SH 27 (SUTURE)
SUT VIC AB 3-0 SH 27X BRD (SUTURE) IMPLANT
SUT VICRYL 0 TIES 12 18 (SUTURE) ×3 IMPLANT
SUT VICRYL 0 UR6 27IN ABS (SUTURE) ×3 IMPLANT
SUT VICRYL RAPIDE 3 0 (SUTURE) ×3 IMPLANT
SYR 50ML LL SCALE MARK (SYRINGE) ×3 IMPLANT
SYR BULB IRRIGATION 50ML (SYRINGE) ×3 IMPLANT
SYR CONTROL 10ML LL (SYRINGE) ×6 IMPLANT
TIP UTERINE 5.1X6CM LAV DISP (MISCELLANEOUS) IMPLANT
TIP UTERINE 6.7X10CM GRN DISP (MISCELLANEOUS) IMPLANT
TIP UTERINE 6.7X6CM WHT DISP (MISCELLANEOUS) IMPLANT
TIP UTERINE 6.7X8CM BLUE DISP (MISCELLANEOUS) ×3 IMPLANT
TOWEL OR 17X24 6PK STRL BLUE (TOWEL DISPOSABLE) ×6 IMPLANT
TRAY FOLEY CATH 14FR (SET/KITS/TRAYS/PACK) ×3 IMPLANT
TROCAR 12M 150ML BLUNT (TROCAR) ×3 IMPLANT
TROCAR BALLN 12MMX100 BLUNT (TROCAR) ×3 IMPLANT
TROCAR XCEL NON-BLD 11X100MML (ENDOMECHANICALS) ×6 IMPLANT
TROCAR XCEL NON-BLD 5MMX100MML (ENDOMECHANICALS) ×3 IMPLANT
WARMER LAPAROSCOPE (MISCELLANEOUS) ×3 IMPLANT
WATER STERILE IRR 1000ML POUR (IV SOLUTION) ×3 IMPLANT

## 2012-04-24 NOTE — OR Nursing (Signed)
procedure conversion from laparoscopic hysterectomy to open abdominal hysterectomy at 08:17

## 2012-04-24 NOTE — Consult Note (Signed)
[facility] Medical Consultation  Alvino Chapel ZOX:[id number] DOB: 11-10-1970 DOA: 04/24/2012 PCP: Tobin Chad, MD   Requesting physician: Dr Reynaldo Minium Date of consultation: 04/24/12.  Reason for consultation: Uncontrolled hypertension.   Impression/Recommendations Active Problems:  HTN (hypertension)  Hypothyroidism    1. Uncontrolled HTN: Patient has a known history of HTN, and is currently on multiple antihypertensives, including HCTZ, Cardizem and ACE-i. Despite this, she has had persistently elevated BP, post operatively, although surgical pain may have been contributory. BP is currently 159/90, after iv Labetalol. She however, has no symptoms of chest pain, SOB headache or visual obscuration. We shall continue current antihypertensives, but change Cardizem to Cardizem CD, effective in AM 04/25/12. Meanwhile, we shall increase Lisinopril to 40 mg daily, and utilize prn Hydralazine, for SBP>160.  2. Dysthyroidism: Patient is on thyroxine replacement therapy, and her Synthroid was increased about a week ago, from 125 mcg daily, to 225 mcg daily. She assures me that she has been compliant. TSH is markedly elevated at 131.302. Will increase synthroid further, to 275 mcg daily. Further increase will likely be needed, but will defer this to her PMD/Endocrinilogist, on discahrge. Repeat TSH in 2-4 weeks, is recommended.   I will follow up again tomorrow. Please contact me if I can be of assistance in the meanwhile. Thank you for this consultation.   HPI:  41 year old female, with known history of HTN, s/p previous CVA, hyperthyroidism/multi-nodular goiter, s/p RAI, now hypothyroid, anemia, impaired Glucose tolerance, hospitalized at [facility] 04/24/12, for pelvic pain, dysmenorrhea/menorrhagia, and is s/p abdominal hysterectomy on the same date. We are requested to assist in managing her uncontrolled HTN, peri-operatively   Review of Systems:  As per HPI and chief  complaint. Patent denies fatigue, diminished appetite, weight loss, fever, chills, headache, blurred vision, difficulty in speaking, dyspaphagia, chest pain, cough, shortness of breath, orthopnea, paroxysmal nocturnal dyspnea. Abdominal pain is tolerable. No vomiting, diarrhea, belching, heartburn, hematemesis, melena, dysuria, nocturia, urinary frequency, hematochezia, lower extremity swelling, pain, or redness. The rest of the systems review is negative.    Past Medical History  Diagnosis Date  . Hyperthyroidism 10/2000    RAI treatment,  multinodular goiter  . Ectopic pregnancy 04/2006    treated with medication  . Hepatic steatosis 07/2006    on Abd CT done for pain  . 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 Preterm delivery 12/2010    placental abruption, maternal htn, DM  . Hypertension   . Anemia   . ABORTION, SPONTANEOUS 08/18/2007    Qualifier: History of  By: Sheffield Slider MD, Deniece Portela    . Lateral epicondylitis of left elbow 08/22/2009    Qualifier: Diagnosis of  By: Sharen Hones  MD, Wynona Canes    . IMPAIRED GLUCOSE TOLERANCE 08/18/2007    Qualifier: Diagnosis of  By: Sheffield Slider MD, Deniece Portela    . Hypothyroidism    Past Surgical History  Procedure Date  . Appendectomy 1997  . Cholecystectomy 1997  . Ovarian cyst removal   . Laparoscopic lysis intestinal adhesions   . Tubal ligation   . Thyroid surgery     goiter removed  . Cesarean section     x 1   Social History:  reports that she has never smoked. She has never used smokeless tobacco. She reports that she does not drink alcohol or use illicit drugs.  No Known Allergies Family History  Problem Relation Age of Onset  . Hypertension Mother   .  Hypertension Father   . Stroke Father     hemorrhagic    Prior to Admission medications   Medication Sig Start Date End Date Taking? Authorizing Provider  aspirin (ASPIR-LOW) 81 MG EC tablet Take 81 mg by mouth daily.    Yes Historical Provider, MD  diltiazem  (CARDIZEM) 90 MG tablet Take 1 tablet (90 mg total) by mouth 3 (three) times daily. 04/14/12  Yes Tobin Chad, MD  enoxaparin (LOVENOX) 40 MG/0.4ML injection Inject 0.4 mLs (40 mg total) into the skin daily. First dose 7 PM the night prior to surgery 04/23/12 06/07/12 Yes Ok Edwards, MD  hydrochlorothiazide 25 MG tablet Take 1 tablet (25 mg total) by mouth every morning. 04/03/11  Yes Tobin Chad, MD  levothyroxine (LEVOTHROID) 25 MCG tablet Take 1 tablet (25 mcg total) by mouth daily. 04/14/12 04/14/13 Yes Tobin Chad, MD  levothyroxine (SYNTHROID, LEVOTHROID) 200 MCG tablet Take 0.5 tablets (100 mcg total) by mouth daily. Increased back to 200 mcg by Zachery Dauer, MD due to a very high TSH on the 100 mcg dose. 04/04/12  Yes Tobin Chad, MD  lisinopril (PRINIVIL,ZESTRIL) 20 MG tablet Take 20 mg by mouth at bedtime.   Yes Historical Provider, MD  ferrous sulfate 325 (65 FE) MG tablet Take 325 mg by mouth 3 (three) times daily with meals.     Historical Provider, MD  prenatal vitamin w/FE, FA (PRENATAL 1 + 1) 27-1 MG TABS Take 1 tablet by mouth daily.    Historical Provider, MD   Physical Exam: Blood pressure 159/90, pulse 90, temperature 98.5 F (36.9 C), temperature source Oral, resp. rate 17, height 5\' 2"  (1.575 m), weight 73.483 kg (162 lb), SpO2 100.00%. Filed Vitals:   04/24/12 1338 04/24/12 1343 04/24/12 1400 04/24/12 1440  BP: 167/95   159/90  Pulse: 99   90  Temp: 98.2 F (36.8 C)   98.5 F (36.9 C)  TempSrc:    Oral  Resp: 18 18  17   Height:   5\' 2"  (1.575 m)   Weight:   73.483 kg (162 lb)   SpO2: 98% 97%  100%    General: Comfortable, alert, communicative, fully oriented, not short of breath at rest.  HEENT:  Mild clinical pallor, no jaundice, no conjunctival injection or discharge. Hydration status is fair.  NECK:  Supple, JVP not seen, no carotid bruits, no palpable lymphadenopathy, no palpable goiter. CHEST:  Clinically clear to auscultation, no wheezes,  no crackles. HEART:  Sounds 1 and 2 heard, normal, regular, no murmurs. ABDOMEN:  Full, soft, old surgical scars seen, mildly tender in lower abdomen, no palpable organomegaly, no palpable masses. Bowel sounds are heard. Dressings are dry.  GENITALIA:  Not exasmined. LOWER EXTREMITIES:  No pitting edema, palpable peripheral pulses. MUSCULOSKELETAL SYSTEM:  Unremarkable. CENTRAL NERVOUS SYSTEM:  No focal neurologic deficit on gross examination.  Labs on Admission:  Basic Metabolic Panel: No results found for this basename: NA:5,K:5,CL:5,CO2:5,GLUCOSE:5,BUN:5,CREATININE:5,CALCIUM:5,MG:5,PHOS:5 in the last 168 hours Liver Function Tests: No results found for this basename: AST:5,ALT:5,ALKPHOS:5,BILITOT:5,PROT:5,ALBUMIN:5 in the last 168 hours No results found for this basename: LIPASE:5,AMYLASE:5 in the last 168 hours No results found for this basename: AMMONIA:5 in the last 168 hours CBC: No results found for this basename: WBC:5,NEUTROABS:5,HGB:5,HCT:5,MCV:5,PLT:5 in the last 168 hours Cardiac Enzymes: No results found for this basename: CKTOTAL:5,CKMB:5,CKMBINDEX:5,TROPONINI:5 in the last 168 hours BNP: No components found with this basename: POCBNP:5 CBG: No results found for this basename: GLUCAP:5 in the last 168 hours  Radiological Exams on Admission: No results found.  EKG: Independently reviewed. This is NSR, unremarkable.   Time spent: 45 mins.   Niyati Heinke,CHRISTOPHER [facility] Pager [phone].  If 7PM-7AM, please contact night-coverage [email or web address] Password [facility] 04/24/2012, 3:23 PM

## 2012-04-24 NOTE — Anesthesia Postprocedure Evaluation (Signed)
Anesthesia Post Note  Patient: [name]  Procedure(s) Performed: Procedure(s) (LRB): HYSTERECTOMY TOTAL LAPAROSCOPIC (N/A) HYSTERECTOMY ABDOMINAL ()  Anesthesia type: General  Patient location: PACU  Post pain: Pain level controlled  Post assessment: Post-op Vital signs reviewed  Last Vitals:  Filed Vitals:   04/24/12 1315  BP: 169/95  Pulse: 94  Temp: 37.1 C  Resp: 17    Post vital signs: Reviewed  Level of consciousness: sedated  Complications: No apparent anesthesia complications.  BP improved after 2 doses of labetalol IV.  Dr Lily Peer aware and has consulted hospitalist service to assist with BP management.  Jasmine December, MD

## 2012-04-24 NOTE — Interval H&P Note (Signed)
History and Physical Interval Note:  04/24/2012 7:08 AM  [name]  has presented today for surgery, with the diagnosis of menorrhagia, dysfunctional uterine bleeding  The various methods of treatment have been discussed with the patient and family. After consideration of risks, benefits and other options for treatment, the patient has consented to  Procedure(s) (LRB): HYSTERECTOMY TOTAL LAPAROSCOPIC (N/A) as a surgical intervention .  The patient's history has been reviewed, patient examined, no change in status, stable for surgery.  I have reviewed the patient's chart and labs.  Questions were answered to the patient's satisfaction.     Ok Edwards

## 2012-04-24 NOTE — Op Note (Signed)
04/24/2012  10:01 AM  PATIENT:  [name]  41 y.o. female  PRE-OPERATIVE DIAGNOSIS:  menorrhagia, dysfunctional uterine bleeding, fibroids POST-OPERATIVE DIAGNOSIS:  menorrhagia, dysfunctional uterine bleeding, fibroids  PROCEDURE:  Procedure(s): Attempted laparoscopic hysterectomy Abdominal pelvic adhesio lysis  Total abdominal hysterectomy  SURGEON:  Surgeon(s): Ok Edwards, MD Dara Lords, MD  ANESTHESIA:   general  FINDINGS: Extensive abdominal pelvic adhesions. Left lower abdominal omentum adhered to anterior abdominal wall. Thick uterine band in the fundus attached to the anterior abdominal wall. Right lower abdomen omentum adhesion to anterior abdominal wall. Normal-appearing right and left ovary. Evidence of previous Hulka clips. Uterus normal size.  DESCRIPTION OF OPERATION:The patient was taken to the operating room where she underwent successful general endotracheal anesthesia. The patient was then positioned in the laparoscopic lithotomy position, with aseptic prepping and draping. After dilating the cervix to Hegar 8, the uterus was sounded to measure its length, and then a RUMI disposable tip less than or equal to the length that was sounded was used 8). The KOH Cup was selected based on the size of the cervix and ascertaining that it covered it (3.5). After connecting the disposable tip to the RUMI uterine manipulator,the pneumo-occluder was placed over the tip and shaft of the RUMI, and then fit the KOH cup over the tip. Using lateral and upper and lower vaginal retractors,  a tenaculum was placed on the anterior cervix lip and pulled it down. Next, surgical gel was applied to the RUMI tip and inserted it in the cervical os. The posterior retractor was removed, as well as the anterior one. With sidewall retractors in place, the tip was inserted into the uterus and the cup placed in the vagina. . At this point, while releasing the first tenaculum, I introduced a  second tenaculum through the fenestration of the cup to catch the cervix. I then pushed the RUMI tip further into the uterus to engage the cup around the cervix and against the fornix by pushing the RUMI cephalad, with countertraction provided by the second tenaculum.I then inflated the uterine balloon with 5 cc of water, and then inserted a three-way Foley catheter for bladder drainage. .   The abdomen was previously prepped and draped in the usual sterile fashion. A small subumbilical incision was made and a 10/11 mm Optiview trocar was introduced into the abdominal cavity. A pneumoperitoneum was established with 2 to 3 L of CO2. The patient was then placed in Trendelenburg position.  After the trocar was in place and the camera was attached it was evidence that patient had the following:Extensive abdominal pelvic adhesions. Left lower abdominal omentum adhered to anterior abdominal wall. Thick uterine band in the fundus attached to the anterior abdominal wall. Right lower abdomen omentum adhesion to anterior abdominal wall. Normal-appearing right and left ovary. Evidence of previous Hulka clips. Uterus normal size.  It was decided at this point to proceed with an open abdominal hysterectomy. The pneumoperitoneum was removed the subcutaneous tissue was reapproximated with 3-0 Vicryl suture and the skin was reapproximated with Dermabond glue. Patient was repositioned the uterine manipulator was removed. A Pfannenstiel skin incision was made over previous Pfannenstiel scar. The incision was carried down from the skin down to the subcutaneous tissue where a midline neck was made and the fascia was incised in a transverse fashion. The peritoneal cavity was entered cautiously meticulous dissection was required to free the omentum that was attached to the anterior abdominal wall in an effort to place  the O'Connor-O'Sullivan retractor. The patient was then placed in Trendelenburg position. The proximal left  utero-ovarian ligament and fallopian tube were grasped with a Haney clamp and the same on the contralateral side for traction. The right round ligament was identified was transected with the Bovie. The posterior broad ligament was penetrated with the surgeon's finger and the proximal utero-ovarian ligament fallopian tube were doubly clamped. With the Mayo scissors the pedicles were cut and secured with 0 Vicryl suture followed by transfixation stitch of 0 Vicryl suture as well. The bladder flap was established pushing the bladder from the lower uterine segment and the remaining broad and cardinal ligaments were serially clamped cut and suture ligated with 0 Vicryl suture to the area of the right vaginal fornix. Similar procedure was carried out on the contralateral side. With 2 curved Heaneys on the right and left fornix respectively the Jergensen scissor was then utilized to free the cervix from the vagina and passed the uterus and cervix off the field for histological evaluation. Both angles were independently secured with transfixation stitch of 0 Vicryl suture and the remaining vaginal cuff was closed with figure-of-eight of 0 Vicryl suture. The pelvic cavity was encompassed irrigated with normal saline solution. Surgicel was placed near the cuff for additional hemostasis. The sponge count needle count were correct the O'Connor-O'Sullivan retractor was then removed. The visceral peritoneum was not reapproximated. The rectus fascia was closed with a running stitch of 0 Vicryl suture. The subcutaneous tissue was reapproximated with a running stitch of 3-0 Vicryl suture. The skin was reapproximated with skin clips. For postoperative analgesia quarter percent Marcaine was infiltrated into the Pfannenstiel incision site for a total of 15 cc. Xeroform gauze and 4 x 4 dressing was placed. The patient was extubated and transferred to recovery room stable vital signs.    ESTIMATED BLOOD LOSS: Approximately 150  cc  Intake/Output Summary (Last 24 hours) at 04/24/12 1001 Last data filed at 04/24/12 9604  Gross per 24 hour  Intake   2200 ml  Output    210 ml  Net   1990 ml     BLOOD ADMINISTERED:none   LOCAL MEDICATIONS USED:  MARCAINE  0.25% subcutaneous incision site total 15 cc  SPECIMEN:  Source of Specimen:  Uterus and cervix  DISPOSITION OF SPECIMEN:  PATHOLOGY  COUNTS:  YES  PLAN OF CARE: Transfer to PACU  [facility] HMD10:01 AMTD@

## 2012-04-24 NOTE — H&P (View-Only) (Signed)
[name] is an 41 y.o. female. Gravida 1 para 1 (cesarean section/BTL). Patient been followed in the office since March of this year as a result of her ongoing menorrhagia. She's been followed by Dr. Hale for her chronic hypertension, hyperlipidemia, and hypothyroidism.. Patient with prior history of stroke many years ago requiring anticoagulations for 6 months. She had received also anticoagulation during her past pregnancy and postpartum last year. Patient with past history of laparoscopic cholecystectomy here in the [place]. 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  In March 2013 patient had a sonohysterogram and endometrial biopsy whereby a thin-walled ovarian cyst measuring 36 x 36 x 33 mm was noted and one small fundal fibroid measured 23 x 20 mm contralateral ovary was normal. She received a shot of Depo-Provera 150 mg in effort to suppress the cyst and to control her bleeding. Patient also was found to be anemic with a hemoglobin of 9.8 and was started on iron supplementation. Her endometrial biopsy was benign. On April 4 a Mirena IUD was placed in effort to control her menorrhagia and returned back on July 22 because of worsening bleeding with the IUD and it was removed. Patient also had been given Megace in the past to help control her bleeding. Patient presented to the office today for preoperative consultation and for an ultrasound followup of the ovarian cyst. Today's ultrasound demonstrated a uterus measuring 9.9 x 6.1 x 5.0 cm with endometrial stripe of 12.7 mm she had 3 small fibroids the last one measuring 23 x 17 mm ovaries appeared to be normal. Patient's recent hemoglobin one week ago was 11.5 g.  Pertinent Gynecological History: Menses: Menorrhagia lasting 7-12 days Bleeding: Same as above Contraception: tubal ligation DES exposure: denies Blood transfusions: none Sexually transmitted diseases: no past history Previous GYN  Procedures: C-section and tubal ligation  Last mammogram: normal Date: ? Last pap: normal Date: 2012 OB History: G 1, P 1   Menstrual History: Menarche age: 12 No LMP recorded.    Past Medical History  Diagnosis Date  . Hyperthyroidism 10/2000    RAI treatment,  multinodular goiter  . Ectopic pregnancy 04/2006    treated with medication  . Hepatic steatosis 07/2006    on Abd CT done for pain  . 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 Preterm delivery 12/2010    placental abruption, maternal htn, DM  . Hypertension   . Anemia     Past Surgical History  Procedure Date  . Appendectomy 1997  . Cholecystectomy 1997  . Ovarian cyst removal   . Laparoscopic lysis intestinal adhesions   . Tubal ligation     Family History  Problem Relation Age of Onset  . Hypertension Mother   . Hypertension Father   . Stroke Father     hemorrhagic    Social History:  reports that she has never smoked. She has never used smokeless tobacco. She reports that she does not drink alcohol or use illicit drugs.  Allergies: No Known Allergies   (Not in a hospital admission)  REVIEW OF SYSTEMS: A ROS was performed and pertinent positives and negatives are included in the history.  GENERAL: No fevers or chills. HEENT: No change in vision, no earache, sore throat or sinus congestion. NECK: No pain or stiffness. CARDIOVASCULAR: No chest pain or pressure. No palpitations. PULMONARY: No shortness of breath, cough or wheeze. GASTROINTESTINAL: No abdominal   pain, nausea, vomiting or diarrhea, melena or bright red blood per rectum. GENITOURINARY: No urinary frequency, urgency, hesitancy or dysuria. MUSCULOSKELETAL: No joint or muscle pain, no back pain, no recent trauma. DERMATOLOGIC: No rash, no itching, no lesions. ENDOCRINE: No polyuria, polydipsia, no heat or cold intolerance. No recent change in weight. HEMATOLOGICAL: Anemia. NEUROLOGIC: No headache, seizures, numbness,  tingling or weakness. PSYCHIATRIC: No depression, no loss of interest in normal activity or change in sleep pattern.     Blood pressure 130/92.  Physical Exam:  HEENT:unremarkable Neck:Supple, midline, no thyroid megaly, no carotid bruits Lungs:  Clear to auscultation no rhonchi's or wheezes Heart:Regular rate and rhythm, no murmurs or gallops Breast Exam: Symmetrical in appearance no palpable masses or tenderness no supraclavicular axillary lymphadenopathy Abdomen: Midline scar as well as Pfannenstiel scar as well as ports scars from laparoscopic cholecystectomy Pelvic:BUS within normal limits  Vagina: Dark brown menstrual blood Cervix: No lesions or discharge Uterus: Anteverted normal size shape and consistency Adnexa: No palpable masses or tenderness Extremities: No cords, no edema Rectal: Not done  No results found for this or any previous visit (from the past 24 hour(s)).  No results found.  Assessment/Plan: 41-year-old gravida 1 para 1 with metromenorrhagia. Patient with prior Mirena IUD failure as well as Depo-Provera injection. Patient would like to proceed with definitive treatment. Patient has had ruptured appendix and has midline scar as well as a Pfannenstiel scar from previous cesarean section. We'll approach this procedure with a total laparoscopic hysterectomy and ovarian conservation. Patient was extensively counseled of the potential for possible open laparotomy in case of technical difficulty due to scar tissue from previous surgeries. And also her increased risk for intra-abdominal trauma or injury. She will continue her R. supplementation. She was instructed to refrain from any aspirin-containing products the week prior her surgery. Because of her past history of stroke many years ago she will be placed on Lovenox 40 mg subcutaneous starting 12 hours before her surgery and she will continue to do so daily for 6 weeks postop. In addition the following risk were  discussed:                        Patient was counseled as to the risk of surgery to include the following:  1. Infection (prohylactic antibiotics will be administered)  2. DVT/Pulmonary Embolism (prophylactic pneumo compression stockings will be used)  3.Trauma to internal organs requiring additional surgical procedure to repair any injury to     Internal organs requiring perhaps additional hospitalization days.  4.Hemmorhage requiring transfusion and blood products which carry risks such as             anaphylactic reaction, hepatitis and AIDS  Patient had received literature information on the procedure scheduled and all her questions were answered in her native tongue and accepts all risk.  Medical clearance from Dr. Hale was requested patient will be seen him later today.  Ndia Sampath HMD12:39 PMTD@   Avarose Mervine H 03/31/2012, 12:23 PM   

## 2012-04-24 NOTE — Transfer of Care (Signed)
Immediate Anesthesia Transfer of Care Note  Patient: [name]  Procedure(s) Performed: Procedure(s) (LRB): HYSTERECTOMY TOTAL LAPAROSCOPIC (N/A) HYSTERECTOMY ABDOMINAL ()  Patient Location: PACU  Anesthesia Type: General  Level of Consciousness: awake, sedated and patient cooperative  Airway & Oxygen Therapy: Patient Spontanous Breathing and Patient connected to nasal cannula oxygen  Post-op Assessment: Report given to PACU RN and Post -op Vital signs reviewed and stable  Post vital signs: Reviewed and stable  Complications: No apparent anesthesia complications

## 2012-04-24 NOTE — Anesthesia Postprocedure Evaluation (Signed)
  Anesthesia Post-op Note  Patient: [name]  Procedure(s) Performed: Procedure(s) (LRB): HYSTERECTOMY TOTAL LAPAROSCOPIC (N/A) HYSTERECTOMY ABDOMINAL ()  Patient Location: [facility] Unit  Anesthesia Type: General  Level of Consciousness: sedated  Airway and Oxygen Therapy: Patient Spontanous Breathing and Patient connected to nasal cannula oxygen  Post-op Pain: mild  Post-op Assessment: Post-op Vital signs reviewed  Post-op Vital Signs: Reviewed and stable  Complications: No apparent anesthesia complications

## 2012-04-24 NOTE — Addendum Note (Signed)
Addendum  created 04/24/12 1638 by Algis Greenhouse, CRNA   Modules edited:Notes Section

## 2012-04-24 NOTE — Anesthesia Preprocedure Evaluation (Signed)
Anesthesia Evaluation  Patient identified by MRN, date of birth, ID band Patient awake    Reviewed: Allergy & Precautions, H&P , NPO status , Patient's Chart, lab work & pertinent test results  Airway Mallampati: II TM Distance: >3 FB Neck ROM: full    Dental No notable dental hx. (+) Teeth Intact   Pulmonary neg pulmonary ROS,    Pulmonary exam normal       Cardiovascular hypertension, Pt. on medications     Neuro/Psych negative neurological ROS  negative psych ROS   GI/Hepatic negative GI ROS, Neg liver ROS,   Endo/Other    Renal/GU negative Renal ROS  negative genitourinary   Musculoskeletal negative musculoskeletal ROS (+)   Abdominal Normal abdominal exam  (+)   Peds negative pediatric ROS (+)  Hematology negative hematology ROS (+)   Anesthesia Other Findings   Reproductive/Obstetrics negative OB ROS                           Anesthesia Physical Anesthesia Plan  ASA: II  Anesthesia Plan: General   Post-op Pain Management:    Induction: Intravenous  Airway Management Planned: Oral ETT  Additional Equipment:   Intra-op Plan:   Post-operative Plan: Extubation in OR  Informed Consent: I have reviewed the patients History and Physical, chart, labs and discussed the procedure including the risks, benefits and alternatives for the proposed anesthesia with the patient or authorized representative who has indicated his/her understanding and acceptance.   Dental Advisory Given  Plan Discussed with: CRNA and Surgeon  Anesthesia Plan Comments:         Anesthesia Quick Evaluation

## 2012-04-25 ENCOUNTER — Telehealth: Payer: Self-pay | Admitting: *Deleted

## 2012-04-25 ENCOUNTER — Encounter (HOSPITAL_COMMUNITY): Payer: Self-pay | Admitting: Gynecology

## 2012-04-25 DIAGNOSIS — E039 Hypothyroidism, unspecified: Secondary | ICD-10-CM

## 2012-04-25 DIAGNOSIS — I10 Essential (primary) hypertension: Secondary | ICD-10-CM

## 2012-04-25 LAB — BASIC METABOLIC PANEL
GFR calc Af Amer: >90 mL/min (ref >90)
GFR calc non Af Amer: >90 mL/min (ref >90)
Glucose, Bld: 134 mg/dL — ABNORMAL HIGH (ref 70–99)
Potassium: 3.8 mEq/L (ref 3.5–5.1)
Sodium: 135 mEq/L (ref 135–145)

## 2012-04-25 LAB — CBC
Hemoglobin: 9.2 g/dL — ABNORMAL LOW (ref 12.0–15.0)
RBC: 3.43 MIL/uL — ABNORMAL LOW (ref 3.87–5.11)
WBC: 13.6 10*3/uL — ABNORMAL HIGH (ref 4.0–10.5)

## 2012-04-25 LAB — TSH: TSH: 0.634 u[IU]/mL (ref 0.350–4.500)

## 2012-04-25 MED ORDER — LEVOTHYROXINE SODIUM 25 MCG PO TABS
225.0000 ug | ORAL_TABLET | Freq: Every day | ORAL | Status: DC
  Administered 2012-04-26: 225 ug via ORAL
  Filled 2012-04-25: qty 1

## 2012-04-25 MED ORDER — METOCLOPRAMIDE HCL 10 MG PO TABS: 10.0000 mg | ORAL_TABLET | Freq: Four times a day (QID) | ORAL | Status: DC | PRN

## 2012-04-25 NOTE — Progress Notes (Addendum)
[facility] PROGRESS NOTE  Alvino Chapel ZOX:[id number] DOB: April 05, 1971 DOA: 04/24/2012 PCP: Tobin Chad, MD  Assessment/Plan: Active Problems:  HTN (hypertension)  Hypothyroidism    1. Uncontrolled HTN: Patient has a known history of HTN, and on admission, was on multiple antihypertensives, including HCTZ, Cardizem and ACE-i. Despite this, she had persistently elevated BP, post-operatively, although surgical pain may have been contributory. BP is now well controlled on Cardizem CD and HCTZ. We have discontinued Lisinopril. 2. Dysthyroidism: Patient is on thyroxine replacement therapy, and her Synthroid was increased about a week or two ago, from 125 mcg daily, to 225 mcg daily. She assures me that she has been compliant. It appears that TSH was markedly elevated at 131.302 on labs of 03/31/12, confirmed in EMR. In view of this , will continue current pre-admission Synthroid dose of 225 mcg daily. Further increase will likely be needed, but will defer this to her PMD/Endocrinologist, on discharge. Meanwhile, have ordered a re-check TSH today. Patient certainly needs an endocrinologist to manage such profound hypothyroidism, and we recommend that her PMD, arrange a referral. Have discussed all of the above, with Dr Lily Peer, primary OB-GYN. Repeat TSH in 2-4 weeks, is recommended.    Code Status: Full Code.  Family Communication:  Disposition Plan: Per primary team. Medical team has signed off today.    Brief narrative: 41 year old female, with known history of HTN, s/p previous CVA, hyperthyroidism/multi-nodular goiter, s/p RAI, now hypothyroid, anemia, impaired Glucose tolerance, hospitalized at [facility] 04/24/12, for pelvic pain, dysmenorrhea/menorrhagia, and is s/p abdominal hysterectomy on the same date. We were requested to assist in managing her uncontrolled HTN, peri-operatively    Consultants:  We are on consult.   Procedures: S/p abdominal hysterectomy on  04/24/12.   Antibiotics:  N/A  HPI/Subjective: Feels much bette today, adequately pain-controlled.   Objective: Vital signs in last 24 hours: Temp:  [98.2 F (36.8 C)-99.1 F (37.3 C)] 98.5 F (36.9 C) (08/23 0958) Pulse Rate:  [71-103] 72  (08/23 0958) Resp:  [15-24] 18  (08/23 0958) BP: (90-172)/(40-98) 104/52 mmHg (08/23 0958) SpO2:  [97 %-100 %] 100 % (08/23 0958) Weight:  [73.483 kg (162 lb)] 73.483 kg (162 lb) (08/22 1400) Weight change:  Last BM Date: 04/23/12  Intake/Output from previous day: 08/22 0701 - 08/23 0700 In: 3060 [P.O.:360; I.V.:2700] Out: 3635 [Urine:3485; Blood:150] Total I/O In: 360 [P.O.:360] Out: 500 [Urine:500]   Physical Exam: General: Comfortable, alert, communicative, fully oriented, not short of breath at rest.  HEENT: Mild clinical pallor, no jaundice, no conjunctival injection or discharge. Hydration status is fair.  NECK: Supple, JVP not seen, no carotid bruits, no palpable lymphadenopathy, no palpable goiter.  CHEST: Clinically clear to auscultation, no wheezes, no crackles.  HEART: Sounds 1 and 2 heard, normal, regular, no murmurs.  ABDOMEN: Full, soft, old surgical scars seen, mildly tender in lower abdomen, no palpable organomegaly, no palpable masses. Bowel sounds are heard. Dressings are dry.  GENITALIA: Not examined.  LOWER EXTREMITIES: No pitting edema, palpable peripheral pulses.  MUSCULOSKELETAL SYSTEM: Unremarkable.  CENTRAL NERVOUS SYSTEM: No focal neurologic deficit on gross examination.  Lab Results:  [facility] 04/25/12 0515  WBC 13.6*  HGB 9.2*  HCT 28.5*  PLT 124*    Basename 04/25/12 0515  NA 135  K 3.8  CL 100  CO2 26  GLUCOSE 134*  BUN 17  CREATININE 0.80  CALCIUM 8.8   Recent Results (from the past 240 hour(s))  SURGICAL PCR SCREEN  Status: Normal   Collection Time   04/17/12 11:05 AM      Component Value Range Status Comment   MRSA, PCR NEGATIVE  NEGATIVE Final    Staphylococcus aureus NEGATIVE   NEGATIVE Final      Studies/Results: No results found.  Medications: Scheduled Meds:   . ceFAZolin      . diltiazem  300 mg Oral Daily  . diltiazem  90 mg Oral TID  . enoxaparin  40 mg Subcutaneous Q24H  . hydrochlorothiazide  25 mg Oral Daily  . ketorolac  30 mg Intravenous Q6H  . labetalol  20 mg Intravenous Once  . levothyroxine  275 mcg Oral QAC breakfast  . lisinopril  40 mg Oral QHS  . promethazine      . scopolamine      . DISCONTD: hydrochlorothiazide  25 mg Oral BH-q7a  . DISCONTD: lisinopril  20 mg Oral QHS  . DISCONTD: morphine   Intravenous Q4H   Continuous Infusions:   . lactated ringers 125 mL/hr at 04/25/12 0811   PRN Meds:.hydrALAZINE, HYDROmorphone (DILAUDID) injection, metoCLOPramide, oxyCODONE-acetaminophen, DISCONTD: diphenhydrAMINE, DISCONTD: diphenhydrAMINE, DISCONTD: naloxone, DISCONTD: ondansetron (ZOFRAN) IV, DISCONTD: sodium chloride    LOS: 1 day   Alencia Gordon,CHRISTOPHER  [facility] Pager [phone]. If 8PM-8AM, please contact night-coverage at [email or web address], password [facility] 04/25/2012, 12:45 PM  LOS: 1 day

## 2012-04-25 NOTE — Telephone Encounter (Signed)
Per Dr.Fernandez verbal order pt will need endocrinology referral and [facility] PCP referral. Office notes faxed to [facility] office they will contact us with appointment time and date. Appt. With Dr.Javier Sharen Hones 06/17/12 @ 9:15 am. Pt will be placed on cancellation list as well.

## 2012-04-25 NOTE — Progress Notes (Signed)
Patient ID: [name], female   DOB: 1971-03-18, 41 y.o.   MRN: [id number]   Subjective: Patient reports incisional pain and no problems voiding.    Objective: I have reviewed patient's vital signs.  vital signs, intake and output, medications and labs.  Filed Vitals:   04/25/12 0546  BP: 105/47  Pulse: 71  Temp: 99 F (37.2 C)  Resp: 18   I/O last 3 completed shifts: In: 3060 [P.O.:360; I.V.:2700] Out: 3635 [Urine:3485; Blood:150] Total I/O In: -  Out: 150 [Urine:150]  Results for orders placed during the hospital encounter of 04/24/12 (from the past 24 hour(s))  BASIC METABOLIC PANEL     Status: Abnormal   Collection Time   04/25/12  5:15 AM      Component Value Range   Sodium 135  135 - 145 mEq/L   Potassium 3.8  3.5 - 5.1 mEq/L   Chloride 100  96 - 112 mEq/L   CO2 26  19 - 32 mEq/L   Glucose, Bld 134 (*) 70 - 99 mg/dL   BUN 17  6 - 23 mg/dL   Creatinine, Ser 4.09  0.50 - 1.10 mg/dL   Calcium 8.8  8.4 - 81.1 mg/dL   GFR calc non Af Amer >90  >90 mL/min   GFR calc Af Amer >90  >90 mL/min  CBC     Status: Abnormal   Collection Time   04/25/12  5:15 AM      Component Value Range   WBC 13.6 (*) 4.0 - 10.5 K/uL   RBC 3.43 (*) 3.87 - 5.11 MIL/uL   Hemoglobin 9.2 (*) 12.0 - 15.0 g/dL   HCT 91.4 (*) 78.2 - 95.6 %   MCV 83.1  78.0 - 100.0 fL   MCH 26.8  26.0 - 34.0 pg   MCHC 32.3  30.0 - 36.0 g/dL   RDW 21.3 (*) 08.6 - 57.8 %   Platelets 124 (*) 150 - 400 K/uL    EXAM General: alert Resp: clear to auscultation bilaterally Cardio: regular rate and rhythm, S1, S2 normal, no murmur, click, rub or gallop GI: soft, non-tender; bowel sounds normal; no masses,  no organomegaly Extremities: extremities normal, atraumatic, no cyanosis or edema Vaginal Bleeding: none  Incision sites intact  Assessment: s/p Procedure(s): HYSTERECTOMY TOTAL LAPAROSCOPIC HYSTERECTOMY ABDOMINAL: stable, progressing well and anemia and hypertension, and hypothyroidism. Dr. Ricke Hey  was consulted yesterday to assist with management of patient's hypertension and hypothyroidism. See previous note from  consultation yesterday. Patient will continue her Lovenox 40 mg subcutaneous every 24 hours due to her past history of CVA (cerebral infarct). Will await further recommendations from Dr.Oti on patient's hypertension and hypothyroidism management. Plan: Advance diet Encourage ambulation Advance to PO medication  LOS: 1 day    Ok Edwards, MD 04/25/2012 7:46 AM    04/25/2012, 7:46 AM

## 2012-04-25 NOTE — Progress Notes (Signed)
UR Chart review completed.  

## 2012-04-26 MED ORDER — METOCLOPRAMIDE HCL 10 MG PO TABS: 10.0000 mg | ORAL_TABLET | Freq: Four times a day (QID) | ORAL | Status: DC | PRN

## 2012-04-26 MED ORDER — HYDROCHLOROTHIAZIDE 25 MG PO TABS: 25.0000 mg | ORAL_TABLET | Freq: Every day | ORAL | Status: DC

## 2012-04-26 MED ORDER — OXYCODONE-ACETAMINOPHEN 5-325 MG PO TABS: 1.0000 | ORAL_TABLET | Freq: Four times a day (QID) | ORAL | Status: DC | PRN

## 2012-04-26 MED ORDER — DILTIAZEM HCL ER COATED BEADS 300 MG PO CP24: 300.0000 mg | ORAL_CAPSULE | Freq: Every day | ORAL | Status: DC

## 2012-04-26 NOTE — Plan of Care (Signed)
Problem: Phase II Progression Outcomes Goal: Remove staples if indicated/incision care Outcome: Not Applicable Date Met:  04/26/12 Will be removed in office

## 2012-04-26 NOTE — Discharge Summary (Signed)
Physician Discharge Summary  Patient ID: [name] MRN: [id number] DOB/AGE: October 11, 1970 41 y.o.  Admit date: 04/24/2012 Discharge date: 04/26/2012  Admission Diagnoses: menorrhagia, dysfunctional uterine bleeding,fibroid uterus,  Hypertension, hypothyroidism   Discharge Diagnoses:  Active Problems:  HTN (hypertension)  Hypothyroidism Anemia  Discharged Condition: good  Consults:Hospitalist Dr. Ricke Hey  Significant Diagnostic Studies: labs:TSH: 131.302, HgB 9.2 Treatments:anticoagulation: LMW heparin and surgery: TAH  Filed Vitals:   04/26/12 0600  BP: 136/76  Pulse: 84  Temp: 98.5 F (36.9 C)  Resp: 18         Hospital Course: Patient was admitted on August 22 for a scheduled total laparoscopic hysterectomy as a result of her symptomatic leiomyomatous uteri (dysmenorrhea, menorrhagia, and anemia). Due to patient's multiple abdominal surgeries and has an attempted total laparoscopic hysterectomy had to be converted to an open laparotomy to complete the total abdominal hysterectomy with lysis of abdominal pelvic adhesions. [facility] hospital as was consulted to assist with patient's labile hypertension since she was already on 3 antihypertensives agents preoperatively. Dr. Ricke Hey assistant and monitoring patient's hypertension during her postoperative course see previous notes in the epic chart for details. Also he assisted with patient's severe hypothyroidism and adjusted her medications accordingly. Patient did well intraoperatively. 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 The first 24 hours after her surgery her Foley catheter was discontinued and her diet was advanced to clear liquids to full regular diet. She was up ambulating voiding well. The morning  of discharge she had good bowel sounds her incision site was intact she had passed flatus and voided spontaneously and had showered. Her postop hemoglobin on day one was 9.2 g and preop was 11.7. Her serum electrolytes were normal as well. She was rated be discharged home and will followup next week to have her staples removed and to check her blood pressure readings. The recommended regimens for patient's hypertension will be the Cardizem CD 300 mg one tablet every 24 hours along with HCTZ 25 mg daily. For hypothyroidism she will continue on Synthroid that was recently adjusted to 225 mcg daily.    Discharge Exam: Patient alert oriented with mild incisional discomfort Lungs: Clear to auscultation Rogers or wheezes Heart: Regular rate and rhythm the murmurs or gallops Abdomen: Soft nontender no rebound no guarding positive bowel sounds 4 quadrants Pfannenstiel incision intact with staples Pelvic: Not done no vaginal bleeding reported Extremities: No cords or edema   Significant Diagnostic Studies: See above  Treatments: surgery: Total abdominal hysterectomy with lysis of pelvic abdominal adhesions  Disposition: 01-Home or Self Care  Discharge Orders    Future Appointments: Provider: Department: Dept Phone: Center:   06/17/2012 9:30 AM Eustaquio Boyden, MD [facility] [id number]     Future Orders Please Complete By Expires   Resume previous diet      Driving Restrictions      Comments:   No driving for one  weeks   Lifting restrictions      Comments:   No lifting for 6  weeks   Call MD for:  temperature >100.5      Call MD for:  redness, tenderness, or signs of infection (pain, swelling, bleeding, redness, odor or green/yellow discharge around incision site)      Call MD  for:  severe or increased pain, loss or decreased feeling  in affected limb(s)      Discharge instructions      Comments:   Call office Monday morning to set up appointment for Wednesday to  have staples removed [phone]  Dr. Lily Peer  Histerectoma abdominal Cuidados posteriores a la ciruga (Hysterectomy, Abdominal, Care After) Por favor lea detenidamente las siguientes indicaciones. Sgalas durante las prximas semanas. Estas indicaciones le proporcionan informacin general acerca de cmo deber cuidarse despus de la ciruga. El mdico podr darle instrucciones especficas. Aunque el tratamiento se ha planificado de acuerdo con las prcticas mdicas disponibles ms recientes, ocasionalmente pueden ocurrir complicaciones inevitables. Si tiene problemas o surgen preguntas luego de recibir el alta, por favor comunquese con su mdico. INSTRUCCIONES PARA EL CUIDADO DOMICILIARIO La curacin puede demorar algn tiempo. Puede sentir molestias, sensibilidad, hinchazn y hematomas en el sitio de la operacin, durante algunas semanas. Esto es normal y Scientist, clinical (histocompatibility and immunogenetics) a medida que pase el [place].  Slo tome medicamentos de Sales promotion account executive o prescriptos para Primary school teacher, las molestias o bajar la fiebre segn las indicaciones de su mdico.  No tome aspirina. Esta puede ocasionar hemorragias.  No conduzca mientras toma analgsicos.  Siga las indicaciones de su mdico con respecto a la dieta, la actividad fsica, a levantar objetos, conducir el automvil y para las actividades en general.  Reanude su dieta habitual, segn las indicaciones y permisos.  Descanse y duerma lo suficiente.  No utilice tampones, duchas vaginales ni tenga relaciones sexuales hasta que el profesional la autorice.  Cambie el vendaje tal como se le indic.  Tmese la [employer] por da. Antela.  El mdico podr indicarle que se duche y no se d baos de inmersin durante Armed forces technical officer.  No beba alcohol hasta que el mdico la autorice.  Si est constipada, tome un laxante suave, si el mdico la autoriza. Los lquidos y los alimentos que contengan salvado la ayudarn para el problema de la constipacin.  Trate de  que alguien la acompae en su casa durante una o [place], para ayudarla con los quehaceres domsticos.  Asegrese que usted y su familia comprenden todo acerca de su operacin y Research scientist (physical sciences).Marland Kitchen  No firme ningn documento legal hasta que se sienta completamente bien.  Cumpla con todas las visitas de control, segn le indique su mdico.  SOLICITE ATENCIN MDICA SI: Presenta enrojecimiento, hinchazn o aumento del dolor en la herida.  Aparece pus en la herida.  Advierte un olor ftido que proviene de la herida o del vendaje.  Siente dolor u observa enrojecimiento e hinchazn en el sitio de la va intravenosa.  La herida se abre (los bordes no estn unidos)  Se siente mareada o sufre un desmayo.  Siente dolor o tiene una hemorragia al [employer].  Tiene diarrea.  Presenta nuseas o vmitos.  Brett Fairy hemorragia vaginal anormal.  Aparece una erupcin cutnea.  Tiene alguna reaccin anormal o aparece una alergia por los medicamentos.  Necesita analgsicos ms fuertes para su dolor.  SOLICITE ATENCIN MDICA DE INMEDIATO SI: La temperatura se eleva por encima de 100.  Siente dolor abdominal.  Siente dolor en el pecho.  Le falta el aire.  Se desmaya.  Siente dolor, u observa hinchazn o enrojecimiento en la pierna.  Tiene una hemorragia vaginal abundante, con o sin cogulos.  Document Released: 03/03/2007 Document Re-Released: 02/07/2010 [facility] Patient Information 2011 [place], [place].   MEmacular degenerationNOW8/24/2013       Medication List  As  of 04/26/2012  7:39 AM   STOP taking these medications         ASPIR-LOW 81 MG EC tablet      diltiazem 90 MG tablet      lisinopril 20 MG tablet      prenatal vitamin w/FE, FA 27-1 MG Tabs         TAKE these medications         diltiazem 300 MG 24 hr capsule   Commonly known as: CARDIZEM CD   Take 1 capsule (300 mg total) by mouth daily.      enoxaparin 40 MG/0.4ML injection   Commonly known as: LOVENOX   Inject 0.4  mLs (40 mg total) into the skin daily. First dose 7 PM the night prior to surgery      ferrous sulfate 325 (65 FE) MG tablet   Take 325 mg by mouth 3 (three) times daily with meals.      hydrochlorothiazide 25 MG tablet   Commonly known as: HYDRODIURIL   Take 1 tablet (25 mg total) by mouth daily.      levothyroxine 200 MCG tablet   Commonly known as: SYNTHROID, LEVOTHROID   Take 0.5 tablets (100 mcg total) by mouth daily. Increased back to 200 mcg by Zachery Dauer, MD due to a very high TSH on the 100 mcg dose.      levothyroxine 25 MCG tablet   Commonly known as: SYNTHROID, LEVOTHROID   Take 1 tablet (25 mcg total) by mouth daily.      metoCLOPramide 10 MG tablet   Commonly known as: REGLAN   Take 1 tablet (10 mg total) by mouth every 6 (six) hours as needed.      oxyCODONE-acetaminophen 5-325 MG per tablet   Commonly known as: PERCOCET/ROXICET   Take 1-2 tablets by mouth every 6 (six) hours as needed.                SignedOk Edwards 04/26/2012, 7:39 AM

## 2012-04-26 NOTE — Progress Notes (Signed)
Pt discharged home with husband.  Condition stable.  Pt taken via wheelchair to lobby of main entrance where husband stated that pt's brother was on the way to pick them up.  No equipment for home ordered at discharge.

## 2012-04-28 ENCOUNTER — Encounter: Payer: Self-pay | Admitting: Gynecology

## 2012-04-28 NOTE — Telephone Encounter (Signed)
appointment with Dr.Balan on 05/06/12 @ 12:30 pm will give to blanca to inform pt.

## 2012-04-28 NOTE — Progress Notes (Signed)
Appt with Dr. Talmage Nap is scheduled for 05/06/12 at 12:30pm.  Patient's phone was disconnected so letter was mailed to her with the info. Copy is in her e-chart.

## 2012-04-29 ENCOUNTER — Ambulatory Visit: Payer: 59 | Admitting: Family Medicine

## 2012-04-30 ENCOUNTER — Ambulatory Visit (INDEPENDENT_AMBULATORY_CARE_PROVIDER_SITE_OTHER): Payer: 59 | Admitting: Gynecology

## 2012-04-30 VITALS — BP 126/86 | Temp 97.1°F

## 2012-04-30 DIAGNOSIS — R300 Dysuria: Secondary | ICD-10-CM

## 2012-04-30 DIAGNOSIS — Z9889 Other specified postprocedural states: Secondary | ICD-10-CM

## 2012-04-30 LAB — URINALYSIS W MICROSCOPIC + REFLEX CULTURE
Casts: NONE SEEN
Crystals: NONE SEEN
Leukocytes, UA: NEGATIVE
Nitrite: NEGATIVE
Specific Gravity, Urine: 1.02 (ref 1.005–1.030)
pH: 5.5 (ref 5.0–8.0)

## 2012-04-30 MED ORDER — OXYCODONE-ACETAMINOPHEN 5-325 MG PO TABS: 1.0000 | ORAL_TABLET | Freq: Four times a day (QID) | ORAL | Status: AC | PRN

## 2012-04-30 NOTE — Progress Notes (Addendum)
Patient is a 41 year old who presented to the office today to have her staples removed. She is status post attempted laparoscopic hysterectomy with conversion to abdominal hysterectomy along with pelvic adhesio lysis. Patient also with history of hypertension. During her hospitalization the Hospitalist had to be consulted to manage her labile hypertension. She also had severe hypothyroidism whereby her TSH had been as high as 130.  Patient doing well she did state that she has had diarrhea on and off for the past 2 or 3 days. I explained to her that it may be due to the high dose of Synthroid which she has recently been increased to which is 225 mcg daily. We are in the process of getting her seen by the endocrinologist. For her blood pressure which is well controlled now with Cardizem 300 mg daily along with HCTZ 25 mg daily. She was normotensive today. Patient stated she has a low-grade temperature the other day at home but was afebrile today.  Abdomen: Pfannenstiel incision with staples intact. Positive bowel sounds all 4 quadrants. Abdomen soft no rebound or guarding or any tenderness. Half of the staples were removed and she'll return back next week to remove the other half. She was instructed to stay on full liquid diet for the next 24 hours before advancing to a regular diet and we will contact her essentially make an appointment for her with the endocrinologist. She scheduled to followup with her internist within the next couple weeks as well. She will continue on her iron tablet for her anemia. Her urinalysis today had 7-10 rbc and rare bacteria culture pending.

## 2012-05-07 ENCOUNTER — Encounter: Payer: Self-pay | Admitting: Gynecology

## 2012-05-07 ENCOUNTER — Ambulatory Visit (HOSPITAL_COMMUNITY)
Admission: RE | Admit: 2012-05-07 | Discharge: 2012-05-07 | Disposition: A | Discharge status: Home / Self Care | Payer: 59 | Source: Ambulatory Visit | Attending: Gynecology | Admitting: Gynecology

## 2012-05-07 ENCOUNTER — Ambulatory Visit (INDEPENDENT_AMBULATORY_CARE_PROVIDER_SITE_OTHER): Payer: 59 | Admitting: Gynecology

## 2012-05-07 VITALS — BP 116/70

## 2012-05-07 DIAGNOSIS — R102 Pelvic and perineal pain unspecified side: Secondary | ICD-10-CM

## 2012-05-07 DIAGNOSIS — N949 Unspecified condition associated with female genital organs and menstrual cycle: Secondary | ICD-10-CM

## 2012-05-07 DIAGNOSIS — R109 Unspecified abdominal pain: Secondary | ICD-10-CM

## 2012-05-07 DIAGNOSIS — G8918 Other acute postprocedural pain: Secondary | ICD-10-CM

## 2012-05-07 DIAGNOSIS — R1032 Left lower quadrant pain: Secondary | ICD-10-CM | POA: Insufficient documentation

## 2012-05-07 DIAGNOSIS — R10A2 Flank pain, left side: Secondary | ICD-10-CM

## 2012-05-07 LAB — URINALYSIS W MICROSCOPIC + REFLEX CULTURE
Casts: NONE SEEN
Crystals: NONE SEEN
Glucose, UA: NEGATIVE mg/dL
Leukocytes, UA: NEGATIVE
Nitrite: NEGATIVE
Specific Gravity, Urine: 1.025 (ref 1.005–1.030)
pH: 5.5 (ref 5.0–8.0)

## 2012-05-07 LAB — CBC WITH DIFFERENTIAL/PLATELET
Eosinophils Absolute: 0.1 10*3/uL (ref 0.0–0.7)
Eosinophils Relative: 2 % (ref 0–5)
Hemoglobin: 11 g/dL — ABNORMAL LOW (ref 12.0–15.0)
Lymphocytes Relative: 22 % (ref 12–46)
Lymphs Abs: 1.8 10*3/uL (ref 0.7–4.0)
MCH: 26.1 pg (ref 26.0–34.0)
MCV: 82.9 fL (ref 78.0–100.0)
Monocytes Relative: 10 % (ref 3–12)
Neutrophils Relative %: 67 % (ref 43–77)
RBC: 4.22 MIL/uL (ref 3.87–5.11)
WBC: 8.1 10*3/uL (ref 4.0–10.5)

## 2012-05-07 LAB — BUN: BUN: 14 mg/dL (ref 6–23)

## 2012-05-07 MED ORDER — IOHEXOL 300 MG/ML  SOLN
100.0000 mL | Freq: Once | INTRAMUSCULAR | Status: AC | PRN
Start: 2012-05-07 — End: 2012-05-07
  Administered 2012-05-07: 100 mL via INTRAVENOUS

## 2012-05-07 NOTE — Patient Instructions (Signed)
Got yo [facility] for xray

## 2012-05-07 NOTE — Progress Notes (Signed)
Patient is a 41 year old who was seen in the office on August 28 to remove her staples after her recent surgery. She is status post attempted laparoscopic hysterectomy with conversion to abdominal hysterectomy along with pelvic adhesive lysis. Patient scheduled to return to the office today for first two-week postop visit. She had been seen a few days after her surgery to remove half of her staples. She stated she remove the remaining staples at home as there were bothering her. She stated that last night she started having left lower abdominal discomfort and left flank tenderness and she felt warmth but did not have a temperature reading. She denies any problem with urination or bowel movements and has passed gas. She denied any vaginal bleeding. Temperature 97.4.  Exam: Abdomen soft slight tender left lower quadrant but no rebound. Positive bowel sounds are noted. Pfannenstiel incision was intact. Some edematous tissue was noted in the left lateral aspect of the incision. She did have some mild left flank tenderness.  Pelvic: Bartholin urethra Skene was within normal limits Vagina: Vaginal cuff intact Bimanual exam: No discernible mass behind the vaginal cuff some tenderness on the left lower quadrant but I could not palpate a mass per say.  Operative note from her recent surgery was reviewed there was no complication from her surgery. [facility] CT of the abdomen and pelvis with oral IV contrast delayed imaging through the ureters for assessment of the urinary tract. Her urinalysis today demonstrated 0-2 WBC 3-6 rbc's few bacteria and will be sent for culture.  CBC: White blood count 8.1, hemoglobin 11.0, hematocrit 35.0  BUN: 14 (normal 6-23)  Creatinine: 0.86 (normal 0.50-21.10)  Result of CT of abdomen and pelvis:IMPRESSION:  No acute findings in the abdomen or pelvis. Specifically, no  findings to explain the patient's history of left lower  quadrant  pain. No colonic diverticulitis. No pelvic abscess. No adnexal  Mass.  Assessment/plan: Patient was reassured she can continue to take her pain medication as previously prescribed and to increase her ambulation and we'll see her in 2 weeks for postop visit. Urine culture pending at time of this dictation.

## 2012-05-09 LAB — URINE CULTURE: Organism ID, Bacteria: NO GROWTH

## 2012-05-12 ENCOUNTER — Telehealth: Payer: Self-pay | Admitting: Anesthesiology

## 2012-05-12 NOTE — Telephone Encounter (Signed)
Please call in Lortab 7.5/500 to take 1 tablet every 4-6 hours when necessary pain. #30

## 2012-05-12 NOTE — Telephone Encounter (Signed)
Patient called requesting a rx for pain. Continues to have the same LLQ pain that radiates to the back ... . Patient says tylenol is not helping.. Please advise.Marland KitchenMarland Kitchen

## 2012-05-12 NOTE — Telephone Encounter (Signed)
Patient has been informed and rx was called in to [facility].Marland KitchenMarland Kitchen

## 2012-06-17 ENCOUNTER — Encounter: Payer: Self-pay | Admitting: Family Medicine

## 2012-06-17 ENCOUNTER — Ambulatory Visit (INDEPENDENT_AMBULATORY_CARE_PROVIDER_SITE_OTHER): Payer: 59 | Admitting: Family Medicine

## 2012-06-17 VITALS — BP 168/108 | HR 88 | Temp 98.1°F | Ht 62.0 in | Wt 159.8 lb

## 2012-06-17 DIAGNOSIS — R209 Unspecified disturbances of skin sensation: Secondary | ICD-10-CM

## 2012-06-17 DIAGNOSIS — R202 Paresthesia of skin: Secondary | ICD-10-CM

## 2012-06-17 DIAGNOSIS — I10 Essential (primary) hypertension: Secondary | ICD-10-CM

## 2012-06-17 DIAGNOSIS — R200 Anesthesia of skin: Secondary | ICD-10-CM | POA: Insufficient documentation

## 2012-06-17 DIAGNOSIS — E039 Hypothyroidism, unspecified: Secondary | ICD-10-CM

## 2012-06-17 DIAGNOSIS — Z23 Encounter for immunization: Secondary | ICD-10-CM

## 2012-06-17 MED ORDER — LISINOPRIL 5 MG PO TABS: 5.0000 mg | ORAL_TABLET | Freq: Every day | ORAL | Status: DC

## 2012-06-17 NOTE — Assessment & Plan Note (Signed)
Uncontrolled. Start low dose lisinopril. Creat stable last month.   rtc 1 wk for labwork, 91mo for f/u OV. Hopeful to titrate off dilt and onto less frequent med, consider lisinopril/hctz combo. S/p hysterectomy

## 2012-06-17 NOTE — Assessment & Plan Note (Signed)
Continue levothyroxine.

## 2012-06-17 NOTE — Assessment & Plan Note (Signed)
Treat as possible CTS with wrist brace at night, will reassess next visit.

## 2012-06-17 NOTE — Progress Notes (Signed)
Subjective:    Patient ID: [name], female    DOB: 04-02-1971, 41 y.o.   MRN: [id number]  HPI CC: new pt to establish  HTN - chronic condition, currently uncontrolled.  compliant with HCTZ 25mg  daily and diltiazem 90mg  tid.    Hand numbness worse at night - right hand.  sxs going on for last few weeks.  No hand pain.  Worse since has gained weight.  Hypothyroidism - chronic. On levothyroxine daily.  Wt Readings from Last 3 Encounters:  06/17/12 159 lb 12 oz (72.462 kg)  04/24/12 162 lb (73.483 kg)  04/24/12 162 lb (73.483 kg)    BP Readings from Last 3 Encounters:  06/17/12 164/110  05/07/12 116/70  04/30/12 126/86    Preventative: Would like flu shot today. Last CPE was 5-6 yrs ago. Pap smear - 04/2012, normal. S/p hysterectomy for heavy bleeding, ovaries remain.   Lab Results  Component Value Date   WBC 8.1 05/07/2012   HGB 11.0* 05/07/2012   HCT 35.0* 05/07/2012   MCV 82.9 05/07/2012   PLT 230 05/07/2012    Medications and allergies reviewed and updated in chart.  Past histories reviewed and updated if relevant as below. Patient Active Problem List  Diagnosis  . HYPOTHYROIDISM, UNSPECIFIED  . HYPERLIPIDEMIA  . Overweight (BMI 25.0-29.9)  . MIGRAINE HEADACHE  . HYPERTENSION, BENIGN SYSTEMIC  . OCCLUSION, CEREBRAL ARTERY NOS W/INFARCTION  . ASTHMA, PERSISTENT  . UMBILICAL HERNIA  . MENSTRUAL CYCLE, IRREGULAR  . SYMPTOM, DISTURBANCE OF SKIN SENSATION  . Anemia  . HTN (hypertension)  . Hypothyroidism  . Post-operative pain  . Left flank pain  . Female pelvic pain   Past Medical History  Diagnosis Date  . Hyperthyroidism 10/2000    RAI treatment,  multinodular goiter  . Ectopic pregnancy 04/2006    treated with medication  . Hepatic steatosis 07/2006    on Abd CT done for pain  . 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 Preterm delivery 12/2010    x3, placental abruption, maternal htn, DM  . Hypertension   . Anemia     . ABORTION, SPONTANEOUS 08/18/2007  . IMPAIRED GLUCOSE TOLERANCE 08/18/2007  . Hypothyroidism   . Asthma     per pt, not on prior records   Past Surgical History  Procedure Date  . Appendectomy 1997  . Cholecystectomy 1997  . Ovarian cyst removal   . Laparoscopic lysis intestinal adhesions   . Tubal ligation   . Thyroid surgery     goiter removed  . Cesarean section     x 1  . Laparoscopic hysterectomy 04/24/2012    Procedure: HYSTERECTOMY TOTAL LAPAROSCOPIC;  Surgeon: Ok Edwards, MD;  Location: [facility];  Service: Gynecology;  Laterality: N/A;  2 1/2 hours OR time.  Dr. Audie Box to assisting  Patient is Spanish speaking.  Thanks!!  . Abdominal hysterectomy 04/24/2012    Procedure: HYSTERECTOMY ABDOMINAL;  Surgeon: Ok Edwards, MD;  Location: [facility];  Service: Gynecology;;   History  Substance Use Topics  . Smoking status: Never Smoker   . Smokeless tobacco: Never Used  . Alcohol Use: No   Family History  Problem Relation Age of Onset  . Hypertension Father   . Stroke Father     hemorrhagic, deceased  . Diabetes Mother   . Coronary artery disease Neg Hx   . Cancer Neg Hx    No Known Allergies Current Outpatient Prescriptions on File  Prior to Visit  Medication Sig Dispense Refill  . ferrous sulfate 325 (65 FE) MG tablet Take 325 mg by mouth 3 (three) times daily with meals.       . hydrochlorothiazide (HYDRODIURIL) 25 MG tablet Take 1 tablet (25 mg total) by mouth daily.  30 tablet  11  . levothyroxine (LEVOTHROID) 25 MCG tablet Take 1 tablet (25 mcg total) by mouth daily.  30 tablet  11   Review of Systems  Constitutional: Positive for unexpected weight change (weight gain). Negative for fever, chills, activity change, appetite change and fatigue.  HENT: Negative for hearing loss and neck pain.   Eyes: Negative for visual disturbance.  Respiratory: Negative for cough, chest tightness, shortness of breath and wheezing.   Cardiovascular: Negative for chest  pain, palpitations and leg swelling.  Gastrointestinal: Positive for abdominal pain and diarrhea. Negative for nausea, vomiting, constipation, blood in stool and abdominal distention.  Genitourinary: Negative for hematuria and difficulty urinating.  Musculoskeletal: Negative for myalgias and arthralgias.  Skin: Negative for rash.  Neurological: Positive for headaches. Negative for dizziness, seizures and syncope.  Hematological: Does not bruise/bleed easily.  Psychiatric/Behavioral: Negative for dysphoric mood. The patient is not nervous/anxious.        Objective:   Physical Exam  Nursing note and vitals reviewed. Constitutional: She is oriented to person, place, and time. She appears well-developed and well-nourished. No distress.  HENT:  Head: Normocephalic and atraumatic.  Right Ear: Hearing, tympanic membrane, external ear and ear canal normal.  Left Ear: Hearing, tympanic membrane, external ear and ear canal normal.  Nose: Nose normal.  Mouth/Throat: Oropharynx is clear and moist. No oropharyngeal exudate.  Eyes: Conjunctivae normal and EOM are normal. Pupils are equal, round, and reactive to light. No scleral icterus.  Neck: Normal range of motion. Neck supple. No thyromegaly present.  Cardiovascular: Normal rate, regular rhythm, normal heart sounds and intact distal pulses.   No murmur heard. Pulses:      Radial pulses are 2+ on the right side, and 2+ on the left side.  Pulmonary/Chest: Effort normal and breath sounds normal. No respiratory distress. She has no wheezes. She has no rales.  Abdominal: Soft. Bowel sounds are normal. She exhibits no distension and no mass. There is no tenderness. There is no rebound and no guarding.  Musculoskeletal: Normal range of motion. She exhibits no edema.       No atrophy noted of BUE  Lymphadenopathy:    She has no cervical adenopathy.  Neurological: She is alert and oriented to person, place, and time. She has normal strength. She  displays no atrophy. No sensory deficit. She exhibits normal muscle tone. She displays a negative Romberg sign.  Reflex Scores:      Bicep reflexes are 2+ on the right side and 2+ on the left side.      Brachioradialis reflexes are 2+ on the right side and 2+ on the left side.      CN grossly intact, station and gait intact Neg tinel, phalen 5/5 strength BUE Temperature sensation intact  Skin: Skin is warm and dry. No rash noted.  Psychiatric: She has a normal mood and affect. Her behavior is normal. Judgment and thought content normal.      Assessment & Plan:

## 2012-06-17 NOTE — Assessment & Plan Note (Deleted)
Uncontrolled. Start low dose lisinopril. Creat stable last month.   rtc 1 wk for labwork, 1mo for f/u OV. Hopeful to titrate off dilt and onto less frequent med, consider lisinopril/hctz combo. S/p hysterectomy 

## 2012-06-17 NOTE — Patient Instructions (Signed)
Flu shot today. empieze medicina para presion llamada lisinopril 10mg  diaria - la mande a [employer]textile technologist. regrese aqui en 1 semana para labwork - chequear rinones. trate brace cada noche para muneca por posible sindrome de tunel carpal. Return in 1 wk for labwork. Return in 1 mo for f/u blood pressure.

## 2012-07-06 ENCOUNTER — Encounter (HOSPITAL_COMMUNITY): Payer: Self-pay | Admitting: *Deleted

## 2012-07-06 ENCOUNTER — Emergency Department (HOSPITAL_COMMUNITY)
Admission: EM | Admit: 2012-07-06 | Discharge: 2012-07-06 | Disposition: A | Discharge status: Home / Self Care | Payer: 59 | Source: Home / Self Care | Attending: Emergency Medicine | Admitting: Emergency Medicine

## 2012-07-06 DIAGNOSIS — M549 Dorsalgia, unspecified: Secondary | ICD-10-CM

## 2012-07-06 LAB — POCT URINALYSIS DIP (DEVICE)
Bilirubin Urine: NEGATIVE
Glucose, UA: NEGATIVE mg/dL
Hgb urine dipstick: NEGATIVE
Nitrite: NEGATIVE

## 2012-07-06 MED ORDER — METHYLPREDNISOLONE 4 MG PO KIT: PACK | ORAL | Status: DC

## 2012-07-06 MED ORDER — KETOROLAC TROMETHAMINE 60 MG/2ML IM SOLN
INTRAMUSCULAR | Status: AC
  Filled 2012-07-06: qty 2

## 2012-07-06 MED ORDER — MELOXICAM 7.5 MG PO TABS: 7.5000 mg | ORAL_TABLET | Freq: Every day | ORAL | Status: DC

## 2012-07-06 MED ORDER — TRAMADOL HCL 50 MG PO TABS: 50.0000 mg | ORAL_TABLET | Freq: Four times a day (QID) | ORAL | Status: DC | PRN

## 2012-07-06 MED ORDER — CYCLOBENZAPRINE HCL 10 MG PO TABS: 10.0000 mg | ORAL_TABLET | Freq: Two times a day (BID) | ORAL | Status: DC | PRN

## 2012-07-06 MED ORDER — KETOROLAC TROMETHAMINE 60 MG/2ML IM SOLN
60.0000 mg | Freq: Once | INTRAMUSCULAR | Status: AC
  Administered 2012-07-06: 60 mg via INTRAMUSCULAR

## 2012-07-06 NOTE — ED Notes (Signed)
Pt  Reports  Low  Back  Pain  For  2  Days  denys  specefic  Injury     -  denys  Any urinary  Symptoms         Pain  Worse  On movement and  posistion         Sitting  Upright on  Exam table  Speaking in  Complete  Sentances

## 2012-07-06 NOTE — ED Provider Notes (Signed)
History     CSN: [id number]  Arrival date & time 07/06/12  1739   First MD Initiated Contact with Patient 07/06/12 1835      Chief Complaint  Patient presents with  . Back Pain    (Consider location/radiation/quality/duration/timing/severity/associated sxs/prior treatment) Patient is a 41 y.o. female presenting with back pain. The history is provided by the patient.  Back Pain  This is a new problem. The current episode started more than 2 days ago. The problem occurs constantly. The pain is associated with no known injury. The quality of the pain is described as stabbing. The pain does not radiate. The pain is at a severity of 10/10. The symptoms are aggravated by bending, twisting and certain positions. The pain is the same all the time. Stiffness is present in the morning. Pertinent negatives include no fever, no numbness, no abdominal pain, no bowel incontinence, no perianal numbness, no dysuria, no paresthesias, no tingling and no weakness. She has tried NSAIDs for the symptoms. The treatment provided no relief. Risk factors include lack of exercise.  currently unemployed, takes care of daughter at home.    Past Medical History  Diagnosis Date  . Hyperthyroidism 10/2000    RAI treatment,  multinodular goiter  . Ectopic pregnancy 04/2006    treated with medication  . Hepatic steatosis 07/2006    on Abd CT done for pain  . 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 Preterm delivery 12/2010    x3, placental abruption, maternal htn, DM  . Hypertension   . Anemia   . ABORTION, SPONTANEOUS 08/18/2007  . IMPAIRED GLUCOSE TOLERANCE 08/18/2007  . Hypothyroidism   . Asthma     Past Surgical History  Procedure Date  . Appendectomy 1997  . Cholecystectomy 1997  . Ovarian cyst removal   . Laparoscopic lysis intestinal adhesions   . Tubal ligation   . Thyroid surgery     goiter removed  . Cesarean section     x 1  . Laparoscopic hysterectomy  04/24/2012    Procedure: HYSTERECTOMY TOTAL LAPAROSCOPIC;  Surgeon: Ok Edwards, MD;  Location: [facility];  Service: Gynecology;  Laterality: N/A;  2 1/2 hours OR time.  Dr. Audie Box to assisting  Patient is Spanish speaking.  Thanks!!  . Abdominal hysterectomy 04/24/2012    Procedure: HYSTERECTOMY ABDOMINAL;  Surgeon: Ok Edwards, MD;  Location: [facility];  Service: Gynecology;;    Family History  Problem Relation Age of Onset  . Hypertension Father   . Stroke Father     hemorrhagic, deceased  . Diabetes Mother   . Coronary artery disease Neg Hx   . Cancer Neg Hx     History  Substance Use Topics  . Smoking status: Never Smoker   . Smokeless tobacco: Never Used  . Alcohol Use: No    OB History    Grav Para Term Preterm Abortions TAB SAB Ect Mult Living   5 1  1 3  3   1       Review of Systems  Constitutional: Negative for fever.  Gastrointestinal: Negative for abdominal pain and bowel incontinence.  Genitourinary: Negative for dysuria.  Musculoskeletal: Positive for back pain.  Neurological: Negative for tingling, weakness, numbness and paresthesias.  All other systems reviewed and are negative.    Allergies  Review of patient's allergies indicates no known allergies.  Home Medications   Current Outpatient Rx  Name  Route  Sig  Dispense  Refill  . CYCLOBENZAPRINE HCL 10 MG PO TABS   Oral   Take 1 tablet (10 mg total) by mouth 2 (two) times daily as needed for muscle spasms.   30 tablet   0   . DILTIAZEM HCL 90 MG PO TABS   Oral   Take 90 mg by mouth 3 (three) times daily.          Marland Kitchen FERROUS SULFATE 325 (65 FE) MG PO TABS   Oral   Take 325 mg by mouth 3 (three) times daily with meals.          Marland Kitchen HYDROCHLOROTHIAZIDE 25 MG PO TABS   Oral   Take 1 tablet (25 mg total) by mouth daily.   30 tablet   11   . LEVOTHYROXINE SODIUM 25 MCG PO TABS   Oral   Take 1 tablet (25 mcg total) by mouth daily.   30 tablet   11   . LEVOTHYROXINE SODIUM 200  MCG PO TABS   Oral   Take 200 mcg by mouth daily.         Marland Kitchen LISINOPRIL 5 MG PO TABS   Oral   Take 1 tablet (5 mg total) by mouth daily.   30 tablet   3   . MELOXICAM 7.5 MG PO TABS   Oral   Take 1 tablet (7.5 mg total) by mouth daily.   30 tablet   0   . METHYLPREDNISOLONE 4 MG PO KIT      follow package directions   21 tablet   0   . TRAMADOL HCL 50 MG PO TABS   Oral   Take 1 tablet (50 mg total) by mouth every 6 (six) hours as needed for pain.   15 tablet   0     BP 151/99  Pulse 89  Temp 98.6 F (37 C) (Oral)  Resp 16  SpO2 100%  LMP 11/12/2011  Physical Exam  Nursing note and vitals reviewed. Constitutional: She is oriented to person, place, and time. Vital signs are normal. She appears well-developed and well-nourished. She is active and cooperative.  HENT:  Head: Normocephalic.  Eyes: Conjunctivae normal are normal. Pupils are equal, round, and reactive to light. No scleral icterus.  Neck: Trachea normal. Neck supple.  Cardiovascular: Normal rate and regular rhythm.   Pulmonary/Chest: Effort normal and breath sounds normal.  Musculoskeletal:       Cervical back: Normal.       Thoracic back: She exhibits tenderness and spasm. She exhibits normal range of motion, no bony tenderness, no swelling, no edema, no deformity, no laceration and no pain.       Back:  Neurological: She is alert and oriented to person, place, and time. She has normal strength and normal reflexes. No cranial nerve deficit or sensory deficit. Coordination and gait normal. GCS eye subscore is 4. GCS verbal subscore is 5. GCS motor subscore is 6.  Skin: Skin is warm and dry.  Psychiatric: She has a normal mood and affect. Her speech is normal and behavior is normal. Judgment and thought content normal. Cognition and memory are normal.    ED Course  Procedures (including critical care time)   Labs Reviewed  POCT URINALYSIS DIP (DEVICE)  LAB REPORT - SCANNED   Urine dip  negative.  1. Back pain       MDM  toradol 60mg  administered in office, mild improvement in pain.  Heat therapy, medications as prescribed.  Follow up with primary  care provider if pain is not improved.         Johnsie Kindred, NP 07/07/12 1639

## 2012-07-07 ENCOUNTER — Telehealth: Payer: Self-pay

## 2012-07-07 ENCOUNTER — Encounter: Payer: Self-pay | Admitting: Family Medicine

## 2012-07-07 ENCOUNTER — Ambulatory Visit (INDEPENDENT_AMBULATORY_CARE_PROVIDER_SITE_OTHER): Payer: 59 | Admitting: Family Medicine

## 2012-07-07 ENCOUNTER — Ambulatory Visit (INDEPENDENT_AMBULATORY_CARE_PROVIDER_SITE_OTHER)
Admission: RE | Admit: 2012-07-07 | Discharge: 2012-07-07 | Disposition: A | Discharge status: Home / Self Care | Payer: 59 | Source: Ambulatory Visit | Attending: Family Medicine | Admitting: Family Medicine

## 2012-07-07 VITALS — BP 126/80 | HR 86 | Temp 97.8°F | Ht 62.0 in | Wt 161.8 lb

## 2012-07-07 DIAGNOSIS — M545 Low back pain: Secondary | ICD-10-CM

## 2012-07-07 NOTE — Progress Notes (Signed)
Subjective:    Patient ID: [name], female    DOB: Dec 16, 1970, 41 y.o.   MRN: [id number]  HPI Seen at urgent care yesterday for back spasm   Medrol dosepack , flexeril , tramadol , and also taking meloxicam  Did not do any xrays   Today is not better at all  Low back pain - all the way across  No radiation to legs or anywhere  No numbness or weakness   Has not tried heat or ice   Any kind of movement hurts -any direction at all  Has been sitting since yesterday  Is better if she stays still  Patient Active Problem List  Diagnosis  . HYPOTHYROIDISM, UNSPECIFIED  . HYPERLIPIDEMIA  . Overweight (BMI 25.0-29.9)  . MIGRAINE HEADACHE  . HYPERTENSION, BENIGN SYSTEMIC  . OCCLUSION, CEREBRAL ARTERY NOS W/INFARCTION  . ASTHMA, PERSISTENT  . UMBILICAL HERNIA  . MENSTRUAL CYCLE, IRREGULAR  . SYMPTOM, DISTURBANCE OF SKIN SENSATION  . Anemia  . Post-operative pain  . Left flank pain  . Female pelvic pain  . Numbness and tingling in right hand   Past Medical History  Diagnosis Date  . Hyperthyroidism 10/2000    RAI treatment,  multinodular goiter  . Ectopic pregnancy 04/2006    treated with medication  . Hepatic steatosis 07/2006    on Abd CT done for pain  . 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 Preterm delivery 12/2010    x3, placental abruption, maternal htn, DM  . Hypertension   . Anemia   . ABORTION, SPONTANEOUS 08/18/2007  . IMPAIRED GLUCOSE TOLERANCE 08/18/2007  . Hypothyroidism   . Asthma    Past Surgical History  Procedure Date  . Appendectomy 1997  . Cholecystectomy 1997  . Ovarian cyst removal   . Laparoscopic lysis intestinal adhesions   . Tubal ligation   . Thyroid surgery     goiter removed  . Cesarean section     x 1  . Laparoscopic hysterectomy 04/24/2012    Procedure: HYSTERECTOMY TOTAL LAPAROSCOPIC;  Surgeon: Ok Edwards, MD;  Location: [facility];  Service: Gynecology;  Laterality: N/A;  2 1/2 hours OR time.   Dr. Audie Box to assisting  Patient is Spanish speaking.  Thanks!!  . Abdominal hysterectomy 04/24/2012    Procedure: HYSTERECTOMY ABDOMINAL;  Surgeon: Ok Edwards, MD;  Location: [facility];  Service: Gynecology;;   History  Substance Use Topics  . Smoking status: Never Smoker   . Smokeless tobacco: Never Used  . Alcohol Use: No   Family History  Problem Relation Age of Onset  . Hypertension Father   . Stroke Father     hemorrhagic, deceased  . Diabetes Mother   . Coronary artery disease Neg Hx   . Cancer Neg Hx    No Known Allergies Current Outpatient Prescriptions on File Prior to Visit  Medication Sig Dispense Refill  . cyclobenzaprine (FLEXERIL) 10 MG tablet Take 1 tablet (10 mg total) by mouth 2 (two) times daily as needed for muscle spasms.  30 tablet  0  . diltiazem (CARDIZEM) 90 MG tablet Take 90 mg by mouth 3 (three) times daily.       . ferrous sulfate 325 (65 FE) MG tablet Take 325 mg by mouth 3 (three) times daily with meals.       . hydrochlorothiazide (HYDRODIURIL) 25 MG tablet Take 1 tablet (25 mg total) by mouth daily.  30 tablet  11  .  levothyroxine (LEVOTHROID) 25 MCG tablet Take 1 tablet (25 mcg total) by mouth daily.  30 tablet  11  . levothyroxine (SYNTHROID, LEVOTHROID) 200 MCG tablet Take 200 mcg by mouth daily.      Marland Kitchen lisinopril (PRINIVIL,ZESTRIL) 5 MG tablet Take 1 tablet (5 mg total) by mouth daily.  30 tablet  3  . meloxicam (MOBIC) 7.5 MG tablet Take 1 tablet (7.5 mg total) by mouth daily.  30 tablet  0  . methylPREDNISolone (MEDROL DOSEPAK) 4 MG tablet follow package directions  21 tablet  0  . traMADol (ULTRAM) 50 MG tablet Take 1 tablet (50 mg total) by mouth every 6 (six) hours as needed for pain.  15 tablet  0   Current Facility-Administered Medications on File Prior to Visit  Medication Dose Route Frequency Provider Last Rate Last Dose  . [COMPLETED] ketorolac (TORADOL) injection 60 mg  60 mg Intramuscular Once Johnsie Kindred, NP   60 mg at  07/06/12 1854      Review of Systems Review of Systems  Constitutional: Negative for fever, appetite change, fatigue and unexpected weight change.  Eyes: Negative for pain and visual disturbance.  Respiratory: Negative for cough and shortness of breath.   Cardiovascular: Negative for cp or palpitations    Gastrointestinal: Negative for nausea, diarrhea and constipation.  Genitourinary: Negative for urgency and frequency.  Skin: Negative for pallor or rash   MSK pos for low back pain worse with movement/ neg for radiation of pain/ neg for swollen joints  Neurological: Negative for weakness, light-headedness, numbness and headaches.  Hematological: Negative for adenopathy. Does not bruise/bleed easily.  Psychiatric/Behavioral: Negative for dysphoric mood. The patient is not nervous/anxious.         Objective:   Physical Exam  Constitutional: She appears well-developed and well-nourished. No distress.  HENT:  Head: Normocephalic and atraumatic.  Eyes: Conjunctivae normal and EOM are normal.  Neck: Normal range of motion. Neck supple. No thyromegaly present.  Cardiovascular: Normal rate and regular rhythm.   Musculoskeletal: She exhibits tenderness. She exhibits no edema.       Lumbar back: She exhibits decreased range of motion, tenderness, pain and spasm. She exhibits no bony tenderness, no swelling, no edema, no deformity and normal pulse.       Limited rom LS flex and ext - less than 10 deg Negative bent knee raise for leg pain  Tender in peri lumbar musculature bilaterally, worse on the L No spinous process tenderness  Lymphadenopathy:    She has no cervical adenopathy.  Neurological: She is alert. She has normal strength and normal reflexes. She displays no atrophy. No sensory deficit. She exhibits normal muscle tone.  Skin: Skin is warm and dry. No rash noted. No erythema. No pallor.  Psychiatric: She has a normal mood and affect.          Assessment & Plan:

## 2012-07-07 NOTE — Telephone Encounter (Signed)
She was seen  

## 2012-07-07 NOTE — Patient Instructions (Addendum)
Continue current medicines- it can take a while for them to start working Try some heat on low back 10 minutes on / 10 minutes off We will do an x ray today - and I will have a reading in the morning  If xray is ok we will refer you to physical therapy to help with the spasms

## 2012-07-07 NOTE — ED Provider Notes (Signed)
Medical screening examination/treatment/procedure(s) were performed by non-physician practitioner and as supervising physician I was immediately available for consultation/collaboration.  Raynald Blend, MD 07/07/12 1654

## 2012-07-07 NOTE — Telephone Encounter (Signed)
Pt seen [facility] 07/06/12 given med for pain; not helping; dull pain across back continuously; pain level now 10.No none injury. Pt had similar pain 1 1/2 months ago but not this bad.pt to see Dr Milinda Antis today at 3pm.

## 2012-07-07 NOTE — Assessment & Plan Note (Signed)
Recurrent low back pain - seen in UC yesterday, not improving with meds so far / mobility is impaired Suspect spasm- no neuol red flags Xray today  Continue current meds incl medrol dose pack and flexeril Also try heat  Handout given  If nl xray will ref to PT

## 2012-07-08 ENCOUNTER — Telehealth: Payer: Self-pay | Admitting: Family Medicine

## 2012-07-08 DIAGNOSIS — M545 Low back pain: Secondary | ICD-10-CM

## 2012-07-08 NOTE — Telephone Encounter (Signed)
Ref for pt

## 2012-07-08 NOTE — Telephone Encounter (Signed)
Message copied by Judy Pimple on Tue Jul 08, 2012 12:25 PM ------      Message from: Blenda Mounts M      Created: Tue Jul 08, 2012 11:12 AM       Pt notified and agrees to PT

## 2012-11-26 ENCOUNTER — Encounter: Payer: Self-pay | Admitting: Family Medicine

## 2012-11-26 ENCOUNTER — Ambulatory Visit (INDEPENDENT_AMBULATORY_CARE_PROVIDER_SITE_OTHER): Payer: 59 | Admitting: Family Medicine

## 2012-11-26 VITALS — BP 180/100 | HR 96 | Temp 98.0°F | Wt 162.0 lb

## 2012-11-26 DIAGNOSIS — G43909 Migraine, unspecified, not intractable, without status migrainosus: Secondary | ICD-10-CM

## 2012-11-26 DIAGNOSIS — I10 Essential (primary) hypertension: Secondary | ICD-10-CM

## 2012-11-26 DIAGNOSIS — E039 Hypothyroidism, unspecified: Secondary | ICD-10-CM

## 2012-11-26 MED ORDER — LISINOPRIL 5 MG PO TABS: 5.0000 mg | ORAL_TABLET | Freq: Every day | ORAL | Status: DC

## 2012-11-26 MED ORDER — CYCLOBENZAPRINE HCL 10 MG PO TABS: 10.0000 mg | ORAL_TABLET | Freq: Three times a day (TID) | ORAL | Status: DC | PRN

## 2012-11-26 MED ORDER — LEVOTHYROXINE SODIUM 200 MCG PO TABS: 200.0000 ug | ORAL_TABLET | Freq: Every day | ORAL | Status: DC

## 2012-11-26 MED ORDER — DILTIAZEM HCL 90 MG PO TABS: 90.0000 mg | ORAL_TABLET | Freq: Two times a day (BID) | ORAL | Status: DC

## 2012-11-26 MED ORDER — PROMETHAZINE HCL 25 MG PO TABS: 25.0000 mg | ORAL_TABLET | Freq: Three times a day (TID) | ORAL | Status: DC | PRN

## 2012-11-26 MED ORDER — LEVOTHYROXINE SODIUM 25 MCG PO TABS: 25.0000 ug | ORAL_TABLET | Freq: Every day | ORAL | Status: DC

## 2012-11-26 MED ORDER — IBUPROFEN 600 MG PO TABS: 600.0000 mg | ORAL_TABLET | Freq: Three times a day (TID) | ORAL | Status: DC | PRN

## 2012-11-26 NOTE — Patient Instructions (Addendum)
Change to lisinopril 5mg  a day (previously 10mg  a day). Keep a record of your blood pressure at home and bring it back to see Dr. Sharen Hones in about 10 days.  Schedule a follow up appointment.   Take cyclobenzaprine for the pain and promethazine for the nausea.  Both can make you drowsy.  You can take ibuprofen with food up to 3 times a day for the pain.

## 2012-11-26 NOTE — Assessment & Plan Note (Signed)
I am unsure of white coat effect.  Would restart lisinopril at 5mg  a day and have her keep BP log, then f/u with PCP.  She agrees.

## 2012-11-26 NOTE — Assessment & Plan Note (Signed)
Refilled meds, can f/u with PCP.

## 2012-11-26 NOTE — Assessment & Plan Note (Addendum)
Likely another migraine with an incidental floater in L eye.  Would use ibuprofen prn, flexeril and promethazine prn with sedation caution.  F/u with PCP.  She agrees. >25 min spent with face to face with patient, >50% counseling and/or coordinating care.

## 2012-11-26 NOTE — Progress Notes (Signed)
Present with interpreter today.    H/o HTN.  Had been compliant with meds (noted BID CCB), but would feel dizzy on standing when taking 10mg  lisinopril.  BP had been controlled prev. Elevated her today.  No CP, SOB, BLE edema. Ran out of lisinopril a few days ago.   HA. R sided.  Throbbing.  No focal neuro changes but she does describe having a floater in her L eye. No focal vision changes o/w.  Nausea and phonophobia with the HA.  H/o similar years ago.  Took an NSAID today w/o sig relief.    She has run low on her thyroid meds.    Meds, vitals, and allergies reviewed.   ROS: See HPI.  Otherwise, noncontributory.  nad ncat Tm wnl x2  Nasal and OP exam wnl  EOMI PERRL Neck supple. No LA rrr ctab abd soft, not ttp Ext w/o edema CN 2-12 wnl B, S/S/DTR wnl x4 Speech wnl, coordination wnl

## 2012-12-11 ENCOUNTER — Encounter: Payer: Self-pay | Admitting: Family Medicine

## 2012-12-11 ENCOUNTER — Ambulatory Visit (INDEPENDENT_AMBULATORY_CARE_PROVIDER_SITE_OTHER): Payer: 59 | Admitting: Family Medicine

## 2012-12-11 VITALS — BP 132/82 | HR 90 | Temp 98.3°F | Ht 62.25 in | Wt 160.8 lb

## 2012-12-11 DIAGNOSIS — D649 Anemia, unspecified: Secondary | ICD-10-CM

## 2012-12-11 DIAGNOSIS — R21 Rash and other nonspecific skin eruption: Secondary | ICD-10-CM

## 2012-12-11 DIAGNOSIS — E039 Hypothyroidism, unspecified: Secondary | ICD-10-CM

## 2012-12-11 DIAGNOSIS — G43909 Migraine, unspecified, not intractable, without status migrainosus: Secondary | ICD-10-CM

## 2012-12-11 DIAGNOSIS — I10 Essential (primary) hypertension: Secondary | ICD-10-CM

## 2012-12-11 MED ORDER — LEVOTHYROXINE SODIUM 200 MCG PO TABS: 200.0000 ug | ORAL_TABLET | Freq: Every day | ORAL | Status: DC

## 2012-12-11 MED ORDER — LISINOPRIL 5 MG PO TABS: 5.0000 mg | ORAL_TABLET | Freq: Every day | ORAL | Status: DC

## 2012-12-11 MED ORDER — LEVOTHYROXINE SODIUM 25 MCG PO TABS: 25.0000 ug | ORAL_TABLET | Freq: Every day | ORAL | Status: DC

## 2012-12-11 MED ORDER — DILTIAZEM HCL 90 MG PO TABS: 90.0000 mg | ORAL_TABLET | Freq: Two times a day (BID) | ORAL | Status: DC

## 2012-12-11 MED ORDER — CLOTRIMAZOLE-BETAMETHASONE 1-0.05 % EX CREA: TOPICAL_CREAM | Freq: Two times a day (BID) | CUTANEOUS | Status: DC

## 2012-12-11 NOTE — Assessment & Plan Note (Signed)
Unclear etiology.   ?residual shingles, however pt denies vesicular rash and seems to cross midline. ?impetigo however pt denies exudate. Will treat for now as tinea infection - with lotrisone.  Update if sxs persist.   No further rash.

## 2012-12-11 NOTE — Progress Notes (Signed)
  Subjective:    Patient ID: [name], female    DOB: 07-29-1971, 42 y.o.   MRN: [id number]  HPI CC: f/u HTN  Seen here last month with dx migraine, had very elevated blood pressure.  Migraine better with flexeril, ibuprofen and phenergan.  HTN - had been off lisinopril.  Restarted at 5mg  daily.  bp better controlled.  reports compliance with diltiazem and HCTZ.  No HA, vision changes, CP/tightness, SOB, leg swelling.  Hypothyroidism - compliant with levothyroxine daily.  Due for recheck today. Lab Results  Component Value Date   TSH 0.634 04/25/2012    Feels rash/swelling at scar on abdomen.  Going on for 3 weeks.  Itchy and some discomfort.   No fevers/chills, nausea/vomiting, diarrhea or constipation.  Body mass index is 29.17 kg/(m^2).  Past Medical History  Diagnosis Date  . Hyperthyroidism 10/2000    RAI treatment,  multinodular goiter  . Ectopic pregnancy 04/2006    treated with medication  . Hepatic steatosis 07/2006    on Abd CT done for pain  . 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 Preterm delivery 12/2010    x3, placental abruption, maternal htn, DM  . Hypertension   . Anemia   . ABORTION, SPONTANEOUS 08/18/2007  . IMPAIRED GLUCOSE TOLERANCE 08/18/2007  . Hypothyroidism   . Asthma     Past Surgical History  Procedure Laterality Date  . Appendectomy  1997  . Cholecystectomy  1997  . Ovarian cyst removal    . Laparoscopic lysis intestinal adhesions    . Tubal ligation    . Thyroid surgery      goiter removed  . Cesarean section      x 1  . Laparoscopic hysterectomy  04/24/2012    Procedure: HYSTERECTOMY TOTAL LAPAROSCOPIC;  Surgeon: Ok Edwards, MD;  Location: [facility];  Service: Gynecology;  Laterality: N/A;  2 1/2 hours OR time.  Dr. Audie Box to assisting  Patient is Spanish speaking.  Thanks!!  . Abdominal hysterectomy  04/24/2012    Procedure: HYSTERECTOMY ABDOMINAL;  Surgeon: Ok Edwards, MD;   Location: [facility];  Service: Gynecology;;   Review of Systems Per HPI    Objective:   Physical Exam  Nursing note and vitals reviewed. Constitutional: She appears well-developed and well-nourished. No distress.  HENT:  Head: Normocephalic and atraumatic.  Mouth/Throat: Oropharynx is clear and moist. No oropharyngeal exudate.  Cardiovascular: Normal rate, regular rhythm, normal heart sounds and intact distal pulses.   No murmur heard. Pulmonary/Chest: Effort normal and breath sounds normal. No respiratory distress. She has no wheezes. She has no rales.  Musculoskeletal: She exhibits no edema.  Skin: Skin is warm and dry. Rash noted.     Hyperpigmented lacy patch on right lower abdomen, some excoriations and roughness.  No scales.  Nonvesicular. One lesion crosses midline  Psychiatric: She has a normal mood and affect.       Assessment & Plan:

## 2012-12-11 NOTE — Assessment & Plan Note (Signed)
Improved control with addition of lisinopril.

## 2012-12-11 NOTE — Assessment & Plan Note (Signed)
Resolved with prior treatment of flexeril, ibuprofen, and phenergan.

## 2012-12-11 NOTE — Patient Instructions (Addendum)
Trate crema anti hongo/esteroide para brote en el abdomen.  Si empeora con la medicina, pare crema y aviseme.  Si no mejora, dejeme saber. He renovado medicinas. Gusto verla hoy, llamenos con preguntas. regrese en 6 meses para seguimiento de alta presion

## 2012-12-11 NOTE — Assessment & Plan Note (Signed)
Recheck TSH today.  

## 2012-12-12 LAB — CBC WITH DIFFERENTIAL/PLATELET
Basophils Absolute: 0.1 10*3/uL (ref 0.0–0.1)
Basophils Relative: 0.6 % (ref 0.0–3.0)
Eosinophils Absolute: 0.1 10*3/uL (ref 0.0–0.7)
Lymphocytes Relative: 20.7 % (ref 12.0–46.0)
MCHC: 32.4 g/dL (ref 30.0–36.0)
MCV: 82.6 fl (ref 78.0–100.0)
Monocytes Absolute: 0.5 10*3/uL (ref 0.1–1.0)
Neutro Abs: 6.2 10*3/uL (ref 1.4–7.7)
Neutrophils Relative %: 71 % (ref 43.0–77.0)
RBC: 4.68 Mil/uL (ref 3.87–5.11)
RDW: 16.3 % — ABNORMAL HIGH (ref 11.5–14.6)

## 2012-12-12 LAB — BASIC METABOLIC PANEL
BUN: 18 mg/dL (ref 6–23)
Chloride: 100 mEq/L (ref 96–112)
Creatinine, Ser: 0.7 mg/dL (ref 0.4–1.2)
GFR: 104.33 mL/min (ref >60.00)

## 2012-12-15 ENCOUNTER — Encounter: Payer: Self-pay | Admitting: *Deleted

## 2013-02-17 ENCOUNTER — Encounter (HOSPITAL_COMMUNITY): Payer: Self-pay | Admitting: Emergency Medicine

## 2013-02-17 ENCOUNTER — Emergency Department (HOSPITAL_COMMUNITY)
Admission: EM | Admit: 2013-02-17 | Discharge: 2013-02-17 | Disposition: A | Discharge status: Home / Self Care | Payer: 59 | Source: Home / Self Care

## 2013-02-17 DIAGNOSIS — G43809 Other migraine, not intractable, without status migrainosus: Secondary | ICD-10-CM

## 2013-02-17 MED ORDER — KETOROLAC TROMETHAMINE 30 MG/ML IJ SOLN
30.0000 mg | Freq: Once | INTRAMUSCULAR | Status: AC
  Administered 2013-02-17: 30 mg via INTRAMUSCULAR

## 2013-02-17 MED ORDER — DIPHENHYDRAMINE HCL 50 MG/ML IJ SOLN
INTRAMUSCULAR | Status: AC
  Filled 2013-02-17: qty 1

## 2013-02-17 MED ORDER — ONDANSETRON HCL 4 MG PO TABS: 4.0000 mg | ORAL_TABLET | Freq: Four times a day (QID) | ORAL | Status: DC

## 2013-02-17 MED ORDER — ONDANSETRON 4 MG PO TBDP
4.0000 mg | ORAL_TABLET | Freq: Once | ORAL | Status: AC
  Administered 2013-02-17: 4 mg via ORAL

## 2013-02-17 MED ORDER — KETOROLAC TROMETHAMINE 30 MG/ML IJ SOLN
INTRAMUSCULAR | Status: AC
  Filled 2013-02-17: qty 1

## 2013-02-17 MED ORDER — DIPHENHYDRAMINE HCL 50 MG/ML IJ SOLN
50.0000 mg | Freq: Once | INTRAMUSCULAR | Status: AC
  Administered 2013-02-17: 50 mg via INTRAMUSCULAR

## 2013-02-17 MED ORDER — ONDANSETRON 4 MG PO TBDP
ORAL_TABLET | ORAL | Status: AC
  Filled 2013-02-17: qty 1

## 2013-02-17 NOTE — ED Notes (Signed)
Pt c/o migraine since yesterday with n/v. Feels like around her mouth is numb. No fever or diarrhea. Patient is alert and oriented.

## 2013-02-17 NOTE — ED Provider Notes (Signed)
History     CSN: [id number]  Arrival date & time 02/17/13  1342   None     Chief Complaint  Patient presents with  . Migraine    (Consider location/radiation/quality/duration/timing/severity/associated sxs/prior treatment) Patient is a 42 y.o. female presenting with migraines. The history is provided by the patient and the spouse. No language interpreter was used (spouse translated).  Migraine This is a chronic problem. The current episode started yesterday. The problem has not changed (assoc n/v, no fever or photophobia, denies preg, no uti sx or diarrhea.) since onset.Associated symptoms include headaches.    Past Medical History  Diagnosis Date  . Hyperthyroidism 10/2000    RAI treatment,  multinodular goiter  . Ectopic pregnancy 04/2006    treated with medication  . Hepatic steatosis 07/2006    on Abd CT done for pain  . 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 Preterm delivery 12/2010    x3, placental abruption, maternal htn, DM  . Hypertension   . Anemia   . ABORTION, SPONTANEOUS 08/18/2007  . IMPAIRED GLUCOSE TOLERANCE 08/18/2007  . Hypothyroidism   . Asthma     Past Surgical History  Procedure Laterality Date  . Appendectomy  1997  . Cholecystectomy  1997  . Ovarian cyst removal    . Laparoscopic lysis intestinal adhesions    . Tubal ligation    . Thyroid surgery      goiter removed  . Cesarean section      x 1  . Laparoscopic hysterectomy  04/24/2012    Procedure: HYSTERECTOMY TOTAL LAPAROSCOPIC;  Surgeon: Ok Edwards, MD;  Location: [facility];  Service: Gynecology;  Laterality: N/A;  2 1/2 hours OR time.  Dr. Audie Box to assisting  Patient is Spanish speaking.  Thanks!!  . Abdominal hysterectomy  04/24/2012    Procedure: HYSTERECTOMY ABDOMINAL;  Surgeon: Ok Edwards, MD;  Location: [facility];  Service: Gynecology;;    Family History  Problem Relation Age of Onset  . Hypertension Father   . Stroke Father    hemorrhagic, deceased  . Diabetes Mother   . Coronary artery disease Neg Hx   . Cancer Neg Hx     History  Substance Use Topics  . Smoking status: Never Smoker   . Smokeless tobacco: Never Used  . Alcohol Use: No    OB History   Grav Para Term Preterm Abortions TAB SAB Ect Mult Living   5 1  1 3  3   1       Review of Systems  Constitutional: Negative.   Respiratory: Negative for cough.   Cardiovascular: Negative.   Gastrointestinal: Positive for nausea and vomiting. Negative for diarrhea.  Neurological: Positive for headaches. Negative for dizziness, seizures, speech difficulty and numbness.    Allergies  Review of patient's allergies indicates no known allergies.  Home Medications   Current Outpatient Rx  Name  Route  Sig  Dispense  Refill  . diltiazem (CARDIZEM) 90 MG tablet   Oral   Take 1 tablet (90 mg total) by mouth 2 (two) times daily.   180 tablet   3   . hydrochlorothiazide (HYDRODIURIL) 25 MG tablet   Oral   Take 1 tablet (25 mg total) by mouth daily.   30 tablet   11   . ibuprofen (ADVIL,MOTRIN) 600 MG tablet   Oral   Take 1 tablet (600 mg total) by mouth every 8 (eight) hours as needed for pain (wigh  food.).   30 tablet   0   . levothyroxine (LEVOTHROID) 25 MCG tablet   Oral   Take 1 tablet (25 mcg total) by mouth daily.   90 tablet   3   . levothyroxine (SYNTHROID, LEVOTHROID) 200 MCG tablet   Oral   Take 1 tablet (200 mcg total) by mouth daily.   90 tablet   3   . lisinopril (PRINIVIL,ZESTRIL) 5 MG tablet   Oral   Take 1 tablet (5 mg total) by mouth daily.   90 tablet   3   . clotrimazole-betamethasone (LOTRISONE) cream   Topical   Apply topically 2 (two) times daily. No more than 2 weeks at a time.   30 g   0   . cyclobenzaprine (FLEXERIL) 10 MG tablet   Oral   Take 1 tablet (10 mg total) by mouth 3 (three) times daily as needed (headache).   20 tablet   0   . ferrous sulfate 325 (65 FE) MG tablet   Oral   Take 325 mg  by mouth 3 (three) times daily with meals.          . ondansetron (ZOFRAN) 4 MG tablet   Oral   Take 1 tablet (4 mg total) by mouth every 6 (six) hours. Prn n/v   8 tablet   0     BP 154/113  Pulse 85  Temp(Src) 98 F (36.7 C) (Oral)  Resp 16  SpO2 100%  LMP 11/12/2011  Physical Exam  Nursing note and vitals reviewed. Constitutional: She is oriented to person, place, and time. She appears well-developed and well-nourished.  HENT:  Right Ear: External ear normal.  Left Ear: External ear normal.  Mouth/Throat: Oropharynx is clear and moist.  Eyes: Conjunctivae and EOM are normal. Pupils are equal, round, and reactive to light.  Neck: Normal range of motion. Neck supple.  Cardiovascular: Normal rate, regular rhythm, normal heart sounds and intact distal pulses.   Pulmonary/Chest: Effort normal and breath sounds normal.  Abdominal: Soft. Bowel sounds are normal. She exhibits no distension and no mass. There is no tenderness. There is no rebound and no guarding.  Neurological: She is alert and oriented to person, place, and time.  Skin: Skin is warm and dry.    ED Course  Procedures (including critical care time)  Labs Reviewed - No data to display No results found.   1. Migraine variant       MDM  Sx improved at time of d/c        Linna Hoff, MD 02/17/13 [phone]

## 2013-02-17 NOTE — ED Notes (Signed)
194/114 blood pressure reported to dr Artis Flock, no further orders

## 2013-05-23 ENCOUNTER — Encounter (HOSPITAL_COMMUNITY): Payer: Self-pay | Admitting: Emergency Medicine

## 2013-05-23 ENCOUNTER — Emergency Department (HOSPITAL_COMMUNITY)
Admission: EM | Admit: 2013-05-23 | Discharge: 2013-05-23 | Disposition: A | Discharge status: Home / Self Care | Payer: 59 | Source: Home / Self Care | Attending: Emergency Medicine | Admitting: Emergency Medicine

## 2013-05-23 ENCOUNTER — Emergency Department (HOSPITAL_COMMUNITY)
Admission: EM | Admit: 2013-05-23 | Discharge: 2013-05-23 | Disposition: A | Discharge status: Hospice | Payer: 59 | Source: Home / Self Care

## 2013-05-23 ENCOUNTER — Emergency Department (HOSPITAL_COMMUNITY): Payer: 59

## 2013-05-23 ENCOUNTER — Other Ambulatory Visit (HOSPITAL_COMMUNITY): Payer: Self-pay | Admitting: Gynecology

## 2013-05-23 DIAGNOSIS — K859 Acute pancreatitis without necrosis or infection, unspecified: Secondary | ICD-10-CM | POA: Insufficient documentation

## 2013-05-23 DIAGNOSIS — Z862 Personal history of diseases of the blood and blood-forming organs and certain disorders involving the immune mechanism: Secondary | ICD-10-CM | POA: Insufficient documentation

## 2013-05-23 DIAGNOSIS — E0590 Thyrotoxicosis, unspecified without thyrotoxic crisis or storm: Secondary | ICD-10-CM | POA: Insufficient documentation

## 2013-05-23 DIAGNOSIS — R109 Unspecified abdominal pain: Secondary | ICD-10-CM

## 2013-05-23 DIAGNOSIS — Z8673 Personal history of transient ischemic attack (TIA), and cerebral infarction without residual deficits: Secondary | ICD-10-CM | POA: Insufficient documentation

## 2013-05-23 DIAGNOSIS — J45909 Unspecified asthma, uncomplicated: Secondary | ICD-10-CM | POA: Insufficient documentation

## 2013-05-23 DIAGNOSIS — E039 Hypothyroidism, unspecified: Secondary | ICD-10-CM | POA: Insufficient documentation

## 2013-05-23 DIAGNOSIS — Z79899 Other long term (current) drug therapy: Secondary | ICD-10-CM | POA: Insufficient documentation

## 2013-05-23 DIAGNOSIS — Z8719 Personal history of other diseases of the digestive system: Secondary | ICD-10-CM | POA: Insufficient documentation

## 2013-05-23 DIAGNOSIS — I10 Essential (primary) hypertension: Secondary | ICD-10-CM | POA: Insufficient documentation

## 2013-05-23 LAB — CBC WITH DIFFERENTIAL/PLATELET
Eosinophils Absolute: 0.1 10*3/uL (ref 0.0–0.7)
Hemoglobin: 13.8 g/dL (ref 12.0–15.0)
Lymphocytes Relative: 19 % (ref 12–46)
Lymphs Abs: 1.8 10*3/uL (ref 0.7–4.0)
Monocytes Relative: 6 % (ref 3–12)
Neutro Abs: 6.9 10*3/uL (ref 1.7–7.7)
Neutrophils Relative %: 73 % (ref 43–77)
Platelets: 146 10*3/uL — ABNORMAL LOW (ref 150–400)
RBC: 4.72 MIL/uL (ref 3.87–5.11)
WBC: 9.5 10*3/uL (ref 4.0–10.5)

## 2013-05-23 LAB — COMPREHENSIVE METABOLIC PANEL
ALT: 21 U/L (ref 0–35)
Alkaline Phosphatase: 94 U/L (ref 39–117)
CO2: 28 mEq/L (ref 19–32)
Chloride: 102 mEq/L (ref 96–112)
GFR calc Af Amer: >90 mL/min (ref >90)
Glucose, Bld: 119 mg/dL — ABNORMAL HIGH (ref 70–99)
Potassium: 4.1 mEq/L (ref 3.5–5.1)
Sodium: 139 mEq/L (ref 135–145)
Total Bilirubin: 0.1 mg/dL — ABNORMAL LOW (ref 0.3–1.2)
Total Protein: 7.3 g/dL (ref 6.0–8.3)

## 2013-05-23 LAB — POCT URINALYSIS DIP (DEVICE)
Glucose, UA: NEGATIVE mg/dL
Leukocytes, UA: NEGATIVE
Nitrite: NEGATIVE
Urobilinogen, UA: 0.2 mg/dL (ref 0.0–1.0)

## 2013-05-23 LAB — URINALYSIS, ROUTINE W REFLEX MICROSCOPIC
Bilirubin Urine: NEGATIVE
Glucose, UA: NEGATIVE mg/dL
Hgb urine dipstick: NEGATIVE
Ketones, ur: NEGATIVE mg/dL
Protein, ur: NEGATIVE mg/dL
Urobilinogen, UA: 0.2 mg/dL (ref 0.0–1.0)

## 2013-05-23 MED ORDER — MORPHINE SULFATE 4 MG/ML IJ SOLN
4.0000 mg | Freq: Once | INTRAMUSCULAR | Status: AC
  Administered 2013-05-23: 4 mg via INTRAVENOUS
  Filled 2013-05-23: qty 1

## 2013-05-23 MED ORDER — ONDANSETRON HCL 4 MG/2ML IJ SOLN
4.0000 mg | Freq: Once | INTRAMUSCULAR | Status: AC
  Administered 2013-05-23: 4 mg via INTRAVENOUS
  Filled 2013-05-23: qty 2

## 2013-05-23 MED ORDER — IOHEXOL 300 MG/ML  SOLN
25.0000 mL | INTRAMUSCULAR | Status: DC | PRN
  Administered 2013-05-23: 25 mL via ORAL

## 2013-05-23 MED ORDER — IOHEXOL 300 MG/ML  SOLN
100.0000 mL | Freq: Once | INTRAMUSCULAR | Status: AC | PRN
  Administered 2013-05-23: 100 mL via INTRAVENOUS

## 2013-05-23 MED ORDER — ONDANSETRON 4 MG PO TBDP: 4.0000 mg | ORAL_TABLET | Freq: Three times a day (TID) | ORAL | Status: DC | PRN

## 2013-05-23 MED ORDER — OXYCODONE-ACETAMINOPHEN 5-325 MG PO TABS: 2.0000 | ORAL_TABLET | ORAL | Status: DC | PRN

## 2013-05-23 NOTE — ED Notes (Signed)
Pt c/o left sided flank pain x 2 days; pt sent from [facility] for further eval

## 2013-05-23 NOTE — ED Notes (Signed)
EDP notified that pt is experiencing chest pain. EKG performed and given to physician.

## 2013-05-23 NOTE — ED Notes (Signed)
Pt given d/c instructions and verbalized understanding. NAD at this time. VS are stable.  

## 2013-05-23 NOTE — ED Provider Notes (Signed)
CSN: [id number]     Arrival date & time 05/23/13  1610 History   First MD Initiated Contact with Patient 05/23/13 1743    Patient speaks no English. History is obtained from medical interpreter using [place] language line Chief Complaint  Patient presents with  . Abdominal Pain   (Consider location/radiation/quality/duration/timing/severity/associated sxs/prior Treatment) Patient is a 42 y.o. female presenting with abdominal pain.  Abdominal Pain Associated symptoms: nausea    Complains of left lower quadrant pain radiating to The left flank onset last night. Pain is constant worse with changing positions improved with remaining still. Last bowel movement was this morning, normal. Associated symptoms include chills. No vomiting, but does admit to nausea. She treated herself with ibuprofen this morning, without relief. Pain is sharp, moderate to severe present. She has not had pain like this before. Past Medical History  Diagnosis Date  . Hyperthyroidism 10/2000    RAI treatment,  multinodular goiter  . Ectopic pregnancy 04/2006    treated with medication  . Hepatic steatosis 07/2006    on Abd CT done for pain  . 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 Preterm delivery 12/2010    x3, placental abruption, maternal htn, DM  . Hypertension   . Anemia   . ABORTION, SPONTANEOUS 08/18/2007  . IMPAIRED GLUCOSE TOLERANCE 08/18/2007  . Hypothyroidism   . Asthma    Past Surgical History  Procedure Laterality Date  . Appendectomy  1997  . Cholecystectomy  1997  . Ovarian cyst removal    . Laparoscopic lysis intestinal adhesions    . Tubal ligation    . Thyroid surgery      goiter removed  . Cesarean section      x 1  . Laparoscopic hysterectomy  04/24/2012    Procedure: HYSTERECTOMY TOTAL LAPAROSCOPIC;  Surgeon: Ok Edwards, MD;  Location: [facility];  Service: Gynecology;  Laterality: N/A;  2 1/2 hours OR time.  Dr. Audie Box to assisting  Patient is  Spanish speaking.  Thanks!!  . Abdominal hysterectomy  04/24/2012    Procedure: HYSTERECTOMY ABDOMINAL;  Surgeon: Ok Edwards, MD;  Location: [facility];  Service: Gynecology;;   Family History  Problem Relation Age of Onset  . Hypertension Father   . Stroke Father     hemorrhagic, deceased  . Diabetes Mother   . Coronary artery disease Neg Hx   . Cancer Neg Hx    History  Substance Use Topics  . Smoking status: Never Smoker   . Smokeless tobacco: Never Used  . Alcohol Use: No   OB History   Grav Para Term Preterm Abortions TAB SAB Ect Mult Living   5 1  1 3  3   1      Review of Systems  Gastrointestinal: Positive for nausea and abdominal pain.  Genitourinary: Positive for flank pain.  All other systems reviewed and are negative.    Allergies  Review of patient's allergies indicates no known allergies.  Home Medications   Current Outpatient Rx  Name  Route  Sig  Dispense  Refill  . diltiazem (CARDIZEM) 90 MG tablet   Oral   Take 90 mg by mouth 2 (two) times daily.         . hydrochlorothiazide (HYDRODIURIL) 25 MG tablet   Oral   Take 25 mg by mouth daily.         Marland Kitchen ibuprofen (ADVIL,MOTRIN) 600 MG tablet   Oral   Take 600  mg by mouth every 8 (eight) hours as needed for pain.         Marland Kitchen levothyroxine (SYNTHROID, LEVOTHROID) 200 MCG tablet   Oral   Take 200 mcg by mouth daily. Total dose =         . levothyroxine (SYNTHROID, LEVOTHROID) 25 MCG tablet   Oral   Take 25 mcg by mouth daily. Total dose =         . lisinopril (PRINIVIL,ZESTRIL) 5 MG tablet   Oral   Take 1 tablet (5 mg total) by mouth daily.   90 tablet   3    BP 168/95  Pulse 78  Temp(Src) 98.6 F (37 C) (Oral)  Resp 16  SpO2 96%  LMP 11/12/2011 Physical Exam  Nursing note and vitals reviewed. Constitutional: She is oriented to person, place, and time. She appears well-developed and well-nourished.  HENT:  Head: Normocephalic and atraumatic.  Eyes:  Conjunctivae are normal. Pupils are equal, round, and reactive to light.  Neck: Neck supple. No tracheal deviation present. No thyromegaly present.  Cardiovascular: Normal rate and regular rhythm.   No murmur heard. Pulmonary/Chest: Effort normal and breath sounds normal.  Abdominal: Soft. Bowel sounds are normal. She exhibits no distension and no mass. There is tenderness. There is no rebound and no guarding.  Tender at left lower quadrant, left upper quadrant epigastrium  Genitourinary:  Mild left flank tenderness  Musculoskeletal: Normal range of motion. She exhibits no edema and no tenderness.  Neurological: She is alert and oriented to person, place, and time. Coordination normal.  Skin: Skin is warm and dry. No rash noted.  Psychiatric: She has a normal mood and affect.    ED Course  Procedures (including critical care time) Labs Review Labs Reviewed  CBC WITH DIFFERENTIAL - Abnormal; Notable for the following:    Platelets 146 (*)    All other components within normal limits  COMPREHENSIVE METABOLIC PANEL - Abnormal; Notable for the following:    Glucose, Bld 119 (*)    Total Bilirubin 0.1 (*)    All other components within normal limits  LIPASE, BLOOD - Abnormal; Notable for the following:    Lipase 138 (*)    All other components within normal limits   nursing obtained EKG at 2009 p.m. patient complained of chest pain. Upon my reevaluation she pointed to her epigastrium and on reexamination she is tender at epigastrium.  Date: 05/23/2013  Rate: 75  Rhythm: normal sinus rhythm  QRS Axis: normal  Intervals: normal  ST/T Wave abnormalities: normal  Conduction Disutrbances: none  Narrative Interpretation: unremarkable Results for orders placed during the hospital encounter of 05/23/13  CBC WITH DIFFERENTIAL      Result Value Range   WBC 9.5  4.0 - 10.5 K/uL   RBC 4.72  3.87 - 5.11 MIL/uL   Hemoglobin 13.8  12.0 - 15.0 g/dL   HCT 16.1  09.6 - 04.5 %   MCV 84.1  78.0 -  100.0 fL   MCH 29.2  26.0 - 34.0 pg   MCHC 34.8  30.0 - 36.0 g/dL   RDW 40.9  81.1 - 91.4 %   Platelets 146 (*) 150 - 400 K/uL   Neutrophils Relative % 73  43 - 77 %   Neutro Abs 6.9  1.7 - 7.7 K/uL   Lymphocytes Relative 19  12 - 46 %   Lymphs Abs 1.8  0.7 - 4.0 K/uL   Monocytes Relative 6  3 -  12 %   Monocytes Absolute 0.6  0.1 - 1.0 K/uL   Eosinophils Relative 1  0 - 5 %   Eosinophils Absolute 0.1  0.0 - 0.7 K/uL   Basophils Relative 0  0 - 1 %   Basophils Absolute 0.0  0.0 - 0.1 K/uL  COMPREHENSIVE METABOLIC PANEL      Result Value Range   Sodium 139  135 - 145 mEq/L   Potassium 4.1  3.5 - 5.1 mEq/L   Chloride 102  96 - 112 mEq/L   CO2 28  19 - 32 mEq/L   Glucose, Bld 119 (*) 70 - 99 mg/dL   BUN 15  6 - 23 mg/dL   Creatinine, Ser 3.08  0.50 - 1.10 mg/dL   Calcium 8.9  8.4 - 65.7 mg/dL   Total Protein 7.3  6.0 - 8.3 g/dL   Albumin 3.7  3.5 - 5.2 g/dL   AST 18  0 - 37 U/L   ALT 21  0 - 35 U/L   Alkaline Phosphatase 94  39 - 117 U/L   Total Bilirubin 0.1 (*) 0.3 - 1.2 mg/dL   GFR calc non Af Amer >90  >90 mL/min   GFR calc Af Amer >90  >90 mL/min  LIPASE, BLOOD      Result Value Range   Lipase 138 (*) 11 - 59 U/L  URINALYSIS, ROUTINE W REFLEX MICROSCOPIC      Result Value Range   Color, Urine YELLOW  YELLOW   APPearance CLEAR  CLEAR   Specific Gravity, Urine 1.021  1.005 - 1.030   pH 7.0  5.0 - 8.0   Glucose, UA NEGATIVE  NEGATIVE mg/dL   Hgb urine dipstick NEGATIVE  NEGATIVE   Bilirubin Urine NEGATIVE  NEGATIVE   Ketones, ur NEGATIVE  NEGATIVE mg/dL   Protein, ur NEGATIVE  NEGATIVE mg/dL   Urobilinogen, UA 0.2  0.0 - 1.0 mg/dL   Nitrite NEGATIVE  NEGATIVE   Leukocytes, UA NEGATIVE  NEGATIVE   No results found.  9:30 PM patient resting comfortably. States her pain is under control.  Imaging Review No results found. MDM  No diagnosis found.  mortality from pancreatitis < 1% based on Ransom criteria. Patient looks clinically well. I feel that she is suitable  for discharge with followup with her gastroenterologist    Doug Sou, MD 05/23/13 2130

## 2013-05-23 NOTE — ED Notes (Signed)
C/o flank pain which started Friday.  Ibuprofen taken for pain but no relief.

## 2013-05-23 NOTE — ED Provider Notes (Signed)
9:16 PM Patient signed out to me by Dr. Ethelda Chick. Patient presents with epigastric pain with associated nausea and vomiting. Labs show elevated lipase. CT abdomen pelvis pending. Patient will likely be discharged unless unexpected finding on CT.   10:46 PM CT unremarkable to acute change. Patient feeling better and is ready to go home. I have advised patient to schedule a follow up appointment with [facility]. I spoke with Dr. Christella Hartigan who has advised her to call the office on Monday to schedule an appointment. Patient will be discharged with nausea and pain medication. Patient instructed to return with worsening or concerning symptoms. Vitals stable and patient afebrile. Patient and family understand the results and plan and are agreeable.   Laura Beck, PA-C 05/23/13 2249

## 2013-05-23 NOTE — ED Provider Notes (Signed)
CSN: [id number]     Arrival date & time 05/23/13  1428 History   None    Chief Complaint  Patient presents with  . Flank Pain   (Consider location/radiation/quality/duration/timing/severity/associated sxs/prior Treatment) HPI Comments: Patient's history is translated by her husband who is present today. Patient has a 24 hour history of "sharp" LLQ radiating to back pain. Mild nausea no emesis. No diarrhea. No fever or chills. No urinary symptoms are noted. No prior history of abdominal issues.   Patient is a 42 y.o. female presenting with flank pain. The history is provided by the patient.  Flank Pain    Past Medical History  Diagnosis Date  . Hyperthyroidism 10/2000    RAI treatment,  multinodular goiter  . Ectopic pregnancy 04/2006    treated with medication  . Hepatic steatosis 07/2006    on Abd CT done for pain  . 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 Preterm delivery 12/2010    x3, placental abruption, maternal htn, DM  . Hypertension   . Anemia   . ABORTION, SPONTANEOUS 08/18/2007  . IMPAIRED GLUCOSE TOLERANCE 08/18/2007  . Hypothyroidism   . Asthma    Past Surgical History  Procedure Laterality Date  . Appendectomy  1997  . Cholecystectomy  1997  . Ovarian cyst removal    . Laparoscopic lysis intestinal adhesions    . Tubal ligation    . Thyroid surgery      goiter removed  . Cesarean section      x 1  . Laparoscopic hysterectomy  04/24/2012    Procedure: HYSTERECTOMY TOTAL LAPAROSCOPIC;  Surgeon: Ok Edwards, MD;  Location: [facility];  Service: Gynecology;  Laterality: N/A;  2 1/2 hours OR time.  Dr. Audie Box to assisting  Patient is Spanish speaking.  Thanks!!  . Abdominal hysterectomy  04/24/2012    Procedure: HYSTERECTOMY ABDOMINAL;  Surgeon: Ok Edwards, MD;  Location: [facility];  Service: Gynecology;;   Family History  Problem Relation Age of Onset  . Hypertension Father   . Stroke Father     hemorrhagic, deceased   . Diabetes Mother   . Coronary artery disease Neg Hx   . Cancer Neg Hx    History  Substance Use Topics  . Smoking status: Never Smoker   . Smokeless tobacco: Never Used  . Alcohol Use: No   OB History   Grav Para Term Preterm Abortions TAB SAB Ect Mult Living   5 1  1 3  3   1      Review of Systems  Genitourinary: Positive for flank pain.  All other systems reviewed and are negative.    Allergies  Review of patient's allergies indicates no known allergies.  Home Medications   Current Outpatient Rx  Name  Route  Sig  Dispense  Refill  . clotrimazole-betamethasone (LOTRISONE) cream   Topical   Apply topically 2 (two) times daily. No more than 2 weeks at a time.   30 g   0   . cyclobenzaprine (FLEXERIL) 10 MG tablet   Oral   Take 1 tablet (10 mg total) by mouth 3 (three) times daily as needed (headache).   20 tablet   0   . diltiazem (CARDIZEM) 90 MG tablet   Oral   Take 1 tablet (90 mg total) by mouth 2 (two) times daily.   180 tablet   3   . ferrous sulfate 325 (65 FE) MG tablet  Oral   Take 325 mg by mouth 3 (three) times daily with meals.          Marland Kitchen EXPIRED: hydrochlorothiazide (HYDRODIURIL) 25 MG tablet   Oral   Take 1 tablet (25 mg total) by mouth daily.   30 tablet   11   . ibuprofen (ADVIL,MOTRIN) 600 MG tablet   Oral   Take 1 tablet (600 mg total) by mouth every 8 (eight) hours as needed for pain (wigh food.).   30 tablet   0   . levothyroxine (LEVOTHROID) 25 MCG tablet   Oral   Take 1 tablet (25 mcg total) by mouth daily.   90 tablet   3   . levothyroxine (SYNTHROID, LEVOTHROID) 200 MCG tablet   Oral   Take 1 tablet (200 mcg total) by mouth daily.   90 tablet   3   . lisinopril (PRINIVIL,ZESTRIL) 5 MG tablet   Oral   Take 1 tablet (5 mg total) by mouth daily.   90 tablet   3   . ondansetron (ZOFRAN) 4 MG tablet   Oral   Take 1 tablet (4 mg total) by mouth every 6 (six) hours. Prn n/v   8 tablet   0    BP 166/96   Pulse 88  Temp(Src) 98.4 F (36.9 C) (Oral)  Resp 18  SpO2 100%  LMP 11/12/2011 Physical Exam  Nursing note and vitals reviewed. Constitutional: She is oriented to person, place, and time. She appears well-developed and well-nourished. She appears distressed.  Cardiovascular: Normal rate and regular rhythm.   Pulmonary/Chest: Effort normal and breath sounds normal. She has no rales. She exhibits no tenderness.  Abdominal: Soft. Bowel sounds are normal. She exhibits no distension and no mass. There is tenderness. There is guarding. There is no rebound.  Grimacing and guarding with pain to LLQ into left flank region.   Neurological: She is alert and oriented to person, place, and time.  Skin: Skin is warm and dry. She is not diaphoretic.  Psychiatric: Her behavior is normal.    ED Course  Procedures (including critical care time) Labs Review Labs Reviewed  POCT URINALYSIS DIP (DEVICE) - Abnormal; Notable for the following:    Bilirubin Urine SMALL (*)    Protein, ur 30 (*)    All other components within normal limits   Imaging Review No results found.  MDM   1. Abdominal pain    Suspect diverticulitis. Discussed with Dr. Artis Flock will send to the ER for further diagnostic work up.     Azucena Fallen, PA-C 05/23/13 1553

## 2013-05-24 ENCOUNTER — Inpatient Hospital Stay (HOSPITAL_COMMUNITY)
Admission: EM | Admit: 2013-05-24 | Discharge: 2013-05-26 | DRG: 392 | Disposition: A | Discharge status: Home / Self Care | Payer: 59 | Attending: Internal Medicine | Admitting: Internal Medicine

## 2013-05-24 ENCOUNTER — Encounter (HOSPITAL_COMMUNITY): Payer: Self-pay | Admitting: *Deleted

## 2013-05-24 DIAGNOSIS — M545 Low back pain, unspecified: Secondary | ICD-10-CM

## 2013-05-24 DIAGNOSIS — E890 Postprocedural hypothyroidism: Secondary | ICD-10-CM | POA: Diagnosis present

## 2013-05-24 DIAGNOSIS — E785 Hyperlipidemia, unspecified: Secondary | ICD-10-CM

## 2013-05-24 DIAGNOSIS — A084 Viral intestinal infection, unspecified: Secondary | ICD-10-CM | POA: Diagnosis present

## 2013-05-24 DIAGNOSIS — G8918 Other acute postprocedural pain: Secondary | ICD-10-CM

## 2013-05-24 DIAGNOSIS — R200 Anesthesia of skin: Secondary | ICD-10-CM

## 2013-05-24 DIAGNOSIS — A088 Other specified intestinal infections: Principal | ICD-10-CM | POA: Diagnosis present

## 2013-05-24 DIAGNOSIS — Z79899 Other long term (current) drug therapy: Secondary | ICD-10-CM

## 2013-05-24 DIAGNOSIS — G43909 Migraine, unspecified, not intractable, without status migrainosus: Secondary | ICD-10-CM

## 2013-05-24 DIAGNOSIS — I10 Essential (primary) hypertension: Secondary | ICD-10-CM

## 2013-05-24 DIAGNOSIS — N926 Irregular menstruation, unspecified: Secondary | ICD-10-CM

## 2013-05-24 DIAGNOSIS — E663 Overweight: Secondary | ICD-10-CM

## 2013-05-24 DIAGNOSIS — R112 Nausea with vomiting, unspecified: Secondary | ICD-10-CM

## 2013-05-24 DIAGNOSIS — E039 Hypothyroidism, unspecified: Secondary | ICD-10-CM

## 2013-05-24 DIAGNOSIS — R21 Rash and other nonspecific skin eruption: Secondary | ICD-10-CM

## 2013-05-24 DIAGNOSIS — D649 Anemia, unspecified: Secondary | ICD-10-CM

## 2013-05-24 DIAGNOSIS — R109 Unspecified abdominal pain: Secondary | ICD-10-CM

## 2013-05-24 DIAGNOSIS — R10A2 Flank pain, left side: Secondary | ICD-10-CM

## 2013-05-24 DIAGNOSIS — R102 Pelvic and perineal pain unspecified side: Secondary | ICD-10-CM

## 2013-05-24 DIAGNOSIS — Z8673 Personal history of transient ischemic attack (TIA), and cerebral infarction without residual deficits: Secondary | ICD-10-CM

## 2013-05-24 DIAGNOSIS — K429 Umbilical hernia without obstruction or gangrene: Secondary | ICD-10-CM

## 2013-05-24 DIAGNOSIS — I6350 Cerebral infarction due to unspecified occlusion or stenosis of unspecified cerebral artery: Secondary | ICD-10-CM

## 2013-05-24 DIAGNOSIS — J45909 Unspecified asthma, uncomplicated: Secondary | ICD-10-CM

## 2013-05-24 LAB — CBC WITH DIFFERENTIAL/PLATELET
Basophils Absolute: 0 10*3/uL (ref 0.0–0.1)
Basophils Relative: 0 % (ref 0–1)
Eosinophils Absolute: 0.1 10*3/uL (ref 0.0–0.7)
Eosinophils Relative: 2 % (ref 0–5)
HCT: 39.4 % (ref 36.0–46.0)
MCH: 28.8 pg (ref 26.0–34.0)
MCHC: 34 g/dL (ref 30.0–36.0)
MCV: 84.7 fL (ref 78.0–100.0)
Monocytes Absolute: 0.6 10*3/uL (ref 0.1–1.0)
Platelets: 147 10*3/uL — ABNORMAL LOW (ref 150–400)
RBC: 4.65 MIL/uL (ref 3.87–5.11)
RDW: 14.2 % (ref 11.5–15.5)
WBC: 7.4 10*3/uL (ref 4.0–10.5)

## 2013-05-24 LAB — COMPREHENSIVE METABOLIC PANEL
ALT: 34 U/L (ref 0–35)
AST: 21 U/L (ref 0–37)
Albumin: 3.7 g/dL (ref 3.5–5.2)
Calcium: 8.8 mg/dL (ref 8.4–10.5)
Creatinine, Ser: 1.11 mg/dL — ABNORMAL HIGH (ref 0.50–1.10)
GFR calc Af Amer: 70 mL/min — ABNORMAL LOW (ref >90)
GFR calc non Af Amer: 60 mL/min — ABNORMAL LOW (ref >90)
Sodium: 135 mEq/L (ref 135–145)
Total Protein: 7.2 g/dL (ref 6.0–8.3)

## 2013-05-24 LAB — URINALYSIS, ROUTINE W REFLEX MICROSCOPIC
Glucose, UA: NEGATIVE mg/dL
Hgb urine dipstick: NEGATIVE
Leukocytes, UA: NEGATIVE
Nitrite: NEGATIVE
Protein, ur: NEGATIVE mg/dL
Specific Gravity, Urine: 1.028 (ref 1.005–1.030)
Urobilinogen, UA: 1 mg/dL (ref 0.0–1.0)

## 2013-05-24 NOTE — ED Notes (Signed)
The pt is c/o chest pain  For 23 days.  She was seen here yesterday for the same and diagnosed with pancreatitis.  She continues to have pain nv and diarrhea

## 2013-05-25 ENCOUNTER — Observation Stay (HOSPITAL_COMMUNITY): Payer: 59

## 2013-05-25 DIAGNOSIS — R112 Nausea with vomiting, unspecified: Secondary | ICD-10-CM

## 2013-05-25 DIAGNOSIS — R197 Diarrhea, unspecified: Secondary | ICD-10-CM

## 2013-05-25 DIAGNOSIS — A084 Viral intestinal infection, unspecified: Secondary | ICD-10-CM | POA: Diagnosis present

## 2013-05-25 DIAGNOSIS — I10 Essential (primary) hypertension: Secondary | ICD-10-CM

## 2013-05-25 LAB — BASIC METABOLIC PANEL
CO2: 24 mEq/L (ref 19–32)
Calcium: 8.4 mg/dL (ref 8.4–10.5)
Creatinine, Ser: 0.81 mg/dL (ref 0.50–1.10)
GFR calc Af Amer: >90 mL/min (ref >90)
GFR calc non Af Amer: 88 mL/min — ABNORMAL LOW (ref >90)
Glucose, Bld: 98 mg/dL (ref 70–99)
Sodium: 135 mEq/L (ref 135–145)

## 2013-05-25 LAB — CBC
Hemoglobin: 12.8 g/dL (ref 12.0–15.0)
MCH: 28.1 pg (ref 26.0–34.0)
MCHC: 32.7 g/dL (ref 30.0–36.0)
Platelets: 132 10*3/uL — ABNORMAL LOW (ref 150–400)
RDW: 14.1 % (ref 11.5–15.5)

## 2013-05-25 LAB — D-DIMER, QUANTITATIVE: D-Dimer, Quant: 0.36 ug/mL-FEU (ref 0.00–0.48)

## 2013-05-25 MED ORDER — ONDANSETRON HCL 4 MG/2ML IJ SOLN
4.0000 mg | Freq: Once | INTRAMUSCULAR | Status: AC
  Administered 2013-05-25: 4 mg via INTRAVENOUS
  Filled 2013-05-25: qty 2

## 2013-05-25 MED ORDER — FENTANYL CITRATE 0.05 MG/ML IJ SOLN
50.0000 ug | INTRAMUSCULAR | Status: DC | PRN
  Administered 2013-05-25: 50 ug via INTRAVENOUS
  Filled 2013-05-25: qty 2

## 2013-05-25 MED ORDER — LISINOPRIL 5 MG PO TABS
5.0000 mg | ORAL_TABLET | Freq: Every day | ORAL | Status: DC
Start: 2013-05-25 — End: 2013-05-26
  Administered 2013-05-25 – 2013-05-26 (×2): 5 mg via ORAL
  Filled 2013-05-25 (×2): qty 1

## 2013-05-25 MED ORDER — SODIUM CHLORIDE 0.9 % IV SOLN
INTRAVENOUS | Status: DC
  Administered 2013-05-25: 1000 mL via INTRAVENOUS

## 2013-05-25 MED ORDER — SODIUM CHLORIDE 0.9 % IV SOLN
INTRAVENOUS | Status: DC
  Administered 2013-05-25: 09:00:00 via INTRAVENOUS

## 2013-05-25 MED ORDER — ONDANSETRON HCL 4 MG/2ML IJ SOLN
4.0000 mg | Freq: Four times a day (QID) | INTRAMUSCULAR | Status: DC | PRN
Start: 2013-05-25 — End: 2013-05-25

## 2013-05-25 MED ORDER — DILTIAZEM HCL 90 MG PO TABS
90.0000 mg | ORAL_TABLET | Freq: Two times a day (BID) | ORAL | Status: DC
  Administered 2013-05-25 – 2013-05-26 (×3): 90 mg via ORAL
  Filled 2013-05-25 (×4): qty 1

## 2013-05-25 MED ORDER — HYDROMORPHONE HCL PF 1 MG/ML IJ SOLN: 1.0000 mg | INTRAMUSCULAR | Status: DC | PRN

## 2013-05-25 MED ORDER — LEVOTHYROXINE SODIUM 25 MCG PO TABS: 25.0000 ug | ORAL_TABLET | Freq: Every day | ORAL | Status: DC

## 2013-05-25 MED ORDER — ONDANSETRON HCL 4 MG/2ML IJ SOLN: 4.0000 mg | Freq: Three times a day (TID) | INTRAMUSCULAR | Status: DC | PRN

## 2013-05-25 MED ORDER — HEPARIN SODIUM (PORCINE) 5000 UNIT/ML IJ SOLN
5000.0000 [IU] | Freq: Three times a day (TID) | INTRAMUSCULAR | Status: DC
  Administered 2013-05-25 – 2013-05-26 (×2): 5000 [IU] via SUBCUTANEOUS
  Filled 2013-05-25 (×6): qty 1

## 2013-05-25 MED ORDER — LEVOTHYROXINE SODIUM 112 MCG PO TABS
224.0000 ug | ORAL_TABLET | Freq: Every day | ORAL | Status: DC
  Administered 2013-05-25 – 2013-05-26 (×2): 224 ug via ORAL
  Filled 2013-05-25 (×3): qty 2

## 2013-05-25 MED ORDER — POTASSIUM CHLORIDE 10 MEQ/100ML IV SOLN
10.0000 meq | INTRAVENOUS | Status: AC
  Administered 2013-05-25 (×4): 10 meq via INTRAVENOUS
  Filled 2013-05-25 (×4): qty 100

## 2013-05-25 MED ORDER — PROMETHAZINE HCL 12.5 MG PO TABS: 12.5000 mg | ORAL_TABLET | Freq: Four times a day (QID) | ORAL | Status: DC | PRN

## 2013-05-25 NOTE — Progress Notes (Signed)
Up ambulating in hall with husband tol well

## 2013-05-25 NOTE — Progress Notes (Signed)
Patient admitted after midnight. Slightly nauseated. No vomiting. No diarrhea. Minimal lower abdominal pain. Feels slightly weak. Will advance diet. Replete potassium. Decrease IV fluids. Mobilized. Possibly home tomorrow if stable.  Crista Curb, M.D.

## 2013-05-25 NOTE — H&P (Signed)
[facility] History and Physical  Burnie Hank WUJ:[id number] DOB: [birth year] DOA: 05/24/2013  Referring physician: ED PCP: Eustaquio Boyden, MD   Chief Complaint: N/V/D, abd pain  HPI: [name] is a 42 y.o. female who presents to the ED with LUQ abd pain, N/V/D.  This onset yesterday and has been persistent since that time.  Seen in ED yesterday for same, was discharged home but despite taking meds symptoms persisted so she returned to the ED.  There was mildly elevated lipase yesterday although this has normalized on todays exam.  CT abd yesterday was negative.  Of note these symptoms occur in the context of a recent trip to visit her family in [place] 3 days ago.  It seems that multiple members of her family that she was visiting, including 2 brothers, were sick with identical symptoms.  Review of Systems: 12 systems reviewed and otherwise negative.  Past Medical History  Diagnosis Date  . Hyperthyroidism 10/2000    RAI treatment,  multinodular goiter  . Ectopic pregnancy 04/2006    treated with medication  . Hepatic steatosis 07/2006    on Abd CT done for pain  . 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 Preterm delivery 12/2010    x3, placental abruption, maternal htn, DM  . Hypertension   . Anemia   . ABORTION, SPONTANEOUS 08/18/2007  . IMPAIRED GLUCOSE TOLERANCE 08/18/2007  . Hypothyroidism   . Asthma    Past Surgical History  Procedure Laterality Date  . Appendectomy  1997  . Cholecystectomy  1997  . Ovarian cyst removal    . Laparoscopic lysis intestinal adhesions    . Tubal ligation    . Thyroid surgery      goiter removed  . Cesarean section      x 1  . Laparoscopic hysterectomy  04/24/2012    Procedure: HYSTERECTOMY TOTAL LAPAROSCOPIC;  Surgeon: Ok Edwards, MD;  Location: [facility];  Service: Gynecology;  Laterality: N/A;  2 1/2 hours OR time.  Dr. Audie Box to assisting  Patient is Spanish speaking.  Thanks!!  .  Abdominal hysterectomy  04/24/2012    Procedure: HYSTERECTOMY ABDOMINAL;  Surgeon: Ok Edwards, MD;  Location: [facility];  Service: Gynecology;;   Social History:  reports that she has never smoked. She has never used smokeless tobacco. She reports that she does not drink alcohol or use illicit drugs.  No Known Allergies  Family History  Problem Relation Age of Onset  . Hypertension Father   . Stroke Father     hemorrhagic, deceased  . Diabetes Mother   . Coronary artery disease Neg Hx   . Cancer Neg Hx     Prior to Admission medications   Medication Sig Start Date End Date Taking? Authorizing Provider  diltiazem (CARDIZEM) 90 MG tablet Take 90 mg by mouth 2 (two) times daily.   Yes Historical Provider, MD  hydrochlorothiazide (HYDRODIURIL) 25 MG tablet Take 25 mg by mouth daily.   Yes Historical Provider, MD  levothyroxine (SYNTHROID, LEVOTHROID) 200 MCG tablet Take 200 mcg by mouth daily. Total dose = 12/11/12  Yes Eustaquio Boyden, MD  levothyroxine (SYNTHROID, LEVOTHROID) 25 MCG tablet Take 25 mcg by mouth daily. Total dose = 12/11/12 12/11/13 Yes Eustaquio Boyden, MD  lisinopril (PRINIVIL,ZESTRIL) 5 MG tablet Take 1 tablet (5 mg total) by mouth daily. 12/11/12  Yes Eustaquio Boyden, MD  ondansetron (ZOFRAN ODT) 4 MG disintegrating tablet Take 1 tablet (4 mg  total) by mouth every 8 (eight) hours as needed for nausea. 05/23/13  Yes Kaitlyn Szekalski, PA-C  oxyCODONE-acetaminophen (PERCOCET/ROXICET) 5-325 MG per tablet Take 2 tablets by mouth every 4 (four) hours as needed for pain. 05/23/13  Yes Emilia Beck, PA-C   Physical Exam: Filed Vitals:   05/25/13 0257  BP: 158/88  Pulse: 67  Temp: 98.1 F (36.7 C)  Resp: 19     General:  NAD, resting comfortably in bed  Eyes: PEERLA EOMI  ENT: mucous membranes moist  Neck: supple w/o JVD  Cardiovascular: RRR w/o MRG  Respiratory: CTA B  Abdomen: soft, mild TTP in LUQ, nd, bs+  Skin: no rash nor  lesion  Musculoskeletal: MAE, full ROM all 4 extremities  Psychiatric: normal tone and affect  Neurologic: AAOx3, grossly non-focal   Labs on Admission:  Basic Metabolic Panel:  Recent Labs Lab 05/23/13 1640 05/24/13 2209  NA 139 135  K 4.1 3.8  CL 102 100  CO2 28 28  GLUCOSE 119* 93  BUN 15 18  CREATININE 0.71 1.11*  CALCIUM 8.9 8.8   Liver Function Tests:  Recent Labs Lab 05/23/13 1640 05/24/13 2209  AST 18 21  ALT 21 34  ALKPHOS 94 89  BILITOT 0.1* 0.3  PROT 7.3 7.2  ALBUMIN 3.7 3.7    Recent Labs Lab 05/23/13 1640 05/24/13 2209  LIPASE 138* 59   No results found for this basename: AMMONIA,  in the last 168 hours CBC:  Recent Labs Lab 05/23/13 1640 05/24/13 2209  WBC 9.5 7.4  NEUTROABS 6.9 4.7  HGB 13.8 13.4  HCT 39.7 39.4  MCV 84.1 84.7  PLT 146* 147*   Cardiac Enzymes: No results found for this basename: CKTOTAL, CKMB, CKMBINDEX, TROPONINI,  in the last 168 hours  BNP (last 3 results) No results found for this basename: PROBNP,  in the last 8760 hours CBG: No results found for this basename: GLUCAP,  in the last 168 hours  Radiological Exams on Admission: Ct Abdomen Pelvis W Contrast  05/23/2013   CLINICAL DATA:  Left lower abdominal and flank pain common nausea.  EXAM: CT ABDOMEN AND PELVIS WITH CONTRAST  TECHNIQUE: Multidetector CT imaging of the abdomen and pelvis was performed using the standard protocol following bolus administration of intravenous contrast.  CONTRAST:  OMNIPAQUE IOHEXOL 300 MG/ML  SOLN  COMPARISON:  04/06/2012  FINDINGS: Visualized lung bases clear. Vascular clips in the gallbladder fossa. Unremarkable liver, spleen and accessory splenule, adrenal glands, kidneys, pancreas, abdominal aorta. Stomach, small bowel, and colon are nondilated. Appendix is surgically absent. No significant diverticular disease. Urinary bladder physiologically distended. Previous hysterectomy. Ovaries unremarkable. There is a small amount  of free pelvic fluid, probably physiologic. No free air. Portal vein patent. No adenopathy. No hydronephrosis. Regional bones unremarkable.  IMPRESSION: No acute abnormality. Stable postop changes.   Electronically Signed   By: Oley Balm M.D.   On: 05/23/2013 21:35   Dg Abd Acute W/chest  05/25/2013   CLINICAL DATA:  Left abdominal pain. History of cholecystectomy.  EXAM: ACUTE ABDOMEN SERIES (ABDOMEN 2 VIEW & CHEST 1 VIEW)  COMPARISON:  Abdominal pelvic CT 05/23/2013.  FINDINGS: The heart size and mediastinal contours are normal. The lungs are clear. There is no pleural effusion or pneumothorax. No acute osseous findings are identified.  There is residual contrast material in the bowel related to prior CT. There is no evidence of bowel obstruction or free intraperitoneal air. Cholecystectomy clips are noted. Injection granulomas are noted in  the buttocks.  IMPRESSION: No active cardiopulmonary or abdominal process.   Electronically Signed   By: Roxy Horseman   On: 05/25/2013 01:32    EKG: Independently reviewed.  Assessment/Plan Principal Problem:   Nausea vomiting and diarrhea   1. N/V/D and abd pain - despite the mildly elevated lipase yesterday, her history is most consistent with a viral gastritis / enteritis.  This is especially true with the history of multiple close sick contacts during her trip to [place] to visit her family which is highly suggestive of infectious etiology.  IVF, repeat labs, nausea control, pain control. 2. HTN - continue home meds except for HCTZ    Code Status: Full (must indicate code status--if unknown or must be presumed, indicate so) Family Communication: Husband at bedside (indicate person spoken with, if applicable, with phone number if by telephone) Disposition Plan: Admit to to obs (indicate anticipated LOS)  Time spent: 50 min  Evan Osburn M. [facility] Pager [phone]  If 7PM-7AM, please contact night-coverage [email or web address] Password  [facility] 05/25/2013, 4:27 AM

## 2013-05-25 NOTE — Progress Notes (Signed)
Utilization review completed.  

## 2013-05-25 NOTE — ED Provider Notes (Signed)
CSN: [id number]     Arrival date & time 05/24/13  2200 History   First MD Initiated Contact with Patient 05/25/13 0005     Chief Complaint  Patient presents with  . Abdominal Pain   (Consider location/radiation/quality/duration/timing/severity/associated sxs/prior Treatment) HPI Hx per PT - LUQ ABD pain and N/V persistent since yesterday. She was evaluated here at that time with labs that showed elevated lipase and CT scan without acute abnormality per radiologist.  Her symptoms improved in the ED and she was discharged home with nausea medication and oxycodone which she has been taking.  She has increased pain and persistent vomiting tonight. No fevers, some chills. No blood in stools or emesis. She has also had some diarrhea. Pain is sharp and severe, worse with palpation and taking a deep breath. She denies any new medications otherwise, any trauma, or h/o same. She has had prior Chole, appy and hysto. No sick contacts. No Cp or SOB.   Past Medical History  Diagnosis Date  . Hyperthyroidism 10/2000    RAI treatment,  multinodular goiter  . Ectopic pregnancy 04/2006    treated with medication  . Hepatic steatosis 07/2006    on Abd CT done for pain  . 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 Preterm delivery 12/2010    x3, placental abruption, maternal htn, DM  . Hypertension   . Anemia   . ABORTION, SPONTANEOUS 08/18/2007  . IMPAIRED GLUCOSE TOLERANCE 08/18/2007  . Hypothyroidism   . Asthma    Past Surgical History  Procedure Laterality Date  . Appendectomy  1997  . Cholecystectomy  1997  . Ovarian cyst removal    . Laparoscopic lysis intestinal adhesions    . Tubal ligation    . Thyroid surgery      goiter removed  . Cesarean section      x 1  . Laparoscopic hysterectomy  04/24/2012    Procedure: HYSTERECTOMY TOTAL LAPAROSCOPIC;  Surgeon: Ok Edwards, MD;  Location: [facility];  Service: Gynecology;  Laterality: N/A;  2 1/2 hours OR time.  Dr.  Audie Box to assisting  Patient is Spanish speaking.  Thanks!!  . Abdominal hysterectomy  04/24/2012    Procedure: HYSTERECTOMY ABDOMINAL;  Surgeon: Ok Edwards, MD;  Location: [facility];  Service: Gynecology;;   Family History  Problem Relation Age of Onset  . Hypertension Father   . Stroke Father     hemorrhagic, deceased  . Diabetes Mother   . Coronary artery disease Neg Hx   . Cancer Neg Hx    History  Substance Use Topics  . Smoking status: Never Smoker   . Smokeless tobacco: Never Used  . Alcohol Use: No   OB History   Grav Para Term Preterm Abortions TAB SAB Ect Mult Living   5 1  1 3  3   1      Review of Systems  Constitutional: Negative for fever and chills.  HENT: Negative for neck pain and neck stiffness.   Eyes: Negative for pain.  Respiratory: Negative for shortness of breath.   Cardiovascular: Negative for chest pain.  Gastrointestinal: Positive for vomiting, abdominal pain and diarrhea. Negative for blood in stool.  Genitourinary: Negative for dysuria.  Musculoskeletal: Negative for back pain.  Skin: Negative for rash.  Neurological: Negative for headaches.  All other systems reviewed and are negative.    Allergies  Review of patient's allergies indicates no known allergies.  Home Medications  Current Outpatient Rx  Name  Route  Sig  Dispense  Refill  . diltiazem (CARDIZEM) 90 MG tablet   Oral   Take 90 mg by mouth 2 (two) times daily.         . hydrochlorothiazide (HYDRODIURIL) 25 MG tablet   Oral   Take 25 mg by mouth daily.         Marland Kitchen levothyroxine (SYNTHROID, LEVOTHROID) 200 MCG tablet   Oral   Take 200 mcg by mouth daily. Total dose =         . levothyroxine (SYNTHROID, LEVOTHROID) 25 MCG tablet   Oral   Take 25 mcg by mouth daily. Total dose =         . lisinopril (PRINIVIL,ZESTRIL) 5 MG tablet   Oral   Take 1 tablet (5 mg total) by mouth daily.   90 tablet   3   . ondansetron (ZOFRAN ODT) 4 MG  disintegrating tablet   Oral   Take 1 tablet (4 mg total) by mouth every 8 (eight) hours as needed for nausea.   10 tablet   0   . oxyCODONE-acetaminophen (PERCOCET/ROXICET) 5-325 MG per tablet   Oral   Take 2 tablets by mouth every 4 (four) hours as needed for pain.   12 tablet   0    BP 183/99  Pulse 88  Temp(Src) 98.3 F (36.8 C) (Oral)  Resp 18  SpO2 98%  LMP 11/12/2011 Physical Exam  Constitutional: She is oriented to person, place, and time. She appears well-developed and well-nourished.  HENT:  Head: Normocephalic and atraumatic.  Eyes: EOM are normal. Pupils are equal, round, and reactive to light. No scleral icterus.  Neck: Neck supple.  Cardiovascular: Normal rate, regular rhythm and intact distal pulses.   Pulmonary/Chest: Effort normal and breath sounds normal. No respiratory distress. She exhibits no tenderness.  Abdominal: Soft. She exhibits no distension. There is no rebound.  TTP LUQ some vol guarding. No CVAT  Musculoskeletal: Normal range of motion. She exhibits no edema.  Neurological: She is alert and oriented to person, place, and time.  Skin: Skin is warm and dry.    ED Course  Procedures (including critical care time) Labs Review Labs Reviewed  CBC WITH DIFFERENTIAL - Abnormal; Notable for the following:    Platelets 147 (*)    All other components within normal limits  COMPREHENSIVE METABOLIC PANEL - Abnormal; Notable for the following:    Creatinine, Ser 1.11 (*)    GFR calc non Af Amer 60 (*)    GFR calc Af Amer 70 (*)    All other components within normal limits  URINALYSIS, ROUTINE W REFLEX MICROSCOPIC - Abnormal; Notable for the following:    Bilirubin Urine SMALL (*)    All other components within normal limits  LIPASE, BLOOD  D-DIMER, QUANTITATIVE   Imaging Review Ct Abdomen Pelvis W Contrast  05/23/2013   CLINICAL DATA:  Left lower abdominal and flank pain common nausea.  EXAM: CT ABDOMEN AND PELVIS WITH CONTRAST  TECHNIQUE:  Multidetector CT imaging of the abdomen and pelvis was performed using the standard protocol following bolus administration of intravenous contrast.  CONTRAST:  OMNIPAQUE IOHEXOL 300 MG/ML  SOLN  COMPARISON:  04/06/2012  FINDINGS: Visualized lung bases clear. Vascular clips in the gallbladder fossa. Unremarkable liver, spleen and accessory splenule, adrenal glands, kidneys, pancreas, abdominal aorta. Stomach, small bowel, and colon are nondilated. Appendix is surgically absent. No significant diverticular disease. Urinary bladder physiologically distended.  Previous hysterectomy. Ovaries unremarkable. There is a small amount of free pelvic fluid, probably physiologic. No free air. Portal vein patent. No adenopathy. No hydronephrosis. Regional bones unremarkable.  IMPRESSION: No acute abnormality. Stable postop changes.   Electronically Signed   By: Oley Balm M.D.   On: 05/23/2013 21:35   IVFs, IV zofran Records reviewed - including Ct scan as above from yesterday.   12:40 AM d/w Dr Julian Reil, plan admit  MDM  DX: persistent vomiting and ABD pain Labs reviewed, no UTI IVFs, IV narcotics pain control MED admit   Sunnie Nielsen, MD 05/25/13 0041

## 2013-05-25 NOTE — ED Provider Notes (Signed)
Medical screening examination/treatment/procedure(s) were performed by non-physician practitioner and as supervising physician I was immediately available for consultation/collaboration.   Enid Skeens, MD 05/25/13 2114

## 2013-05-26 ENCOUNTER — Other Ambulatory Visit: Payer: Self-pay

## 2013-05-26 DIAGNOSIS — E039 Hypothyroidism, unspecified: Secondary | ICD-10-CM

## 2013-05-26 LAB — BASIC METABOLIC PANEL
CO2: 24 mEq/L (ref 19–32)
Creatinine, Ser: 0.81 mg/dL (ref 0.50–1.10)
GFR calc Af Amer: >90 mL/min (ref >90)
GFR calc non Af Amer: 88 mL/min — ABNORMAL LOW (ref >90)
Glucose, Bld: 95 mg/dL (ref 70–99)
Sodium: 140 mEq/L (ref 135–145)

## 2013-05-26 MED ORDER — INFLUENZA VAC SPLIT QUAD 0.5 ML IM SUSP
0.5000 mL | INTRAMUSCULAR | Status: DC
  Filled 2013-05-26: qty 0.5

## 2013-05-26 MED ORDER — INFLUENZA VAC SPLIT QUAD 0.5 ML IM SUSP
0.5000 mL | Freq: Once | INTRAMUSCULAR | Status: AC
  Administered 2013-05-26: 0.5 mL via INTRAMUSCULAR
  Filled 2013-05-26: qty 0.5

## 2013-05-26 MED ORDER — PROMETHAZINE HCL 12.5 MG PO TABS: 12.5000 mg | ORAL_TABLET | Freq: Four times a day (QID) | ORAL | Status: DC | PRN

## 2013-05-26 NOTE — Discharge Summary (Signed)
Physician Discharge Summary  [name] ZOX:[id number] DOB: 09/25/1970 DOA: 05/24/2013  PCP: Eustaquio Boyden, MD  Admit date: 05/24/2013 Discharge date: 05/26/2013  Time spent: <30 min   Discharge Diagnoses:  Viral gastroenteritis  Discharge Condition: stable  There were no vitals filed for this visit.  Hospital Course:  HPI: [name] is a 42 y.o. female who presents to the ED with LUQ abd pain, N/V/D. This onset yesterday and has been persistent since that time. Seen in ED yesterday for same, was discharged home but despite taking meds symptoms persisted so she returned to the ED. There was mildly elevated lipase yesterday although this has normalized on todays exam. CT abd yesterday was negative. Of note these symptoms occur in the context of a recent trip to visit her family in [place] 3 days ago. It seems that multiple members of her family that she was visiting, including 2 brothers, were sick with identical symptoms  Started on IVF, antiemetics and pain medication.  Diet advanced.  Symptoms resolved prior to discharge.  Discharge Exam: Filed Vitals:   05/26/13 1108  BP: 127/72  Pulse:   Temp:   Resp:     General: nontoxic Abd:  S, nt, nd  Discharge Instructions  Discharge Orders   Future Orders Complete By Expires   Activity as tolerated - No restrictions  As directed    Diet - low sodium heart healthy  As directed        Medication List         diltiazem 90 MG tablet  Commonly known as:  CARDIZEM  Take 90 mg by mouth 2 (two) times daily.     hydrochlorothiazide 25 MG tablet  Commonly known as:  HYDRODIURIL  Take 25 mg by mouth daily.     levothyroxine 25 MCG tablet  Commonly known as:  SYNTHROID, LEVOTHROID  Take 25 mcg by mouth daily. Total dose =     levothyroxine 200 MCG tablet  Commonly known as:  SYNTHROID, LEVOTHROID  Take 200 mcg by mouth daily. Total dose =     lisinopril 5 MG tablet  Commonly known as:  PRINIVIL,ZESTRIL   Take 1 tablet (5 mg total) by mouth daily.     ondansetron 4 MG disintegrating tablet  Commonly known as:  ZOFRAN ODT  Take 1 tablet (4 mg total) by mouth every 8 (eight) hours as needed for nausea.     oxyCODONE-acetaminophen 5-325 MG per tablet  Commonly known as:  PERCOCET/ROXICET  Take 2 tablets by mouth every 4 (four) hours as needed for pain.     promethazine 12.5 MG tablet  Commonly known as:  PHENERGAN  Take 1 tablet (12.5 mg total) by mouth every 6 (six) hours as needed for nausea.       No Known Allergies     Follow-up Information   Follow up with Eustaquio Boyden, MD. (If symptoms worsen)    Specialty:  Family Medicine   Contact information:   [address] [place] [address] [phone]        The results of significant diagnostics from this hospitalization (including imaging, microbiology, ancillary and laboratory) are listed below for reference.    Significant Diagnostic Studies: Ct Abdomen Pelvis W Contrast  05/23/2013   CLINICAL DATA:  Left lower abdominal and flank pain common nausea.  EXAM: CT ABDOMEN AND PELVIS WITH CONTRAST  TECHNIQUE: Multidetector CT imaging of the abdomen and pelvis was performed using the standard protocol following bolus administration of  intravenous contrast.  CONTRAST:  OMNIPAQUE IOHEXOL 300 MG/ML  SOLN  COMPARISON:  04/06/2012  FINDINGS: Visualized lung bases clear. Vascular clips in the gallbladder fossa. Unremarkable liver, spleen and accessory splenule, adrenal glands, kidneys, pancreas, abdominal aorta. Stomach, small bowel, and colon are nondilated. Appendix is surgically absent. No significant diverticular disease. Urinary bladder physiologically distended. Previous hysterectomy. Ovaries unremarkable. There is a small amount of free pelvic fluid, probably physiologic. No free air. Portal vein patent. No adenopathy. No hydronephrosis. Regional bones unremarkable.  IMPRESSION: No acute abnormality. Stable postop  changes.   Electronically Signed   By: Oley Balm M.D.   On: 05/23/2013 21:35   Dg Abd Acute W/chest  05/25/2013   CLINICAL DATA:  Left abdominal pain. History of cholecystectomy.  EXAM: ACUTE ABDOMEN SERIES (ABDOMEN 2 VIEW & CHEST 1 VIEW)  COMPARISON:  Abdominal pelvic CT 05/23/2013.  FINDINGS: The heart size and mediastinal contours are normal. The lungs are clear. There is no pleural effusion or pneumothorax. No acute osseous findings are identified.  There is residual contrast material in the bowel related to prior CT. There is no evidence of bowel obstruction or free intraperitoneal air. Cholecystectomy clips are noted. Injection granulomas are noted in the buttocks.  IMPRESSION: No active cardiopulmonary or abdominal process.   Electronically Signed   By: Roxy Horseman   On: 05/25/2013 01:32    Microbiology: No results found for this or any previous visit (from the past 240 hour(s)).   Labs: Basic Metabolic Panel:  Recent Labs Lab 05/23/13 1640 05/24/13 2209 05/25/13 0624 05/25/13 1518 05/26/13 0520  NA 139 135 135  --  140  K 4.1 3.8 3.2*  --  3.8  CL 102 100 102  --  107  CO2 28 28 24   --  24  GLUCOSE 119* 93 98  --  95  BUN 15 18 15   --  11  CREATININE 0.71 1.11* 0.81  --  0.81  CALCIUM 8.9 8.8 8.4  --  8.4  MG  --   --   --  1.9  --    Liver Function Tests:  Recent Labs Lab 05/23/13 1640 05/24/13 2209  AST 18 21  ALT 21 34  ALKPHOS 94 89  BILITOT 0.1* 0.3  PROT 7.3 7.2  ALBUMIN 3.7 3.7    Recent Labs Lab 05/23/13 1640 05/24/13 2209  LIPASE 138* 59   No results found for this basename: AMMONIA,  in the last 168 hours CBC:  Recent Labs Lab 05/23/13 1640 05/24/13 2209 05/25/13 0624  WBC 9.5 7.4 6.8  NEUTROABS 6.9 4.7  --   HGB 13.8 13.4 12.8  HCT 39.7 39.4 39.1  MCV 84.1 84.7 85.7  PLT 146* 147* 132*   Cardiac Enzymes: Signed:  Demaya Hardge L  [facility] 05/26/2013, 11:11 AM

## 2013-05-26 NOTE — Telephone Encounter (Signed)
Pt's husband came by office requesting refill HCTZ to [facility]; pt had been out of med for ? Days prior to admission on 05/24/13. Pt received HCTZ 25 mg one daily until discharged from [facility]. pts husband said hospital told pt to continue HCTZ but did not give refill.Please advise. Mr Borawski request cb if refilled.

## 2013-05-26 NOTE — Telephone Encounter (Signed)
Ok to refill until she has follow up appt with Dr. Reece Agar.  Please make sure she keeps appt.

## 2013-05-26 NOTE — Progress Notes (Signed)
Pt's husband states that pt came to ED on 9-21 and was told she had pancreatitis, that she came back the next day and was told it was a GI virus. Says MD said it was a GI virus b/c pt's two brothers in [place] had similar s/s thought to be GI related by  pt's admitting MD here. Husband says MD misunderstood about diagnosis of pt's brothers - that each of them has had recent pancreatitis and not GI virus.  Husband and pt believes her problem was her pancreas also and not a GI bug.

## 2013-05-27 MED ORDER — INFLUENZA VAC SPLIT QUAD 0.5 ML IM SUSP: 0.5000 mL | Freq: Once | INTRAMUSCULAR | Status: DC

## 2013-05-27 MED ORDER — HYDROCHLOROTHIAZIDE 25 MG PO TABS: ORAL_TABLET | ORAL | Status: DC

## 2013-05-27 NOTE — ED Provider Notes (Signed)
Medical screening examination/treatment/procedure(s) were performed by resident physician or non-physician practitioner and as supervising physician I was immediately available for consultation/collaboration.   KINDL,JAMES DOUGLAS MD.   James D Kindl, MD 05/27/13 1455 

## 2013-05-27 NOTE — ED Notes (Signed)
Review of UCC lbs

## 2013-06-05 ENCOUNTER — Ambulatory Visit (INDEPENDENT_AMBULATORY_CARE_PROVIDER_SITE_OTHER): Payer: 59 | Admitting: Family Medicine

## 2013-06-05 ENCOUNTER — Encounter: Payer: Self-pay | Admitting: Family Medicine

## 2013-06-05 VITALS — BP 154/102 | HR 78 | Temp 98.3°F | Wt 162.0 lb

## 2013-06-05 DIAGNOSIS — E785 Hyperlipidemia, unspecified: Secondary | ICD-10-CM

## 2013-06-05 DIAGNOSIS — A088 Other specified intestinal infections: Secondary | ICD-10-CM

## 2013-06-05 DIAGNOSIS — I10 Essential (primary) hypertension: Secondary | ICD-10-CM

## 2013-06-05 DIAGNOSIS — A084 Viral intestinal infection, unspecified: Secondary | ICD-10-CM

## 2013-06-05 DIAGNOSIS — E039 Hypothyroidism, unspecified: Secondary | ICD-10-CM

## 2013-06-05 MED ORDER — HYDROCHLOROTHIAZIDE 25 MG PO TABS: ORAL_TABLET | ORAL | Status: DC

## 2013-06-05 MED ORDER — LEVOTHYROXINE SODIUM 200 MCG PO TABS: 200.0000 ug | ORAL_TABLET | Freq: Every day | ORAL | Status: DC

## 2013-06-05 MED ORDER — LISINOPRIL 5 MG PO TABS: 5.0000 mg | ORAL_TABLET | Freq: Every day | ORAL | Status: DC

## 2013-06-05 MED ORDER — LEVOTHYROXINE SODIUM 25 MCG PO TABS: 25.0000 ug | ORAL_TABLET | Freq: Every day | ORAL | Status: DC

## 2013-06-05 MED ORDER — DILTIAZEM HCL 90 MG PO TABS: 90.0000 mg | ORAL_TABLET | Freq: Two times a day (BID) | ORAL | Status: DC

## 2013-06-05 NOTE — Assessment & Plan Note (Signed)
Slowly improving, however with persistent GI upset.  rec continue phenergan PRN, and start align probiotic.

## 2013-06-05 NOTE — Assessment & Plan Note (Signed)
Return fasting for chol check. H/o L globus pallidus CVA - will need tight chol control.  Consider ASA.

## 2013-06-05 NOTE — Assessment & Plan Note (Signed)
Elevated today however pt endorses routine monitoring at home and states well controlled there.  No changes today. Pt aware to notify me if persistently elevated >140/90.

## 2013-06-05 NOTE — Patient Instructions (Signed)
siga phenergan (promethazine) como necesite para nausea - se debe ir mejorando diariamente. trate align probiotico diario - o 1 yogur diario - para ayudar a regularizar intestinos. monitoree presion en casa. regresar la semana entrante para reviso de [employer]. Gusto verla hoy. regrese en 6 mese para revisar presion y tyroide.

## 2013-06-05 NOTE — Assessment & Plan Note (Signed)
Recheck TSH when returns for fasting blood work.

## 2013-06-05 NOTE — Progress Notes (Signed)
Subjective:    Patient ID: [name], female    DOB: [birth year], 42 y.o.   MRN: [id number]  HPI CC: hosp f/u  [name] presents today as hospital f/u.  All records reviewed.  Actually presented to ER x2 prior to admission for presumed viral gastroenteritis.  Clarified - did NOT travel to [place].  Did have brother who had pancreatitis 8 months ago.  No sick contacts at home.  No new foods.  Uses well water with filter. Since discharge, intermittent nausea and diarrhea.  Feels bloated with foods. Currently avoiding soda, spicy foods, greasy foods. No significant EtOH use   HTN - compliant with lisinopril, hctz, and diltiazem.  Elevated today.  Blood pressure at home (she checks) has been normal. Not fasting today.  BP Readings from Last 3 Encounters:  06/05/13 154/102  05/26/13 127/72  05/23/13 157/101   Body mass index is 29.4 kg/(m^2).  Admit date: 05/24/2013  Discharge date: 05/26/2013   Discharge Diagnoses:  Viral gastroenteritis  Discharge Condition: stable  There were no vitals filed for this visit.  Hospital Course:  HPI: [name] is a 42 y.o. female who presents to the ED with LUQ abd pain, N/V/D. This onset yesterday and has been persistent since that time. Seen in ED yesterday for same, was discharged home but despite taking meds symptoms persisted so she returned to the ED. There was mildly elevated lipase yesterday although this has normalized on todays exam. CT abd yesterday was negative. Of note these symptoms occur in the context of a recent trip to visit her family in [place] 3 days ago. It seems that multiple members of her family that she was visiting, including 2 brothers, were sick with identical symptoms  Started on IVF, antiemetics and pain medication. Diet advanced. Symptoms resolved prior to discharge.   EXAM:  CT ABDOMEN AND PELVIS WITH CONTRAST  TECHNIQUE:  Multidetector CT imaging of the abdomen and pelvis was performed using the standard protocol  following bolus administration of intravenous contrast.  CONTRAST: OMNIPAQUE IOHEXOL 300 MG/ML SOLN  COMPARISON: 04/06/2012  FINDINGS:  Visualized lung bases clear. Vascular clips in the gallbladder fossa. Unremarkable liver, spleen and accessory splenule, adrenal glands, kidneys, pancreas, abdominal aorta. Stomach, small bowel, and colon are nondilated. Appendix is surgically absent. No significant diverticular disease. Urinary bladder physiologically distended. Previous hysterectomy. Ovaries unremarkable. There is a small amount of free pelvic fluid, probably physiologic. No free air. Portal vein patent. No adenopathy. No hydronephrosis. Regional bones unremarkable.  IMPRESSION:  No acute abnormality. Stable postop changes  Medications and allergies reviewed and updated in chart.  Past histories reviewed and updated if relevant as below. Patient Active Problem List   Diagnosis Date Noted  . Nausea vomiting and diarrhea 05/25/2013  . Low back pain 07/07/2012  . Numbness and tingling in right hand 06/17/2012  . Post-operative pain 05/07/2012  . Left flank pain 05/07/2012  . Female pelvic pain 05/07/2012  . Anemia 04/03/2011  . MIGRAINE HEADACHE 05/12/2007  . OCCLUSION, CEREBRAL ARTERY NOS W/INFARCTION 12/12/2006  . Skin rash 12/11/2006  . HYPOTHYROIDISM, UNSPECIFIED 10/31/2006  . HYPERLIPIDEMIA 10/31/2006  . Overweight (BMI 25.0-29.9) 10/31/2006  . HYPERTENSION, BENIGN SYSTEMIC 10/31/2006  . ASTHMA, PERSISTENT 10/31/2006  . UMBILICAL HERNIA 10/31/2006  . MENSTRUAL CYCLE, IRREGULAR 10/31/2006   Past Medical History  Diagnosis Date  . Hyperthyroidism 10/2000    RAI treatment,  multinodular goiter  . Ectopic pregnancy 04/2006    treated with medication  . Hepatic steatosis 07/2006  on Abd CT done for pain  . 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 Preterm delivery 12/2010    x3, placental abruption, maternal htn, DM  . Hypertension   .  Anemia   . ABORTION, SPONTANEOUS 08/18/2007  . IMPAIRED GLUCOSE TOLERANCE 08/18/2007  . Hypothyroidism   . Asthma    Past Surgical History  Procedure Laterality Date  . Appendectomy  1997  . Cholecystectomy  1997  . Ovarian cyst removal    . Laparoscopic lysis intestinal adhesions    . Tubal ligation    . Thyroid surgery      goiter removed  . Cesarean section      x 1  . Laparoscopic hysterectomy  04/24/2012    Procedure: HYSTERECTOMY TOTAL LAPAROSCOPIC;  Surgeon: Ok Edwards, MD;  Location: [facility];  Service: Gynecology;  Laterality: N/A;  2 1/2 hours OR time.  Dr. Audie Box to assisting  Patient is Spanish speaking.  Thanks!!  . Abdominal hysterectomy  04/24/2012    Procedure: HYSTERECTOMY ABDOMINAL;  Surgeon: Ok Edwards, MD;  Location: [facility];  Service: Gynecology;;   History  Substance Use Topics  . Smoking status: Never Smoker   . Smokeless tobacco: Never Used  . Alcohol Use: No   Family History  Problem Relation Age of Onset  . Hypertension Father   . Stroke Father     hemorrhagic, deceased  . Diabetes Mother   . Coronary artery disease Neg Hx   . Cancer Neg Hx    No Known Allergies Current Outpatient Prescriptions on File Prior to Visit  Medication Sig Dispense Refill  . diltiazem (CARDIZEM) 90 MG tablet Take 90 mg by mouth 2 (two) times daily.      . hydrochlorothiazide (HYDRODIURIL) 25 MG tablet Take one tablet by mouth daily. **Keep scheduled follow up appointment**  30 tablet  0  . levothyroxine (SYNTHROID, LEVOTHROID) 200 MCG tablet Take 200 mcg by mouth daily. Total dose =      . levothyroxine (SYNTHROID, LEVOTHROID) 25 MCG tablet Take 25 mcg by mouth daily. Total dose =      . lisinopril (PRINIVIL,ZESTRIL) 5 MG tablet Take 1 tablet (5 mg total) by mouth daily.  90 tablet  3  . ondansetron (ZOFRAN ODT) 4 MG disintegrating tablet Take 1 tablet (4 mg total) by mouth every 8 (eight) hours as needed for nausea.  10 tablet  0  .  oxyCODONE-acetaminophen (PERCOCET/ROXICET) 5-325 MG per tablet Take 2 tablets by mouth every 4 (four) hours as needed for pain.  12 tablet  0  . promethazine (PHENERGAN) 12.5 MG tablet Take 1 tablet (12.5 mg total) by mouth every 6 (six) hours as needed for nausea.  10 tablet  0   No current facility-administered medications on file prior to visit.     Review of Systems Per HPI    Objective:   Physical Exam  Nursing note and vitals reviewed. Constitutional: She appears well-developed and well-nourished. No distress.  HENT:  Mouth/Throat: Oropharynx is clear and moist. No oropharyngeal exudate.  Cardiovascular: Normal rate, regular rhythm, normal heart sounds and intact distal pulses.   No murmur heard. Pulmonary/Chest: Effort normal and breath sounds normal. No respiratory distress. She has no wheezes. She has no rales.  Abdominal: Soft. Bowel sounds are normal. She exhibits no distension and no mass. There is no tenderness. There is no rebound and no guarding.  Minimal LUQ discomfort  Musculoskeletal: She exhibits no  edema.  Skin: She is not diaphoretic.  Psychiatric: She has a normal mood and affect.       Assessment & Plan:

## 2013-06-17 ENCOUNTER — Other Ambulatory Visit (INDEPENDENT_AMBULATORY_CARE_PROVIDER_SITE_OTHER): Payer: 59

## 2013-06-17 ENCOUNTER — Other Ambulatory Visit: Payer: Self-pay | Admitting: Family Medicine

## 2013-06-17 DIAGNOSIS — I10 Essential (primary) hypertension: Secondary | ICD-10-CM

## 2013-06-17 DIAGNOSIS — E039 Hypothyroidism, unspecified: Secondary | ICD-10-CM

## 2013-06-17 DIAGNOSIS — E785 Hyperlipidemia, unspecified: Secondary | ICD-10-CM

## 2013-06-17 LAB — BASIC METABOLIC PANEL
CO2: 28 mEq/L (ref 19–32)
Calcium: 9.2 mg/dL (ref 8.4–10.5)
Creatinine, Ser: 0.9 mg/dL (ref 0.4–1.2)
GFR: 73.71 mL/min (ref >60.00)
Potassium: 3.9 mEq/L (ref 3.5–5.1)
Sodium: 136 mEq/L (ref 135–145)

## 2013-06-17 LAB — LIPID PANEL
Cholesterol: 276 mg/dL — ABNORMAL HIGH (ref 0–200)
HDL: 37 mg/dL — ABNORMAL LOW (ref >39.00)
Triglycerides: 531 mg/dL — ABNORMAL HIGH (ref 0.0–149.0)

## 2013-06-17 LAB — TSH: TSH: >100 u[IU]/mL — ABNORMAL HIGH (ref 0.35–5.50)

## 2013-06-17 MED ORDER — FENOFIBRATE 145 MG PO TABS: 145.0000 mg | ORAL_TABLET | Freq: Every day | ORAL | Status: DC

## 2013-06-18 ENCOUNTER — Ambulatory Visit: Payer: 59

## 2013-06-18 DIAGNOSIS — R7989 Other specified abnormal findings of blood chemistry: Secondary | ICD-10-CM

## 2013-06-18 LAB — HEPATIC FUNCTION PANEL
Bilirubin, Direct: 0 mg/dL (ref 0.0–0.3)
Total Bilirubin: 0.3 mg/dL (ref 0.3–1.2)
Total Protein: 7.8 g/dL (ref 6.0–8.3)

## 2013-06-30 ENCOUNTER — Encounter: Payer: Self-pay | Admitting: *Deleted

## 2013-07-03 ENCOUNTER — Telehealth: Payer: Self-pay

## 2013-07-03 MED ORDER — AMLODIPINE BESYLATE 10 MG PO TABS: 10.0000 mg | ORAL_TABLET | Freq: Every day | ORAL | Status: DC

## 2013-07-03 NOTE — Telephone Encounter (Signed)
Raynelle Fanning interpreter left v/m that pt is requesting a less expensive BP med; pt presently paying over $100.00 for BP med.Please advise.[facility].

## 2013-07-03 NOTE — Telephone Encounter (Signed)
i imagine diltiazem is more expensive of the medications.  We could transition to amlodipine 10mg  daily instead if diltiazem Let's stop diltiazem, start amlodipine 5mg  daily (1/2 tablet) for 5 days then increase to 10mg  daily (whole tablet) Return to see me in 1 month after starting new meds, sooner if blood pressure consistently elevated at home. Could also be from levothyroxine and tricor as those are expensive as well.

## 2013-07-06 ENCOUNTER — Telehealth: Payer: Self-pay | Admitting: *Deleted

## 2013-07-06 MED ORDER — FENOFIBRATE MICRONIZED 134 MG PO CAPS: 134.0000 mg | ORAL_CAPSULE | Freq: Every day | ORAL | Status: DC

## 2013-07-06 NOTE — Telephone Encounter (Signed)
Pharmacy requested prior auth on fenofibrate 145mg  , per ins co fenofibrate 145 mg isn't covered under pt's formulary (they only cover certain strengths), but they do cover fenofibrate 160 mg, 150 mg, 134 mg, 130 mg, 50 mg, 43 mg and  fenofribric 105 mg, 35 mg.

## 2013-07-06 NOTE — Telephone Encounter (Signed)
Have sent in covered alternative - 134 mg.

## 2013-07-06 NOTE — Telephone Encounter (Signed)
Patient's husband notified (patient doesn't speak [place]). Follow up scheduled and she will come in sooner if BP stays elevated.

## 2013-08-07 ENCOUNTER — Ambulatory Visit: Payer: 59 | Admitting: Family Medicine

## 2013-08-07 DIAGNOSIS — Z0289 Encounter for other administrative examinations: Secondary | ICD-10-CM

## 2013-09-26 ENCOUNTER — Encounter (HOSPITAL_COMMUNITY): Payer: Self-pay | Admitting: Emergency Medicine

## 2013-09-26 ENCOUNTER — Emergency Department (HOSPITAL_COMMUNITY)
Admission: EM | Admit: 2013-09-26 | Discharge: 2013-09-27 | Disposition: A | Discharge status: Home / Self Care | Payer: 59 | Attending: Emergency Medicine | Admitting: Emergency Medicine

## 2013-09-26 DIAGNOSIS — Z9089 Acquired absence of other organs: Secondary | ICD-10-CM | POA: Insufficient documentation

## 2013-09-26 DIAGNOSIS — I10 Essential (primary) hypertension: Secondary | ICD-10-CM | POA: Insufficient documentation

## 2013-09-26 DIAGNOSIS — Z8673 Personal history of transient ischemic attack (TIA), and cerebral infarction without residual deficits: Secondary | ICD-10-CM | POA: Insufficient documentation

## 2013-09-26 DIAGNOSIS — J45909 Unspecified asthma, uncomplicated: Secondary | ICD-10-CM | POA: Insufficient documentation

## 2013-09-26 DIAGNOSIS — Z79899 Other long term (current) drug therapy: Secondary | ICD-10-CM | POA: Insufficient documentation

## 2013-09-26 DIAGNOSIS — IMO0001 Reserved for inherently not codable concepts without codable children: Secondary | ICD-10-CM | POA: Insufficient documentation

## 2013-09-26 DIAGNOSIS — Z8719 Personal history of other diseases of the digestive system: Secondary | ICD-10-CM | POA: Insufficient documentation

## 2013-09-26 DIAGNOSIS — R112 Nausea with vomiting, unspecified: Secondary | ICD-10-CM

## 2013-09-26 DIAGNOSIS — E039 Hypothyroidism, unspecified: Secondary | ICD-10-CM | POA: Insufficient documentation

## 2013-09-26 DIAGNOSIS — Z862 Personal history of diseases of the blood and blood-forming organs and certain disorders involving the immune mechanism: Secondary | ICD-10-CM | POA: Insufficient documentation

## 2013-09-26 DIAGNOSIS — R109 Unspecified abdominal pain: Secondary | ICD-10-CM | POA: Insufficient documentation

## 2013-09-26 DIAGNOSIS — R197 Diarrhea, unspecified: Secondary | ICD-10-CM | POA: Insufficient documentation

## 2013-09-26 DIAGNOSIS — Z90710 Acquired absence of both cervix and uterus: Secondary | ICD-10-CM | POA: Insufficient documentation

## 2013-09-26 DIAGNOSIS — Z9851 Tubal ligation status: Secondary | ICD-10-CM | POA: Insufficient documentation

## 2013-09-26 LAB — CBC WITH DIFFERENTIAL/PLATELET
BASOS ABS: 0 10*3/uL (ref 0.0–0.1)
BASOS PCT: 0 % (ref 0–1)
Eosinophils Absolute: 0 10*3/uL (ref 0.0–0.7)
Eosinophils Relative: 0 % (ref 0–5)
HCT: 43.4 % (ref 36.0–46.0)
Hemoglobin: 14.9 g/dL (ref 12.0–15.0)
LYMPHS PCT: 10 % — AB (ref 12–46)
Lymphs Abs: 0.9 10*3/uL (ref 0.7–4.0)
MCH: 29.4 pg (ref 26.0–34.0)
MCHC: 34.3 g/dL (ref 30.0–36.0)
MCV: 85.6 fL (ref 78.0–100.0)
Monocytes Absolute: 0.4 10*3/uL (ref 0.1–1.0)
Monocytes Relative: 5 % (ref 3–12)
NEUTROS PCT: 86 % — AB (ref 43–77)
Neutro Abs: 7.7 10*3/uL (ref 1.7–7.7)
Platelets: 137 10*3/uL — ABNORMAL LOW (ref 150–400)
RBC: 5.07 MIL/uL (ref 3.87–5.11)
RDW: 13 % (ref 11.5–15.5)
WBC: 9.1 10*3/uL (ref 4.0–10.5)

## 2013-09-26 LAB — COMPREHENSIVE METABOLIC PANEL
ALBUMIN: 3.7 g/dL (ref 3.5–5.2)
ALT: 25 U/L (ref 0–35)
AST: 19 U/L (ref 0–37)
Alkaline Phosphatase: 102 U/L (ref 39–117)
BUN: 16 mg/dL (ref 6–23)
CALCIUM: 8.9 mg/dL (ref 8.4–10.5)
CHLORIDE: 99 meq/L (ref 96–112)
CO2: 24 mEq/L (ref 19–32)
CREATININE: 0.68 mg/dL (ref 0.50–1.10)
GFR calc Af Amer: >90 mL/min (ref >90)
Glucose, Bld: 111 mg/dL — ABNORMAL HIGH (ref 70–99)
Potassium: 3.6 mEq/L — ABNORMAL LOW (ref 3.7–5.3)
Sodium: 137 mEq/L (ref 137–147)
Total Bilirubin: 0.3 mg/dL (ref 0.3–1.2)
Total Protein: 7.5 g/dL (ref 6.0–8.3)

## 2013-09-26 MED ORDER — SODIUM CHLORIDE 0.9 % IV SOLN
1000.0000 mL | Freq: Once | INTRAVENOUS | Status: AC
  Administered 2013-09-26: 1000 mL via INTRAVENOUS

## 2013-09-26 MED ORDER — ONDANSETRON HCL 4 MG/2ML IJ SOLN
4.0000 mg | Freq: Once | INTRAMUSCULAR | Status: AC
  Administered 2013-09-26: 4 mg via INTRAVENOUS
  Filled 2013-09-26: qty 2

## 2013-09-26 MED ORDER — ONDANSETRON 4 MG PO TBDP
8.0000 mg | ORAL_TABLET | Freq: Once | ORAL | Status: AC
  Administered 2013-09-26: 8 mg via ORAL
  Filled 2013-09-26: qty 2

## 2013-09-26 MED ORDER — SODIUM CHLORIDE 0.9 % IV SOLN
1000.0000 mL | INTRAVENOUS | Status: DC
Start: 2013-09-26 — End: 2013-09-27

## 2013-09-26 MED ORDER — LOPERAMIDE HCL 2 MG PO CAPS
4.0000 mg | ORAL_CAPSULE | Freq: Once | ORAL | Status: AC
  Administered 2013-09-26: 4 mg via ORAL
  Filled 2013-09-26: qty 2

## 2013-09-26 NOTE — ED Notes (Addendum)
Presents with nausea, vomiting and diarrhea that began at 3 am associated with generalized body aches and abdominal cramping.  deies blood in stool or blood in emesis. Unable to hold fluids down.  Pain is made worse with touch.

## 2013-09-26 NOTE — ED Provider Notes (Signed)
CSN: [id number]     Arrival date & time 09/26/13  2115 History   First MD Initiated Contact with Patient 09/26/13 2327     Chief Complaint  Patient presents with  . Emesis   (Consider location/radiation/quality/duration/timing/severity/associated sxs/prior Treatment) Patient is a 43 y.o. female presenting with vomiting. The history is provided by the patient.  Emesis She had onset this morning of crampy abdominal pain, vomiting, diarrhea. There's been no blood in stool or emesis appears not to hold anything in her stomach. There's been no actual fevers but she has had chills and sweats. She's also complaining of some generalized myalgias. Pain is moderate and she rates it at 4/10. She has not done anything to try and treat her symptoms. She has had sick contacts with similar illnesses.  Past Medical History  Diagnosis Date  . Hyperthyroidism 10/2000    RAI treatment,  multinodular goiter  . Ectopic pregnancy 04/2006    treated with medication  . Hepatic steatosis 07/2006    on Abd CT done for pain  . 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 Preterm delivery 12/2010    x3, placental abruption, maternal htn, DM  . Hypertension   . Anemia   . ABORTION, SPONTANEOUS 08/18/2007  . IMPAIRED GLUCOSE TOLERANCE 08/18/2007  . Hypothyroidism   . Asthma    Past Surgical History  Procedure Laterality Date  . Appendectomy  1997  . Cholecystectomy  1997  . Ovarian cyst removal    . Laparoscopic lysis intestinal adhesions    . Tubal ligation    . Thyroid surgery      goiter removed  . Cesarean section      x 1  . Laparoscopic hysterectomy  04/24/2012    Procedure: HYSTERECTOMY TOTAL LAPAROSCOPIC;  Surgeon: Terrance Mass, MD;  Location: [facility];  Service: Gynecology;  Laterality: N/A;  2 1/2 hours OR time.  Dr. Phineas Real to assisting  Patient is Spanish speaking.  Thanks!!  . Abdominal hysterectomy  04/24/2012    Procedure: HYSTERECTOMY ABDOMINAL;  Surgeon:  Terrance Mass, MD;  Location: [facility];  Service: Gynecology;;   Family History  Problem Relation Age of Onset  . Hypertension Father   . Stroke Father     hemorrhagic, deceased  . Diabetes Mother   . Coronary artery disease Neg Hx   . Cancer Neg Hx    History  Substance Use Topics  . Smoking status: Never Smoker   . Smokeless tobacco: Never Used  . Alcohol Use: No   OB History   Grav Para Term Preterm Abortions TAB SAB Ect Mult Living   5 1  1 3  3   1      Review of Systems  Gastrointestinal: Positive for vomiting.  All other systems reviewed and are negative.    Allergies  Review of patient's allergies indicates no known allergies.  Home Medications   Current Outpatient Rx  Name  Route  Sig  Dispense  Refill  . amLODipine (NORVASC) 10 MG tablet   Oral   Take 1 tablet (10 mg total) by mouth daily.   30 tablet   3   . fenofibrate micronized (LOFIBRA) 134 MG capsule   Oral   Take 1 capsule (134 mg total) by mouth daily before breakfast.   30 capsule   11   . hydrochlorothiazide (HYDRODIURIL) 25 MG tablet      Take one tablet by mouth daily.   90 tablet  3   . levothyroxine (SYNTHROID, LEVOTHROID) 200 MCG tablet   Oral   Take 1 tablet (200 mcg total) by mouth daily. Total dose = 26mcg   90 tablet   3   . levothyroxine (SYNTHROID, LEVOTHROID) 25 MCG tablet   Oral   Take 1 tablet (25 mcg total) by mouth daily. Total dose = 228mcg   90 tablet   3   . lisinopril (PRINIVIL,ZESTRIL) 5 MG tablet   Oral   Take 1 tablet (5 mg total) by mouth daily.   90 tablet   3   . lovastatin (MEVACOR) 20 MG tablet   Oral   Take 1 tablet by mouth daily.          BP 178/108  Pulse 111  Temp(Src) 98 F (36.7 C) (Oral)  Resp 16  Wt 159 lb 2 oz (72.179 kg)  SpO2 98%  LMP 11/12/2011 Physical Exam  Nursing note and vitals reviewed.  43 year old female, resting comfortably and in no acute distress. Vital signs are significant for hypertension with blood  pressure 170/108, and tachycardia with heart rate 111. Oxygen saturation is 98%, which is normal. Head is normocephalic and atraumatic. PERRLA, EOMI. Oropharynx is clear. Neck is nontender and supple without adenopathy or JVD. Back is nontender and there is no CVA tenderness. Lungs are clear without rales, wheezes, or rhonchi. Chest is nontender. Heart has regular rate and rhythm without murmur. Abdomen is soft, flat, with mild tenderness across the lower abdomen. There is no rebound or guarding. There are no masses or hepatosplenomegaly and peristalsis is hypoactive. Extremities have no cyanosis or edema, full range of motion is present. Skin is warm and dry without rash. Neurologic: Mental status is normal, cranial nerves are intact, there are no motor or sensory deficits.  ED Course  Procedures (including critical care time) Labs Review Results for orders placed during the hospital encounter of 09/26/13  CBC WITH DIFFERENTIAL      Result Value Range   WBC 9.1  4.0 - 10.5 K/uL   RBC 5.07  3.87 - 5.11 MIL/uL   Hemoglobin 14.9  12.0 - 15.0 g/dL   HCT 43.4  36.0 - 46.0 %   MCV 85.6  78.0 - 100.0 fL   MCH 29.4  26.0 - 34.0 pg   MCHC 34.3  30.0 - 36.0 g/dL   RDW 13.0  11.5 - 15.5 %   Platelets 137 (*) 150 - 400 K/uL   Neutrophils Relative % 86 (*) 43 - 77 %   Neutro Abs 7.7  1.7 - 7.7 K/uL   Lymphocytes Relative 10 (*) 12 - 46 %   Lymphs Abs 0.9  0.7 - 4.0 K/uL   Monocytes Relative 5  3 - 12 %   Monocytes Absolute 0.4  0.1 - 1.0 K/uL   Eosinophils Relative 0  0 - 5 %   Eosinophils Absolute 0.0  0.0 - 0.7 K/uL   Basophils Relative 0  0 - 1 %   Basophils Absolute 0.0  0.0 - 0.1 K/uL  COMPREHENSIVE METABOLIC PANEL      Result Value Range   Sodium 137  137 - 147 mEq/L   Potassium 3.6 (*) 3.7 - 5.3 mEq/L   Chloride 99  96 - 112 mEq/L   CO2 24  19 - 32 mEq/L   Glucose, Bld 111 (*) 70 - 99 mg/dL   BUN 16  6 - 23 mg/dL   Creatinine, Ser 0.68  0.50 - 1.10 mg/dL  Calcium 8.9  8.4 -  10.5 mg/dL   Total Protein 7.5  6.0 - 8.3 g/dL   Albumin 3.7  3.5 - 5.2 g/dL   AST 19  0 - 37 U/L   ALT 25  0 - 35 U/L   Alkaline Phosphatase 102  39 - 117 U/L   Total Bilirubin 0.3  0.3 - 1.2 mg/dL   GFR calc non Af Amer >90  >90 mL/min   GFR calc Af Amer >90  >90 mL/min    MDM   1. Nausea vomiting and diarrhea    Vomiting and diarrhea with myalgias strongly suggestive of a viral gastroenteritis. No signs of more serious illness. Tachycardia to suggest dehydration and she'll be given IV fluids as well as IV ondansetron and oral loperamide.  She feels much better after above noted treatment. She is discharged with prescription for ondansetron and loperamide.  Delora Fuel, MD 36/64/40 [id number]

## 2013-09-27 MED ORDER — ONDANSETRON HCL 4 MG PO TABS: 4.0000 mg | ORAL_TABLET | Freq: Four times a day (QID) | ORAL | Status: DC | PRN

## 2013-09-27 MED ORDER — LOPERAMIDE HCL 2 MG PO CAPS: 2.0000 mg | ORAL_CAPSULE | Freq: Four times a day (QID) | ORAL | Status: DC | PRN

## 2013-09-27 NOTE — ED Notes (Signed)
Pt given d/c instructions and verbalized understanding. NAD at this time.  

## 2013-09-27 NOTE — Discharge Instructions (Signed)
Nuseas y Vmitos (Nausea and Vomiting) La nusea es la sensacin de Tree surgeon en el estmago o de la necesidad de vomitar. El vmito es un reflejo por el que los contenidos del estmago salen por la boca. El vmito puede ocasionar prdida de lquidos del organismo (deshidratacin). Los nios y los [employer] pueden deshidratarse rpidamente (en especial si tambin tienen diarrea). Las nuseas y los vmitos son sntoma de un trastorno o enfermedad. Es importante Energy manager causa de los sntomas. CAUSAS  Irritacin directa de la membrana que cubre el Carmine. Esta irritacin puede ser resultado del aumento de la produccin de cido, (reflujo gastroesofgico), infecciones, intoxicacin alimentaria, ciertos medicamentos (como antinflamatorios no esteroideos), consumo de alcohol o de tabaco.  Seales del cerebro.Estas seales pueden ser un dolor de cabeza, exposicin al calor, trastornos del odo interno, aumento de la presin en el cerebro por lesiones, infeccin, un tumor o conmocin cerebral, estmulos emocionales o problemas metablicos.  Una obstruccin en el tracto gastrointestinal (obstruccin intestinal).  Ciertas enfermedades como la diabetes, problemas en la vescula biliar, apendicitis, problemas renales, cncer, sepsis, sntomas atpicos de infarto o trastornos alimentarios.  Tratamientos mdicos como la quimioterapia y la radiacin.  Medicamentos que inducen al sueo (anestesia general) durante Clementeen Hoof. DIAGNSTICO  El mdico podr solicitarle algunos anlisis si los problemas no mejoran luego de algunos das. Tambin podrn pedirle anlisis si los sntomas son graves o si el motivo de los vmitos o las nuseas no est claro. Los [employer]SCO ser:   Anlisis de [place].  Anlisis de [place].  Pruebas de materia fecal.  Cultivos (para buscar evidencias de infeccin).  Radiografas u otros estudios por imgenes. Los [employer] de las pruebas lo ayudarn al mdico a  tomar decisiones acerca del mejor curso de tratamiento o la necesidad de [employer].  TRATAMIENTO  Debe estar bien hidratado. Beba con frecuencia pequeas cantidades de lquido.Puede beber agua, bebidas deportivas, caldos claros o comer pequeos trocitos de hielo o gelatina para mantenerse hidratado.Cuando coma, hgalo lentamente para evitar las nuseas.Hay medicamentos para evitar las nuseas que pueden aliviarlo.  INSTRUCCIONES PARA EL CUIDADO DOMICILIARIO  Si su mdico le prescribe medicamentos tmelos como se le haya indicado.  Si no tiene hambre, no se fuerce a comer. Sin embargo, es necesario que tome lquidos.  Si tiene hambre alimntese con una dieta normal, a menos que el mdico le indique otra cosa.  Los mejores alimentos son Ardelia Mems combinacin de carbohidratos complejos (arroz, trigo, papas, pan), carnes magras, yogur, frutas y Photographer.  Evite los alimentos ricos en grasas porque dificultan la digestin.  Beba gran cantidad de lquido para mantener la orina de tono claro o color amarillo plido.  Si est deshidratado, consulte a su mdico para que le d instrucciones especficas para volver a hidratarlo. Los signos de deshidratacin son:  Doristine Section sed.  Labios y boca secos.  Mareos.  Elmon Else.  Disminucin de la frecuencia y cantidad de la [place].  Confusin.  Tiene el pulso o la respiracin acelerados. SOLICITE ATENCIN MDICA DE INMEDIATO SI:  Vomita sangre o algo similar a la borra del caf.  La materia fecal (heces) es negra o tiene [address].  Sufre una cefalea grave o rigidez en el cuello.  Se siente confundido.  Siente dolor abdominal intenso.  Tiene dolor en el pecho o dificultad para respirar.  No orina por 8 horas.  Tiene la piel fra y pegajosa.  Sigue vomitando durante ms de 24 a 48 horas.  Tiene fiebre. ASEGRESE QUE:   Comprende  estas instrucciones.  Controlar su enfermedad.  Solicitar ayuda inmediatamente si no mejora o  si empeora. Document Released: 09/09/2007 Document Revised: 11/12/2011 [facility] Patient Information 2014 [place], [place].  Diarrea  (Diarrhea) La diarrea consiste en evacuaciones intestinales frecuentes, blandas o acuosas. Puede hacerlo sentir dbil y deshidratado. La deshidratacin puede hacer que se sienta cansado, sediento, tener la boca seca y que haya disminucin de [place], que a menudo es de color amarillo oscuro. La diarrea es un signo de otro problema, generalmente una infeccin que no durar [employer]. En la [employer], la diarrea dura tpicamente 2 a 3 das. Sin embargo, puede durar ms tiempo si se trata de un signo de algo ms serio. Es importante tratar la diarrea como lo indique su mdico para disminuir o prevenir futuros episodios de Clinical biochemist.  CAUSAS  Algunas causas comunes son:   Infecciones gastrointestinales causadas por virus, bacterias o parsitos.  Intoxicacin alimentaria o alergias a los alimentos.  Ciertos medicamentos, como los antibiticos, quimioterapia y laxantes.  Edulcorantes artificiales y fructosa.  Los trastornos [employer]. INSTRUCCIONES PARA EL CUIDADO EN EL HOGAR   Asegure una adecuada ingesta de lquidos (hidratacin). Evite los lquidos que contengan azcares simples o las bebidas deportivas, los jugos de frutas, los productos derivados de la leche entera y [place]. Si bebe la cantidad suficiente de lquidos, la orina debe ser clara o amarillo plido. Una solucin de rehidratacin oral se puede comprar en las farmacias, en las tiendas minoristas y por Internet. Se puede preparar una solucin de rehidratacin oral casera con los siguientes ingredientes:     cucharadita de sal.   cucharadita de bicarbonato.   de cucharadita de sal sustituta que contenga cloruro de potasio.  1  cucharada de azcar.  1l (34 onzas) de agua.  Ciertos alimentos y bebidas pueden aumentar la velocidad a la que el alimento se mueve a travs del tracto  gastrointestinal (GI). Estos alimentos y bebidas deben evitarse e incluyen:  Bebidas alcohlicas y con cafena.  Alimentos ricos en fibra, como frutas y verduras, nueces, semillas, panes y cereales integrales.  Alimentos y bebidas endulzados con alcoholes de azcar, tales como xilitol, sorbitol, y manitol.  Algunos alimentos pueden ser bien tolerados y puede ayudar a [employer], incluyendo:  Alimentos con almidn, como arroz, pan, pasta, cereales bajos en azcar, avena, smola de maz, papas al horno, galletas y panecillos.  Bananas.  Pur de [employer].  Agregue alimentos ricos en probiticos a la dieta del nio para ayudar a aumentar las bacterias saludables en el tracto gastrointestinal, como el yogur y productos lcteos fermentados.  Lvese bien las manos despus de cada episodio de diarrea.  Tome slo medicamentos de venta libre o recetados, segn las indicaciones del [place] un bao caliente para ayudar a disminuir ardor o dolor por los episodios frecuentes de diarrea. SOLICITE ATENCIN MDICA DE INMEDIATO SI:   No puede retener los lquidos.  Tiene vmitos persistentes.  Lollie Marrow en la materia fecal, o las heces son negras y de aspecto alquitranado.  No hay emisin de [place] durante 6 a 8 horas o elimina una pequea cantidad de [place].  Tiene dolor abdominal que aumenta o se localiza.  Est muy mareado o se desvanece.  Sufre un dolor intenso de [place].  La diarrea empeora o no mejora.  Tiene fiebre o sntomas que persisten durante ms de 2 o 3 das.  Tiene fiebre y los sntomas empeoran de manera sbita. ASEGRESE DE QUE:  Comprende estas instrucciones.  Controlar su enfermedad.  Solicitar ayuda de inmediato si no mejora o si empeora. Document Released: 08/20/2005 Document Revised: 08/06/2012 [facility] Patient Information 2014 [place], [place].  Ondansetron tablets Qu es este medicamento? El ONDANSETRN se [place] para tratar  las nuseas y el vmito causados por la quimioterapia. Tambin se puede Risk manager para [place] y el vmito despus de una operacin. Este medicamento puede ser utilizado para otros usos; si tiene alguna pregunta consulte con su proveedor de atencin mdica o con su farmacutico. MARCAS COMERCIALES DISPONIBLES: Zofran Qu le debo informar a mi profesional de la salud antes de tomar este medicamento? Necesita saber si usted presenta alguno de los siguientes problemas o situaciones: -enfermedad cardiaca -antecedentes de pulso cardiaco irregular -enfermedad heptica -niveles bajos de magnesio o potasio en la sangre -una reaccin alrgica o inusual al Mendel Ryder, granisetrn, a otros medicamentos, alimentos, colorantes o conservantes -si est embarazada o buscando quedar embarazada -si est amamantando a un beb Cmo debo [employer]? Tome este medicamento por va oral con un vaso de agua. Siga las instrucciones de la etiqueta del [place]. Tome sus dosis a intervalos regulares. No tome su medicamento con una frecuencia mayor que la indicada. Hable con su pediatra para informarse acerca del uso de este medicamento en nios. Puede requerir atencin especial. Sobredosis: Pngase en contacto inmediatamente con un centro toxicolgico o una sala de urgencia si usted cree que haya tomado demasiado medicamento. ATENCIN: [employer] es solo para usted. No comparta este medicamento con nadie. Qu sucede si me olvido de una dosis? Si olvida una dosis, tmela lo antes posible. Si es casi la hora de la prxima dosis, tome slo esa dosis. No tome dosis adicionales o dobles. Qu puede interactuar con este medicamento? No tome esta medicina con ninguno de los siguientes medicamentos: -apomorfina -ciertos medicamentos para infecciones micticas, tales como fluconazol, quetoconazol, itraconazol, posaconazol,  voriconazol -cisapride -dofetilida -dronedarona -pimozida -tioridazina -ziprasidona  Esta medicina tambin puede interactuar con los siguientes medicamentos: -carbamazepina -ciertos medicamentos para la depresin, ansiedad o trastornos psicticos -fentanilo -linezolid -IMAOs, tales como Carbex, Eldepryl, Marplan, Nardil y Parnate -azul de metileno (inyeccin por va intravenosa) -otros medicamentos que prolongan el intervalo QT (causa un ritmo cardiaco anormal) -fenitona -rifampicina -tramadol Puede ser que esta lista no menciona todas las posibles interacciones. Informe a su profesional de [employer]	[place] de [employer] productos a base de hierbas, medicamentos de [place] o suplementos nutritivos que est tomando. Si usted fuma, consume bebidas alcohlicas o si utiliza drogas ilegales, indqueselo tambin a su profesional de [employer]	[place]. Algunas sustancias pueden interactuar con su medicamento. A qu debo estar atento al usar Coca[employer]Cola? Si experimenta algn signo de reaccin alrgica, consulte a su mdico o a su profesional de [employer]	[place] lo antes posible. Qu efectos secundarios puedo tener al [employer] este medicamento? Efectos secundarios que debe informar a su mdico o a Barrister's clerk de la salud tan pronto como sea posible: -Chief of Staff como erupcin cutnea, picazn o urticarias, hinchazn de la cara, labios o lengua -problemas respiratorios -confusin -mareos -pulso cardiaco rpido o irregular -sensacin de desmayos o aturdimiento, cadas -fiebre y escalofros -prdida del equilibrio o coordinacin -convulsiones -sudoracin -hinchazn de las manos o pies -opresin en el pecho -temblores -cansancio o debilidad inusual Efectos secundarios que, por lo general, no requieren atencin mdica (debe informarlos a su mdico o a su profesional de la salud si persisten o si son molestos): -estreimiento o diarrea -dolor de cabeza Puede ser  que esta lista no menciona todos  los posibles efectos secundarios. Comunquese a su mdico por asesoramiento mdico [employer]. Usted puede informar los efectos secundarios a la FDA por telfono al 1-800-FDA-1088. Dnde debo guardar mi medicina? Mantngala fuera del alcance de los nios. Gurdela a una temperatura de [place] 2 y 33 grados C (16 y 67 grados F). Deseche todo el medicamento que no haya utilizado, despus de la fecha de vencimiento. ATENCIN: Este folleto es un resumen. Puede ser que no cubra toda la posible informacin. Si usted tiene preguntas acerca de esta medicina, consulte con su mdico, su farmacutico o su profesional de Technical sales engineer.  2014, Elsevier/Gold Standard. (2013-06-09 13:57:48)  Loperamide tablets or capsules Qu es este medicamento? La LOPERAMIDA se utiliza para tratar [employer]. Este medicamento puede ser utilizado para otros usos; si tiene alguna pregunta consulte con su proveedor de atencin mdica o con su farmacutico. MARCAS COMERCIALES DISPONIBLES: Anti-Diarrheal, Imodium A-D, K-Pek II Qu le debo informar a mi profesional de la salud antes de tomar este medicamento? Necesita saber si usted presenta alguno de los siguientes problemas o situaciones: -heces de color oscuro o con sangre -intoxicacin alimentaria bacteriana -colitis o moco en sus heces -si est tomando antibiticos para una infeccin actualmente -fiebre -enfermedad heptica -dolor abdominal severo, hinchazn -una reaccin alrgica o inusual a la loperamida, a otros medicamentos, alimentos, colorantes o conservantes -si est embarazada o buscando quedar embarazada -si est amamantando a un beb Cmo debo utilizar este medicamento? Tome este medicamento por va oral con un vaso de agua. Siga las instrucciones de la etiqueta del [place]. Tome sus dosis a intervalos regulares. No tome su medicamento con una frecuencia mayor a la indicada. Hable con su pediatra para informarse acerca del uso de este  medicamento en nios. Puede requerir atencin especial. Sobredosis: Pngase en contacto inmediatamente con un centro toxicolgico o una sala de urgencia si usted cree que haya tomado demasiado medicamento. ATENCIN: [employer] es solo para usted. No comparta este medicamento con nadie. Qu sucede si me olvido de una dosis? No se aplica en este caso. Esta medicina no es para uso regular. Tome esta medicina solamente mientras que contina teniendo evacuacines intestinal flojas. No tome ms medicina que la recomendada por la etiqueta del medicamento o por su profesional de Technical sales engineer. Qu puede interactuar con este medicamento? No tome esta medicina con ninguno de los siguientes medicamentos: -alosetrn Esta medicina tambin puede interactuar con los siguientes medicamentos: -quinidina -ritonavir -saquinavir Puede ser que esta lista no menciona todas las posibles interacciones. Informe a su profesional de [employer]	[place] de [employer] productos a base de hierbas, medicamentos de [place] o suplementos nutritivos que est tomando. Si usted fuma, consume bebidas alcohlicas o si utiliza drogas ilegales, indqueselo tambin a su profesional de [employer]	[place]. Algunas sustancias pueden interactuar con su medicamento. A qu debo estar atento al usar Coca-Cola? No tome este medicamento durante ms de 1 semana sin consultar a su mdico o a su profesional de [employer]	[place]. Si los sntomas no [place], es posible que sea necesario estudiar su problema ms profundamente. Consulte a su mdico o a su profesional de la salud de inmediato si desarrolla fiebre, dolor abdominal severo, hinchazn o si tiene heces o diarrea de color oscuro o con sangre. Puede experimentar mareos o somnolencia. No conduzca ni utilice maquinaria ni haga nada que Associate Professor en estado de alerta hasta que sepa cmo le afecta este medicamento. No se  siente ni se ponga de pie con rapidez, especialmente si es un paciente  de edad avanzada. Esto reduce el riesgo de mareos o [employer]. El alcohol Fert[place] probabilidades de [place] y somnolencia. Evite consumir bebidas alcohlicas. Se le podr secar la boca. Masticar chicle sin azcar, chupar caramelos duros y beber agua en abundancia podrn ayudarle a mantener la boca hmeda. Si el problema no desaparece o es severo, consulte con su mdico. Tomar agua en abundancia tambin puede ayudar a evitar la deshidratacin causada por la diarrea. Los pacientes de edad avanzada pueden experimentar una respuesta ms variable a los efectos de Coca-Cola y son ms susceptibles a los efectos de la deshidratacin. Qu efectos secundarios puedo tener al [employer] este medicamento? Efectos secundarios que debe informar a su mdico o a Barrister's clerk de la salud tan pronto como sea posible: -Chief of Staff como erupcin cutnea, picazn o urticarias, hinchazn de la cara, labios o lengua -sensacin de llenura o de abotagamiento en su abdomen -visin borrosa -prdida del apetito -dolor de estmago Efectos secundarios que, por lo general, no requieren atencin mdica (debe informarlos a su mdico o a su profesional de la salud si persisten o si son molestos): -estreimiento -somnolencia o mareos -boca seca -nuseas, vmito Puede ser que [employer] no menciona todos los posibles efectos secundarios. Comunquese a su mdico por asesoramiento mdico [employer]. Usted puede informar los efectos secundarios a la FDA por telfono al 1-800-FDA-1088. Dnde debo guardar mi medicina? Mantngala fuera del alcance de los nios. Gurdela a temperatura ambiente de entre 15 y 75 grados C (61 y 24 grados F). Mantenga el envase bien cerrado. Deseche todo el medicamento que no haya utilizado, despus de la fecha de vencimiento. ATENCIN: Este folleto es un resumen. Puede ser que no cubra toda la posible informacin. Si usted tiene preguntas acerca de esta medicina,  consulte con su mdico, su farmacutico o su profesional de Technical sales engineer.  2014, Elsevier/Gold Standard. (2007-07-01 15:37:00)

## 2013-11-07 ENCOUNTER — Telehealth: Payer: Self-pay | Admitting: Family Medicine

## 2013-11-07 DIAGNOSIS — E039 Hypothyroidism, unspecified: Secondary | ICD-10-CM

## 2013-11-07 NOTE — Telephone Encounter (Signed)
Pt has had several ER visits for nausea/vomiting. She is overdue for f/u thyroid and last readings were markedly abnormal which could manifest as nausea/vomiting sxs. plz schedule f/u lab visit and afterwards office visit to review.  ordered in chart.

## 2013-11-08 IMAGING — CT CT ABD-PELV W/ CM
2 of 5 series · 17 of 46 positions shown, 19 images · IV contrast (CONTRAST)
Comparison: 04/06/2012

CLINICAL DATA: Left lower abdominal and flank pain common nausea.

EXAM:
CT ABDOMEN AND PELVIS WITH CONTRAST
TECHNIQUE: Multidetector CT imaging of the abdomen and pelvis was performed
using the standard protocol following bolus administration of
intravenous contrast.
CONTRAST:  100mL OMNIPAQUE IOHEXOL 300 MG/ML  SOLN

[Series 2: routine · axial · 0.71mm/px · z∈[-450,-30]mm · 14 of 96 slices shown, 16 images]
[im 6/96  soft-tissue]
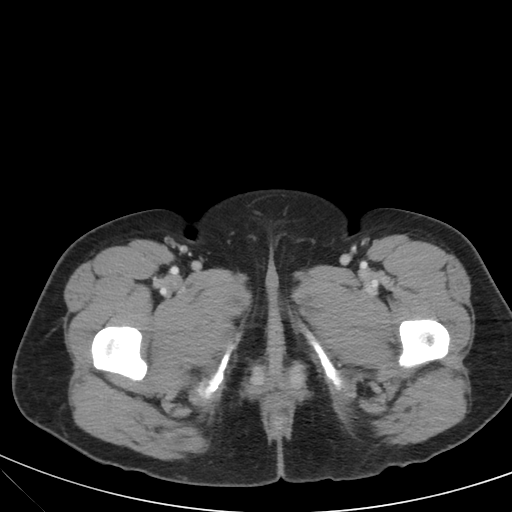
[im 6/96  bone]
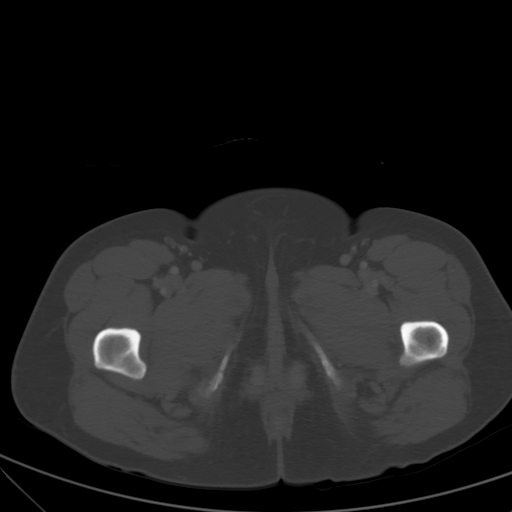
[im 11/96  soft-tissue]
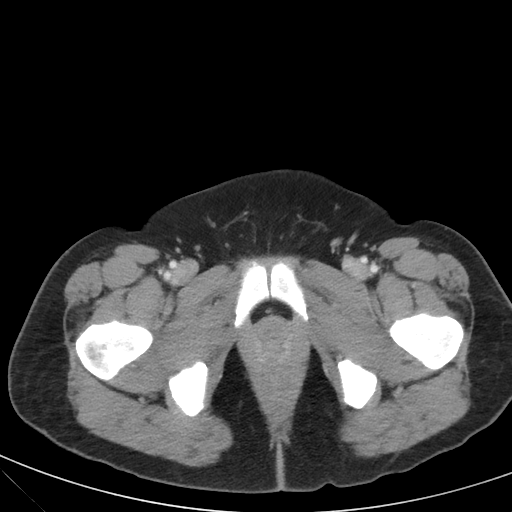
[im 22/96  soft-tissue]
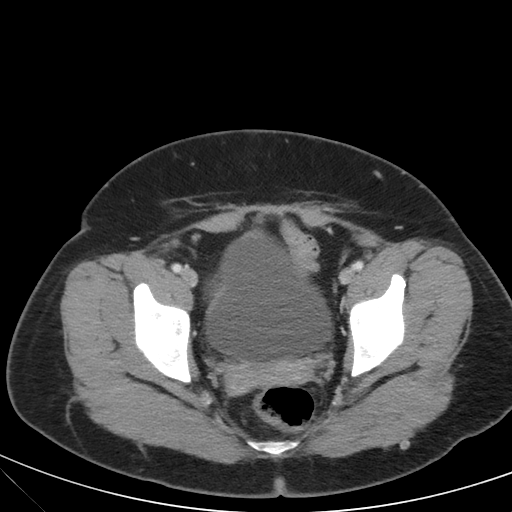
[im 27/96  soft-tissue]
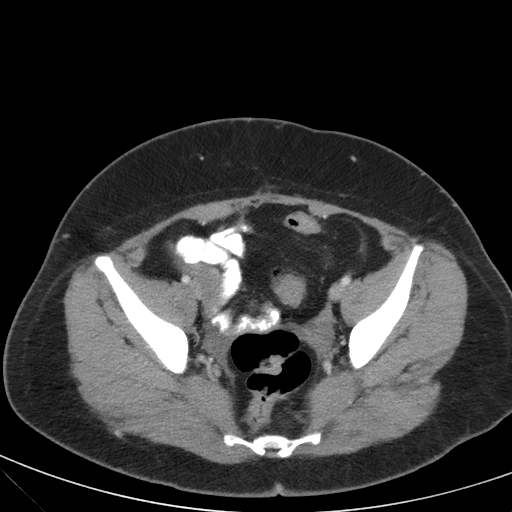
[im 32/96  soft-tissue]
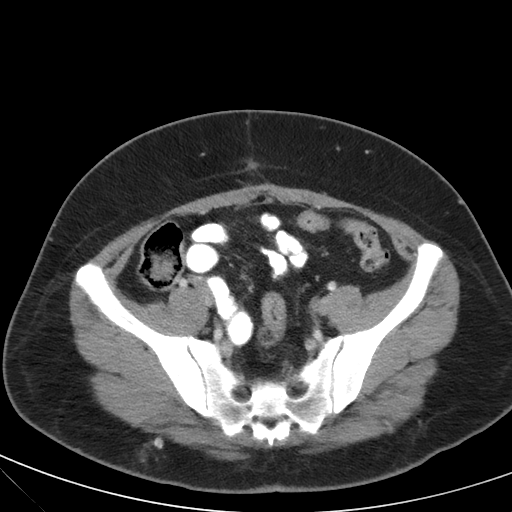
[im 37/96  soft-tissue]
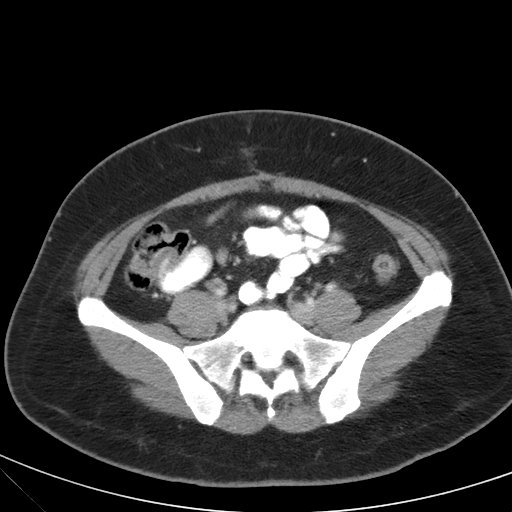
[im 43/96  soft-tissue]
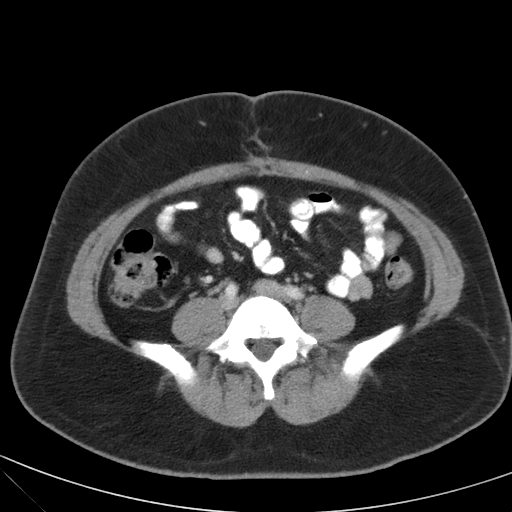
[im 53/96  soft-tissue]
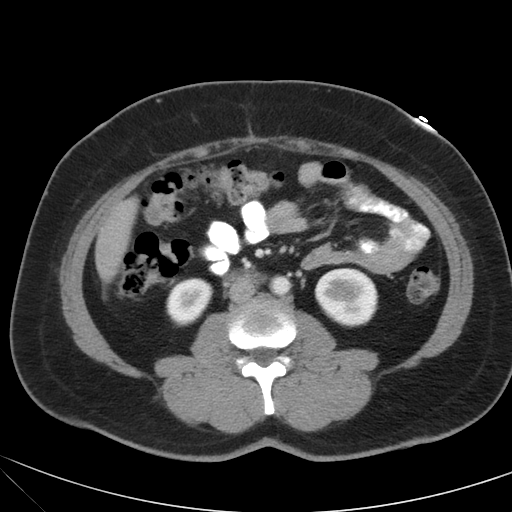
[im 59/96  soft-tissue]
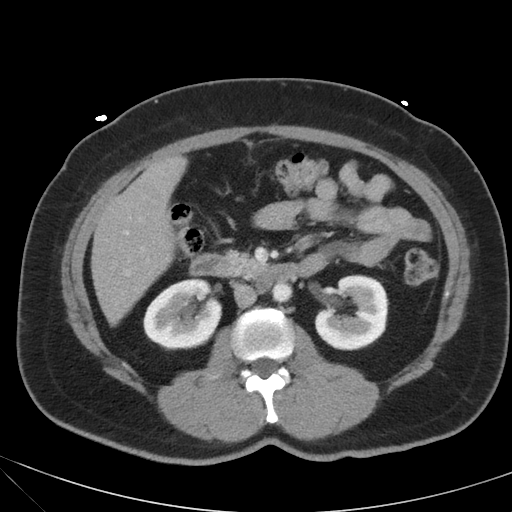
[im 59/96  bone]
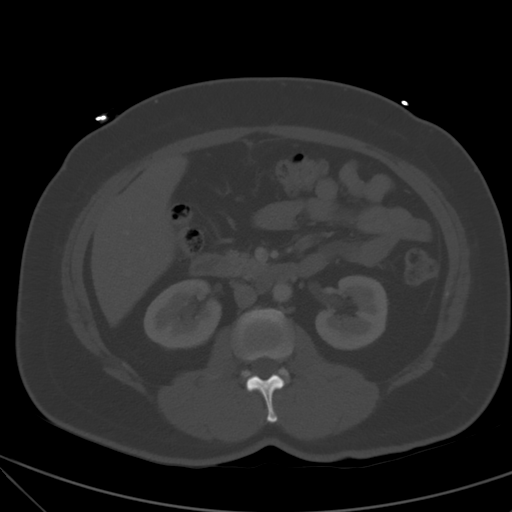
[im 64/96  soft-tissue]
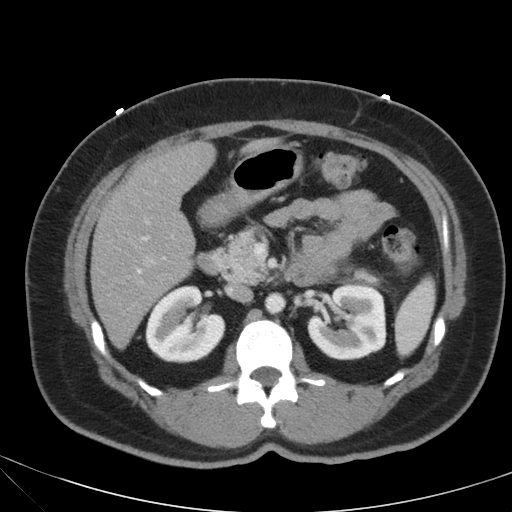
[im 69/96  soft-tissue]
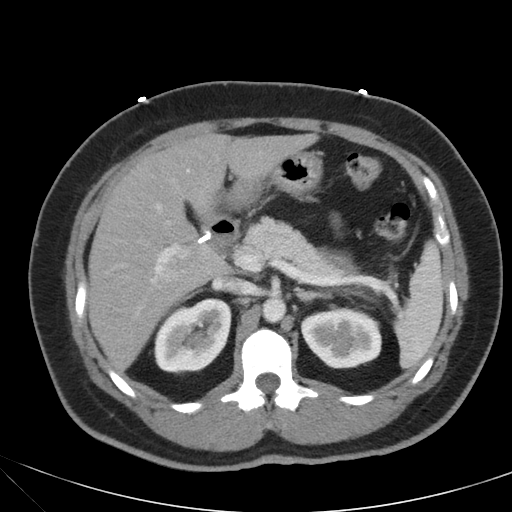
[im 74/96  soft-tissue]
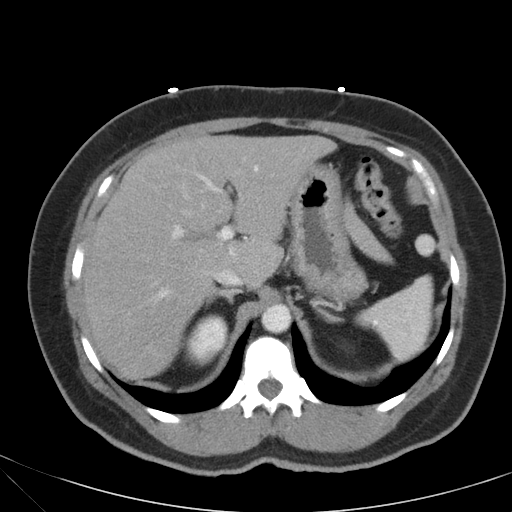
[im 85/96  soft-tissue]
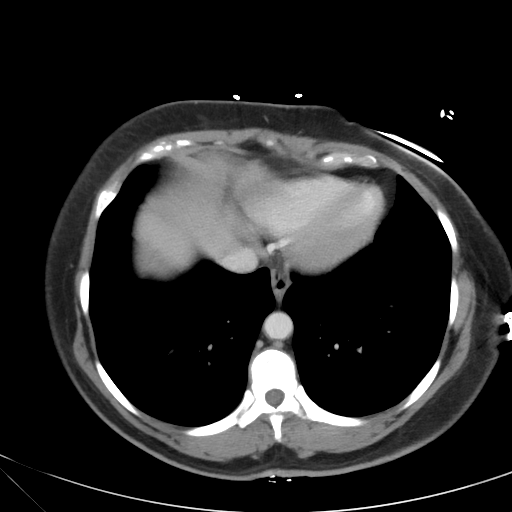
[im 90/96  soft-tissue]
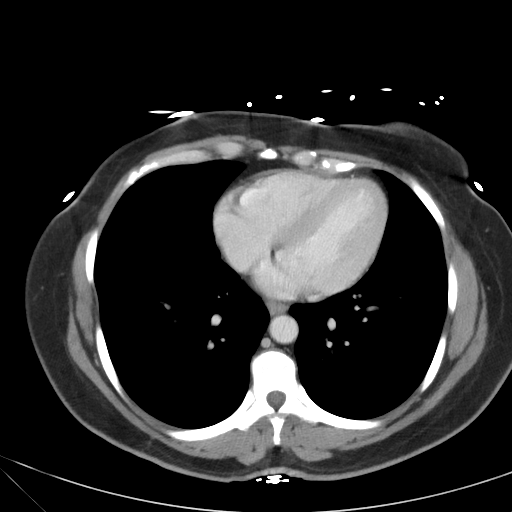

[mpr, coronals, coronal · coronal · 0.93mm/px · 3 of 108 slices shown]
[im 36/108  soft-tissue]
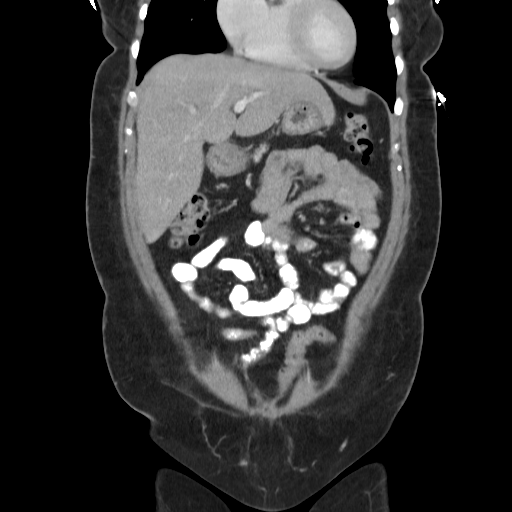
[im 48/108  soft-tissue]
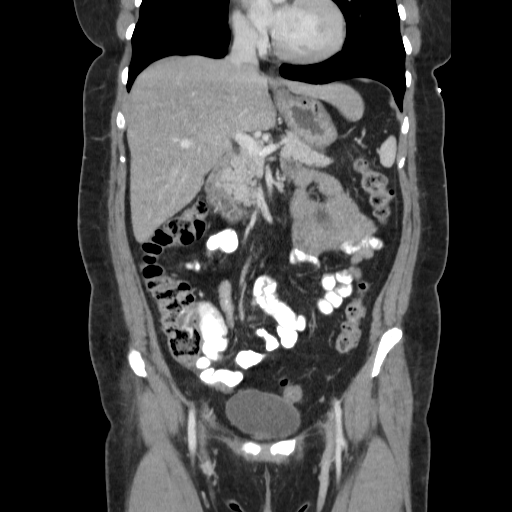
[im 60/108  soft-tissue]
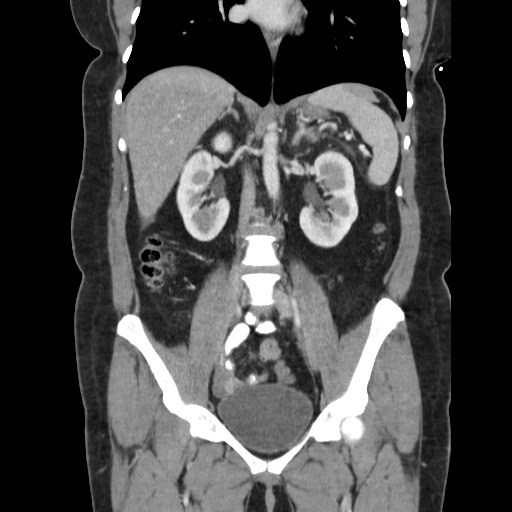

[17 of 46 positions shown; findings below may reference images not displayed]

FINDINGS: Visualized lung bases clear. Vascular clips in the gallbladder
fossa. Unremarkable liver, spleen and accessory splenule, adrenal
glands, kidneys, pancreas, abdominal aorta. Stomach, small bowel,
and colon are nondilated. Appendix is surgically absent. No
significant diverticular disease. Urinary bladder physiologically
distended. Previous hysterectomy. Ovaries unremarkable. There is a
small amount of free pelvic fluid, probably physiologic. No free
air. Portal vein patent. No adenopathy. No hydronephrosis. Regional
bones unremarkable.
IMPRESSION: No acute abnormality. Stable postop changes.

## 2013-11-09 NOTE — Telephone Encounter (Signed)
Dr. Darnell Level spoke with patient due to language barrier. Appt scheduled.

## 2013-11-12 ENCOUNTER — Encounter: Payer: Self-pay | Admitting: Family Medicine

## 2013-11-12 ENCOUNTER — Ambulatory Visit (INDEPENDENT_AMBULATORY_CARE_PROVIDER_SITE_OTHER): Payer: 59 | Admitting: Family Medicine

## 2013-11-12 VITALS — BP 170/120 | HR 88 | Temp 98.1°F | Wt 159.4 lb

## 2013-11-12 DIAGNOSIS — M899 Disorder of bone, unspecified: Secondary | ICD-10-CM

## 2013-11-12 DIAGNOSIS — E785 Hyperlipidemia, unspecified: Secondary | ICD-10-CM

## 2013-11-12 DIAGNOSIS — I10 Essential (primary) hypertension: Secondary | ICD-10-CM

## 2013-11-12 DIAGNOSIS — E039 Hypothyroidism, unspecified: Secondary | ICD-10-CM

## 2013-11-12 DIAGNOSIS — M949 Disorder of cartilage, unspecified: Secondary | ICD-10-CM

## 2013-11-12 DIAGNOSIS — J45909 Unspecified asthma, uncomplicated: Secondary | ICD-10-CM

## 2013-11-12 DIAGNOSIS — M898X1 Other specified disorders of bone, shoulder: Secondary | ICD-10-CM

## 2013-11-12 LAB — LIPID PANEL
CHOLESTEROL: 231 mg/dL — AB (ref 0–200)
HDL: 40.5 mg/dL (ref >39.00)
LDL Cholesterol: 155 mg/dL — ABNORMAL HIGH (ref 0–99)
TRIGLYCERIDES: 179 mg/dL — AB (ref 0.0–149.0)
Total CHOL/HDL Ratio: 6
VLDL: 35.8 mg/dL (ref 0.0–40.0)

## 2013-11-12 LAB — COMPREHENSIVE METABOLIC PANEL
ALBUMIN: 4.3 g/dL (ref 3.5–5.2)
ALT: 31 U/L (ref 0–35)
AST: 27 U/L (ref 0–37)
Alkaline Phosphatase: 80 U/L (ref 39–117)
BILIRUBIN TOTAL: 0.4 mg/dL (ref 0.3–1.2)
BUN: 17 mg/dL (ref 6–23)
CO2: 28 mEq/L (ref 19–32)
CREATININE: 0.7 mg/dL (ref 0.4–1.2)
Calcium: 9.5 mg/dL (ref 8.4–10.5)
Chloride: 102 mEq/L (ref 96–112)
GFR: 95.48 mL/min (ref >60.00)
GLUCOSE: 97 mg/dL (ref 70–99)
POTASSIUM: 3.7 meq/L (ref 3.5–5.1)
Sodium: 138 mEq/L (ref 135–145)
Total Protein: 7.8 g/dL (ref 6.0–8.3)

## 2013-11-12 LAB — TSH: TSH: 1.74 u[IU]/mL (ref 0.35–5.50)

## 2013-11-12 MED ORDER — MONTELUKAST SODIUM 10 MG PO TABS: 10.0000 mg | ORAL_TABLET | Freq: Every day | ORAL | Status: DC

## 2013-11-12 MED ORDER — AMLODIPINE BESYLATE 10 MG PO TABS: 10.0000 mg | ORAL_TABLET | Freq: Every day | ORAL | Status: DC

## 2013-11-12 MED ORDER — LORATADINE 10 MG PO TABS: 10.0000 mg | ORAL_TABLET | Freq: Every day | ORAL | Status: DC

## 2013-11-12 MED ORDER — IBUPROFEN 600 MG PO TABS: 600.0000 mg | ORAL_TABLET | Freq: Four times a day (QID) | ORAL | Status: DC | PRN

## 2013-11-12 NOTE — Assessment & Plan Note (Signed)
Pt states prior well controlled on singulair nightly - requests refill. Worsening recently.  No recent inhaler use.

## 2013-11-12 NOTE — Assessment & Plan Note (Signed)
Recheck levels today as due - h/o uncontrolled acquired hypothyroidism after hyperthyroid treated.

## 2013-11-12 NOTE — Patient Instructions (Addendum)
Pare diltiazem. Comienze amlodipine 10mg  diarios. Sangre hoy para revisar tyroide. regrese en 2-3 mese para segiumiento. Blood work today.  Stop diltiazem, start amlodipine 10mg  daily - sent to pharmacy.

## 2013-11-12 NOTE — Progress Notes (Signed)
Pre visit review using our clinic review tool, if applicable. No additional management support is needed unless otherwise documented below in the visit note. 

## 2013-11-12 NOTE — Progress Notes (Signed)
BP 170/120  Pulse 88  Temp(Src) 98.1 F (36.7 C) (Oral)  Wt 159 lb 6.4 oz (72.303 kg)  LMP 11/12/2011   CC: f/u thyroid  Subjective:    Patient ID: [name], female    DOB: 1971-03-27, 43 y.o.   MRN: [id number]  HPI: [name] is a 43 y.o. female presenting on 11/12/2013 with Follow-up and Scapula pain   [name] was recently seen at ER x2 over last 6 months for nausea/vomiting, both times dx with viral gastroenteritis.  Hypothyroid s/p RAI for multinodular goiter - reports compliance with levothyroxine 230mcg daily.  Denies skin/hair changes, denies heat or cold intolerance.  No appetite or weight changes.  No diarrhea.  + constipation.  HTN - Compliant with current antihypertensive regimen of diltiazem 190mg  bid, lisinopril 5mg  daily, and hctz 25mg  daily.  Does check blood pressures at home: 008-676 systolic.  No low blood pressure readings.  Occasional dizziness - notes worse with diltiazem, associated with some nausea.  Denies HA, vision changes, CP/tightness, SOB, leg swelling.   Several month h/o left scapular pain improved with ibuprofen.  Describes burning pain.  Worsening recently. Ran out of ibuprofen which helped.  Massage does help as well.  Right handed.  Relevant past medical, surgical, family and social history reviewed and updated as indicated.  Allergies and medications reviewed and updated. Current Outpatient Prescriptions on File Prior to Visit  Medication Sig  . hydrochlorothiazide (HYDRODIURIL) 25 MG tablet Take one tablet by mouth daily.  Marland Kitchen levothyroxine (SYNTHROID, LEVOTHROID) 200 MCG tablet Take 1 tablet (200 mcg total) by mouth daily. Total dose = 245mcg  . levothyroxine (SYNTHROID, LEVOTHROID) 25 MCG tablet Take 1 tablet (25 mcg total) by mouth daily. Total dose = 272mcg  . lisinopril (PRINIVIL,ZESTRIL) 5 MG tablet Take 1 tablet (5 mg total) by mouth daily.  Marland Kitchen lovastatin (MEVACOR) 20 MG tablet Take 1 tablet by mouth daily.   No current  facility-administered medications on file prior to visit.    Review of Systems Per HPI unless specifically indicated above    Objective:    BP 170/120  Pulse 88  Temp(Src) 98.1 F (36.7 C) (Oral)  Wt 159 lb 6.4 oz (72.303 kg)  LMP 11/12/2011  Physical Exam  Nursing note and vitals reviewed. Constitutional: She appears well-developed and well-nourished. No distress.  HENT:  Mouth/Throat: Oropharynx is clear and moist. No oropharyngeal exudate.  Eyes: Conjunctivae and EOM are normal. Pupils are equal, round, and reactive to light.  Cardiovascular: Normal rate, regular rhythm, normal heart sounds and intact distal pulses.   No murmur heard. Pulmonary/Chest: Effort normal and breath sounds normal. No respiratory distress. She has no wheezes. She has no rales.  Musculoskeletal: She exhibits no edema.  No midline spine tenderness or parasinous mm tenderness Tender to palpation superior to L scapula. FROM at shoulders bilaterally  Skin: Skin is warm and dry. No rash noted.  Scattered bug bites.       Assessment & Plan:   Problem List Items Addressed This Visit   ASTHMA, PERSISTENT     Pt states prior well controlled on singulair nightly - requests refill. Worsening recently.  No recent inhaler use.    Relevant Medications      SINGULAIR 10 MG PO TABS   HYPERLIPIDEMIA     Fenofibrate 134mg  was too expensive.  Now on lovastatin - doubt will reach goals on this med - will check FLP today and titrate accordingly.    Relevant Medications  amLODIpine (NORVASC) tablet   Other Relevant Orders      Comprehensive metabolic panel      Lipid panel   HYPERTENSION, BENIGN SYSTEMIC     Chronic. Uncontrolled today Pt endorses dizziness with diltiazem.  Will stop this med, restart amlodipine 10mg  daily. Continue hctz 25 and lisinopril 5mg  daily. Pt agrees with plan. rtc 3 mo for f/u.    Relevant Orders      Comprehensive metabolic panel   HYPOTHYROIDISM, UNSPECIFIED - Primary      Recheck levels today as due - h/o uncontrolled acquired hypothyroidism after hyperthyroid treated.    Relevant Orders      TSH   Pain of left scapula     Describes neuropathy of skin overlying left scapula.  Exam WNL Responsive to NSAID in past, refilled today. Also provided with stretching exercises for upper back pain from [facility] pt advisor. Consider gabapentin if no improvement.        Follow up plan: Return in about 3 months (around 02/12/2014) for control.

## 2013-11-12 NOTE — Assessment & Plan Note (Signed)
Fenofibrate 134mg  was too expensive.  Now on lovastatin - doubt will reach goals on this med - will check FLP today and titrate accordingly.

## 2013-11-12 NOTE — Assessment & Plan Note (Signed)
Describes neuropathy of skin overlying left scapula.  Exam WNL Responsive to NSAID in past, refilled today. Also provided with stretching exercises for upper back pain from [facility] pt advisor. Consider gabapentin if no improvement.

## 2013-11-12 NOTE — Assessment & Plan Note (Signed)
Chronic. Uncontrolled today Pt endorses dizziness with diltiazem.  Will stop this med, restart amlodipine 10mg  daily. Continue hctz 25 and lisinopril 5mg  daily. Pt agrees with plan. rtc 3 mo for f/u.

## 2013-11-13 ENCOUNTER — Telehealth: Payer: Self-pay | Admitting: Family Medicine

## 2013-11-13 NOTE — Telephone Encounter (Signed)
Relevant patient education mailed to patient.  

## 2013-11-14 ENCOUNTER — Other Ambulatory Visit: Payer: Self-pay | Admitting: Family Medicine

## 2013-11-14 MED ORDER — LEVOTHYROXINE SODIUM 112 MCG PO TABS: 224.0000 ug | ORAL_TABLET | Freq: Every day | ORAL | Status: DC

## 2013-11-16 ENCOUNTER — Encounter: Payer: Self-pay | Admitting: *Deleted

## 2014-04-06 ENCOUNTER — Encounter: Payer: Self-pay | Admitting: Internal Medicine

## 2014-04-06 ENCOUNTER — Ambulatory Visit (INDEPENDENT_AMBULATORY_CARE_PROVIDER_SITE_OTHER): Payer: 59 | Admitting: Internal Medicine

## 2014-04-06 ENCOUNTER — Ambulatory Visit (INDEPENDENT_AMBULATORY_CARE_PROVIDER_SITE_OTHER)
Admission: RE | Admit: 2014-04-06 | Discharge: 2014-04-06 | Disposition: A | Discharge status: Home / Self Care | Payer: 59 | Source: Ambulatory Visit | Attending: Internal Medicine | Admitting: Internal Medicine

## 2014-04-06 VITALS — BP 140/80 | HR 98 | Temp 97.7°F | Wt 151.0 lb

## 2014-04-06 DIAGNOSIS — R1032 Left lower quadrant pain: Secondary | ICD-10-CM

## 2014-04-06 DIAGNOSIS — R109 Unspecified abdominal pain: Secondary | ICD-10-CM

## 2014-04-06 DIAGNOSIS — R110 Nausea: Secondary | ICD-10-CM

## 2014-04-06 LAB — POCT URINALYSIS DIPSTICK
Bilirubin, UA: NEGATIVE
GLUCOSE UA: NEGATIVE
KETONES UA: NEGATIVE
Leukocytes, UA: NEGATIVE
Nitrite, UA: NEGATIVE
Protein, UA: NEGATIVE
RBC UA: NEGATIVE
SPEC GRAV UA: 1.025
Urobilinogen, UA: NEGATIVE
pH, UA: 6

## 2014-04-06 MED ORDER — HYDROCODONE-ACETAMINOPHEN 5-325 MG PO TABS: 1.0000 | ORAL_TABLET | Freq: Four times a day (QID) | ORAL | Status: DC | PRN

## 2014-04-06 NOTE — Patient Instructions (Addendum)

## 2014-04-06 NOTE — Progress Notes (Signed)
Subjective:    Patient ID: [name], female    DOB: [birth year], 43 y.o.   MRN: [id number]  HPI  Pt presents to the clinic today with c/o back pain. She reports this started 4 days ago. She denies any specific injury to the area. The pain starts in her left flank and radiates to her left groin. She did have some frequency a few days ago but denies all other urinary symptoms including blood in her urine. Her BM's are normal, last one a 1 pm today. She denies blood in her stool, nausea or vomiting. There is no possibility of pregnancy. She has no history of kidney stones or diverticulosis. She denies fever, chills or body aches. She has no vaginal discharge or odor.    Review of Systems      Past Medical History  Diagnosis Date  . Hyperthyroidism 10/2000    RAI treatment,  multinodular goiter  . Ectopic pregnancy 04/2006    treated with medication  . Hepatic steatosis 07/2006    on Abd CT done for pain  . 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 Preterm delivery 12/2010    x3, placental abruption, maternal htn, DM  . Hypertension   . Anemia   . ABORTION, SPONTANEOUS 08/18/2007  . IMPAIRED GLUCOSE TOLERANCE 08/18/2007  . Acquired hypothyroidism   . Asthma     Current Outpatient Prescriptions  Medication Sig Dispense Refill  . amLODipine (NORVASC) 10 MG tablet Take 1 tablet (10 mg total) by mouth daily.  30 tablet  11  . hydrochlorothiazide (HYDRODIURIL) 25 MG tablet Take one tablet by mouth daily.  90 tablet  3  . ibuprofen (ADVIL,MOTRIN) 600 MG tablet Take 1 tablet (600 mg total) by mouth every 6 (six) hours as needed.  30 tablet  0  . levothyroxine (SYNTHROID, LEVOTHROID) 112 MCG tablet Take 2 tablets (224 mcg total) by mouth daily before breakfast.  180 tablet  3  . lisinopril (PRINIVIL,ZESTRIL) 5 MG tablet Take 1 tablet (5 mg total) by mouth daily.  90 tablet  3  . loratadine (CLARITIN) 10 MG tablet Take 1 tablet (10 mg total) by mouth daily.       Marland Kitchen lovastatin (MEVACOR) 20 MG tablet Take 1 tablet by mouth daily.      . montelukast (SINGULAIR) 10 MG tablet Take 1 tablet (10 mg total) by mouth at bedtime.  30 tablet  11   No current facility-administered medications for this visit.    No Known Allergies  Family History  Problem Relation Age of Onset  . Hypertension Father   . Stroke Father     hemorrhagic, deceased  . Diabetes Mother   . Coronary artery disease Neg Hx   . Cancer Neg Hx     History   Social History  . Marital Status: Married    Spouse Name: N/A    Number of Children: 1  . Years of Education: N/A   Occupational History  . Not on file.   Social History Main Topics  . Smoking status: Never Smoker   . Smokeless tobacco: Never Used  . Alcohol Use: No  . Drug Use: No  . Sexual Activity: Yes    Partners: Male    Birth Control/ Protection: Surgical     Comment: TUBAL LIGATION   Other Topics Concern  . Not on file   Social History Narrative   Caffeine: none   Lives with husband and 1 daughter (  2012)   Occupation: stay at home mom, prior was daycare provider   Edu: 9th grade   Activity: walks daily     Constitutional: Denies fever, malaise, fatigue, headache or abrupt weight changes. Marland Kitchen Respiratory: Denies difficulty breathing, shortness of breath, cough or sputum production.   Cardiovascular: Denies chest pain, chest tightness, palpitations or swelling in the hands or feet.  Gastrointestinal: Pt reports LLQ abdominal pain. Denies  bloating, constipation, diarrhea or blood in the stool.  GU: Pt reports frequency. Denies urgency, pain with urination, burning sensation, blood in urine, odor or discharge.   No other specific complaints in a complete review of systems (except as listed in HPI above).   Objective:   Physical Exam   BP 140/80  Pulse 98  Temp(Src) 97.7 F (36.5 C) (Tympanic)  Wt 151 lb (68.493 kg)  SpO2 98%  LMP 11/12/2011 Wt Readings from Last 3 Encounters:  04/06/14 151  lb (68.493 kg)  11/12/13 159 lb 6.4 oz (72.303 kg)  09/26/13 159 lb 2 oz (72.179 kg)    General: Appears herstated age, appears in pain but in NAD. Cardiovascular: Normal rate and rhythm. S1,S2 noted.  No murmur, rubs or gallops noted. No JVD or BLE edema. No carotid bruits noted. Pulmonary/Chest: Normal effort and positive vesicular breath sounds. No respiratory distress. No wheezes, rales or ronchi noted.  Abdomen: Soft and tender in the LLQ and over the bladder. Normal bowel sounds, no bruits noted. No distention or masses noted. Liver, spleen and kidneys non palpable. + CVA tenderness on the left. Musculoskeletal: Decreased flexion and extension of the back. Pain with palpation of the lumber spine. Strength 5/5 BLE.   BMET    Component Value Date/Time   NA 138 11/12/2013 1254   K 3.7 11/12/2013 1254   CL 102 11/12/2013 1254   CO2 28 11/12/2013 1254   GLUCOSE 97 11/12/2013 1254   BUN 17 11/12/2013 1254   CREATININE 0.7 11/12/2013 1254   CREATININE 0.86 05/07/2012 1450   CREATININE 0.6 12/22/2010 0922   CALCIUM 9.5 11/12/2013 1254   GFRNONAA >90 09/26/2013 2136   GFRAA >90 09/26/2013 2136    Lipid Panel     Component Value Date/Time   CHOL 231* 11/12/2013 1254   TRIG 179.0* 11/12/2013 1254   HDL 40.50 11/12/2013 1254   CHOLHDL 6 11/12/2013 1254   VLDL 35.8 11/12/2013 1254   LDLCALC 155* 11/12/2013 1254    CBC    Component Value Date/Time   WBC 9.1 09/26/2013 2136   RBC 5.07 09/26/2013 2136   HGB 14.9 09/26/2013 2136   HCT 43.4 09/26/2013 2136   PLT 137* 09/26/2013 2136   MCV 85.6 09/26/2013 2136   MCH 29.4 09/26/2013 2136   MCHC 34.3 09/26/2013 2136   RDW 13.0 09/26/2013 2136   LYMPHSABS 0.9 09/26/2013 2136   MONOABS 0.4 09/26/2013 2136   EOSABS 0.0 09/26/2013 2136   BASOSABS 0.0 09/26/2013 2136    Hgb A1C Lab Results  Component Value Date   HGBA1C 5.9 04/03/2011        Assessment & Plan:   Left flank pain, LLQ abdominal pain:  Urinalysis: normal Will check KUB to try to r/o  stone Will check CBC and CMET today Will give low dose norco for short term use (she appears to be in pain) If labs/xray normal but still in pain, will consider CT scan of abdomen  Will followup with you after labs are back, if worse, go to ER

## 2014-04-07 LAB — CBC
HCT: 41.5 % (ref 36.0–46.0)
Hemoglobin: 14 g/dL (ref 12.0–15.0)
MCHC: 33.7 g/dL (ref 30.0–36.0)
MCV: 88.9 fl (ref 78.0–100.0)
PLATELETS: 112 10*3/uL — AB (ref 150.0–400.0)
RBC: 4.68 Mil/uL (ref 3.87–5.11)
RDW: 13.5 % (ref 11.5–15.5)
WBC: 9.9 10*3/uL (ref 4.0–10.5)

## 2014-04-07 LAB — COMPREHENSIVE METABOLIC PANEL
ALK PHOS: 81 U/L (ref 39–117)
ALT: 50 U/L — AB (ref 0–35)
AST: 32 U/L (ref 0–37)
Albumin: 3.9 g/dL (ref 3.5–5.2)
BUN: 18 mg/dL (ref 6–23)
CO2: 28 mEq/L (ref 19–32)
CREATININE: 0.8 mg/dL (ref 0.4–1.2)
Calcium: 9.2 mg/dL (ref 8.4–10.5)
Chloride: 97 mEq/L (ref 96–112)
GFR: 79.58 mL/min (ref >60.00)
Glucose, Bld: 134 mg/dL — ABNORMAL HIGH (ref 70–99)
POTASSIUM: 3.5 meq/L (ref 3.5–5.1)
Sodium: 135 mEq/L (ref 135–145)
Total Bilirubin: 0.3 mg/dL (ref 0.2–1.2)
Total Protein: 7.2 g/dL (ref 6.0–8.3)

## 2014-04-08 ENCOUNTER — Emergency Department (HOSPITAL_COMMUNITY)
Admission: EM | Admit: 2014-04-08 | Discharge: 2014-04-09 | Disposition: A | Discharge status: Home / Self Care | Payer: 59 | Attending: Emergency Medicine | Admitting: Emergency Medicine

## 2014-04-08 ENCOUNTER — Encounter (HOSPITAL_COMMUNITY): Payer: Self-pay | Admitting: Emergency Medicine

## 2014-04-08 DIAGNOSIS — Z9851 Tubal ligation status: Secondary | ICD-10-CM | POA: Insufficient documentation

## 2014-04-08 DIAGNOSIS — E039 Hypothyroidism, unspecified: Secondary | ICD-10-CM | POA: Insufficient documentation

## 2014-04-08 DIAGNOSIS — Z9889 Other specified postprocedural states: Secondary | ICD-10-CM | POA: Diagnosis not present

## 2014-04-08 DIAGNOSIS — Z3202 Encounter for pregnancy test, result negative: Secondary | ICD-10-CM | POA: Insufficient documentation

## 2014-04-08 DIAGNOSIS — I10 Essential (primary) hypertension: Secondary | ICD-10-CM | POA: Diagnosis not present

## 2014-04-08 DIAGNOSIS — Z9089 Acquired absence of other organs: Secondary | ICD-10-CM | POA: Insufficient documentation

## 2014-04-08 DIAGNOSIS — Z90710 Acquired absence of both cervix and uterus: Secondary | ICD-10-CM | POA: Insufficient documentation

## 2014-04-08 DIAGNOSIS — Z8673 Personal history of transient ischemic attack (TIA), and cerebral infarction without residual deficits: Secondary | ICD-10-CM | POA: Diagnosis not present

## 2014-04-08 DIAGNOSIS — Z79899 Other long term (current) drug therapy: Secondary | ICD-10-CM | POA: Diagnosis not present

## 2014-04-08 DIAGNOSIS — Z8719 Personal history of other diseases of the digestive system: Secondary | ICD-10-CM | POA: Insufficient documentation

## 2014-04-08 DIAGNOSIS — J45909 Unspecified asthma, uncomplicated: Secondary | ICD-10-CM | POA: Diagnosis not present

## 2014-04-08 DIAGNOSIS — R197 Diarrhea, unspecified: Secondary | ICD-10-CM | POA: Insufficient documentation

## 2014-04-08 DIAGNOSIS — R1012 Left upper quadrant pain: Secondary | ICD-10-CM | POA: Diagnosis not present

## 2014-04-08 DIAGNOSIS — R109 Unspecified abdominal pain: Secondary | ICD-10-CM

## 2014-04-08 DIAGNOSIS — Z862 Personal history of diseases of the blood and blood-forming organs and certain disorders involving the immune mechanism: Secondary | ICD-10-CM | POA: Diagnosis not present

## 2014-04-08 HISTORY — DX: Unspecified ovarian cyst, unspecified side: N83.209

## 2014-04-08 LAB — CBC WITH DIFFERENTIAL/PLATELET
BASOS PCT: 1 % (ref 0–1)
Basophils Absolute: 0.1 10*3/uL (ref 0.0–0.1)
Eosinophils Absolute: 0.1 10*3/uL (ref 0.0–0.7)
Eosinophils Relative: 1 % (ref 0–5)
HCT: 42.6 % (ref 36.0–46.0)
HEMOGLOBIN: 14.4 g/dL (ref 12.0–15.0)
LYMPHS ABS: 2 10*3/uL (ref 0.7–4.0)
Lymphocytes Relative: 20 % (ref 12–46)
MCH: 30 pg (ref 26.0–34.0)
MCHC: 33.8 g/dL (ref 30.0–36.0)
MCV: 88.8 fL (ref 78.0–100.0)
MONOS PCT: 7 % (ref 3–12)
Monocytes Absolute: 0.7 10*3/uL (ref 0.1–1.0)
NEUTROS ABS: 7.2 10*3/uL (ref 1.7–7.7)
Neutrophils Relative %: 71 % (ref 43–77)
Platelets: 148 10*3/uL — ABNORMAL LOW (ref 150–400)
RBC: 4.8 MIL/uL (ref 3.87–5.11)
RDW: 12.9 % (ref 11.5–15.5)
WBC: 10.1 10*3/uL (ref 4.0–10.5)

## 2014-04-08 LAB — COMPREHENSIVE METABOLIC PANEL
ALK PHOS: 86 U/L (ref 39–117)
ALT: 63 U/L — ABNORMAL HIGH (ref 0–35)
ANION GAP: 15 (ref 5–15)
AST: 31 U/L (ref 0–37)
Albumin: 3.9 g/dL (ref 3.5–5.2)
BILIRUBIN TOTAL: 0.2 mg/dL — AB (ref 0.3–1.2)
BUN: 22 mg/dL (ref 6–23)
CHLORIDE: 99 meq/L (ref 96–112)
CO2: 25 meq/L (ref 19–32)
Calcium: 9.4 mg/dL (ref 8.4–10.5)
Creatinine, Ser: 0.68 mg/dL (ref 0.50–1.10)
GFR calc Af Amer: >90 mL/min (ref >90)
GFR calc non Af Amer: >90 mL/min (ref >90)
GLUCOSE: 131 mg/dL — AB (ref 70–99)
POTASSIUM: 3.2 meq/L — AB (ref 3.7–5.3)
Sodium: 139 mEq/L (ref 137–147)
Total Protein: 7.8 g/dL (ref 6.0–8.3)

## 2014-04-08 LAB — LIPASE, BLOOD: Lipase: 53 U/L (ref 11–59)

## 2014-04-08 LAB — URINALYSIS, ROUTINE W REFLEX MICROSCOPIC
GLUCOSE, UA: NEGATIVE mg/dL
HGB URINE DIPSTICK: NEGATIVE
Ketones, ur: NEGATIVE mg/dL
Leukocytes, UA: NEGATIVE
Nitrite: NEGATIVE
PH: 5.5 (ref 5.0–8.0)
Protein, ur: NEGATIVE mg/dL
Specific Gravity, Urine: 1.027 (ref 1.005–1.030)
Urobilinogen, UA: 0.2 mg/dL (ref 0.0–1.0)

## 2014-04-08 LAB — POC URINE PREG, ED: PREG TEST UR: NEGATIVE

## 2014-04-08 MED ORDER — MORPHINE SULFATE 4 MG/ML IJ SOLN
4.0000 mg | Freq: Once | INTRAMUSCULAR | Status: AC
  Administered 2014-04-08: 4 mg via INTRAVENOUS
  Filled 2014-04-08: qty 1

## 2014-04-08 MED ORDER — IOHEXOL 300 MG/ML  SOLN
25.0000 mL | Freq: Once | INTRAMUSCULAR | Status: AC | PRN
  Administered 2014-04-08: 25 mL via ORAL

## 2014-04-08 MED ORDER — POTASSIUM CHLORIDE CRYS ER 20 MEQ PO TBCR
40.0000 meq | EXTENDED_RELEASE_TABLET | Freq: Once | ORAL | Status: AC
  Administered 2014-04-08: 40 meq via ORAL
  Filled 2014-04-08: qty 2

## 2014-04-08 MED ORDER — SODIUM CHLORIDE 0.9 % IV BOLUS (SEPSIS)
1000.0000 mL | Freq: Once | INTRAVENOUS | Status: AC
  Administered 2014-04-08: 1000 mL via INTRAVENOUS

## 2014-04-08 NOTE — ED Notes (Signed)
Pt c/o left flank pain that radiates into left lower quadrant abd that started 3 days ago, went to PCP to rule out a kidney stone, sts her CT was negative but the pain has increased. Nad, skin warm and dry, resp e/u.

## 2014-04-08 NOTE — ED Provider Notes (Signed)
CSN: [id number]     Arrival date & time 04/08/14  1635 History   First MD Initiated Contact with Patient 04/08/14 2218     Chief Complaint  Patient presents with  . Flank Pain  . Abdominal Pain   [name] is a 43 yo hispanic F w/PMH of HTN, anemia, asthma, and hypothyroidism who presents today with one week of left flank pain and abdominal pain. Patient says pain began last Friday it while at rest it began in her left flank and wraps around to her left abdomen patient says she saw her primary care provider 2 days ago for the same reason and he obtained an abdominal x-ray and urine. Those were negative and she was sent home. Patient began having diarrhea beginning yesterday that is watery and nonbloody. Pain is moderate does not radiate anywhere else and is constant.  She denies CP, SOB, fever, chills, constipation, hematemesis, dysuria, hematuria, vaginal discharge, sick contacts, or recent travel.   (Consider location/radiation/quality/duration/timing/severity/associated sxs/prior Treatment) Patient is a 43 y.o. female presenting with flank pain. The history is provided by the patient.  Flank Pain This is a new problem. The current episode started 1 to 4 weeks ago. The problem occurs constantly. The problem has been unchanged. Associated symptoms include abdominal pain. Pertinent negatives include no chest pain, chills, coughing, diaphoresis, fatigue, fever, headaches, nausea, numbness, rash, sore throat, swollen glands, urinary symptoms, vomiting or weakness. Associated symptoms comments: Diarrhea . Nothing aggravates the symptoms. She has tried nothing for the symptoms. The treatment provided no relief.    Past Medical History  Diagnosis Date  . Hyperthyroidism 10/2000    RAI treatment,  multinodular goiter  . Ectopic pregnancy 04/2006    treated with medication  . Hepatic steatosis 07/2006    on Abd CT done for pain  . 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 Preterm delivery 12/2010    x3, placental abruption, maternal htn, DM  . Hypertension   . Anemia   . ABORTION, SPONTANEOUS 08/18/2007  . IMPAIRED GLUCOSE TOLERANCE 08/18/2007  . Acquired hypothyroidism   . Asthma    Past Surgical History  Procedure Laterality Date  . Appendectomy  1997  . Cholecystectomy  1997  . Ovarian cyst removal    . Laparoscopic lysis intestinal adhesions    . Tubal ligation    . Thyroid surgery      goiter removed  . Cesarean section      x 1  . Laparoscopic hysterectomy  04/24/2012    Procedure: HYSTERECTOMY TOTAL LAPAROSCOPIC;  Surgeon: Terrance Mass, MD;  Location: [facility];  Service: Gynecology;  Laterality: N/A;  2 1/2 hours OR time.  Dr. Phineas Real to assisting  Patient is Spanish speaking.  Thanks!!  . Abdominal hysterectomy  04/24/2012    Procedure: HYSTERECTOMY ABDOMINAL;  Surgeon: Terrance Mass, MD;  Location: [facility];  Service: Gynecology;;   Family History  Problem Relation Age of Onset  . Hypertension Father   . Stroke Father     hemorrhagic, deceased  . Diabetes Mother   . Coronary artery disease Neg Hx   . Cancer Neg Hx    History  Substance Use Topics  . Smoking status: Never Smoker   . Smokeless tobacco: Never Used  . Alcohol Use: No   OB History   Grav Para Term Preterm Abortions TAB SAB Ect Mult Living   5 1  1 3  3    1  Review of Systems  Constitutional: Negative for fever, chills, diaphoresis and fatigue.  HENT: Negative for sore throat.   Respiratory: Negative for cough and shortness of breath.   Cardiovascular: Negative for chest pain, palpitations and leg swelling.  Gastrointestinal: Positive for abdominal pain and diarrhea. Negative for nausea, vomiting, constipation and abdominal distention.  Genitourinary: Positive for flank pain. Negative for dysuria, frequency, hematuria, decreased urine volume, difficulty urinating and dyspareunia.  Skin: Negative for rash.  Neurological: Negative for  dizziness, speech difficulty, weakness, light-headedness, numbness and headaches.  All other systems reviewed and are negative.     Allergies  Review of patient's allergies indicates no known allergies.  Home Medications   Prior to Admission medications   Medication Sig Start Date End Date Taking? Authorizing Provider  amLODipine (NORVASC) 10 MG tablet Take 10 mg by mouth daily.   Yes Historical Provider, MD  hydrochlorothiazide (HYDRODIURIL) 25 MG tablet Take 25 mg by mouth daily.   Yes Historical Provider, MD  HYDROcodone-acetaminophen (NORCO/VICODIN) 5-325 MG per tablet Take 1 tablet by mouth every 6 (six) hours as needed for moderate pain.   Yes Historical Provider, MD  ibuprofen (ADVIL,MOTRIN) 600 MG tablet Take 600 mg by mouth every 6 (six) hours as needed for moderate pain.    Yes Historical Provider, MD  levothyroxine (SYNTHROID, LEVOTHROID) 112 MCG tablet Take 224 mcg by mouth daily before breakfast.   Yes Historical Provider, MD  lisinopril (PRINIVIL,ZESTRIL) 5 MG tablet Take 5 mg by mouth daily.   Yes Historical Provider, MD  loratadine (CLARITIN) 10 MG tablet Take 10 mg by mouth daily.   Yes Historical Provider, MD  lovastatin (MEVACOR) 20 MG tablet Take 20 mg by mouth at bedtime.   Yes Historical Provider, MD  montelukast (SINGULAIR) 10 MG tablet Take 10 mg by mouth at bedtime.   Yes Historical Provider, MD   BP 141/92  Pulse 90  Temp(Src) 98 F (36.7 C) (Oral)  Resp 18  SpO2 98%  LMP 11/12/2011 Physical Exam  Nursing note and vitals reviewed. Constitutional: She appears well-developed and well-nourished. No distress.  HENT:  Head: Normocephalic and atraumatic.  Cardiovascular: Normal rate, regular rhythm, normal heart sounds and intact distal pulses.  Exam reveals no gallop and no friction rub.   No murmur heard. Pulmonary/Chest: Effort normal and breath sounds normal. No respiratory distress. She has no wheezes. She has no rales. She exhibits no tenderness.    Abdominal: Soft. Bowel sounds are normal. She exhibits no distension and no mass. There is tenderness (Left sided and left flank). There is no rebound and no guarding.    Musculoskeletal:       Arms: Lymphadenopathy:    She has no cervical adenopathy.  Skin: Skin is warm and dry. No rash noted. She is not diaphoretic.    ED Course  Procedures (including critical care time) Labs Review Labs Reviewed  URINALYSIS, ROUTINE W REFLEX MICROSCOPIC - Abnormal; Notable for the following:    Color, Urine AMBER (*)    APPearance HAZY (*)    Bilirubin Urine SMALL (*)    All other components within normal limits  CBC WITH DIFFERENTIAL - Abnormal; Notable for the following:    Platelets 148 (*)    All other components within normal limits  COMPREHENSIVE METABOLIC PANEL - Abnormal; Notable for the following:    Potassium 3.2 (*)    Glucose, Bld 131 (*)    ALT 63 (*)    Total Bilirubin 0.2 (*)    All other  components within normal limits  LIPASE, BLOOD  POC URINE PREG, ED    Imaging Review No results found.   EKG Interpretation None      MDM   43 year old Hispanic female here with left flank pain and abdominal pain. Please see history of present illness for details. On exam patient in NAD, AFVSS. Tenderness to palpation over left flank > right flank. Tenderness also in left upper abdomen with no guarding. No lower quadrant pain no pain over her liver.  Will obtain a urine, CBC, CMP, as well as CT abdomen pelvis.  Urine shows no sign of blood to indicate a stone and shows no signs of infection. CBC within normal limits with no elevated white count. CMP within normal limits aside from slightly low potassium and elevated ALT. Patient has no right upper quadrant tenderness or epigastric tenderness to indicate a possible hepatitis or pancreatitis. Upreg negative, lipase negative. Patient given K-Dur to replenish potassium. CT scan pending.  12:41 AM  CT results returned with left ovarian  cyst only. On prior CT scan this cyst was not seen (in 2014). Therefore at this time will continue workup with ultrasound of the ovaries to ensure appropriate blood flow and no torsion. Bimanual exam show no CMT, no masses. Slight TTP over left adnexa. [place] results pending.   2:13 AM  Pt handed over to Dr. Stevie Kern. Awaiting [place] results. If negative, stable for DC home.    Final diagnoses:  Left upper quadrant pain  Left flank pain    Pt was seen under the supervision of Dr. Jeanell [name].    Sherian Maroon, MD 04/09/14 [phone]

## 2014-04-08 NOTE — ED Notes (Signed)
Pt stated she went to doctor on Monday and he prescribed hydrocodone but it has not worked.

## 2014-04-09 ENCOUNTER — Emergency Department (HOSPITAL_COMMUNITY): Payer: 59

## 2014-04-09 ENCOUNTER — Encounter (HOSPITAL_COMMUNITY): Payer: Self-pay

## 2014-04-09 MED ORDER — HYDROCODONE-ACETAMINOPHEN 5-325 MG PO TABS: 2.0000 | ORAL_TABLET | Freq: Four times a day (QID) | ORAL | Status: DC | PRN

## 2014-04-09 MED ORDER — IOHEXOL 300 MG/ML  SOLN
100.0000 mL | Freq: Once | INTRAMUSCULAR | Status: AC | PRN
  Administered 2014-04-09: 100 mL via INTRAVENOUS

## 2014-04-09 MED ORDER — MORPHINE SULFATE 4 MG/ML IJ SOLN
4.0000 mg | Freq: Once | INTRAMUSCULAR | Status: AC
  Administered 2014-04-09: 4 mg via INTRAVENOUS
  Filled 2014-04-09: qty 1

## 2014-04-09 NOTE — Discharge Instructions (Signed)
Abdominal (belly) pain can be caused by many things. Your caregiver performed an examination and possibly ordered blood/urine tests and imaging (CT scan, x-rays, ultrasound). Many cases can be observed and treated at home after initial evaluation in the emergency department. Even though you are being discharged home, abdominal pain can be unpredictable. Therefore, you need a repeated exam if your pain does not resolve, returns, or worsens. Most patients with abdominal pain don't have to be admitted to the hospital or have surgery, but serious problems like appendicitis and gallbladder attacks can start out as nonspecific pain. Many abdominal conditions cannot be diagnosed in one visit, so follow-up evaluations are very important. SEEK IMMEDIATE MEDICAL ATTENTION IF: The pain does not go away or becomes severe.  A temperature above 101 develops.  Repeated vomiting occurs (multiple episodes).  The pain becomes localized to portions of the abdomen. The right side could possibly be appendicitis. In an adult, the left lower portion of the abdomen could be colitis or diverticulitis.  Blood is being passed in stools or vomit (bright red or black tarry stools).  Return also if you develop chest pain, difficulty breathing, dizziness or fainting, or become confused, poorly responsive, or inconsolable (young children). Dolor abdominal (Abdominal Pain) El dolor de estmago (abdominal) puede tener muchas causas. Rapides veces, el dolor de [place] no es peligroso. Muchos de [employer] de dolor de estmago pueden controlarse y tratarse en casa. CUIDADOS EN EL HOGAR   No tome medicamentos que lo ayuden a defecar (laxantes), salvo que su mdico se lo indique.  Solo tome los medicamentos que le haya indicado su mdico.  Coma o beba lo que le indique su mdico. Su mdico le dir si debe seguir una dieta especial. SOLICITE AYUDA SI:  No sabe cul es la causa del dolor de [place].  Tiene dolor de  estmago cuando siente ganas de vomitar (nuseas) o tiene colitis (diarrea).  Tiene dolor durante la miccin o la evacuacin.  El dolor de estmago lo despierta de noche.  Tiene dolor de [employer] empeora o [place] cuando come.  Tiene dolor de [employer] empeora cuando come [employer].  Tiene fiebre. SOLICITE AYUDA DE INMEDIATO SI:   El dolor no desaparece en un plazo mximo de 2horas.  No deja de (vomitar).  El dolor cambia y se Administrator, sports solo en la parte derecha o izquierda del [place].  La materia fecal es sanguinolenta o de aspecto alquitranado. ASEGRESE DE QUE:   Comprende estas instrucciones.  Controlar su afeccin.  Recibir ayuda de inmediato si no mejora o si empeora. Document Released: 11/16/2008 Document Revised: 08/25/2013 [facility] Patient Information 2015 [place]. This information is not intended to replace advice given to you by your health care provider. Make sure you discuss any questions you have with your health care provider.

## 2014-04-09 NOTE — ED Notes (Signed)
Pt went to [place] with female chaperon.

## 2014-04-10 ENCOUNTER — Emergency Department (HOSPITAL_COMMUNITY)
Admission: EM | Admit: 2014-04-10 | Discharge: 2014-04-11 | Disposition: A | Discharge status: Home / Self Care | Payer: 59 | Attending: Emergency Medicine | Admitting: Emergency Medicine

## 2014-04-10 ENCOUNTER — Encounter (HOSPITAL_COMMUNITY): Payer: Self-pay | Admitting: Emergency Medicine

## 2014-04-10 DIAGNOSIS — Z8673 Personal history of transient ischemic attack (TIA), and cerebral infarction without residual deficits: Secondary | ICD-10-CM | POA: Diagnosis not present

## 2014-04-10 DIAGNOSIS — Z8719 Personal history of other diseases of the digestive system: Secondary | ICD-10-CM | POA: Insufficient documentation

## 2014-04-10 DIAGNOSIS — H538 Other visual disturbances: Secondary | ICD-10-CM | POA: Diagnosis not present

## 2014-04-10 DIAGNOSIS — E039 Hypothyroidism, unspecified: Secondary | ICD-10-CM | POA: Diagnosis not present

## 2014-04-10 DIAGNOSIS — R51 Headache: Secondary | ICD-10-CM | POA: Diagnosis not present

## 2014-04-10 DIAGNOSIS — Z8742 Personal history of other diseases of the female genital tract: Secondary | ICD-10-CM | POA: Insufficient documentation

## 2014-04-10 DIAGNOSIS — J45909 Unspecified asthma, uncomplicated: Secondary | ICD-10-CM | POA: Insufficient documentation

## 2014-04-10 DIAGNOSIS — Z862 Personal history of diseases of the blood and blood-forming organs and certain disorders involving the immune mechanism: Secondary | ICD-10-CM | POA: Insufficient documentation

## 2014-04-10 DIAGNOSIS — H539 Unspecified visual disturbance: Secondary | ICD-10-CM

## 2014-04-10 DIAGNOSIS — Z79899 Other long term (current) drug therapy: Secondary | ICD-10-CM | POA: Diagnosis not present

## 2014-04-10 DIAGNOSIS — R519 Headache, unspecified: Secondary | ICD-10-CM

## 2014-04-10 DIAGNOSIS — I10 Essential (primary) hypertension: Secondary | ICD-10-CM | POA: Diagnosis not present

## 2014-04-10 LAB — CBC WITH DIFFERENTIAL/PLATELET
Basophils Absolute: 0 10*3/uL (ref 0.0–0.1)
Basophils Relative: 1 % (ref 0–1)
EOS ABS: 0.1 10*3/uL (ref 0.0–0.7)
Eosinophils Relative: 1 % (ref 0–5)
HCT: 41.7 % (ref 36.0–46.0)
HEMOGLOBIN: 14.1 g/dL (ref 12.0–15.0)
Lymphocytes Relative: 26 % (ref 12–46)
Lymphs Abs: 2.2 10*3/uL (ref 0.7–4.0)
MCH: 29.9 pg (ref 26.0–34.0)
MCHC: 33.8 g/dL (ref 30.0–36.0)
MCV: 88.5 fL (ref 78.0–100.0)
Monocytes Absolute: 0.7 10*3/uL (ref 0.1–1.0)
Monocytes Relative: 8 % (ref 3–12)
NEUTROS PCT: 64 % (ref 43–77)
Neutro Abs: 5.5 10*3/uL (ref 1.7–7.7)
Platelets: 149 10*3/uL — ABNORMAL LOW (ref 150–400)
RBC: 4.71 MIL/uL (ref 3.87–5.11)
RDW: 12.8 % (ref 11.5–15.5)
WBC: 8.5 10*3/uL (ref 4.0–10.5)

## 2014-04-10 LAB — COMPREHENSIVE METABOLIC PANEL
ALBUMIN: 4.1 g/dL (ref 3.5–5.2)
ALK PHOS: 88 U/L (ref 39–117)
ALT: 82 U/L — ABNORMAL HIGH (ref 0–35)
ANION GAP: 15 (ref 5–15)
AST: 40 U/L — ABNORMAL HIGH (ref 0–37)
BUN: 16 mg/dL (ref 6–23)
CO2: 25 mEq/L (ref 19–32)
CREATININE: 0.64 mg/dL (ref 0.50–1.10)
Calcium: 9.2 mg/dL (ref 8.4–10.5)
Chloride: 100 mEq/L (ref 96–112)
GFR calc Af Amer: >90 mL/min (ref >90)
GFR calc non Af Amer: >90 mL/min (ref >90)
Glucose, Bld: 104 mg/dL — ABNORMAL HIGH (ref 70–99)
POTASSIUM: 4 meq/L (ref 3.7–5.3)
Sodium: 140 mEq/L (ref 137–147)
Total Bilirubin: 0.2 mg/dL — ABNORMAL LOW (ref 0.3–1.2)
Total Protein: 7.9 g/dL (ref 6.0–8.3)

## 2014-04-10 NOTE — ED Notes (Signed)
Pt. reports headache onset this evening with blurred vision , denies nausea , no head injury. Alert and oriented /respirations unlabored .

## 2014-04-11 ENCOUNTER — Emergency Department (HOSPITAL_COMMUNITY): Payer: 59

## 2014-04-11 MED ORDER — METOCLOPRAMIDE HCL 10 MG PO TABS: 10.0000 mg | ORAL_TABLET | Freq: Three times a day (TID) | ORAL | Status: DC | PRN

## 2014-04-11 MED ORDER — KETOROLAC TROMETHAMINE 30 MG/ML IJ SOLN
30.0000 mg | Freq: Once | INTRAMUSCULAR | Status: AC
  Administered 2014-04-11: 30 mg via INTRAVENOUS
  Filled 2014-04-11: qty 1

## 2014-04-11 MED ORDER — NAPROXEN 500 MG PO TABS: 500.0000 mg | ORAL_TABLET | Freq: Two times a day (BID) | ORAL | Status: DC

## 2014-04-11 MED ORDER — METOCLOPRAMIDE HCL 5 MG/ML IJ SOLN
10.0000 mg | Freq: Once | INTRAMUSCULAR | Status: AC
  Administered 2014-04-11: 10 mg via INTRAVENOUS
  Filled 2014-04-11: qty 2

## 2014-04-11 NOTE — ED Provider Notes (Signed)
CSN: [id number]     Arrival date & time 04/10/14  2157 History   First MD Initiated Contact with Patient 04/10/14 2346     Chief Complaint  Patient presents with  . Headache     (Consider location/radiation/quality/duration/timing/severity/associated sxs/prior Treatment) HPI Comments: 43 year old female, history of hypertension, stroke, hyperthyroidism. She presents with a complaint of headache which started at 4:00 this afternoon and has been waxing and waning in intensity. She states that this was gradual in onset, is located in the posterior head, crown of the head and radiates to the left side of the face and the eye. It is a throbbing and burning sensation, when the pain gets bad she feels like she has an abnormal sensation in the left eye and feels as though her vision changes. At this time she states that she feels occasional pain but not acute pain at this time. There's no blurry vision, no double vision, and there is occasional floaters and bright lights but not at this time. She denies head injury. She has had no associated difficulty swallowing or speaking, no difficulty walking, talking and has no sensory deficits. Nothing seems to make this better or worse, of note she was seen 2 days ago in the hospital for abdominal pain with an unremarkable workup. Her last headache was approximately 6 months ago at which time she states the headache feels exactly the same but she did not have any visual problems at that time.  Patient is a 43 y.o. female presenting with headaches. The history is provided by the patient, the spouse and medical records.  Headache   Past Medical History  Diagnosis Date  . Hyperthyroidism 10/2000    RAI treatment,  multinodular goiter  . Ectopic pregnancy 04/2006    treated with medication  . Hepatic steatosis 07/2006    on Abd CT done for pain  . 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 Preterm delivery 12/2010    x3, placental  abruption, maternal htn, DM  . Hypertension   . Anemia   . ABORTION, SPONTANEOUS 08/18/2007  . IMPAIRED GLUCOSE TOLERANCE 08/18/2007  . Acquired hypothyroidism   . Asthma   . Ovarian cyst 04/2014   Past Surgical History  Procedure Laterality Date  . Appendectomy  1997  . Cholecystectomy  1997  . Ovarian cyst removal    . Laparoscopic lysis intestinal adhesions    . Tubal ligation    . Thyroid surgery      goiter removed  . Cesarean section      x 1  . Laparoscopic hysterectomy  04/24/2012    Procedure: HYSTERECTOMY TOTAL LAPAROSCOPIC;  Surgeon: Terrance Mass, MD;  Location: [facility];  Service: Gynecology;  Laterality: N/A;  2 1/2 hours OR time.  Dr. Phineas Real to assisting  Patient is Spanish speaking.  Thanks!!  . Abdominal hysterectomy  04/24/2012    Procedure: HYSTERECTOMY ABDOMINAL;  Surgeon: Terrance Mass, MD;  Location: [facility];  Service: Gynecology;;   Family History  Problem Relation Age of Onset  . Hypertension Father   . Stroke Father     hemorrhagic, deceased  . Diabetes Mother   . Coronary artery disease Neg Hx   . Cancer Neg Hx    History  Substance Use Topics  . Smoking status: Never Smoker   . Smokeless tobacco: Never Used  . Alcohol Use: No   OB History   Grav Para Term Preterm Abortions TAB SAB Ect Mult  Living   5 1  1 3  3   1      Review of Systems  Neurological: Positive for headaches.  All other systems reviewed and are negative.     Allergies  Review of patient's allergies indicates no known allergies.  Home Medications   Prior to Admission medications   Medication Sig Start Date End Date Taking? Authorizing Provider  amLODipine (NORVASC) 10 MG tablet Take 10 mg by mouth daily.   Yes Historical Provider, MD  hydrochlorothiazide (HYDRODIURIL) 25 MG tablet Take 25 mg by mouth daily.   Yes Historical Provider, MD  HYDROcodone-acetaminophen (NORCO/VICODIN) 5-325 MG per tablet Take 1 tablet by mouth every 6 (six) hours as needed for  moderate pain.   Yes Historical Provider, MD  levothyroxine (SYNTHROID, LEVOTHROID) 200 MCG tablet Take 225 mcg by mouth daily before breakfast. Take with 69mcg tablet for a total of 225   Yes Historical Provider, MD  levothyroxine (SYNTHROID, LEVOTHROID) 25 MCG tablet Take 225 mcg by mouth daily before breakfast. Take with 263mcg tablet to equal 253mcg   Yes Historical Provider, MD  lisinopril (PRINIVIL,ZESTRIL) 5 MG tablet Take 5 mg by mouth daily.   Yes Historical Provider, MD  loratadine (CLARITIN) 10 MG tablet Take 10 mg by mouth daily.   Yes Historical Provider, MD  lovastatin (MEVACOR) 20 MG tablet Take 20 mg by mouth at bedtime.   Yes Historical Provider, MD  montelukast (SINGULAIR) 10 MG tablet Take 10 mg by mouth at bedtime.   Yes Historical Provider, MD  metoCLOPramide (REGLAN) 10 MG tablet Take 1 tablet (10 mg total) by mouth 3 (three) times daily as needed for nausea (headache / nausea). 04/11/14   Johnna Acosta, MD  naproxen (NAPROSYN) 500 MG tablet Take 1 tablet (500 mg total) by mouth 2 (two) times daily with a meal. 04/11/14   Johnna Acosta, MD   BP 165/109  Pulse 97  Temp(Src) 98.5 F (36.9 C) (Oral)  Resp 18  SpO2 98%  LMP 11/12/2011 Physical Exam  Nursing note and vitals reviewed. Constitutional: She appears well-developed and well-nourished. No distress.  HENT:  Head: Normocephalic and atraumatic.  Mouth/Throat: Oropharynx is clear and moist. No oropharyngeal exudate.  Eyes: Conjunctivae and EOM are normal. Pupils are equal, round, and reactive to light. Right eye exhibits no discharge. Left eye exhibits no discharge. No scleral icterus.  Normal pupillary exam, normal consensual response, no injected conjunctiva, posterior exam reveals sharp optic discs bilaterally, no obvious retinal hemorrhages or tears  Neck: Normal range of motion. Neck supple. No JVD present. No thyromegaly present.  Cardiovascular: Normal rate, regular rhythm, normal heart sounds and intact distal  pulses.  Exam reveals no gallop and no friction rub.   No murmur heard. Pulmonary/Chest: Effort normal and breath sounds normal. No respiratory distress. She has no wheezes. She has no rales.  Abdominal: Soft. Bowel sounds are normal. She exhibits no distension and no mass. There is no tenderness.  Musculoskeletal: Normal range of motion. She exhibits no edema and no tenderness.  Lymphadenopathy:    She has no cervical adenopathy.  Neurological: She is alert. Coordination normal.  Speech is clear, cranial nerves III through XII are intact, peripheral visual fields are completely normal in all visual fields, no lip droop, ptosis or facial droop. Normal gait, normal speech, normal strength and sensation of the upper and lower extremities. No pronator drift.  Skin: Skin is warm and dry. No rash noted. No erythema.  Psychiatric: She has a  normal mood and affect. Her behavior is normal.    ED Course  Procedures (including critical care time) Labs Review Labs Reviewed  CBC WITH DIFFERENTIAL - Abnormal; Notable for the following:    Platelets 149 (*)    All other components within normal limits  COMPREHENSIVE METABOLIC PANEL - Abnormal; Notable for the following:    Glucose, Bld 104 (*)    AST 40 (*)    ALT 82 (*)    Total Bilirubin 0.2 (*)    All other components within normal limits    Imaging Review Ct Head Wo Contrast  04/11/2014   CLINICAL DATA:  Headache, with blurred vision.  EXAM: CT HEAD WITHOUT CONTRAST  TECHNIQUE: Contiguous axial images were obtained from the base of the skull through the vertex without intravenous contrast.  COMPARISON:  CT of the head performed 03/15/2007  FINDINGS: There is no evidence of acute infarction, mass lesion, or intra- or extra-axial hemorrhage on CT.  A chronic lacunar infarct is seen within the left basal ganglia.  The posterior fossa, including the cerebellum, brainstem and fourth ventricle, is within normal limits. The third and lateral ventricles  are unremarkable in appearance. The cerebral hemispheres are symmetric in appearance, with normal gray-white differentiation. No mass effect or midline shift is seen.  There is no evidence of fracture; visualized osseous structures are unremarkable in appearance. The orbits are within normal limits. The paranasal sinuses and mastoid air cells are well-aerated. No significant soft tissue abnormalities are seen.  IMPRESSION: 1. No acute intracranial pathology seen on CT. 2. Chronic lacunar infarct within the left basal ganglia.   Electronically Signed   By: Garald Balding M.D.   On: 04/11/2014 00:55      MDM   Final diagnoses:  Acute nonintractable headache, unspecified headache type  Visual changes  Essential hypertension    The patient has a recurrent headache, there is some neurologic symptoms given her visual changes however this is not appreciable on clinical exam, visual acuity will be ordered, CT scan of the head given the patient's history of stroke area and she does have significant hypertension at this time. She will need observation, headache medications and reevaluation. Her symptoms started at 4:00 PM, she does not qualify to be a code stroke.  After pain medications the patient states that her headache is now 4/10 in set of 10 out of 10 and her visual symptoms have completely resolved. Visual acuity testing shows that the patient has terrible vision in her right eye which is 20/200, she states this is normal for her over the last year and not related to today's visit. Her vision in the left eye is 20/30, clinically the patient states that it is back to normal for her. She will be discharged home with instructions to followup with ophthalmology as well as with her family doctor, her blood pressure has been elevated at times, it is improved prior to discharge.    Meds given in ED:  Medications  ketorolac (TORADOL) 30 MG/ML injection 30 mg (30 mg Intravenous Given 04/11/14 0020)   metoCLOPramide (REGLAN) injection 10 mg (10 mg Intravenous Given 04/11/14 0019)    New Prescriptions   METOCLOPRAMIDE (REGLAN) 10 MG TABLET    Take 1 tablet (10 mg total) by mouth 3 (three) times daily as needed for nausea (headache / nausea).   NAPROXEN (NAPROSYN) 500 MG TABLET    Take 1 tablet (500 mg total) by mouth 2 (two) times daily with a meal.  Johnna Acosta, MD 04/11/14 [phone]

## 2014-04-11 NOTE — ED Notes (Signed)
Pt A&Ox4, ambulatory at d/c with steady gait, reports she feels a lot better and denies pain. NAD.

## 2014-04-11 NOTE — Discharge Instructions (Signed)
Please call your doctor for a followup appointment within 24-48 hours. When you talk to your doctor please let them know that you were seen in the emergency department and have them acquire all of your records so that they can discuss the findings with you and formulate a treatment plan to fully care for your new and ongoing problems. ° °

## 2014-04-12 NOTE — ED Provider Notes (Signed)
43 y.o. Female with left flank pain.  Patient seen at private mds office today and kub done.  Patient with normal ct except left ovarian cyst.   I performed a history and physical examination of [name] and discussed her management with Dr. Tamala Julian.  I agree with the history, physical, assessment, and plan of care, with the following exceptions: None  I was present for the following procedures: None Time Spent in Critical Care of the patient: None Time spent in discussions with the patient and family: 5  Adesuwa Osgood Shelda Jakes, MD 04/12/14 [phone]

## 2014-05-03 ENCOUNTER — Encounter (HOSPITAL_COMMUNITY): Payer: Self-pay | Admitting: Emergency Medicine

## 2014-05-03 ENCOUNTER — Emergency Department (HOSPITAL_COMMUNITY)
Admission: EM | Admit: 2014-05-03 | Discharge: 2014-05-03 | Disposition: A | Discharge status: Home / Self Care | Payer: 59 | Source: Home / Self Care | Attending: Family Medicine | Admitting: Family Medicine

## 2014-05-03 DIAGNOSIS — K0889 Other specified disorders of teeth and supporting structures: Secondary | ICD-10-CM

## 2014-05-03 DIAGNOSIS — K089 Disorder of teeth and supporting structures, unspecified: Secondary | ICD-10-CM

## 2014-05-03 MED ORDER — CLINDAMYCIN HCL 300 MG PO CAPS: 300.0000 mg | ORAL_CAPSULE | Freq: Three times a day (TID) | ORAL | Status: DC

## 2014-05-03 MED ORDER — HYDROCODONE-ACETAMINOPHEN 5-325 MG PO TABS: 1.0000 | ORAL_TABLET | ORAL | Status: DC | PRN

## 2014-05-03 NOTE — ED Provider Notes (Signed)
CSN: [id number]     Arrival date & time 05/03/14  1908 History   First MD Initiated Contact with Patient 05/03/14 1915     Chief Complaint  Patient presents with  . Dental Problem   (Consider location/radiation/quality/duration/timing/severity/associated sxs/prior Treatment) Patient is a 43 y.o. female presenting with tooth pain. The history is provided by the patient and the spouse. The history is limited by a language barrier. Language interpreter used: husband trans.  Dental Pain Location:  Upper Upper teeth location:  3/RU 1st molar Quality:  Sharp and radiating Severity:  Moderate Onset quality:  Gradual Duration:  3 days Chronicity:  New Context: recent dental surgery   Associated symptoms: facial pain   Associated symptoms: no fever     Past Medical History  Diagnosis Date  . Hyperthyroidism 10/2000    RAI treatment,  multinodular goiter  . Ectopic pregnancy 04/2006    treated with medication  . Hepatic steatosis 07/2006    on Abd CT done for pain  . 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 Preterm delivery 12/2010    x3, placental abruption, maternal htn, DM  . Hypertension   . Anemia   . ABORTION, SPONTANEOUS 08/18/2007  . IMPAIRED GLUCOSE TOLERANCE 08/18/2007  . Acquired hypothyroidism   . Asthma   . Ovarian cyst 04/2014   Past Surgical History  Procedure Laterality Date  . Appendectomy  1997  . Cholecystectomy  1997  . Ovarian cyst removal    . Laparoscopic lysis intestinal adhesions    . Tubal ligation    . Thyroid surgery      goiter removed  . Cesarean section      x 1  . Laparoscopic hysterectomy  04/24/2012    Procedure: HYSTERECTOMY TOTAL LAPAROSCOPIC;  Surgeon: Terrance Mass, MD;  Location: [facility];  Service: Gynecology;  Laterality: N/A;  2 1/2 hours OR time.  Dr. Phineas Real to assisting  Patient is Spanish speaking.  Thanks!!  . Abdominal hysterectomy  04/24/2012    Procedure: HYSTERECTOMY ABDOMINAL;  Surgeon:  Terrance Mass, MD;  Location: [facility];  Service: Gynecology;;   Family History  Problem Relation Age of Onset  . Hypertension Father   . Stroke Father     hemorrhagic, deceased  . Diabetes Mother   . Coronary artery disease Neg Hx   . Cancer Neg Hx    History  Substance Use Topics  . Smoking status: Never Smoker   . Smokeless tobacco: Never Used  . Alcohol Use: No   OB History   Grav Para Term Preterm Abortions TAB SAB Ect Mult Living   5 1  1 3  3   1      Review of Systems  Constitutional: Negative.  Negative for fever.  HENT: Positive for dental problem.     Allergies  Review of patient's allergies indicates no known allergies.  Home Medications   Prior to Admission medications   Medication Sig Start Date End Date Taking? Authorizing Provider  amLODipine (NORVASC) 10 MG tablet Take 10 mg by mouth daily.    Historical Provider, MD  clindamycin (CLEOCIN) 300 MG capsule Take 1 capsule (300 mg total) by mouth 3 (three) times daily. 05/03/14   Billy Fischer, MD  hydrochlorothiazide (HYDRODIURIL) 25 MG tablet Take 25 mg by mouth daily.    Historical Provider, MD  HYDROcodone-acetaminophen (NORCO/VICODIN) 5-325 MG per tablet Take 1 tablet by mouth every 6 (six) hours as needed for moderate  pain.    Historical Provider, MD  HYDROcodone-acetaminophen (NORCO/VICODIN) 5-325 MG per tablet Take 1 tablet by mouth every 4 (four) hours as needed for moderate pain or severe pain. 05/03/14   Billy Fischer, MD  levothyroxine (SYNTHROID, LEVOTHROID) 200 MCG tablet Take 225 mcg by mouth daily before breakfast. Take with 7mcg tablet for a total of 225    Historical Provider, MD  levothyroxine (SYNTHROID, LEVOTHROID) 25 MCG tablet Take 225 mcg by mouth daily before breakfast. Take with 244mcg tablet to equal 237mcg    Historical Provider, MD  lisinopril (PRINIVIL,ZESTRIL) 5 MG tablet Take 5 mg by mouth daily.    Historical Provider, MD  loratadine (CLARITIN) 10 MG tablet Take 10 mg by mouth  daily.    Historical Provider, MD  lovastatin (MEVACOR) 20 MG tablet Take 20 mg by mouth at bedtime.    Historical Provider, MD  metoCLOPramide (REGLAN) 10 MG tablet Take 1 tablet (10 mg total) by mouth 3 (three) times daily as needed for nausea (headache / nausea). 04/11/14   Johnna Acosta, MD  montelukast (SINGULAIR) 10 MG tablet Take 10 mg by mouth at bedtime.    Historical Provider, MD  naproxen (NAPROSYN) 500 MG tablet Take 1 tablet (500 mg total) by mouth 2 (two) times daily with a meal. 04/11/14   Johnna Acosta, MD   BP 148/101  Pulse 90  Temp(Src) 98.3 F (36.8 C) (Oral)  Resp 18  SpO2 97%  LMP 11/12/2011 Physical Exam  Nursing note and vitals reviewed. Constitutional: She is oriented to person, place, and time. She appears well-developed and well-nourished.  HENT:  Mouth/Throat:    Neck: Normal range of motion. Neck supple.  Lymphadenopathy:    She has no cervical adenopathy.  Neurological: She is alert and oriented to person, place, and time.  Skin: Skin is warm and dry.    ED Course  Procedures (including critical care time) Labs Review Labs Reviewed - No data to display  Imaging Review No results found.   MDM   1. Pain, dental        Billy Fischer, MD 05/03/14 2027

## 2014-05-03 NOTE — ED Notes (Signed)
Had dental extraction on Friday, but has continued pain , bleeding

## 2014-05-03 NOTE — Discharge Instructions (Signed)
Take medicine as prescribed, see your dentist at [facility] as advised.

## 2014-06-07 ENCOUNTER — Encounter: Payer: Self-pay | Admitting: Family Medicine

## 2014-06-07 ENCOUNTER — Ambulatory Visit (INDEPENDENT_AMBULATORY_CARE_PROVIDER_SITE_OTHER): Payer: 59 | Admitting: Family Medicine

## 2014-06-07 VITALS — BP 130/92 | HR 88 | Temp 98.2°F | Wt 167.0 lb

## 2014-06-07 DIAGNOSIS — I10 Essential (primary) hypertension: Secondary | ICD-10-CM

## 2014-06-07 DIAGNOSIS — R1010 Upper abdominal pain, unspecified: Secondary | ICD-10-CM

## 2014-06-07 DIAGNOSIS — E785 Hyperlipidemia, unspecified: Secondary | ICD-10-CM

## 2014-06-07 DIAGNOSIS — E039 Hypothyroidism, unspecified: Secondary | ICD-10-CM

## 2014-06-07 MED ORDER — OMEPRAZOLE 40 MG PO CPDR: 40.0000 mg | DELAYED_RELEASE_CAPSULE | Freq: Every day | ORAL | Status: DC

## 2014-06-07 MED ORDER — LOVASTATIN 20 MG PO TABS: 20.0000 mg | ORAL_TABLET | Freq: Every day | ORAL | Status: DC

## 2014-06-07 MED ORDER — SUCRALFATE 1 GM/10ML PO SUSP: 1.0000 g | Freq: Three times a day (TID) | ORAL | Status: DC

## 2014-06-07 NOTE — Progress Notes (Signed)
Pre visit review using our clinic review tool, if applicable. No additional management support is needed unless otherwise documented below in the visit note. 

## 2014-06-07 NOTE — Progress Notes (Signed)
BP 130/92  Pulse 88  Temp(Src) 98.2 F (36.8 C) (Oral)  Wt 167 lb (75.751 kg)  LMP 11/12/2011   CC: abd pain  Subjective:    Patient ID: [name], female    DOB: 1970/10/19, 43 y.o.   MRN: [id number]  HPI: [name] is a 43 y.o. female presenting on 06/07/2014 for Abdominal Pain   1.5 wk h/o cramping upper abd pain associated with mucousy diarrhea, no blood. Not more malodorous than normal. Today has stooled x5. Diarrhea wakes her up. Has awoken several nights to vomit, but NBNB. Also having some sharp stabbing sternal chest pains. Occasional back pains. Feels fatigued. Some early satiety. appetite ok. Some LUQ pain with each meal. Has never seen gastroenterologist. No water brash or GERD.  No fevers/chills, dysphagia. No urinary symptoms. Has changed diet - less greasy foods, more fruits/vegetables, but persistent pain.  Seen at [facility] wks ago with dental infection after tooth removal. Treated with some abx.   04/2014 - L flank pain s/p stable CT scan: CT ABDOMEN AND PELVIS WITH CONTRAST  TECHNIQUE:  Multidetector CT imaging of the abdomen and pelvis was performed  using the standard protocol following bolus administration of  intravenous contrast.  CONTRAST: 147mL OMNIPAQUE IOHEXOL 300 MG/ML SOLN  COMPARISON: CT of the abdomen and pelvis May 23, 2013  FINDINGS:  LUNG BASES: Included view of the lung bases are clear. Visualized  heart and pericardium are unremarkable.  SOLID ORGANS: The spleen, pancreas and adrenal glands are  unremarkable. The liver is diffusely hypodense consistent with fatty  infiltration and otherwise unremarkable. Status post  cholecystectomy.  GASTROINTESTINAL TRACT: The stomach, small and large bowel are  normal in course and caliber without inflammatory changes. Status  post appendectomy.  KIDNEYS/ URINARY TRACT: Kidneys are orthotopic, demonstrating  symmetric enhancement. No nephrolithiasis, hydronephrosis or solid  renal masses.  The unopacified ureters are normal in course and  caliber. Delayed imaging through the kidneys demonstrates symmetric  prompt contrast excretion within the proximal urinary collecting  system. Urinary bladder is partially distended and unremarkable.  PERITONEUM/RETROPERITONEUM: No intraperitoneal free fluid nor free  air. Aortoiliac vessels are normal in course and caliber. No  lymphadenopathy by CT size criteria. Status post hysterectomy ; new  benign appearing left paraovarian 15 mm cyst.  SOFT TISSUE/OSSEOUS STRUCTURES: Nonsuspicious. Abdominal wall  scarring. Gluteal injection granulomas.  IMPRESSION:  15 mm benign appearing left paraovarian cyst, status post  hysterectomy, cholecystectomy and appendectomy.  Fatty liver.  Electronically Signed  By: Elon Alas  On: 04/09/2014 00:28  Lab Results  Component Value Date   TSH 1.74 11/12/2013    Relevant past medical, surgical, family and social history reviewed and updated as indicated.  Allergies and medications reviewed and updated. Current Outpatient Prescriptions on File Prior to Visit  Medication Sig  . amLODipine (NORVASC) 10 MG tablet Take 10 mg by mouth daily.  . hydrochlorothiazide (HYDRODIURIL) 25 MG tablet Take 25 mg by mouth daily.  Marland Kitchen levothyroxine (SYNTHROID, LEVOTHROID) 200 MCG tablet Take 225 mcg by mouth daily before breakfast. Take with 70mcg tablet for a total of 225  . levothyroxine (SYNTHROID, LEVOTHROID) 25 MCG tablet Take 225 mcg by mouth daily before breakfast. Take with 258mcg tablet to equal 270mcg  . lisinopril (PRINIVIL,ZESTRIL) 5 MG tablet Take 5 mg by mouth daily.  Marland Kitchen loratadine (CLARITIN) 10 MG tablet Take 10 mg by mouth daily.  . naproxen (NAPROSYN) 500 MG tablet Take 1 tablet (500 mg total) by mouth 2 (two)  times daily with a meal.   No current facility-administered medications on file prior to visit.   Past Medical History  Diagnosis Date  . Hyperthyroidism 10/2000    RAI treatment,   multinodular goiter  . Ectopic pregnancy 04/2006    treated with medication  . Hepatic steatosis 07/2006    on Abd CT done for pain  . 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 Preterm delivery 12/2010    x3, placental abruption, maternal htn, DM  . Hypertension   . Anemia   . ABORTION, SPONTANEOUS 08/18/2007  . IMPAIRED GLUCOSE TOLERANCE 08/18/2007  . Acquired hypothyroidism   . Asthma   . Ovarian cyst 04/2014   Review of Systems Per HPI unless specifically indicated above    Objective:    BP 130/92  Pulse 88  Temp(Src) 98.2 F (36.8 C) (Oral)  Wt 167 lb (75.751 kg)  LMP 11/12/2011  Physical Exam  Nursing note and vitals reviewed. Constitutional: She appears well-developed and well-nourished. No distress.  HENT:  Mouth/Throat: Oropharynx is clear and moist. No oropharyngeal exudate.  Cardiovascular: Normal rate, regular rhythm, normal heart sounds and intact distal pulses.   No murmur heard. Pulmonary/Chest: Effort normal and breath sounds normal. No respiratory distress. She has no wheezes. She has no rales.  Abdominal: Soft. Normal appearance and bowel sounds are normal. She exhibits no distension and no mass. There is no hepatosplenomegaly. There is tenderness in the epigastric area, suprapubic area and left upper quadrant. There is no rigidity, no rebound, no guarding, no CVA tenderness and negative Murphy's sign. No hernia.  Musculoskeletal: She exhibits no edema.  Skin: Skin is warm and dry. No rash noted.  Psychiatric: She has a normal mood and affect.   Results for orders placed during the hospital encounter of 04/10/14  CBC WITH DIFFERENTIAL      Result Value Ref Range   WBC 8.5  4.0 - 10.5 K/uL   RBC 4.71  3.87 - 5.11 MIL/uL   Hemoglobin 14.1  12.0 - 15.0 g/dL   HCT 41.7  36.0 - 46.0 %   MCV 88.5  78.0 - 100.0 fL   MCH 29.9  26.0 - 34.0 pg   MCHC 33.8  30.0 - 36.0 g/dL   RDW 12.8  11.5 - 15.5 %   Platelets 149 (*) 150 - 400 K/uL    Neutrophils Relative % 64  43 - 77 %   Neutro Abs 5.5  1.7 - 7.7 K/uL   Lymphocytes Relative 26  12 - 46 %   Lymphs Abs 2.2  0.7 - 4.0 K/uL   Monocytes Relative 8  3 - 12 %   Monocytes Absolute 0.7  0.1 - 1.0 K/uL   Eosinophils Relative 1  0 - 5 %   Eosinophils Absolute 0.1  0.0 - 0.7 K/uL   Basophils Relative 1  0 - 1 %   Basophils Absolute 0.0  0.0 - 0.1 K/uL  COMPREHENSIVE METABOLIC PANEL      Result Value Ref Range   Sodium 140  137 - 147 mEq/L   Potassium 4.0  3.7 - 5.3 mEq/L   Chloride 100  96 - 112 mEq/L   CO2 25  19 - 32 mEq/L   Glucose, Bld 104 (*) 70 - 99 mg/dL   BUN 16  6 - 23 mg/dL   Creatinine, Ser 0.64  0.50 - 1.10 mg/dL   Calcium 9.2  8.4 - 10.5 mg/dL  Total Protein 7.9  6.0 - 8.3 g/dL   Albumin 4.1  3.5 - 5.2 g/dL   AST 40 (*) 0 - 37 U/L   ALT 82 (*) 0 - 35 U/L   Alkaline Phosphatase 88  39 - 117 U/L   Total Bilirubin 0.2 (*) 0.3 - 1.2 mg/dL   GFR calc non Af Amer >90  >90 mL/min   GFR calc Af Amer >90  >90 mL/min   Anion gap 15  5 - 15      Assessment & Plan:   Problem List Items Addressed This Visit   Upper abdominal pain - Primary     Anticipate due to PUD or dyspepsia. H/o recurrent abdominal pain. Check CBC, CMP, lipase and H pylori today. Treat with omeprazole and sucralfate suspension. Update if not improved with treatment for GI referral. Red flags to seek urgent care discussed Pt agrees with plan.    Relevant Orders      Comprehensive metabolic panel      CBC with Differential      Lipase      H. pylori antibody, IgG   Hypothyroidism     Recheck TSH today.    Relevant Orders      TSH   HYPERTENSION, BENIGN SYSTEMIC   Relevant Medications      lovastatin (MEVACOR) tablet       Follow up plan: Return if symptoms worsen or fail to improve.

## 2014-06-07 NOTE — Patient Instructions (Addendum)
creo que tiene posible ulcera en el estomago. Tome omeprazole 40mg  diarios por 3 semanas para Best boy. Tambien puede tratar carafate suspension. Coma comida suave - hasta dieta liquida por 1-2 dia (caldo de pollo, gelatina, y ginger ale). sangre hoy - y revisaremos por bacteria llamada Helicobacter pylori  lcera pptica  (Peptic Ulcer)  La lcera pptica es una llaga dolorosa en la membrana que recubre el esfago (lcera esofgica), el estmago (lcera gstrica), o la primera parte del intestino delgado (lcera duodenal). La lcera causa erosin en los tejidos profundos.  CAUSAS  Normalmente, el revestimiento del estmago y del intestino delgado se protegen a s mismos del cido con que se digieren los alimentos. El revestimiento protector puede daarse debido a:   Una infeccin causada por una bacteria llamada Helicobacter pylori (H. pylori).  El uso regular de medicamentos anti-inflamatorios no esteroides (AINE), como el ibuprofeno o la aspirina.  El consumo de tabaco. Otros factores de riesgo incluyen ser mayor de 28 aos, el consumo de alcohol en exceso y Raynelle Jan antecedentes familiares de lcera.  SNTOMAS   Dolor quemante o punzante en la zona entre el pecho y el ombligo.  Acidez.  Nuseas y vmitos.  Hinchazn. El dolor empeora con el estmago vaco y por la noche. Si la lcera sangra, puede causar:   Materia fecal de color negro alquitranado.  Vmito de sangre roja brillante.  Vmito de aspecto similar a la borra del caf. DIAGNSTICO  El diagnstico se realiza basndose en la historia clnica y el examen fsico. Para encontrar las causas de la lcera podrn indicarle otros estudios y procedimientos. Encontrar la causa ayudar a Adult nurse. Los estudios y procedimientos pueden incluir:   Anlisis de sangre, anlisis de materia fecal, o estudios del aliento para Hydrographic surveyor la bacteria H. pylori.  Una seriada del tracto gastrointestinal (GI) del  esfago, el estmago y el intestino delgado.  Una endoscopia para examinar el esfago, el estmago y el intestino delgado.  Una biopsia. TRATAMIENTO  El tratamiento incluye:   La eliminacin de la causa de la lcera, como el tabaquismo, los Holcomb o el alcohol.  Medicamentos para reducir la cantidad de cido en el tracto digestivo.  Antibiticos si la causa de la lcera es la bacteria H. pylori.  Una endoscopia superior para tratar Milda Smart sangrante.  Ciruga si el sangrado es grave o si la lcera ha perforado Secondary school teacher en el sistema digestivo. INSTRUCCIONES PARA EL CUIDADO EN EL HOGAR   Evite el tabaco, el alcohol y la cafena. El fumar puede aumentar el cido en el estmago y el tabaquismo continuado no favorecer la curacin de las lceras.  Evite los alimentos y las bebidas que le parece que le causan molestias o que le agravan su lcera.  Tome slo la medicacin que le indic el profesional. No tome sustitutos de venta libre de los medicamentos recetados sin Teacher, adult education a su mdico.  Cumpla con las consultas de control y hgase los estudios segn las indicaciones. SOLICITE ATENCIN MDICA SI:   La infeccin no mejora dentro de los 7 das despus de Orthoptist.  Siente indigestin o [place] continua. SOLICITE ATENCIN MDICA DE INMEDIATO SI:   Siente un dolor repentino y agudo o persistente en el abdomen.  La materia fecal es sanguinolenta o negra, de aspecto alquitranado.  Vomita sangre o el vmito tiene el aspecto similar a la borra del caf.  Si se siente mareado, dbil o que va a desmayarse.  Se siente transpirado o sudoroso. ASEGRESE DE QUE:   Comprende estas instrucciones.  Controlar su enfermedad.  Solicitar ayuda de inmediato si no mejora o si empeora. Document Released: 05/30/2005 Document Revised: 05/14/2012 [facility] Patient Information 2015 [place]. This information is not intended to replace advice given to you by your  health care provider. Make sure you discuss any questions you have with your health care provider.

## 2014-06-07 NOTE — Assessment & Plan Note (Signed)
Recheck TSH today.  

## 2014-06-07 NOTE — Assessment & Plan Note (Signed)
Anticipate due to PUD or dyspepsia. H/o recurrent abdominal pain. Check CBC, CMP, lipase and H pylori today. Treat with omeprazole and sucralfate suspension. Update if not improved with treatment for GI referral. Red flags to seek urgent care discussed Pt agrees with plan.

## 2014-06-08 LAB — TSH: TSH: 1.8 u[IU]/mL (ref 0.35–4.50)

## 2014-06-08 LAB — COMPREHENSIVE METABOLIC PANEL
ALBUMIN: 4.1 g/dL (ref 3.5–5.2)
ALT: 81 U/L — AB (ref 0–35)
AST: 42 U/L — AB (ref 0–37)
Alkaline Phosphatase: 73 U/L (ref 39–117)
BUN: 22 mg/dL (ref 6–23)
CALCIUM: 9.1 mg/dL (ref 8.4–10.5)
CHLORIDE: 107 meq/L (ref 96–112)
CO2: 29 meq/L (ref 19–32)
Creatinine, Ser: 0.7 mg/dL (ref 0.4–1.2)
GFR: 92.22 mL/min (ref >60.00)
Glucose, Bld: 98 mg/dL (ref 70–99)
POTASSIUM: 4.2 meq/L (ref 3.5–5.1)
SODIUM: 143 meq/L (ref 135–145)
TOTAL PROTEIN: 7.9 g/dL (ref 6.0–8.3)
Total Bilirubin: 0.4 mg/dL (ref 0.2–1.2)

## 2014-06-08 LAB — CBC WITH DIFFERENTIAL/PLATELET
BASOS ABS: 0.1 10*3/uL (ref 0.0–0.1)
Basophils Relative: 1 % (ref 0.0–3.0)
EOS ABS: 0.1 10*3/uL (ref 0.0–0.7)
Eosinophils Relative: 1.7 % (ref 0.0–5.0)
HCT: 41.3 % (ref 36.0–46.0)
Hemoglobin: 13.8 g/dL (ref 12.0–15.0)
Lymphocytes Relative: 19 % (ref 12.0–46.0)
Lymphs Abs: 1.6 10*3/uL (ref 0.7–4.0)
MCHC: 33.5 g/dL (ref 30.0–36.0)
MCV: 89.8 fl (ref 78.0–100.0)
MONO ABS: 0.8 10*3/uL (ref 0.1–1.0)
Monocytes Relative: 9.1 % (ref 3.0–12.0)
NEUTROS ABS: 5.9 10*3/uL (ref 1.4–7.7)
Neutrophils Relative %: 69.2 % (ref 43.0–77.0)
Platelets: 153 10*3/uL (ref 150.0–400.0)
RBC: 4.6 Mil/uL (ref 3.87–5.11)
RDW: 13.6 % (ref 11.5–15.5)
WBC: 8.5 10*3/uL (ref 4.0–10.5)

## 2014-06-08 LAB — LIPASE: Lipase: 34 U/L (ref 11.0–59.0)

## 2014-06-08 LAB — H. PYLORI ANTIBODY, IGG: H Pylori IgG: NEGATIVE

## 2014-06-09 ENCOUNTER — Encounter: Payer: Self-pay | Admitting: Family Medicine

## 2014-06-09 ENCOUNTER — Encounter: Payer: Self-pay | Admitting: *Deleted

## 2014-07-05 ENCOUNTER — Encounter: Payer: Self-pay | Admitting: Family Medicine

## 2014-08-12 ENCOUNTER — Other Ambulatory Visit: Payer: Self-pay | Admitting: Family Medicine

## 2014-09-21 ENCOUNTER — Ambulatory Visit (INDEPENDENT_AMBULATORY_CARE_PROVIDER_SITE_OTHER): Payer: 59 | Admitting: Internal Medicine

## 2014-09-21 ENCOUNTER — Encounter: Payer: Self-pay | Admitting: Internal Medicine

## 2014-09-21 VITALS — BP 138/96 | HR 78 | Temp 98.0°F | Wt 164.0 lb

## 2014-09-21 DIAGNOSIS — R198 Other specified symptoms and signs involving the digestive system and abdomen: Secondary | ICD-10-CM

## 2014-09-21 DIAGNOSIS — R140 Abdominal distension (gaseous): Secondary | ICD-10-CM

## 2014-09-21 DIAGNOSIS — R1012 Left upper quadrant pain: Secondary | ICD-10-CM

## 2014-09-21 DIAGNOSIS — R110 Nausea: Secondary | ICD-10-CM

## 2014-09-21 NOTE — Patient Instructions (Signed)
Bloating Bloating is the feeling of fullness in your belly. You may feel as though your pants are too tight. Often the cause of bloating is overeating, retaining fluids, or having gas in your bowel. It is also caused by swallowing air and eating foods that cause gas. Irritable bowel syndrome is one of the most common causes of bloating. Constipation is also a common cause. Sometimes more serious problems can cause bloating. SYMPTOMS  Usually there is a feeling of fullness, as though your abdomen is bulged out. There may be mild discomfort.  DIAGNOSIS  Usually no particular testing is necessary for most bloating. If the condition persists and seems to become worse, your caregiver may do additional testing.  TREATMENT   There is no direct treatment for bloating.  Do not put gas into the bowel. Avoid chewing gum and sucking on candy. These tend to make you swallow air. Swallowing air can also be a nervous habit. Try to avoid this.  Avoiding high residue diets will help. Eat foods with soluble fibers (examples include root vegetables, apples, or barley) and substitute dairy products with soy and rice products. This helps irritable bowel syndrome.  If constipation is the cause, then a high residue diet with more fiber will help.  Avoid carbonated beverages.  Over-the-counter preparations are available that help reduce gas. Your pharmacist can help you with this. SEEK MEDICAL CARE IF:   Bloating continues and seems to be getting worse.  You notice a weight gain.  You have a weight loss but the bloating is getting worse.  You have changes in your bowel habits or develop nausea or vomiting. SEEK IMMEDIATE MEDICAL CARE IF:   You develop shortness of breath or swelling in your legs.  You have an increase in abdominal pain or develop chest pain. Document Released: 06/20/2006 Document Revised: 11/12/2011 Document Reviewed: 08/08/2007 [employer] Patient Information 2015 [employer]. This  information is not intended to replace advice given to you by your health care provider. Make sure you discuss any questions you have with your health care provider.  

## 2014-09-21 NOTE — Progress Notes (Signed)
Pre visit review using our clinic review tool, if applicable. No additional management support is needed unless otherwise documented below in the visit note. 

## 2014-09-21 NOTE — Progress Notes (Signed)
Subjective:  Patient ID: [name], female    DOB: July 14, 1971, 44 y.o.   MRN: [id number]  HPI  [name] is a 44 yo female who presents to clinic w/ c/o LLQ pain x 4 days and bloating.  Pain is dull and constant throughout most of the day but sharpens and and radiates to the left middle side of her upper back at night. On a scale of 1-10, her LLQ pain is currently a 6. Pain has been a daily occurrence for 3 months and changes in intensity depending on the day. No nausea accompanies the pain. Eating worsens the pain, making her feel excess gas and bloating but appetite is okay. BM's improve her pain and bloating. She has alternating diarrhea and constipation. Feels fatigued. No improvement of symptoms w/ Carafate or omeprazole Rx 3 months ago or OTC meds for upset stomach or constipation.     Review of Systems    Past Medical History  Diagnosis Date  . Hyperthyroidism 10/2000    RAI treatment,  multinodular goiter  . Ectopic pregnancy 04/2006    treated with medication  . Hepatic steatosis 07/2006, 04/2014    on Abd CT done for pain  . 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 Preterm delivery 12/2010    x3, placental abruption, maternal htn, DM  . Hypertension   . Anemia   . ABORTION, SPONTANEOUS 08/18/2007  . IMPAIRED GLUCOSE TOLERANCE 08/18/2007  . Acquired hypothyroidism   . Asthma   . Ovarian cyst 04/2014    Current Outpatient Prescriptions  Medication Sig Dispense Refill  . amLODipine (NORVASC) 10 MG tablet Take 10 mg by mouth daily.    . hydrochlorothiazide (HYDRODIURIL) 25 MG tablet Take 25 mg by mouth daily.    . hydrochlorothiazide (HYDRODIURIL) 25 MG tablet TAKE 1 TABLET BY MOUTH EVERY DAY 90 tablet 2  . levothyroxine (SYNTHROID, LEVOTHROID) 200 MCG tablet Take 225 mcg by mouth daily before breakfast. Take with 36mcg tablet for a total of 225    . levothyroxine (SYNTHROID, LEVOTHROID) 200 MCG tablet TAKE 1 TABLET BY MOUTH ONCE DAILY 90  tablet 2  . levothyroxine (SYNTHROID, LEVOTHROID) 25 MCG tablet Take 225 mcg by mouth daily before breakfast. Take with 235mcg tablet to equal 236mcg    . levothyroxine (SYNTHROID, LEVOTHROID) 25 MCG tablet TAKE 1 TABLET BY MOUTH EVERY DAY ALONG WITH THE 200MCG STRENGTH 90 tablet 2  . lisinopril (PRINIVIL,ZESTRIL) 5 MG tablet Take 5 mg by mouth daily.    Marland Kitchen lisinopril (PRINIVIL,ZESTRIL) 5 MG tablet TAKE 1 TABLET BY MOUTH EVERY DAY 90 tablet 2  . loratadine (CLARITIN) 10 MG tablet Take 10 mg by mouth daily.    Marland Kitchen lovastatin (MEVACOR) 20 MG tablet Take 1 tablet (20 mg total) by mouth at bedtime. 90 tablet 3  . montelukast (SINGULAIR) 10 MG tablet     . naproxen (NAPROSYN) 500 MG tablet Take 1 tablet (500 mg total) by mouth 2 (two) times daily with a meal. 30 tablet 0  . omeprazole (PRILOSEC) 40 MG capsule Take 1 capsule (40 mg total) by mouth daily. 30 capsule 3  . sucralfate (CARAFATE) 1 GM/10ML suspension Take 10 mLs (1 g total) by mouth 3 (three) times daily before meals. 420 mL 0   No current facility-administered medications for this visit.    No Known Allergies  Family History  Problem Relation Age of Onset  . Hypertension Father   . Stroke Father  hemorrhagic, deceased  . Diabetes Mother   . Coronary artery disease Neg Hx   . Cancer Neg Hx     History   Social History  . Marital Status: Married    Spouse Name: N/A    Number of Children: 1  . Years of Education: N/A   Occupational History  . Not on file.   Social History Main Topics  . Smoking status: Never Smoker   . Smokeless tobacco: Never Used  . Alcohol Use: No  . Drug Use: No  . Sexual Activity:    Partners: Male    Birth Control/ Protection: Surgical     Comment: TUBAL LIGATION   Other Topics Concern  . Not on file   Social History Narrative   Caffeine: none   Lives with husband and 1 daughter (2012)   Occupation: stay at home mom, prior was daycare provider   Edu: 9th grade   Activity: walks daily     Constitutional: Positive nausea and fatigue. Denies vomiting, fever, malaise, headache or abrupt weight changes.  Respiratory: Denies difficulty breathing, shortness of breath, or cough Cardiovascular: Denies chest pain, chest tightness, palpitations or swelling in the hands or feet.  Gastrointestinal: Positive abdominal pain, bloating, constipation, and diarrhea. Denies blood in the stool.  GU: Denies urgency, frequency, pain with urination, burning sensation, blood in urine, odor or discharge. Musculoskeletal: Denies decrease in range of motion, difficulty with gait, muscle pain or joint pain and swelling.   No other specific complaints in a complete review of systems (except as listed in HPI above).  Objective:   Physical Exam  BP 138/96 mmHg  Pulse 78  Temp(Src) 98 F (36.7 C) (Oral)  Wt 164 lb (74.39 kg)  SpO2 98%  LMP 11/12/2011 Wt Readings from Last 3 Encounters:  09/21/14 164 lb (74.39 kg)  06/07/14 167 lb (75.751 kg)  04/06/14 151 lb (68.493 kg)    General: Appears her stated age, well developed, but looks uncomfortable holding her left side.   Skin: Warm, dry and intact. No rashes, lesions or ulcerations noted. Cardiovascular: Normal rate and rhythm. S1,S2 noted.  No murmur, rubs or gallops noted.  Pulmonary/Chest: Normal effort and positive vesicular breath sounds. No respiratory distress. No wheezes, rales or ronchi noted.  Abdomen: Soft and tender to light and deep palpation in left upper and lower quadrants. Positive guarding. Normoactive bowel sounds in all four quadrants. No bruits heard in abdominal aorta, renal arteries, or iliac arteries. No distention or masses noted. Liver, spleen and kidneys non palpable.   BMET    Component Value Date/Time   NA 143 06/07/2014 1546   K 4.2 06/07/2014 1546   CL 107 06/07/2014 1546   CO2 29 06/07/2014 1546   GLUCOSE 98 06/07/2014 1546   BUN 22 06/07/2014 1546   CREATININE 0.7 06/07/2014 1546   CREATININE 0.86  05/07/2012 1450   CREATININE 0.6 12/22/2010 0922   CALCIUM 9.1 06/07/2014 1546   GFRNONAA >90 04/10/2014 2207   GFRAA >90 04/10/2014 2207    Lipid Panel     Component Value Date/Time   CHOL 231* 11/12/2013 1254   TRIG 179.0* 11/12/2013 1254   HDL 40.50 11/12/2013 1254   CHOLHDL 6 11/12/2013 1254   VLDL 35.8 11/12/2013 1254   LDLCALC 155* 11/12/2013 1254    CBC    Component Value Date/Time   WBC 8.5 06/07/2014 1546   RBC 4.60 06/07/2014 1546   HGB 13.8 06/07/2014 1546   HCT 41.3 06/07/2014 1546  PLT 153.0 06/07/2014 1546   MCV 89.8 06/07/2014 1546   MCH 29.9 04/10/2014 2207   MCHC 33.5 06/07/2014 1546   RDW 13.6 06/07/2014 1546   LYMPHSABS 1.6 06/07/2014 1546   MONOABS 0.8 06/07/2014 1546   EOSABS 0.1 06/07/2014 1546   BASOSABS 0.1 06/07/2014 1546    Hgb A1C Lab Results  Component Value Date   HGBA1C 5.9 04/03/2011        Assessment & Plan:   LUQ & LLQ abdominal pain, alternating constipation and diarrhea:  Suggestive of IBS. GI referral.  Bloating:  Stay away from gas-producing and sodium rich foods. Okay to use Gas-X OTC.  RTC as needed or if symptoms persist or worsen

## 2014-09-27 ENCOUNTER — Other Ambulatory Visit (INDEPENDENT_AMBULATORY_CARE_PROVIDER_SITE_OTHER): Payer: 59

## 2014-09-27 ENCOUNTER — Ambulatory Visit (INDEPENDENT_AMBULATORY_CARE_PROVIDER_SITE_OTHER): Payer: 59 | Admitting: Physician Assistant

## 2014-09-27 ENCOUNTER — Encounter: Payer: Self-pay | Admitting: Physician Assistant

## 2014-09-27 VITALS — BP 128/82 | HR 78 | Ht 62.0 in | Wt 167.4 lb

## 2014-09-27 DIAGNOSIS — R112 Nausea with vomiting, unspecified: Secondary | ICD-10-CM

## 2014-09-27 DIAGNOSIS — R1032 Left lower quadrant pain: Secondary | ICD-10-CM

## 2014-09-27 DIAGNOSIS — R197 Diarrhea, unspecified: Secondary | ICD-10-CM

## 2014-09-27 DIAGNOSIS — R1012 Left upper quadrant pain: Secondary | ICD-10-CM

## 2014-09-27 LAB — BASIC METABOLIC PANEL
BUN: 20 mg/dL (ref 6–23)
CALCIUM: 9.7 mg/dL (ref 8.4–10.5)
CHLORIDE: 100 meq/L (ref 96–112)
CO2: 29 meq/L (ref 19–32)
Creatinine, Ser: 0.68 mg/dL (ref 0.40–1.20)
GFR: 45.76 mL/min — ABNORMAL LOW (ref >60.00)
Glucose, Bld: 114 mg/dL — ABNORMAL HIGH (ref 70–99)
Potassium: 3.9 mEq/L (ref 3.5–5.1)
Sodium: 136 mEq/L (ref 135–145)

## 2014-09-27 LAB — CBC WITH DIFFERENTIAL/PLATELET
Basophils Absolute: 0 10*3/uL (ref 0.0–0.1)
Basophils Relative: 0.5 % (ref 0.0–3.0)
EOS ABS: 0.2 10*3/uL (ref 0.0–0.7)
EOS PCT: 2.1 % (ref 0.0–5.0)
HCT: 43.8 % (ref 36.0–46.0)
Hemoglobin: 14.6 g/dL (ref 12.0–15.0)
Lymphocytes Relative: 22 % (ref 12.0–46.0)
Lymphs Abs: 1.8 10*3/uL (ref 0.7–4.0)
MCHC: 33.5 g/dL (ref 30.0–36.0)
MCV: 87.2 fl (ref 78.0–100.0)
MONO ABS: 0.6 10*3/uL (ref 0.1–1.0)
Monocytes Relative: 7.3 % (ref 3.0–12.0)
Neutro Abs: 5.5 10*3/uL (ref 1.4–7.7)
Neutrophils Relative %: 68.1 % (ref 43.0–77.0)
Platelets: 144 10*3/uL — ABNORMAL LOW (ref 150.0–400.0)
RBC: 5.02 Mil/uL (ref 3.87–5.11)
RDW: 13.2 % (ref 11.5–15.5)
WBC: 8.1 10*3/uL (ref 4.0–10.5)

## 2014-09-27 LAB — SEDIMENTATION RATE: SED RATE: 22 mm/h (ref 0–22)

## 2014-09-27 LAB — HIGH SENSITIVITY CRP: CRP, High Sensitivity: 5.09 mg/L — ABNORMAL HIGH (ref 0.000–5.000)

## 2014-09-27 MED ORDER — DICYCLOMINE HCL 10 MG PO CAPS: ORAL_CAPSULE | ORAL | Status: DC

## 2014-09-27 MED ORDER — MOVIPREP 100 G PO SOLR: 1.0000 | ORAL | Status: DC

## 2014-09-27 NOTE — Progress Notes (Signed)
Patient ID: [name], female   DOB: 1971-08-01, 44 y.o.   MRN: [id number]   Subjective:    Patient ID: [name], female    DOB: [birth year], 44 y.o.   MRN: [id number]  HPI [name] is a pleasant 44 year old Hispanic female new to GI referred by Dr. Danise [name] primary care. She has history of hypertension and hypothyroidism. Also with hyperlipidemia. She is status post appendectomy and hysterectomy. She comes in with complaints of persistent left-sided abdominal pain which is been present over the past 6-7 months. She says she feels that the pain is been gradually progressive. She states it is constant and at times stabbing in nature. Pain is exacerbated by eating usually within2 20 minutes after a meal. She has had some associated nausea and some sporadic episodes of vomiting which will make her feel better. She has not had any fever or chills. Occasionally some radiation around into her back. She has been having frequent diarrhea and urgency after meals. She says most days at least 5-6 loose to liquid bowel movements per day nonbloody. She says if she doesn't eat she'll have less bowel movements. She also has days of which she is calling constipation when she doesn't have a bowel movement at all but generally anything she passes has been very loose. Been fairly stable. It she says she feels generally fatigued. She has been on Prilosec and Carafate over the past several months with minimal benefit. No regular aspirin or NSAIDs, no EtOH. Family history negative for GI disease. Pelvic ultrasound was done on 04/09/2014 and this was negative she had CT scan of the abdomen and pelvis also done in August 2015 which was unremarkable.  Review of Systems Pertinent positive and negative review of systems were noted in the above HPI section.  All other review of systems was otherwise negative.  Outpatient Encounter Prescriptions as of 09/27/2014  Medication Sig  . amLODipine (NORVASC) 10 MG tablet Take 10 mg  by mouth daily.  . hydrochlorothiazide (HYDRODIURIL) 25 MG tablet TAKE 1 TABLET BY MOUTH EVERY DAY  . levothyroxine (SYNTHROID, LEVOTHROID) 200 MCG tablet TAKE 1 TABLET BY MOUTH ONCE DAILY  . levothyroxine (SYNTHROID, LEVOTHROID) 25 MCG tablet TAKE 1 TABLET BY MOUTH EVERY DAY ALONG WITH THE 200MCG STRENGTH  . lisinopril (PRINIVIL,ZESTRIL) 5 MG tablet TAKE 1 TABLET BY MOUTH EVERY DAY  . lovastatin (MEVACOR) 20 MG tablet Take 1 tablet (20 mg total) by mouth at bedtime.  . montelukast (SINGULAIR) 10 MG tablet   . omeprazole (PRILOSEC) 40 MG capsule Take 1 capsule (40 mg total) by mouth daily.  . sucralfate (CARAFATE) 1 GM/10ML suspension Take 10 mLs (1 g total) by mouth 3 (three) times daily before meals.  . dicyclomine (BENTYL) 10 MG capsule Take 1 tab 1 hours before meals and at bedtime.  Marland Kitchen MOVIPREP 100 G SOLR Take 1 kit (200 g total) by mouth as directed.  . [DISCONTINUED] hydrochlorothiazide (HYDRODIURIL) 25 MG tablet Take 25 mg by mouth daily.  . [DISCONTINUED] levothyroxine (SYNTHROID, LEVOTHROID) 200 MCG tablet Take 225 mcg by mouth daily before breakfast. Take with 76mg tablet for a total of 225  . [DISCONTINUED] levothyroxine (SYNTHROID, LEVOTHROID) 25 MCG tablet Take 225 mcg by mouth daily before breakfast. Take with 2067m tablet to equal 22538m . [DISCONTINUED] lisinopril (PRINIVIL,ZESTRIL) 5 MG tablet Take 5 mg by mouth daily.  . [DISCONTINUED] loratadine (CLARITIN) 10 MG tablet Take 10 mg by mouth daily.  . [DISCONTINUED] naproxen (NAPROSYN) 500 MG tablet Take 1 tablet (500  mg total) by mouth 2 (two) times daily with a meal. (Patient not taking: Reported on 09/27/2014)   No Known Allergies Patient Active Problem List   Diagnosis Date Noted  . Upper abdominal pain 06/07/2014  . Pain of left scapula 11/12/2013  . Numbness and tingling in right hand 06/17/2012  . Female pelvic pain 05/07/2012  . Anemia 04/03/2011  . MIGRAINE HEADACHE 05/12/2007  . OCCLUSION, CEREBRAL ARTERY NOS  W/INFARCTION 12/12/2006  . Skin rash 12/11/2006  . Hypothyroidism 10/31/2006  . HLD (hyperlipidemia) 10/31/2006  . Overweight (BMI 25.0-29.9) 10/31/2006  . HYPERTENSION, BENIGN SYSTEMIC 10/31/2006  . ASTHMA, PERSISTENT 10/31/2006  . UMBILICAL HERNIA 42/59/5638  . MENSTRUAL CYCLE, IRREGULAR 10/31/2006   History   Social History  . Marital Status: Married    Spouse Name: N/A    Number of Children: 1  . Years of Education: N/A   Occupational History  . Not on file.   Social History Main Topics  . Smoking status: Never Smoker   . Smokeless tobacco: Never Used  . Alcohol Use: No  . Drug Use: No  . Sexual Activity:    Partners: Male    Birth Control/ Protection: Surgical     Comment: TUBAL LIGATION   Other Topics Concern  . Not on file   Social History Narrative   Caffeine: none   Lives with husband and 1 daughter (2012)   Occupation: stay at home mom, prior was daycare provider   Edu: 9th grade   Activity: walks daily    [name]'s family history includes Diabetes in her mother; Hypertension in her father; Stroke in her father. There is no history of Coronary artery disease or Cancer.      Objective:    Filed Vitals:   09/27/14 1005  BP: 128/82  Pulse: 78    Physical Exam  well-developed Hispanic female in no acute distress, accompanied by an interpreter blood pressure 128/82 pulse 78 height 5 foot 2 weight 167. HEENT;nontraumatic normocephalic EOMI PERRLA sclera anicteric, Supple; no JVD, Cardiovascular;regular rate and rhythm with S1-S2 no murmur or gallop or clear bilaterally, Abdomen; soft bowel sounds are present she is tender in the left upper quadrant left mid quadrant and mildly in the left lower quadrant there is no guarding or rebound no palpable mass or hepatosplenomegaly no costal margin tenderness. Low midline incisional scar Rectal; exam not done, Extremities ;no clubbing cyanosis or edema skin warm an dry, Psych; mood and affect appropriate         Assessment & Plan:   #1 44 yo female with 6-7 month hx of left sided abdominal pain which is now constant. Pain is associated with urgency and diarrhea as well as intermittent nausea  Etiology is not clear, rule out underlying IBD or other inflammatory process or underlying malignancy though negative pelvic ultrasound and CT of the abdomen August 2015 #2 hypertension #3 hyperlipidemia #4 hypothyroidism  Plan; patient will continue Prilosec 40 mg by mouth every morning Add trial of Bentyl 10 mg one hour before meals Schedule for colonoscopy and EGD with Dr. Henrene Pastor. Procedures discussed in detail with the patient via the interpreter and she is agreeable to proceed. She will need an interpreter present for the day of her procedures. If EGD and colonoscopy are unrevealing will need repeat imaging of her abdomen and further evaluation of the small bowel. Check CBC with differential, Bmet , sedimentation rate and CRP   Laya Letendre S Solash Tullo PA-C 09/27/2014

## 2014-09-27 NOTE — Patient Instructions (Signed)
Please go to the basement level to have your labs drawn.  Continue Prilosec 40 mg, 1 tablet daily. We sent a prescription for Bentyl 10 mg.  ,You have been scheduled for a colonoscopy. Please follow written instructions given to you at your visit today.  Please pick up your prep kit at the pharmacy within the next 1-3 days. If you use inhalers (even only as needed), please bring them with you on the day of your procedure.

## 2014-09-27 NOTE — Progress Notes (Signed)
Agree with initial assessment and plans 

## 2014-09-30 ENCOUNTER — Other Ambulatory Visit: Payer: Self-pay

## 2014-10-04 HISTORY — PX: COLONOSCOPY: SHX174

## 2014-10-04 HISTORY — PX: ESOPHAGOGASTRODUODENOSCOPY: SHX1529

## 2014-10-11 ENCOUNTER — Other Ambulatory Visit: Payer: Self-pay

## 2014-10-11 MED ORDER — MOVIPREP 100 G PO SOLR: 1.0000 | Freq: Once | ORAL | Status: DC

## 2014-10-15 ENCOUNTER — Ambulatory Visit (AMBULATORY_SURGERY_CENTER): Payer: 59 | Admitting: Internal Medicine

## 2014-10-15 ENCOUNTER — Encounter: Payer: Self-pay | Admitting: Internal Medicine

## 2014-10-15 ENCOUNTER — Other Ambulatory Visit: Payer: Self-pay

## 2014-10-15 VITALS — BP 132/86 | HR 82 | Temp 97.2°F | Resp 12 | Ht 62.0 in | Wt 167.0 lb

## 2014-10-15 DIAGNOSIS — D125 Benign neoplasm of sigmoid colon: Secondary | ICD-10-CM

## 2014-10-15 DIAGNOSIS — R1032 Left lower quadrant pain: Secondary | ICD-10-CM

## 2014-10-15 DIAGNOSIS — R112 Nausea with vomiting, unspecified: Secondary | ICD-10-CM

## 2014-10-15 MED ORDER — SODIUM CHLORIDE 0.9 % IV SOLN: 500.0000 mL | INTRAVENOUS | Status: DC

## 2014-10-15 NOTE — Progress Notes (Signed)
Called to room to assist during endoscopic procedure.  Patient ID and intended procedure confirmed with present staff. Received instructions for my participation in the procedure from the performing physician.  

## 2014-10-15 NOTE — Op Note (Signed)
[place]  [address]. [place], [address]   COLONOSCOPY PROCEDURE REPORT  PATIENT: [name], [name]  MR#: [id number] BIRTHDATE: Aug 29, 1971 , 43  yrs. old GENDER: female ENDOSCOPIST: Eustace Quail, MD REFERRED PP:[id number] Gutierrez, M.D. PROCEDURE DATE:  10/15/2014 PROCEDURE:   Colonoscopy with snare polypectomy x 1 First Screening Colonoscopy - Avg.  risk and is 50 yrs.  old or older - No.  Prior Negative Screening - Now for repeat screening. N/A  History of Adenoma - Now for follow-up colonoscopy & has been > or = to 3 yrs.  N/A  Polyps Removed Today? Yes. ASA CLASS:   Class II INDICATIONS:abdominal pain in the lower left quadrant. Seen in office by physician assistant 2 weeks ago MEDICATIONS: Monitored anesthesia care and Propofol 200 mg IV  DESCRIPTION OF PROCEDURE:   After the risks benefits and alternatives of the procedure were thoroughly explained, informed consent was obtained.  The digital rectal exam revealed no abnormalities of the rectum.   The LB [id number]  endoscope was introduced through the anus and advanced to the cecum, which was identified by both the appendix and ileocecal valve. No adverse events experienced.   The quality of the prep was excellent, using MoviPrep  The instrument was then slowly withdrawn as the colon was fully examined.   COLON FINDINGS: The examined terminal ileum appeared to be normal. A single polyp measuring 2 mm in size was found in the sigmoid colon.  A polypectomy was performed with a cold snare.  The resection was complete, the polyp tissue was completely retrieved and sent to histology.   The examination was otherwise normal. Retroflexed views revealed internal hemorrhoids. The time to cecum=2 minutes 01 seconds.  Withdrawal time=8 minutes 19 seconds. The scope was withdrawn and the procedure completed. COMPLICATIONS: There were no immediate complications.  ENDOSCOPIC IMPRESSION: 1.   The examined  terminal ileum was normal 2.   Single polyp measuring 2 mm in size was found in the sigmoid colon; polypectomy was performed with a cold snare 3.   The examination was otherwise normal 4.   No explanation for chronic abdominal pain  RECOMMENDATIONS: 1.  Repeat colonoscopy in 5 years if polyp adenomatous; otherwise 10 years 2.  Upper endoscopy performed today. Please see report as well as accompanying recommendations  eSigned:  Eustace Quail, MD 10/15/2014 2:56 PM   cc: Ria Bush MD and The Patient

## 2014-10-15 NOTE — Progress Notes (Signed)
Report to PACU, RN, vss, BBS= Clear.  

## 2014-10-15 NOTE — Progress Notes (Signed)
Scheduled for CT on 10/21/14 at 10:30am for [name] per Dr. Henrene Pastor.

## 2014-10-15 NOTE — Patient Instructions (Signed)
YOU HAD AN ENDOSCOPIC PROCEDURE TODAY AT THE [facility]: Refer to the procedure report that was given to you for any specific questions about what was found during the examination.  If the procedure report does not answer your questions, please call your gastroenterologist to clarify.  If you requested that your care partner not be given the details of your procedure findings, then the procedure report has been included in a sealed envelope for you to review at your convenience later.  YOU SHOULD EXPECT: Some feelings of bloating in the abdomen. Passage of more gas than usual.  Walking can help get rid of the air that was put into your GI tract during the procedure and reduce the bloating. If you had a lower endoscopy (such as a colonoscopy or flexible sigmoidoscopy) you may notice spotting of blood in your stool or on the toilet paper. If you underwent a bowel prep for your procedure, then you may not have a normal bowel movement for a few days.  DIET: Your first meal following the procedure should be a light meal and then it is ok to progress to your normal diet.  A half-sandwich or bowl of soup is an example of a good first meal.  Heavy or fried foods are harder to digest and may make you feel nauseous or bloated.  Likewise meals heavy in dairy and vegetables can cause extra gas to form and this can also increase the bloating.  Drink plenty of fluids but you should avoid alcoholic beverages for 24 hours.  ACTIVITY: Your care partner should take you home directly after the procedure.  You should plan to take it easy, moving slowly for the rest of the day.  You can resume normal activity the day after the procedure however you should NOT DRIVE or use heavy machinery for 24 hours (because of the sedation medicines used during the test).    SYMPTOMS TO REPORT IMMEDIATELY: A gastroenterologist can be reached at any hour.  During normal business hours, 8:30 AM to 5:00 PM Monday through Friday,  call [phone].  After hours and on weekends, please call the GI answering service at [phone] who will take a message and have the physician on call contact you.   Following lower endoscopy (colonoscopy or flexible sigmoidoscopy):  Excessive amounts of blood in the stool  Significant tenderness or worsening of abdominal pains  Swelling of the abdomen that is new, acute  Fever of 100F or higher  Following upper endoscopy (EGD)  Vomiting of blood or coffee ground material  New chest pain or pain under the shoulder blades  Painful or persistently difficult swallowing  New shortness of breath  Fever of 100F or higher  Black, tarry-looking stools  FOLLOW UP: If any biopsies were taken you will be contacted by phone or by letter within the next 1-3 weeks.  Call your gastroenterologist if you have not heard about the biopsies in 3 weeks.  Our staff will call the home number listed on your records the next business day following your procedure to check on you and address any questions or concerns that you may have at that time regarding the information given to you following your procedure. This is a courtesy call and so if there is no answer at the home number and we have not heard from you through the emergency physician on call, we will assume that you have returned to your regular daily activities without incident.  SIGNATURES/CONFIDENTIALITY: You and/or your care   partner have signed paperwork which will be entered into your electronic medical record.  These signatures attest to the fact that that the information above on your After Visit Summary has been reviewed and is understood.  Full responsibility of the confidentiality of this discharge information lies with you and/or your care-partner.  Recommendations Next colonoscopy determined by pathology results; 5 or 10 years. Discharge instructions given to patient and/or care partner. Polyp handout provided. Instructions,  contrast, and directions given to patient, interpreter, and/or care partner.

## 2014-10-15 NOTE — Op Note (Signed)
[facility]  [address] [place], [address]   ENDOSCOPY PROCEDURE REPORT  [name]: [name], [name]  MR#: [id number] BIRTHDATE: 1970-10-13 , 43  yrs. old GENDER: female ENDOSCOPIST: Laura Quail, MD REFERRED BY:  [name], M.D. PROCEDURE DATE:  10/15/2014 PROCEDURE:  EGD, diagnostic ASA CLASS:     Class II INDICATIONS:  abdominal pain in Laura lower left quadrant. MEDICATIONS: Monitored anesthesia care and Propofol 200 mg IV TOPICAL ANESTHETIC: none  DESCRIPTION OF PROCEDURE: After Laura risks benefits and alternatives of Laura procedure were thoroughly explained, informed consent was obtained.  Laura LB [id number] endoscope was introduced through Laura mouth and advanced to Laura second portion of Laura duodenum , Without limitations.  Laura instrument was slowly withdrawn as Laura mucosa was fully examined.    EXAM: Laura esophagus and gastroesophageal junction were completely normal in appearance.  Laura stomach was entered and closely examined.Laura antrum, angularis, and lesser curvature were well visualized, including a retroflexed view of Laura cardia and fundus. Laura stomach wall was normally distensable.  Laura scope passed easily through Laura pylorus into Laura duodenum.  Retroflexed views revealed no abnormalities.     Laura scope was then withdrawn from [name] and Laura procedure completed.  COMPLICATIONS: There were no immediate complications.  ENDOSCOPIC IMPRESSION: 1. Normal EGD. 2. No explanation for pain found  RECOMMENDATIONS: 1.  Continue current medications 2.  My office will schedule you for a contrast-enhanced CT scan of Laura abdomen and pelvis "persistent left-sided abdominal pain" . We will contact you with Laura results thereafter as well as impressions and recommendations  REPEAT EXAM:  eSigned:  Eustace Quail, MD 10/15/2014 3:00 PM    [id number], Laura Hatchet MD and [name]

## 2014-10-18 ENCOUNTER — Other Ambulatory Visit: Payer: Self-pay

## 2014-10-18 ENCOUNTER — Telehealth: Payer: Self-pay | Admitting: *Deleted

## 2014-10-18 DIAGNOSIS — R109 Unspecified abdominal pain: Secondary | ICD-10-CM

## 2014-10-18 NOTE — Telephone Encounter (Signed)
Message left

## 2014-10-20 ENCOUNTER — Encounter: Payer: Self-pay | Admitting: Internal Medicine

## 2014-10-20 ENCOUNTER — Encounter: Payer: Self-pay | Admitting: Family Medicine

## 2014-10-21 ENCOUNTER — Ambulatory Visit (INDEPENDENT_AMBULATORY_CARE_PROVIDER_SITE_OTHER)
Admission: RE | Admit: 2014-10-21 | Discharge: 2014-10-21 | Disposition: A | Discharge status: Home / Self Care | Payer: 59 | Source: Ambulatory Visit | Attending: Internal Medicine | Admitting: Internal Medicine

## 2014-10-21 DIAGNOSIS — R1032 Left lower quadrant pain: Secondary | ICD-10-CM

## 2014-10-21 MED ORDER — IOHEXOL 300 MG/ML  SOLN: 100.0000 mL | Freq: Once | INTRAMUSCULAR | Status: AC | PRN

## 2014-10-27 ENCOUNTER — Encounter (HOSPITAL_COMMUNITY): Payer: Self-pay | Admitting: Emergency Medicine

## 2014-10-27 ENCOUNTER — Emergency Department (HOSPITAL_COMMUNITY)
Admission: EM | Admit: 2014-10-27 | Discharge: 2014-10-27 | Disposition: A | Discharge status: Home / Self Care | Payer: 59 | Source: Home / Self Care | Attending: Family Medicine | Admitting: Family Medicine

## 2014-10-27 DIAGNOSIS — J111 Influenza due to unidentified influenza virus with other respiratory manifestations: Secondary | ICD-10-CM

## 2014-10-27 LAB — POCT RAPID STREP A: STREPTOCOCCUS, GROUP A SCREEN (DIRECT): NEGATIVE

## 2014-10-27 MED ORDER — OSELTAMIVIR PHOSPHATE 75 MG PO CAPS: 75.0000 mg | ORAL_CAPSULE | Freq: Two times a day (BID) | ORAL | Status: DC

## 2014-10-27 NOTE — Discharge Instructions (Signed)
Gripe (Influenza) Take Theraflu or Nyquil for symptoms Ibuprofen 600 mg every 6 hours Lots of fluids Rest La gripe es una infeccin viral del tracto respiratorio. Ocurre con ms frecuencia en los meses de invierno, ya que las personas pasan ms tiempo en contacto cercano. La gripe puede enfermarlo considerablemente. Se transmite fcilmente de [place] persona a otra (es contagiosa). CAUSAS  La causa es un virus que infecta el tracto respiratorio. Puede contagiarse el virus al aspirar las gotitas que una persona infectada elimina al toser o Brewing technologist. Tambin puede contagiarse al tocar algo que fue recientemente contaminado con el virus y [employer] mano a la boca, la nariz o los ojos. RIESGOS Y COMPLICACIONES Tendr mayor riesgo de sufrir un resfro grave si consume cigarrillos, es diabtico, sufre una enfermedad cardaca (como insuficiencia cardaca) o pulmonar crnica (como asma) o si tiene debilitado el sistema inmunolgico. Los ancianos y las mujeres embarazadas tienen ms riesgo de sufrir infecciones graves. El problema ms frecuente de la gripe es la infeccin pulmonar (neumona). En algunos casos, este problema puede requerir atencin mdica de emergencia y Ship broker en peligro la vida. [place]  Los sntomas pueden durar entre 4 y 83 das y pueden ser:  Cristy Hilts.  Escalofros.  Dolor de [place], dolores en el cuerpo y musculares.  Dolor de Investment banker, operational.  Molestias en el pecho y tos.  Prdida del apetito.  Debilidad o cansancio.  Mareos.  Nuseas o vmitos. DIAGNSTICO  El diagnstico se realiza segn su historia clnica y un examen fsico. Es necesario realizar un anlisis de cultivo farngeo o nasal para confirmar el diagnstico. TRATAMIENTO  En los casos leves, la gripe se cura sin Clinical research associate. El tratamiento est dirigido a Herbalist sntomas. En los casos ms graves, el mdico podr recetar medicamentos antivirales para acortar el curso de la enfermedad. Los  antibiticos no son eficaces, ya que la infeccin est causada por un virus y no una bacteria. [place] los medicamentos solamente como se lo haya indicado el mdico.  Utilice un humidificador de niebla fra para facilitar la respiracin.  Haga reposo hasta que la temperatura vuelva a ser normal. Generalmente esto lleva entre 3 y 4 das.  Beba suficiente lquido para Consulting civil engineer orina clara o de color amarillo plido.  Cbrase la boca y la nariz al toser o Brewing technologist, y [employer] manos muy bien para evitar que se propague el virus.  Foy [place] en su casa y no concurra al [place] o a la escuela hasta que la fiebre haya desaparecido al menos por un da completo. PREVENCIN  La vacunacin anual contra la gripe es la mejor manera de evitar enfermarse. Se recomienda ahora de manera rutinaria una vacuna anual contra la gripe a todos los [employer]. SOLICITE ATENCIN MDICA SI:  Tiene dolor en el pecho, la tos empeora o tiene ms mucosidad.  Tiene nuseas, vmitos o diarrea.  La fiebre regresa o empeora. SOLICITE ATENCIN MDICA DE INMEDIATO SI:   Tiene dificultad para respirar, le falta el [place] uas [place].  Presenta dolor intenso o entumecimiento en el cuello.  Le duele la cabeza de forma repentina o tiene dolor en la cara o el odo.  Tiene nuseas o vmitos que no puede controlar. ASEGRESE DE QUE:   Comprende estas instrucciones.  Controlar su afeccin.  Recibir ayuda de inmediato si no mejora o si empeora. Document Released: 05/30/2005 Document Revised: 01/04/2014 ExitCare Patient Information 2015 [place]. This information is not intended to replace advice given to you by your health care provider. Make sure you discuss any questions you have with your health care provider.

## 2014-10-27 NOTE — ED Notes (Signed)
C/o cold sx onset yest am Sx include ST, HA, productive cough, BA, chills, fevers, right ear pain Taking tyle w/no relief Alert, no signs of acute distress.

## 2014-10-27 NOTE — ED Provider Notes (Signed)
CSN: [id number]     Arrival date & time 10/27/14  1402 History   First MD Initiated Contact with Patient 10/27/14 1541     Chief Complaint  Patient presents with  . URI   (Consider location/radiation/quality/duration/timing/severity/associated sxs/prior Treatment) HPI Comments: Patient complains of flulike symptoms starting yesterday morning.  Patient is a 44 y.o. female presenting with URI.  URI Presenting symptoms: fever, rhinorrhea and sore throat   Presenting symptoms: no congestion and no fatigue   Associated symptoms: myalgias   Associated symptoms: no neck pain and no wheezing     Past Medical History  Diagnosis Date  . Hyperthyroidism 10/2000    RAI treatment,  multinodular goiter  . Ectopic pregnancy 04/2006    treated with medication  . Hepatic steatosis 07/2006, 04/2014    on Abd CT done for pain  . 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 Preterm delivery 12/2010    x3, placental abruption, maternal htn, DM  . Hypertension   . Anemia   . ABORTION, SPONTANEOUS 08/18/2007  . IMPAIRED GLUCOSE TOLERANCE 08/18/2007  . Acquired hypothyroidism   . Asthma   . Ovarian cyst 04/2014   Past Surgical History  Procedure Laterality Date  . Appendectomy  1997  . Cholecystectomy  1997  . Ovarian cyst removal    . Laparoscopic lysis intestinal adhesions    . Tubal ligation    . Thyroid surgery      goiter removed  . Cesarean section      x 1  . Laparoscopic hysterectomy  04/24/2012    Procedure: HYSTERECTOMY TOTAL LAPAROSCOPIC;  Surgeon: Terrance Mass, MD;  Location: [facility];  Service: Gynecology;  Laterality: N/A;  2 1/2 hours OR time.  Dr. Phineas Real to assisting   . Abdominal hysterectomy  04/24/2012    Procedure: HYSTERECTOMY ABDOMINAL;  Surgeon: Terrance Mass, MD;  Location: [facility];  Service: Gynecology;;  . Esophagogastroduodenoscopy  10/2014    WNL  . Colonoscopy  10/2014    1 hyperplastic polyp, rpt 10 yrs Henrene Pastor)   Family History   Problem Relation Age of Onset  . Hypertension Father   . Stroke Father     hemorrhagic, deceased  . Diabetes Mother   . Uterine cancer Mother   . Coronary artery disease Neg Hx   . Cancer Neg Hx    History  Substance Use Topics  . Smoking status: Never Smoker   . Smokeless tobacco: Never Used  . Alcohol Use: No   OB History    Gravida Para Term Preterm AB TAB SAB Ectopic Multiple Living   5 1  1 3  3   1      Review of Systems  Constitutional: Positive for fever, chills, activity change and appetite change. Negative for fatigue.  HENT: Positive for rhinorrhea and sore throat. Negative for congestion, facial swelling and postnasal drip.   Eyes: Negative.   Respiratory: Negative.  Negative for shortness of breath and wheezing.        Occasional dry cough.  Cardiovascular: Negative.   Gastrointestinal: Negative.   Genitourinary: Negative.   Musculoskeletal: Positive for myalgias. Negative for neck pain and neck stiffness.  Skin: Negative for pallor and rash.  Neurological: Negative.     Allergies  Review of patient's allergies indicates no known allergies.  Home Medications   Prior to Admission medications   Medication Sig Start Date End Date Taking? Authorizing Provider  amLODipine (NORVASC) 10 MG tablet Take  10 mg by mouth daily.   Yes Historical Provider, MD  hydrochlorothiazide (HYDRODIURIL) 25 MG tablet TAKE 1 TABLET BY MOUTH EVERY DAY 08/12/14  Yes Ria Bush, MD  levothyroxine (SYNTHROID, LEVOTHROID) 200 MCG tablet TAKE 1 TABLET BY MOUTH ONCE DAILY 08/12/14  Yes Ria Bush, MD  lovastatin (MEVACOR) 20 MG tablet Take 1 tablet (20 mg total) by mouth at bedtime. 06/07/14  Yes Ria Bush, MD  montelukast (SINGULAIR) 10 MG tablet  07/10/14  Yes Historical Provider, MD  dicyclomine (BENTYL) 10 MG capsule Take 1 tab 1 hours before meals and at bedtime. 09/27/14   Amy S Esterwood, PA-C  lisinopril (PRINIVIL,ZESTRIL) 5 MG tablet TAKE 1 TABLET BY MOUTH EVERY  DAY 08/12/14   Ria Bush, MD  oseltamivir (TAMIFLU) 75 MG capsule Take 1 capsule (75 mg total) by mouth 2 (two) times daily. 10/27/14   Janne Napoleon, NP   BP 153/87 mmHg  Pulse 108  Temp(Src) 100 F (37.8 C) (Oral)  Resp 16  SpO2 98%  LMP 11/12/2011 Physical Exam  Constitutional: She is oriented to person, place, and time. She appears well-developed and well-nourished. No distress.  HENT:  Head: Normocephalic and atraumatic.  Mouth/Throat: No oropharyngeal exudate.  Right ear with mild retraction, left ear normal. Oropharynx with minor erythema otherwise clear.  Eyes: Conjunctivae and EOM are normal.  Neck: Normal range of motion. Neck supple.  Cardiovascular: Regular rhythm and normal heart sounds.   Pulmonary/Chest: Effort normal and breath sounds normal. No respiratory distress. She has no wheezes. She has no rales.  Abdominal: Soft. There is no tenderness.  Musculoskeletal: Normal range of motion. She exhibits no edema.  Lymphadenopathy:    She has no cervical adenopathy.  Neurological: She is alert and oriented to person, place, and time. She exhibits normal muscle tone.  Skin: Skin is warm and dry. No rash noted.  Psychiatric: She has a normal mood and affect.  Nursing note and vitals reviewed.   ED Course  Procedures (including critical care time) Labs Review Labs Reviewed - No data to display  Imaging Review No results found.   MDM   1. Flu    Tamiflu, lots of fluids, NyQuil or TheraFlu for symptoms, ibuprofen 600 mg every 6 hours when necessary, rest    Janne Napoleon, NP 10/27/14 1554

## 2014-10-29 LAB — CULTURE, GROUP A STREP: Strep A Culture: NEGATIVE

## 2014-11-23 LAB — POCT URINALYSIS DIPSTICK
BILIRUBIN UA: NEGATIVE
Glucose, UA: NEGATIVE
KETONES UA: NEGATIVE
Leukocytes, UA: NEGATIVE
Nitrite, UA: NEGATIVE
PH UA: 7
PROTEIN UA: NEGATIVE
RBC UA: NEGATIVE
SPEC GRAV UA: 1.02
Urobilinogen, UA: NEGATIVE

## 2014-11-23 NOTE — Addendum Note (Signed)
Addended by: Lurlean Nanny on: 11/23/2014 02:07 PM   Modules accepted: Orders

## 2014-12-07 ENCOUNTER — Other Ambulatory Visit: Payer: Self-pay | Admitting: Family Medicine

## 2014-12-10 ENCOUNTER — Other Ambulatory Visit: Payer: Self-pay | Admitting: *Deleted

## 2014-12-10 MED ORDER — AMLODIPINE BESYLATE 10 MG PO TABS: 10.0000 mg | ORAL_TABLET | Freq: Every day | ORAL | Status: DC

## 2015-02-06 ENCOUNTER — Emergency Department (HOSPITAL_COMMUNITY): Payer: 59

## 2015-02-06 ENCOUNTER — Emergency Department (HOSPITAL_COMMUNITY)
Admission: EM | Admit: 2015-02-06 | Discharge: 2015-02-06 | Disposition: A | Discharge status: Home / Self Care | Payer: 59 | Attending: Emergency Medicine | Admitting: Emergency Medicine

## 2015-02-06 ENCOUNTER — Encounter (HOSPITAL_COMMUNITY): Payer: Self-pay | Admitting: *Deleted

## 2015-02-06 DIAGNOSIS — I10 Essential (primary) hypertension: Secondary | ICD-10-CM | POA: Insufficient documentation

## 2015-02-06 DIAGNOSIS — Z8673 Personal history of transient ischemic attack (TIA), and cerebral infarction without residual deficits: Secondary | ICD-10-CM | POA: Diagnosis not present

## 2015-02-06 DIAGNOSIS — Z8742 Personal history of other diseases of the female genital tract: Secondary | ICD-10-CM | POA: Diagnosis not present

## 2015-02-06 DIAGNOSIS — Z79899 Other long term (current) drug therapy: Secondary | ICD-10-CM | POA: Insufficient documentation

## 2015-02-06 DIAGNOSIS — Z8719 Personal history of other diseases of the digestive system: Secondary | ICD-10-CM | POA: Diagnosis not present

## 2015-02-06 DIAGNOSIS — R1084 Generalized abdominal pain: Secondary | ICD-10-CM | POA: Insufficient documentation

## 2015-02-06 DIAGNOSIS — R112 Nausea with vomiting, unspecified: Secondary | ICD-10-CM | POA: Diagnosis not present

## 2015-02-06 DIAGNOSIS — Z9089 Acquired absence of other organs: Secondary | ICD-10-CM | POA: Insufficient documentation

## 2015-02-06 DIAGNOSIS — Z90710 Acquired absence of both cervix and uterus: Secondary | ICD-10-CM | POA: Diagnosis not present

## 2015-02-06 DIAGNOSIS — Z9851 Tubal ligation status: Secondary | ICD-10-CM | POA: Diagnosis not present

## 2015-02-06 DIAGNOSIS — E039 Hypothyroidism, unspecified: Secondary | ICD-10-CM | POA: Diagnosis not present

## 2015-02-06 DIAGNOSIS — J45909 Unspecified asthma, uncomplicated: Secondary | ICD-10-CM | POA: Insufficient documentation

## 2015-02-06 DIAGNOSIS — Z862 Personal history of diseases of the blood and blood-forming organs and certain disorders involving the immune mechanism: Secondary | ICD-10-CM | POA: Insufficient documentation

## 2015-02-06 DIAGNOSIS — R1010 Upper abdominal pain, unspecified: Secondary | ICD-10-CM

## 2015-02-06 DIAGNOSIS — E0590 Thyrotoxicosis, unspecified without thyrotoxic crisis or storm: Secondary | ICD-10-CM | POA: Diagnosis not present

## 2015-02-06 LAB — LIPASE, BLOOD: Lipase: 30 U/L (ref 22–51)

## 2015-02-06 LAB — COMPREHENSIVE METABOLIC PANEL
ALT: 63 U/L — ABNORMAL HIGH (ref 14–54)
ANION GAP: 9 (ref 5–15)
AST: 35 U/L (ref 15–41)
Albumin: 3.7 g/dL (ref 3.5–5.0)
Alkaline Phosphatase: 79 U/L (ref 38–126)
BUN: 21 mg/dL — AB (ref 6–20)
CALCIUM: 8.9 mg/dL (ref 8.9–10.3)
CHLORIDE: 105 mmol/L (ref 101–111)
CO2: 24 mmol/L (ref 22–32)
Creatinine, Ser: 0.68 mg/dL (ref 0.44–1.00)
GFR calc Af Amer: >60 mL/min (ref >60)
Glucose, Bld: 130 mg/dL — ABNORMAL HIGH (ref 65–99)
POTASSIUM: 4.5 mmol/L (ref 3.5–5.1)
Sodium: 138 mmol/L (ref 135–145)
Total Bilirubin: 0.5 mg/dL (ref 0.3–1.2)
Total Protein: 7.6 g/dL (ref 6.5–8.1)

## 2015-02-06 LAB — CBC WITH DIFFERENTIAL/PLATELET
BASOS ABS: 0 10*3/uL (ref 0.0–0.1)
BASOS PCT: 0 % (ref 0–1)
Eosinophils Absolute: 0.1 10*3/uL (ref 0.0–0.7)
Eosinophils Relative: 1 % (ref 0–5)
HEMATOCRIT: 44.1 % (ref 36.0–46.0)
Hemoglobin: 14.8 g/dL (ref 12.0–15.0)
Lymphocytes Relative: 5 % — ABNORMAL LOW (ref 12–46)
Lymphs Abs: 0.7 10*3/uL (ref 0.7–4.0)
MCH: 29.8 pg (ref 26.0–34.0)
MCHC: 33.6 g/dL (ref 30.0–36.0)
MCV: 88.9 fL (ref 78.0–100.0)
MONOS PCT: 3 % (ref 3–12)
Monocytes Absolute: 0.4 10*3/uL (ref 0.1–1.0)
NEUTROS ABS: 11.6 10*3/uL — AB (ref 1.7–7.7)
Neutrophils Relative %: 91 % — ABNORMAL HIGH (ref 43–77)
PLATELETS: 139 10*3/uL — AB (ref 150–400)
RBC: 4.96 MIL/uL (ref 3.87–5.11)
RDW: 12.6 % (ref 11.5–15.5)
WBC: 12.8 10*3/uL — AB (ref 4.0–10.5)

## 2015-02-06 LAB — URINE MICROSCOPIC-ADD ON

## 2015-02-06 LAB — URINALYSIS, ROUTINE W REFLEX MICROSCOPIC
Bilirubin Urine: NEGATIVE
GLUCOSE, UA: NEGATIVE mg/dL
Hgb urine dipstick: NEGATIVE
Ketones, ur: NEGATIVE mg/dL
Leukocytes, UA: NEGATIVE
Nitrite: NEGATIVE
PROTEIN: 30 mg/dL — AB
SPECIFIC GRAVITY, URINE: 1.023 (ref 1.005–1.030)
Urobilinogen, UA: 1 mg/dL (ref 0.0–1.0)
pH: 7 (ref 5.0–8.0)

## 2015-02-06 MED ORDER — HYDROCODONE-ACETAMINOPHEN 5-325 MG PO TABS: 2.0000 | ORAL_TABLET | ORAL | Status: DC | PRN

## 2015-02-06 MED ORDER — IBUPROFEN 600 MG PO TABS: 600.0000 mg | ORAL_TABLET | Freq: Four times a day (QID) | ORAL | Status: DC | PRN

## 2015-02-06 MED ORDER — ONDANSETRON 4 MG PO TBDP
8.0000 mg | ORAL_TABLET | Freq: Once | ORAL | Status: AC
  Administered 2015-02-06: 8 mg via ORAL

## 2015-02-06 MED ORDER — ONDANSETRON 4 MG PO TBDP: ORAL_TABLET | ORAL | Status: DC

## 2015-02-06 MED ORDER — MORPHINE SULFATE 4 MG/ML IJ SOLN
4.0000 mg | Freq: Once | INTRAMUSCULAR | Status: AC
  Administered 2015-02-06: 4 mg via INTRAVENOUS
  Filled 2015-02-06: qty 1

## 2015-02-06 MED ORDER — SODIUM CHLORIDE 0.9 % IV BOLUS (SEPSIS)
1000.0000 mL | Freq: Once | INTRAVENOUS | Status: AC
  Administered 2015-02-06: 1000 mL via INTRAVENOUS

## 2015-02-06 MED ORDER — ONDANSETRON 4 MG PO TBDP
ORAL_TABLET | ORAL | Status: AC
  Filled 2015-02-06: qty 2

## 2015-02-06 MED ORDER — IOHEXOL 300 MG/ML  SOLN
100.0000 mL | Freq: Once | INTRAMUSCULAR | Status: AC | PRN
  Administered 2015-02-06: 100 mL via INTRAVENOUS

## 2015-02-06 NOTE — ED Notes (Signed)
Pt given water to drink. 

## 2015-02-06 NOTE — ED Notes (Signed)
Pt states that she has had abdominal pain, n/v since yesterday. Pt reports that she has notice blood in her vomited as well.

## 2015-02-06 NOTE — Discharge Instructions (Signed)
You were evaluated in the ED today for abdominal pain. There does not appear to be an emergent cause for your symptoms at this time. Your exam, labs and CT scan of your abdomen were very reassuring. It is important for you to follow-up with primary care for further evaluation and management of your symptoms. Please take your medications as prescribed. Return to ED for new or worsening symptoms.  Dolor abdominal en las mujeres (Abdominal Pain, Women) El dolor abdominal (en el estmago, la pelvis o el vientre) puede tener muchas causas. Es importante que le informe a su mdico:  La ubicacin del Social research officer, government.  Viene y se va, o persiste todo el tiempo?  Hay situaciones que English as a second language teacher (comer ciertos alimentos, la actividad fsica)?  Tiene otros sntomas asociados al dolor (fiebre, nuseas, vmitos, diarrea)? Todo es de gran ayuda cuando se trata de hallar la causa del dolor. CAUSAS  Estmago: Infecciones por virus o bacterias, o lcera.  Intestino: Apendicitis (apndice inflamado), ileitis regional (enfermedad de Crohn), colitis ulcerosa (colon inflamado), sndrome del colon irritable, diverticulitis (inflamacin de los divertculos del colon) o cncer de estmago oo intestino.  Enfermedades de la vescula biliar o clculos.  Enfermedades renales, clculos o infecciones en el rin.  Infeccin o cncer del pncreas.  Fibromialgia (trastorno doloroso)  Enfermedades de los rganos femeninos:  Uterus: tero: fibroma (tumor no canceroso) o infeccin  Trompas de [place]: infeccin o embarazo ectpico  En los ovarios, quistes o tumores.  Adherencias plvicas (tejido cicatrizal).  Endometriosis (el tejido que cubre el tero se desarrolla en la pelvis y los rganos plvicos).  Sndrome de Occupational psychologist (los rganos femeninos se llenan de sangre antes del periodo menstrual(  Dolor durante el periodo menstrual.  Dolor durante la ovulacin (al producir vulos).  Dolor al usar el  DIU (dispositivo intrauterino para el control de la natalidad)  Programmer, systems los rganos femeninos.  Dolor funcional (no est originado en una enfermedad, puede mejorar sin tratamiento).  Dolor de origen psicolgico  Depresin. DIAGNSTICO Su mdico decidir la gravedad del dolor a travs del examen fsico  Anlisis de sangre  Radiografas  Ecografas  TC (tomografa computada, tipo especial de radiografas).  IMR (resonancia magntica)  Cultivos, en el caso una infeccin  Colon por enema de bario (se inserta una sustancia de contraste en el intestino grueso para mejorar la observacin con rayos X.)  Colonoscopa (observacin del intestino con un tubo luminoso).  Laparoscopa (examen del interior del abdomen con un tubo que tiene [employer]).  Ciruga exploratoria abdominal mayor (se observa el abdomen realizando una gran incisin). TRATAMIENTO El tratamiento depender de la causa del problema.   Muchos de estos casos pueden controlarse y tratarse en casa.  Medicamentos de venta libre indicados por el mdico.  Medicamentos con receta.  Antibiticos, en caso de infeccin  Pldoras anticonceptivas, en el caso de perodos dolorosos o dolor al ovular.  Tratamiento hormonal, para la endometriosis  Inyecciones para bloqueo nervioso selectivo.  Fisioterapia.  Antidepresivos.  Consejos por parte de un psclogo o psiquiatra.  Ciruga mayor o menor. INSTRUCCIONES PARA EL CUIDADO DOMICILIARIO  No tome ni administre laxantes a menos que se lo haya indicado su mdico.  Tome analgsicos de venta libre slo si se lo ha indicado el profesional que lo asiste. No tome aspirina, ya que puede causar [employer] o hemorragias.  Consuma una dieta lquida (caldo o agua) segn lo indicado por el mdico. Progrese lentamente a una dieta blanda, segn la [place],  si el dolor se relaciona con el estmago o el intestino.  Tenga un termmetro y tmese la temperatura varias  veces al da.  Haga reposo en la cama y [place], si esto Ship broker.  Evite las relaciones sexuales, Higher education careers adviser.  Evite las situaciones estresantes.  Cumpla con las visitas y los anlisis de control, segn las indicaciones de su mdico.  Si el dolor no se [place] con los medicamentos o la [place], [place] tratar con:  Acupuntura.  Ejercicios de relajacin (yoga, meditacin).  Terapia grupal.  Psicoterapia. SOLICITE ATENCIN MDICA SI:  Nota que ciertos Writer de Maish Vaya.  El tratamiento indicado para Lexicographer no Engineer, civil (consulting).  Necesita analgsicos ms fuertes.  Quiere que le retiren el DIU.  Si se siente confundido o desfalleciente.  Presenta nuseas o vmitos.  Aparece una erupcin cutnea.  Sufre efectos adversos o una reaccin alrgica debido a los medicamentos que toma. SOLICITE ATENCIN MDICA DE INMEDIATO SI:  El dolor persiste o se agrava.  Tiene fiebre.  Siente el dolor slo en algunos sectores del abdomen. Si se localiza en la zona derecha, posiblemente podra tratarse de apendicitis. En un adulto, si se localiza en la regin inferior izquierda del abdomen, podra tratarse de colitis o diverticulitis.  Hay sangre en las heces (deposiciones de color rojo brillante o negro alquitranado), con o sin vmitos.  Usted presenta sangre en la orina.  Siente escalofros con o sin fiebre.  Se desmaya. ASEGRESE QUE:   Comprende estas instrucciones.  Controlar su enfermedad.  Solicitar ayuda de inmediato si no mejora o si empeora. Document Released: 12/06/2008 Document Revised: 11/12/2011 [facility] Patient Information 2015 [place]. This information is not intended to replace advice given to you by your health care provider. Make sure you discuss any questions you have with your health care provider.

## 2015-02-06 NOTE — ED Provider Notes (Signed)
CSN: [id number]     Arrival date & time 02/06/15  1316 History   First MD Initiated Contact with Patient 02/06/15 1607     Chief Complaint  Patient presents with  . Abdominal Pain  . Emesis     (Consider location/radiation/quality/duration/timing/severity/associated sxs/prior Treatment) HPI [name] is a 44 y.o. female with a history of ectopic pregnancy, appendectomy, cholecystectomy, laparoscopic hysterectomy, comes in for evaluation of abdominal discomfort with nausea and vomiting. Patient is Spanish-speaking and history of present illness/translation is given by patient's husband. Patient reports last night approximately 10:00 PM, she was lying in bed when she began to feel a "intense grabbing pain" diffusely throughout her upper abdomen bilaterally. She reports associated nausea and vomiting with scant blood after multiple episodes of vomiting. She has not tried anything to improve her symptoms. Walking around will exacerbate the symptoms. Pain is rated as a 7/10. Reports intermittent sensations of feeling hot and cold, but denies any measured fevers. Denies associated chest pain, shortness of breath, urinary symptoms, changes in bowel habits, back pain, vaginal bleeding or discharge.  Past Medical History  Diagnosis Date  . Hyperthyroidism 10/2000    RAI treatment,  multinodular goiter  . Ectopic pregnancy 04/2006    treated with medication  . Hepatic steatosis 07/2006, 04/2014    on Abd CT done for pain  . 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 Preterm delivery 12/2010    x3, placental abruption, maternal htn, DM  . Hypertension   . Anemia   . ABORTION, SPONTANEOUS 08/18/2007  . IMPAIRED GLUCOSE TOLERANCE 08/18/2007  . Acquired hypothyroidism   . Asthma   . Ovarian cyst 04/2014   Past Surgical History  Procedure Laterality Date  . Appendectomy  1997  . Cholecystectomy  1997  . Ovarian cyst removal    . Laparoscopic lysis intestinal adhesions     . Tubal ligation    . Thyroid surgery      goiter removed  . Cesarean section      x 1  . Laparoscopic hysterectomy  04/24/2012    Procedure: HYSTERECTOMY TOTAL LAPAROSCOPIC;  Surgeon: Terrance Mass, MD;  Location: [facility];  Service: Gynecology;  Laterality: N/A;  2 1/2 hours OR time.  Dr. Phineas Real to assisting   . Abdominal hysterectomy  04/24/2012    Procedure: HYSTERECTOMY ABDOMINAL;  Surgeon: Terrance Mass, MD;  Location: [facility];  Service: Gynecology;;  . Esophagogastroduodenoscopy  10/2014    WNL  . Colonoscopy  10/2014    1 hyperplastic polyp, rpt 10 yrs Henrene Pastor)   Family History  Problem Relation Age of Onset  . Hypertension Father   . Stroke Father     hemorrhagic, deceased  . Diabetes Mother   . Uterine cancer Mother   . Coronary artery disease Neg Hx   . Cancer Neg Hx    History  Substance Use Topics  . Smoking status: Never Smoker   . Smokeless tobacco: Never Used  . Alcohol Use: No   OB History    Gravida Para Term Preterm AB TAB SAB Ectopic Multiple Living   5 1  1 3  3   1      Review of Systems A 10 point review of systems was completed and was negative except for pertinent positives and negatives as mentioned in the history of present illness     Allergies  Review of patient's allergies indicates no known allergies.  Home Medications   Prior  to Admission medications   Medication Sig Start Date End Date Taking? Authorizing Provider  amLODipine (NORVASC) 10 MG tablet Take 1 tablet (10 mg total) by mouth daily. 12/10/14  Yes Ria Bush, MD  hydrochlorothiazide (HYDRODIURIL) 25 MG tablet TAKE 1 TABLET BY MOUTH EVERY DAY 08/12/14  Yes Ria Bush, MD  levothyroxine (SYNTHROID, LEVOTHROID) 200 MCG tablet TAKE 1 TABLET BY MOUTH ONCE DAILY 08/12/14  Yes Ria Bush, MD  levothyroxine (SYNTHROID, LEVOTHROID) 25 MCG tablet Take 25 mcg by mouth daily before breakfast.   Yes Historical Provider, MD  lisinopril (PRINIVIL,ZESTRIL) 5 MG tablet  TAKE 1 TABLET BY MOUTH EVERY DAY 08/12/14  Yes Ria Bush, MD  lovastatin (MEVACOR) 20 MG tablet Take 1 tablet (20 mg total) by mouth at bedtime. 06/07/14  Yes Ria Bush, MD  montelukast (SINGULAIR) 10 MG tablet TAKE 1 TABLET BY MOUTH AT BEDTIME 12/08/14  Yes Ria Bush, MD  dicyclomine (BENTYL) 10 MG capsule Take 1 tab 1 hours before meals and at bedtime. 09/27/14   Amy S Esterwood, PA-C  HYDROcodone-acetaminophen (NORCO) 5-325 MG per tablet Take 2 tablets by mouth every 4 (four) hours as needed. 02/06/15   Comer Locket, PA-C  ibuprofen (ADVIL,MOTRIN) 600 MG tablet Take 1 tablet (600 mg total) by mouth every 6 (six) hours as needed. 02/06/15   Comer Locket, PA-C  ondansetron (ZOFRAN ODT) 4 MG disintegrating tablet 4mg  ODT q4 hours prn nausea/vomit 02/06/15   Comer Locket, PA-C  oseltamivir (TAMIFLU) 75 MG capsule Take 1 capsule (75 mg total) by mouth 2 (two) times daily. 10/27/14   Janne Napoleon, NP   BP 161/94 mmHg  Pulse 97  Temp(Src) 97.8 F (36.6 C) (Oral)  Resp 16  SpO2 100%  LMP 11/12/2011 Physical Exam  Constitutional: She is oriented to person, place, and time. She appears well-developed and well-nourished.  HENT:  Head: Normocephalic and atraumatic.  Mouth/Throat: Oropharynx is clear and moist.  Eyes: Conjunctivae are normal. Pupils are equal, round, and reactive to light. Right eye exhibits no discharge. Left eye exhibits no discharge. No scleral icterus.  Neck: Normal range of motion. Neck supple.  No meningeal signs or nuchal rigidity  Cardiovascular: Normal rate, regular rhythm and normal heart sounds.   Pulmonary/Chest: Effort normal and breath sounds normal. No respiratory distress. She has no wheezes. She has no rales.  Abdominal: Soft.  Diffuse tenderness to palpation in the upper abdomen bilaterally. Point tenderness in right lower quadrant. Abdomen is otherwise soft, nondistended. No other lesions are palpable masses or deformities.  Musculoskeletal:  Normal range of motion. She exhibits no tenderness.  Neurological: She is alert and oriented to person, place, and time.  Cranial Nerves II-XII grossly intact  Skin: Skin is warm and dry. No rash noted.  Psychiatric: She has a normal mood and affect.  Nursing note and vitals reviewed.   ED Course  Procedures (including critical care time) Labs Review Labs Reviewed  CBC WITH DIFFERENTIAL/PLATELET - Abnormal; Notable for the following:    WBC 12.8 (*)    Platelets 139 (*)    Neutrophils Relative % 91 (*)    Neutro Abs 11.6 (*)    Lymphocytes Relative 5 (*)    All other components within normal limits  COMPREHENSIVE METABOLIC PANEL - Abnormal; Notable for the following:    Glucose, Bld 130 (*)    BUN 21 (*)    ALT 63 (*)    All other components within normal limits  URINALYSIS, ROUTINE W REFLEX MICROSCOPIC (NOT AT [facility]) -  Abnormal; Notable for the following:    Protein, ur 30 (*)    All other components within normal limits  LIPASE, BLOOD  URINE MICROSCOPIC-ADD ON    Imaging Review Ct Abdomen Pelvis W Contrast  02/06/2015   CLINICAL DATA:  Abdominal pain with vomiting.  Initial encounter.  EXAM: CT ABDOMEN AND PELVIS WITH CONTRAST  TECHNIQUE: Multidetector CT imaging of the abdomen and pelvis was performed using the standard protocol following bolus administration of intravenous contrast.  CONTRAST:  181mL OMNIPAQUE IOHEXOL 300 MG/ML  SOLN  COMPARISON:  Abdominal pelvic CT 10/21/2014.  FINDINGS: Lower chest: Clear lung bases. No significant pleural or pericardial effusion.  Hepatobiliary: Mild hepatic steatosis likely. There is no focal hepatic abnormality. No significant biliary dilatation status post cholecystectomy.  Pancreas: Unremarkable. No pancreatic ductal dilatation or surrounding inflammatory changes.  Spleen: Normal in size without focal abnormality. Accessory splenule appears unchanged.  Adrenals/Urinary Tract: Both adrenal glands appear normal.The kidneys appear normal  without evidence of urinary tract calculus, suspicious lesion or hydronephrosis. No bladder abnormalities are seen.  Stomach/Bowel: No evidence of bowel wall thickening, distention or surrounding inflammatory change.The appendix is not visualized (previous appendectomy). Diverticular changes are present throughout the colon, greatest within the sigmoid.  Vascular/Lymphatic: There are no enlarged abdominal or pelvic lymph nodes. No significant vascular findings are present.  Reproductive: Previous hysterectomy. There is a low-density left adnexal mass measuring 6.2 x 5.0 x 6.0 cm. There is some ovarian tissue along the perimeter of this lesion. The right ovary appears normal.  Other: Postsurgical changes within the anterior pelvic wall. There is a focal soft tissue nodule measuring 1.6 cm on image number 78. This appears to be along the left aspect of the incision and could reflect a desmoid tumor or endometriosis implant.  Musculoskeletal: No acute or significant osseous findings. There is a stable bone island in the right supra-acetabular region. There are probable injection granulomas in both buttocks.  IMPRESSION: 1. No evidence of acute inflammation or bowel obstruction. 2. Moderate-size low-density left adnexal lesion status post partial hysterectomy. Given the size of this lesion, ultrasound correlation within 6-12 weeks recommended to further evaluate and/or document involution. 3. Diverticular changes throughout the colon without acute inflammation. 4. Soft tissue nodule within the left anterior pelvic wall along transverse incision, possibly desmoid tumor or endometriosis implant.   Electronically Signed   By: Richardean Sale M.D.   On: 02/06/2015 17:42     EKG Interpretation None     Meds given in ED:  Medications  ondansetron (ZOFRAN-ODT) disintegrating tablet 8 mg (8 mg Oral Given 02/06/15 1324)  sodium chloride 0.9 % bolus 1,000 mL (0 mLs Intravenous Stopped 02/06/15 1927)  morphine 4 MG/ML  injection 4 mg (4 mg Intravenous Given 02/06/15 1653)  iohexol (OMNIPAQUE) 300 MG/ML solution 100 mL (100 mLs Intravenous Contrast Given 02/06/15 1717)    Discharge Medication List as of 02/06/2015  7:05 PM    START taking these medications   Details  HYDROcodone-acetaminophen (NORCO) 5-325 MG per tablet Take 2 tablets by mouth every 4 (four) hours as needed., Starting 02/06/2015, Until Discontinued, Print    ibuprofen (ADVIL,MOTRIN) 600 MG tablet Take 1 tablet (600 mg total) by mouth every 6 (six) hours as needed., Starting 02/06/2015, Until Discontinued, Print    ondansetron (ZOFRAN ODT) 4 MG disintegrating tablet 4mg  ODT q4 hours prn nausea/vomit, Print       Filed Vitals:   02/06/15 1629 02/06/15 1645 02/06/15 1804 02/06/15 1928  BP: 151/94 147/89 167/104  161/94  Pulse: 101 103 105 97  Temp:      TempSrc:      Resp: 16 16 16 16   SpO2: 97% 98% 98% 100%    MDM  Vitals stable - WNL -afebrile Pt resting comfortably in ED. reports she feels much better after administration of analgesia in the ED. PE--abdomen tenderness diffusely with mild guarding. Leukocytosis 12.8. Obtain CT abdomen pelvis for further appreciation of abdominal pain. CT abdomen shows moderate sized low-density left adnexal lesion status post partial hysterectomy. Recommend ultrasound correlation within 6-12 weeks to ensure involution. Soft tissue nodule within the left anterior pelvic wall transverse incision possibly representing desmoid tumor or endometriosis implant.  DDX--patient with nonspecific abdominal pain and status post appendectomy, cholecystectomy, hysterectomy. Will DC with short course pain medicines, anti-inflammatory, instructions to follow-up with primary care for further evaluation and management of symptoms. No evidence of other acute or emergent pathology at this time.  I discussed all relevant lab findings and imaging results with pt and they verbalized understanding. Discussed f/u with PCP within 48  hrs and return precautions, pt very amenable to plan.  Final diagnoses:  Upper abdominal pain        Comer Locket, PA-C 02/07/15 0111  Daleen Bo, MD 02/07/15 1021

## 2015-08-08 ENCOUNTER — Ambulatory Visit (INDEPENDENT_AMBULATORY_CARE_PROVIDER_SITE_OTHER): Payer: 59 | Admitting: Obstetrics and Gynecology

## 2015-08-08 ENCOUNTER — Encounter: Payer: Self-pay | Admitting: Obstetrics and Gynecology

## 2015-08-08 VITALS — BP 184/118 | HR 61 | Temp 98.2°F | Ht 64.0 in | Wt 162.8 lb

## 2015-08-08 DIAGNOSIS — R1900 Intra-abdominal and pelvic swelling, mass and lump, unspecified site: Secondary | ICD-10-CM

## 2015-08-08 NOTE — Progress Notes (Signed)
Spanish Interpreter [name] Pt states that she has pain with intercourse, "lots of burning"; Mass if felt on left side of pelvic area  [place] scheduled for December 15th @ 1300.  Mammogram Scholarship filled out and faxed to the [place].

## 2015-08-08 NOTE — Progress Notes (Signed)
Patient ID: [name], female   DOB: March 24, 1971, 44 y.o.   MRN: [id number] 44 yo [id number] presenting today for the evaluation of LLQ pain. Patient reports that the pain has been present over the past 8 months. She states that the pain has become gradually worst over time. The pain is localized in the LLQ but may radiate to her lower left lower back as well. The pain is alleviated with the use of Motrin and aggravated by excessive movement. She states that intercourse has also become vary painful over time and she describes it as a burning sensation.  Past Medical History  Diagnosis Date  . Hyperthyroidism 10/2000    RAI treatment,  multinodular goiter  . Ectopic pregnancy 04/2006    treated with medication  . Hepatic steatosis 07/2006, 04/2014    on Abd CT done for pain  . 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 Preterm delivery 12/2010    x3, placental abruption, maternal htn, DM  . Hypertension   . Anemia   . ABORTION, SPONTANEOUS 08/18/2007  . IMPAIRED GLUCOSE TOLERANCE 08/18/2007  . Acquired hypothyroidism   . Asthma   . Ovarian cyst 04/2014   Past Surgical History  Procedure Laterality Date  . Appendectomy  1997  . Cholecystectomy  1997  . Ovarian cyst removal    . Laparoscopic lysis intestinal adhesions    . Tubal ligation    . Thyroid surgery      goiter removed  . Cesarean section      x 1  . Laparoscopic hysterectomy  04/24/2012    Procedure: HYSTERECTOMY TOTAL LAPAROSCOPIC;  Surgeon: Terrance Mass, MD;  Location: [facility];  Service: Gynecology;  Laterality: N/A;  2 1/2 hours OR time.  Dr. Phineas Real to assisting   . Abdominal hysterectomy  04/24/2012    Procedure: HYSTERECTOMY ABDOMINAL;  Surgeon: Terrance Mass, MD;  Location: [facility];  Service: Gynecology;;  . Esophagogastroduodenoscopy  10/2014    WNL  . Colonoscopy  10/2014    1 hyperplastic polyp, rpt 10 yrs Henrene Pastor)   Family History  Problem Relation Age of Onset  . Hypertension  Father   . Stroke Father     hemorrhagic, deceased  . Diabetes Mother   . Uterine cancer Mother   . Coronary artery disease Neg Hx   . Cancer Neg Hx    Social History  Substance Use Topics  . Smoking status: Never Smoker   . Smokeless tobacco: Never Used  . Alcohol Use: No   ROS See pertinent in HPI  Blood pressure 184/118, pulse 61, temperature 98.2 F (36.8 C), temperature source Oral, height 5\' 4"  (1.626 m), weight 162 lb 12.8 oz (73.846 kg), last menstrual period 11/12/2011. GENERAL: Well-developed, well-nourished female in no acute distress.  ABDOMEN: Soft, nontender, nondistended. No organomegaly. PELVIC: Normal external female genitalia. Vagina is pink and rugated.  Normal discharge. No adnexal mass but LLQ tenderness. EXTREMITIES: No cyanosis, clubbing, or edema, 2+ distal pulses.  02/2015 CT scan INDINGS: Lower chest: Clear lung bases. No significant pleural or pericardial effusion.  Hepatobiliary: Mild hepatic steatosis likely. There is no focal hepatic abnormality. No significant biliary dilatation status post cholecystectomy.  Pancreas: Unremarkable. No pancreatic ductal dilatation or surrounding inflammatory changes.  Spleen: Normal in size without focal abnormality. Accessory splenule appears unchanged.  Adrenals/Urinary Tract: Both adrenal glands appear normal.The kidneys appear normal without evidence of urinary tract calculus, suspicious lesion or hydronephrosis. No bladder abnormalities are seen.  Stomach/Bowel: No evidence of bowel wall thickening, distention or surrounding inflammatory change.The appendix is not visualized (previous appendectomy). Diverticular changes are present throughout the colon, greatest within the sigmoid.  Vascular/Lymphatic: There are no enlarged abdominal or pelvic lymph nodes. No significant vascular findings are present.  Reproductive: Previous hysterectomy. There is a low-density left adnexal mass measuring  6.2 x 5.0 x 6.0 cm. There is some ovarian tissue along the perimeter of this lesion. The right ovary appears normal.  Other: Postsurgical changes within the anterior pelvic wall. There is a focal soft tissue nodule measuring 1.6 cm on image number 78. This appears to be along the left aspect of the incision and could reflect a desmoid tumor or endometriosis implant.  Musculoskeletal: No acute or significant osseous findings. There is a stable bone island in the right supra-acetabular region. There are probable injection granulomas in both buttocks.  IMPRESSION: 1. No evidence of acute inflammation or bowel obstruction. 2. Moderate-size low-density left adnexal lesion status post partial hysterectomy. Given the size of this lesion, ultrasound correlation within 6-12 weeks recommended to further evaluate and/or document involution. 3. Diverticular changes throughout the colon without acute inflammation. 4. Soft tissue nodule within the left anterior pelvic wall along transverse incision, possibly desmoid tumor or endometriosis implant.   Electronically Signed  By: Richardean Sale M.D.  On: 02/06/2015 17:42  A/P 44 yo with LLQ pain - Pelvic ultrasound ordered - Wet prep collected - Patient will be contacted with any abnormal results -RTC in 2 weeks for results and further management

## 2015-08-09 LAB — WET PREP, GENITAL
Clue Cells Wet Prep HPF POC: NONE SEEN
TRICH WET PREP: NONE SEEN
YEAST WET PREP: NONE SEEN

## 2015-08-16 ENCOUNTER — Other Ambulatory Visit: Payer: Self-pay | Admitting: Obstetrics and Gynecology

## 2015-08-16 DIAGNOSIS — Z1231 Encounter for screening mammogram for malignant neoplasm of breast: Secondary | ICD-10-CM

## 2015-08-18 ENCOUNTER — Ambulatory Visit (HOSPITAL_COMMUNITY)
Admission: RE | Admit: 2015-08-18 | Discharge: 2015-08-18 | Disposition: A | Discharge status: Home / Self Care | Payer: Self-pay | Source: Ambulatory Visit | Attending: Obstetrics and Gynecology | Admitting: Obstetrics and Gynecology

## 2015-08-18 DIAGNOSIS — R1032 Left lower quadrant pain: Secondary | ICD-10-CM | POA: Insufficient documentation

## 2015-08-18 DIAGNOSIS — Z90710 Acquired absence of both cervix and uterus: Secondary | ICD-10-CM | POA: Insufficient documentation

## 2015-08-18 DIAGNOSIS — R1900 Intra-abdominal and pelvic swelling, mass and lump, unspecified site: Secondary | ICD-10-CM

## 2015-08-18 DIAGNOSIS — N7011 Chronic salpingitis: Secondary | ICD-10-CM | POA: Insufficient documentation

## 2015-09-04 ENCOUNTER — Other Ambulatory Visit: Payer: Self-pay | Admitting: Family Medicine

## 2015-09-13 ENCOUNTER — Other Ambulatory Visit: Payer: Self-pay | Admitting: Family Medicine

## 2015-10-07 ENCOUNTER — Other Ambulatory Visit: Payer: Self-pay | Admitting: Family Medicine

## 2016-03-12 ENCOUNTER — Encounter (HOSPITAL_COMMUNITY): Payer: Self-pay | Admitting: *Deleted

## 2016-03-12 DIAGNOSIS — K2970 Gastritis, unspecified, without bleeding: Secondary | ICD-10-CM | POA: Diagnosis present

## 2016-03-12 DIAGNOSIS — D481 Neoplasm of uncertain behavior of connective and other soft tissue: Principal | ICD-10-CM | POA: Diagnosis present

## 2016-03-12 DIAGNOSIS — E663 Overweight: Secondary | ICD-10-CM | POA: Diagnosis present

## 2016-03-12 DIAGNOSIS — E876 Hypokalemia: Secondary | ICD-10-CM | POA: Diagnosis present

## 2016-03-12 DIAGNOSIS — Z8673 Personal history of transient ischemic attack (TIA), and cerebral infarction without residual deficits: Secondary | ICD-10-CM

## 2016-03-12 DIAGNOSIS — Z23 Encounter for immunization: Secondary | ICD-10-CM

## 2016-03-12 DIAGNOSIS — E890 Postprocedural hypothyroidism: Secondary | ICD-10-CM | POA: Diagnosis present

## 2016-03-12 DIAGNOSIS — E785 Hyperlipidemia, unspecified: Secondary | ICD-10-CM | POA: Diagnosis present

## 2016-03-12 DIAGNOSIS — Z6825 Body mass index (BMI) 25.0-25.9, adult: Secondary | ICD-10-CM

## 2016-03-12 DIAGNOSIS — I10 Essential (primary) hypertension: Secondary | ICD-10-CM | POA: Diagnosis present

## 2016-03-12 DIAGNOSIS — J45909 Unspecified asthma, uncomplicated: Secondary | ICD-10-CM | POA: Diagnosis present

## 2016-03-12 LAB — URINALYSIS, ROUTINE W REFLEX MICROSCOPIC
BILIRUBIN URINE: NEGATIVE
GLUCOSE, UA: NEGATIVE mg/dL
HGB URINE DIPSTICK: NEGATIVE
KETONES UR: NEGATIVE mg/dL
Leukocytes, UA: NEGATIVE
NITRITE: NEGATIVE
PH: 5.5 (ref 5.0–8.0)
Protein, ur: NEGATIVE mg/dL
SPECIFIC GRAVITY, URINE: 1.024 (ref 1.005–1.030)

## 2016-03-12 LAB — CBC
HEMATOCRIT: 40.5 % (ref 36.0–46.0)
HEMOGLOBIN: 13.7 g/dL (ref 12.0–15.0)
MCH: 30 pg (ref 26.0–34.0)
MCHC: 33.8 g/dL (ref 30.0–36.0)
MCV: 88.6 fL (ref 78.0–100.0)
Platelets: 163 10*3/uL (ref 150–400)
RBC: 4.57 MIL/uL (ref 3.87–5.11)
RDW: 12.6 % (ref 11.5–15.5)
WBC: 8.6 10*3/uL (ref 4.0–10.5)

## 2016-03-12 LAB — COMPREHENSIVE METABOLIC PANEL
ALBUMIN: 3.9 g/dL (ref 3.5–5.0)
ALT: 33 U/L (ref 14–54)
ANION GAP: 9 (ref 5–15)
AST: 22 U/L (ref 15–41)
Alkaline Phosphatase: 81 U/L (ref 38–126)
BILIRUBIN TOTAL: 0.5 mg/dL (ref 0.3–1.2)
BUN: 22 mg/dL — AB (ref 6–20)
CHLORIDE: 103 mmol/L (ref 101–111)
CO2: 24 mmol/L (ref 22–32)
Calcium: 9.3 mg/dL (ref 8.9–10.3)
Creatinine, Ser: 0.72 mg/dL (ref 0.44–1.00)
GFR calc Af Amer: >60 mL/min (ref >60)
GFR calc non Af Amer: >60 mL/min (ref >60)
GLUCOSE: 116 mg/dL — AB (ref 65–99)
POTASSIUM: 3.4 mmol/L — AB (ref 3.5–5.1)
SODIUM: 136 mmol/L (ref 135–145)
Total Protein: 7.3 g/dL (ref 6.5–8.1)

## 2016-03-12 LAB — LIPASE, BLOOD: Lipase: 37 U/L (ref 11–51)

## 2016-03-12 NOTE — ED Notes (Signed)
Pt in c/o left flank pain with nausea for the last few days, denies vomiting, pain started in stomach and has radiated into back

## 2016-03-13 ENCOUNTER — Encounter (HOSPITAL_COMMUNITY): Payer: Self-pay | Admitting: Radiology

## 2016-03-13 ENCOUNTER — Inpatient Hospital Stay (HOSPITAL_COMMUNITY)
Admission: EM | Admit: 2016-03-13 | Discharge: 2016-03-14 | DRG: 566 | Disposition: A | Discharge status: Home / Self Care | Payer: Self-pay | Attending: Family Medicine | Admitting: Family Medicine

## 2016-03-13 ENCOUNTER — Emergency Department (HOSPITAL_COMMUNITY): Payer: Self-pay

## 2016-03-13 DIAGNOSIS — N809 Endometriosis, unspecified: Secondary | ICD-10-CM | POA: Diagnosis present

## 2016-03-13 DIAGNOSIS — E663 Overweight: Secondary | ICD-10-CM | POA: Diagnosis present

## 2016-03-13 DIAGNOSIS — E669 Obesity, unspecified: Secondary | ICD-10-CM | POA: Diagnosis present

## 2016-03-13 DIAGNOSIS — E039 Hypothyroidism, unspecified: Secondary | ICD-10-CM | POA: Diagnosis present

## 2016-03-13 DIAGNOSIS — J4520 Mild intermittent asthma, uncomplicated: Secondary | ICD-10-CM

## 2016-03-13 DIAGNOSIS — D481 Neoplasm of uncertain behavior of connective and other soft tissue: Secondary | ICD-10-CM

## 2016-03-13 DIAGNOSIS — R109 Unspecified abdominal pain: Secondary | ICD-10-CM | POA: Diagnosis present

## 2016-03-13 DIAGNOSIS — I10 Essential (primary) hypertension: Secondary | ICD-10-CM | POA: Diagnosis present

## 2016-03-13 DIAGNOSIS — J45909 Unspecified asthma, uncomplicated: Secondary | ICD-10-CM | POA: Diagnosis present

## 2016-03-13 DIAGNOSIS — Z6825 Body mass index (BMI) 25.0-25.9, adult: Secondary | ICD-10-CM | POA: Diagnosis present

## 2016-03-13 DIAGNOSIS — E876 Hypokalemia: Secondary | ICD-10-CM | POA: Diagnosis present

## 2016-03-13 HISTORY — DX: Migraine, unspecified, not intractable, without status migrainosus: G43.909

## 2016-03-13 HISTORY — DX: Cerebral infarction, unspecified: I63.9

## 2016-03-13 HISTORY — DX: Type 2 diabetes mellitus without complications: E11.9

## 2016-03-13 LAB — BASIC METABOLIC PANEL
Anion gap: 7 (ref 5–15)
BUN: 18 mg/dL (ref 6–20)
CHLORIDE: 104 mmol/L (ref 101–111)
CO2: 25 mmol/L (ref 22–32)
CREATININE: 0.76 mg/dL (ref 0.44–1.00)
Calcium: 8.6 mg/dL — ABNORMAL LOW (ref 8.9–10.3)
GFR calc non Af Amer: >60 mL/min (ref >60)
Glucose, Bld: 103 mg/dL — ABNORMAL HIGH (ref 65–99)
POTASSIUM: 3.3 mmol/L — AB (ref 3.5–5.1)
Sodium: 136 mmol/L (ref 135–145)

## 2016-03-13 LAB — PROTIME-INR
INR: 1.07 (ref 0.00–1.49)
PROTHROMBIN TIME: 14.1 s (ref 11.6–15.2)

## 2016-03-13 LAB — TYPE AND SCREEN
ABO/RH(D): B POS
Antibody Screen: NEGATIVE

## 2016-03-13 LAB — GLUCOSE, CAPILLARY: Glucose-Capillary: 100 mg/dL — ABNORMAL HIGH (ref 65–99)

## 2016-03-13 LAB — CBC
HEMATOCRIT: 39.4 % (ref 36.0–46.0)
HEMOGLOBIN: 13.1 g/dL (ref 12.0–15.0)
MCH: 29.4 pg (ref 26.0–34.0)
MCHC: 33.2 g/dL (ref 30.0–36.0)
MCV: 88.3 fL (ref 78.0–100.0)
PLATELETS: 149 10*3/uL — AB (ref 150–400)
RBC: 4.46 MIL/uL (ref 3.87–5.11)
RDW: 12.8 % (ref 11.5–15.5)
WBC: 8.3 10*3/uL (ref 4.0–10.5)

## 2016-03-13 LAB — HEPATIC FUNCTION PANEL
ALBUMIN: 3.4 g/dL — AB (ref 3.5–5.0)
ALK PHOS: 70 U/L (ref 38–126)
ALT: 43 U/L (ref 14–54)
AST: 50 U/L — AB (ref 15–41)
Bilirubin, Direct: 0.2 mg/dL (ref 0.1–0.5)
Indirect Bilirubin: 0.7 mg/dL (ref 0.3–0.9)
TOTAL PROTEIN: 6.8 g/dL (ref 6.5–8.1)
Total Bilirubin: 0.9 mg/dL (ref 0.3–1.2)

## 2016-03-13 LAB — LIPASE, BLOOD: Lipase: 40 U/L (ref 11–51)

## 2016-03-13 LAB — ABO/RH: ABO/RH(D): B POS

## 2016-03-13 LAB — APTT: APTT: 28 s (ref 24–37)

## 2016-03-13 MED ORDER — ACETAMINOPHEN 325 MG PO TABS: 650.0000 mg | ORAL_TABLET | Freq: Four times a day (QID) | ORAL | Status: DC | PRN

## 2016-03-13 MED ORDER — LEVOTHYROXINE SODIUM 25 MCG PO TABS: 25.0000 ug | ORAL_TABLET | Freq: Every day | ORAL | Status: DC

## 2016-03-13 MED ORDER — MORPHINE SULFATE (PF) 4 MG/ML IV SOLN
6.0000 mg | Freq: Once | INTRAVENOUS | Status: AC
  Administered 2016-03-13: 6 mg via INTRAVENOUS
  Filled 2016-03-13: qty 2

## 2016-03-13 MED ORDER — LEVOTHYROXINE SODIUM 200 MCG PO TABS: 200.0000 ug | ORAL_TABLET | Freq: Every day | ORAL | Status: DC

## 2016-03-13 MED ORDER — LISINOPRIL 10 MG PO TABS
5.0000 mg | ORAL_TABLET | Freq: Every day | ORAL | Status: DC
  Administered 2016-03-13: 5 mg via ORAL
  Filled 2016-03-13: qty 1

## 2016-03-13 MED ORDER — SODIUM CHLORIDE 0.9 % IV SOLN
INTRAVENOUS | Status: DC
  Administered 2016-03-13: 1000 mL via INTRAVENOUS

## 2016-03-13 MED ORDER — SODIUM CHLORIDE 0.9 % IV BOLUS (SEPSIS)
1000.0000 mL | Freq: Once | INTRAVENOUS | Status: AC
  Administered 2016-03-13: 1000 mL via INTRAVENOUS

## 2016-03-13 MED ORDER — IOPAMIDOL (ISOVUE-300) INJECTION 61%
INTRAVENOUS | Status: AC
  Administered 2016-03-13: 100 mL
  Filled 2016-03-13: qty 100

## 2016-03-13 MED ORDER — FAMOTIDINE 20 MG PO TABS
20.0000 mg | ORAL_TABLET | Freq: Every day | ORAL | Status: DC
  Administered 2016-03-13 – 2016-03-14 (×2): 20 mg via ORAL
  Filled 2016-03-13 (×2): qty 1

## 2016-03-13 MED ORDER — LEVOTHYROXINE SODIUM 75 MCG PO TABS
225.0000 ug | ORAL_TABLET | Freq: Every day | ORAL | Status: DC
  Administered 2016-03-13 – 2016-03-14 (×2): 225 ug via ORAL
  Filled 2016-03-13 (×2): qty 3

## 2016-03-13 MED ORDER — ACETAMINOPHEN 650 MG RE SUPP: 650.0000 mg | Freq: Four times a day (QID) | RECTAL | Status: DC | PRN

## 2016-03-13 MED ORDER — HYDROCODONE-ACETAMINOPHEN 5-325 MG PO TABS
2.0000 | ORAL_TABLET | ORAL | Status: DC | PRN
  Administered 2016-03-13 – 2016-03-14 (×2): 2 via ORAL
  Filled 2016-03-13 (×2): qty 2

## 2016-03-13 MED ORDER — CETYLPYRIDINIUM CHLORIDE 0.05 % MT LIQD
7.0000 mL | Freq: Two times a day (BID) | OROMUCOSAL | Status: DC
  Administered 2016-03-13 (×2): 7 mL via OROMUCOSAL

## 2016-03-13 MED ORDER — CHLORHEXIDINE GLUCONATE 0.12 % MT SOLN
15.0000 mL | Freq: Two times a day (BID) | OROMUCOSAL | Status: DC
  Administered 2016-03-13 – 2016-03-14 (×3): 15 mL via OROMUCOSAL
  Filled 2016-03-13 (×2): qty 15

## 2016-03-13 MED ORDER — POTASSIUM CHLORIDE 20 MEQ/15ML (10%) PO SOLN
10.0000 meq | Freq: Once | ORAL | Status: AC
  Administered 2016-03-13: 10 meq via ORAL
  Filled 2016-03-13: qty 15

## 2016-03-13 MED ORDER — ALBUTEROL SULFATE (2.5 MG/3ML) 0.083% IN NEBU: 2.5000 mg | INHALATION_SOLUTION | RESPIRATORY_TRACT | Status: DC | PRN

## 2016-03-13 MED ORDER — POTASSIUM CHLORIDE IN NACL 40-0.9 MEQ/L-% IV SOLN
INTRAVENOUS | Status: DC
  Administered 2016-03-13: 125 mL/h via INTRAVENOUS
  Filled 2016-03-13 (×6): qty 1000

## 2016-03-13 MED ORDER — HYDROMORPHONE HCL 1 MG/ML IJ SOLN
1.0000 mg | Freq: Once | INTRAMUSCULAR | Status: DC
  Filled 2016-03-13: qty 1

## 2016-03-13 MED ORDER — ONDANSETRON HCL 4 MG/2ML IJ SOLN
4.0000 mg | Freq: Once | INTRAMUSCULAR | Status: AC
  Administered 2016-03-13: 4 mg via INTRAVENOUS
  Filled 2016-03-13: qty 2

## 2016-03-13 MED ORDER — ONDANSETRON HCL 4 MG/2ML IJ SOLN
4.0000 mg | Freq: Three times a day (TID) | INTRAMUSCULAR | Status: DC | PRN
  Administered 2016-03-13 – 2016-03-14 (×2): 4 mg via INTRAVENOUS
  Filled 2016-03-13 (×2): qty 2

## 2016-03-13 MED ORDER — PNEUMOCOCCAL VAC POLYVALENT 25 MCG/0.5ML IJ INJ: 0.5000 mL | INJECTION | INTRAMUSCULAR | Status: DC

## 2016-03-13 MED ORDER — MONTELUKAST SODIUM 10 MG PO TABS
10.0000 mg | ORAL_TABLET | Freq: Every day | ORAL | Status: DC
  Administered 2016-03-13: 10 mg via ORAL
  Filled 2016-03-13: qty 1

## 2016-03-13 MED ORDER — KETOROLAC TROMETHAMINE 30 MG/ML IJ SOLN
30.0000 mg | Freq: Once | INTRAMUSCULAR | Status: AC
Start: 2016-03-13 — End: 2016-03-13
  Administered 2016-03-13: 30 mg via INTRAVENOUS
  Filled 2016-03-13: qty 1

## 2016-03-13 MED ORDER — AMLODIPINE BESYLATE 5 MG PO TABS
10.0000 mg | ORAL_TABLET | Freq: Every day | ORAL | Status: DC
  Administered 2016-03-13 – 2016-03-14 (×2): 10 mg via ORAL
  Filled 2016-03-13 (×2): qty 2

## 2016-03-13 MED ORDER — MORPHINE SULFATE (PF) 2 MG/ML IV SOLN
2.0000 mg | INTRAVENOUS | Status: DC | PRN
  Administered 2016-03-13 – 2016-03-14 (×8): 2 mg via INTRAVENOUS
  Filled 2016-03-13 (×8): qty 1

## 2016-03-13 NOTE — Progress Notes (Signed)
Pt. Asked to get a shower and i spoke to the nurse if a shower was ok, went inside the pt. Room explain how we could do the shower but RN would saline lock and i would wrap her arm or do the sink bath after i set her up and she first stated she would like a shower and as the RN was about to saline lock her she stated that she didn't wanted to get into the shower or take a bath at all

## 2016-03-13 NOTE — ED Notes (Signed)
Patient transported to CT 

## 2016-03-13 NOTE — Progress Notes (Signed)
Pt. Refused a bath when i offered she stated she wanted to do it a bit later

## 2016-03-13 NOTE — Progress Notes (Signed)
Patient seen and examined  45 y.o. female with medical history significant of hypertension, hyperlipidemia, asthma, hypothyroidism, CVA, ovarian cysts, obesity, who presents with abdominal pain.CT abdomen/pelvis showed a possible rapidly enlarged desmoid tumor (1.6 cm on 02/06/15-->2.3 cm today) over left abdomen and mild gastritis without complication. Pt is admitted to [place] bed. General surgeon, Dr. 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 General surgery was consulted. Recommendations pending, Currently patient has severe pain. CT scan also showed mild gastritis.  Will place on telemetry secondary to low potassium -When necessary Zofran for nausea -When necessary morphine and Norco for pain Continue antacid INR/PTT/type & screen -NPO - IVF: Normal saline with potassium -f/u general surgeon further recommendations  Hypothyroidism: Last TSH was 1.80 on 06/07/14 -Continue home Synthroid  HTN: Blood pressure 107/64. -continue home  amlodipine -hold HCTZ / lisinopril when pt is NPO and receiving IVF  Hypokalemia: K= 3.3 on admission. Replete  Asthma: stable. No wheezing or rhonchi on lung auscultation. -When necessary albuterol nebulizers -Continue Singulair  HLD: Last LDL was 155 on 11/12/13. Patient used to take lovastatin, but currently patient is not taking medications at home -Follow-up with PCP

## 2016-03-13 NOTE — Consult Note (Signed)
Reason for Consult: Subcutaneous mass lower abdomen Referring Physician: Dorrie Cocuzza is an 45 y.o. female.  HPI: [name] was admitted after 4 days of nausea, vomiting and intermittent diarrhea. Workup included CT scan of the abdomen and pelvis which did show gastritis. Additionally, showed a subcutaneous mass in her right lower abdomen. This was previously seen on this study but was a little bit larger this time. We are asked to see her in consultation regarding this mass. She denies localized pain in that area and mostly just complains of some crampy right upper abdominal pain associated with her gastritis. That has improved somewhat since she was admitted to the hospital. Of note, she has had cesarean section in the past.  Past Medical History  Diagnosis Date  . Hyperthyroidism 10/2000    RAI treatment,  multinodular goiter  . Ectopic pregnancy 04/2006    treated with medication  . Hepatic steatosis 07/2006, 04/2014    on Abd CT done for pain  . 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 Preterm delivery 12/2010    x3, placental abruption, maternal htn, DM  . Hypertension   . Anemia   . ABORTION, SPONTANEOUS 08/18/2007  . IMPAIRED GLUCOSE TOLERANCE 08/18/2007  . Acquired hypothyroidism   . Asthma   . Ovarian cyst 04/2014    Past Surgical History  Procedure Laterality Date  . Appendectomy  1997  . Cholecystectomy  1997  . Ovarian cyst removal    . Laparoscopic lysis intestinal adhesions    . Tubal ligation    . Thyroid surgery      goiter removed  . Cesarean section      x 1  . Laparoscopic hysterectomy  04/24/2012    Procedure: HYSTERECTOMY TOTAL LAPAROSCOPIC;  Surgeon: Terrance Mass, MD;  Location: [facility];  Service: Gynecology;  Laterality: N/A;  2 1/2 hours OR time.  Dr. Phineas Real to assisting   . Abdominal hysterectomy  04/24/2012    Procedure: HYSTERECTOMY ABDOMINAL;  Surgeon: Terrance Mass, MD;  Location: [facility];  Service:  Gynecology;;  . Esophagogastroduodenoscopy  10/2014    WNL  . Colonoscopy  10/2014    1 hyperplastic polyp, rpt 10 yrs Henrene Pastor)    Family History  Problem Relation Age of Onset  . Hypertension Father   . Stroke Father     hemorrhagic, deceased  . Diabetes Mother   . Uterine cancer Mother   . Coronary artery disease Neg Hx   . Cancer Neg Hx     Social History:  reports that she has never smoked. She has never used smokeless tobacco. She reports that she does not drink alcohol or use illicit drugs.  Allergies: No Known Allergies  Medications: I have reviewed the patient's current medications.  Results for orders placed or performed during the hospital encounter of 03/13/16 (from the past 48 hour(s))  Lipase, blood     Status: None   Collection Time: 03/12/16 10:05 PM  Result Value Ref Range   Lipase 37 11 - 51 U/L  Comprehensive metabolic panel     Status: Abnormal   Collection Time: 03/12/16 10:05 PM  Result Value Ref Range   Sodium 136 135 - 145 mmol/L   Potassium 3.4 (L) 3.5 - 5.1 mmol/L   Chloride 103 101 - 111 mmol/L   CO2 24 22 - 32 mmol/L   Glucose, Bld 116 (H) 65 - 99 mg/dL   BUN 22 (H) 6 - 20  mg/dL   Creatinine, Ser 0.72 0.44 - 1.00 mg/dL   Calcium 9.3 8.9 - 10.3 mg/dL   Total Protein 7.3 6.5 - 8.1 g/dL   Albumin 3.9 3.5 - 5.0 g/dL   AST 22 15 - 41 U/L   ALT 33 14 - 54 U/L   Alkaline Phosphatase 81 38 - 126 U/L   Total Bilirubin 0.5 0.3 - 1.2 mg/dL   GFR calc non Af Amer >60 >60 mL/min   GFR calc Af Amer >60 >60 mL/min    Comment: (NOTE) The eGFR has been calculated using the CKD EPI equation. This calculation has not been validated in all clinical situations. eGFR's persistently <60 mL/min signify possible Chronic Kidney Disease.    Anion gap 9 5 - 15  CBC     Status: None   Collection Time: 03/12/16 10:05 PM  Result Value Ref Range   WBC 8.6 4.0 - 10.5 K/uL   RBC 4.57 3.87 - 5.11 MIL/uL   Hemoglobin 13.7 12.0 - 15.0 g/dL   HCT 40.5 36.0 - 46.0 %    MCV 88.6 78.0 - 100.0 fL   MCH 30.0 26.0 - 34.0 pg   MCHC 33.8 30.0 - 36.0 g/dL   RDW 12.6 11.5 - 15.5 %   Platelets 163 150 - 400 K/uL  Urinalysis, Routine w reflex microscopic     Status: None   Collection Time: 03/12/16 10:08 PM  Result Value Ref Range   Color, Urine YELLOW YELLOW   APPearance CLEAR CLEAR   Specific Gravity, Urine 1.024 1.005 - 1.030   pH 5.5 5.0 - 8.0   Glucose, UA NEGATIVE NEGATIVE mg/dL   Hgb urine dipstick NEGATIVE NEGATIVE   Bilirubin Urine NEGATIVE NEGATIVE   Ketones, ur NEGATIVE NEGATIVE mg/dL   Protein, ur NEGATIVE NEGATIVE mg/dL   Nitrite NEGATIVE NEGATIVE   Leukocytes, UA NEGATIVE NEGATIVE    Comment: MICROSCOPIC NOT DONE ON URINES WITH NEGATIVE PROTEIN, BLOOD, LEUKOCYTES, NITRITE, OR GLUCOSE <1000 mg/dL.  Protime-INR     Status: None   Collection Time: 03/13/16  4:20 AM  Result Value Ref Range   Prothrombin Time 14.1 11.6 - 15.2 seconds   INR 1.07 0.00 - 1.49  APTT     Status: None   Collection Time: 03/13/16  4:20 AM  Result Value Ref Range   aPTT 28 24 - 37 seconds  Type and screen [place]     Status: None   Collection Time: 03/13/16  4:20 AM  Result Value Ref Range   ABO/RH(D) B POS    Antibody Screen NEG    Sample Expiration 03/16/2016   ABO/Rh     Status: None   Collection Time: 03/13/16  4:20 AM  Result Value Ref Range   ABO/RH(D) B POS   Basic metabolic panel     Status: Abnormal   Collection Time: 03/13/16  5:33 AM  Result Value Ref Range   Sodium 136 135 - 145 mmol/L   Potassium 3.3 (L) 3.5 - 5.1 mmol/L   Chloride 104 101 - 111 mmol/L   CO2 25 22 - 32 mmol/L   Glucose, Bld 103 (H) 65 - 99 mg/dL   BUN 18 6 - 20 mg/dL   Creatinine, Ser 0.76 0.44 - 1.00 mg/dL   Calcium 8.6 (L) 8.9 - 10.3 mg/dL   GFR calc non Af Amer >60 >60 mL/min   GFR calc Af Amer >60 >60 mL/min    Comment: (NOTE) The eGFR has been calculated using the  CKD EPI equation. This calculation has not been validated in all clinical  situations. eGFR's persistently <60 mL/min signify possible Chronic Kidney Disease.    Anion gap 7 5 - 15  CBC     Status: Abnormal   Collection Time: 03/13/16  5:33 AM  Result Value Ref Range   WBC 8.3 4.0 - 10.5 K/uL   RBC 4.46 3.87 - 5.11 MIL/uL   Hemoglobin 13.1 12.0 - 15.0 g/dL   HCT 39.4 36.0 - 46.0 %   MCV 88.3 78.0 - 100.0 fL   MCH 29.4 26.0 - 34.0 pg   MCHC 33.2 30.0 - 36.0 g/dL   RDW 12.8 11.5 - 15.5 %   Platelets 149 (L) 150 - 400 K/uL  Hepatic function panel     Status: Abnormal   Collection Time: 03/13/16  5:33 AM  Result Value Ref Range   Total Protein 6.8 6.5 - 8.1 g/dL   Albumin 3.4 (L) 3.5 - 5.0 g/dL   AST 50 (H) 15 - 41 U/L   ALT 43 14 - 54 U/L   Alkaline Phosphatase 70 38 - 126 U/L   Total Bilirubin 0.9 0.3 - 1.2 mg/dL   Bilirubin, Direct 0.2 0.1 - 0.5 mg/dL   Indirect Bilirubin 0.7 0.3 - 0.9 mg/dL  Lipase, blood     Status: None   Collection Time: 03/13/16  5:33 AM  Result Value Ref Range   Lipase 40 11 - 51 U/L  Glucose, capillary     Status: Abnormal   Collection Time: 03/13/16  7:42 AM  Result Value Ref Range   Glucose-Capillary 100 (H) 65 - 99 mg/dL   Comment 1 Notify RN     Ct Abdomen Pelvis W Contrast  03/13/2016  CLINICAL DATA:  LEFT flank pain, nausea for a few days. Epigastric pain radiating to stomach. EXAM: CT ABDOMEN AND PELVIS WITH CONTRAST TECHNIQUE: Multidetector CT imaging of the abdomen and pelvis was performed using the standard protocol following bolus administration of intravenous contrast. CONTRAST:  160m ISOVUE-300 IOPAMIDOL (ISOVUE-300) INJECTION 61% COMPARISON:  CT abdomen and pelvis February 06, 2015 FINDINGS: LUNG BASES: Included view of the lung bases are clear. Visualized heart and pericardium are unremarkable. SOLID ORGANS: The liver is diffusely hypodense compatible with steatosis. Status postcholecystectomy. Spleen, pancreas and adrenal glands are unremarkable. GASTROINTESTINAL TRACT: Mild distal stomach/ antral wall thickening  and edema. The small and large bowel are normal in course and caliber without inflammatory changes. Mild sigmoid diverticulosis. The appendix is not discretely identified, however there are no inflammatory changes in the right lower quadrant. KIDNEYS/ URINARY TRACT: Kidneys are orthotopic, demonstrating symmetric enhancement. No nephrolithiasis, hydronephrosis or solid renal masses. The unopacified ureters are normal in course and caliber. Delayed imaging through the kidneys demonstrates symmetric prompt contrast excretion within the proximal urinary collecting system. Urinary bladder is partially distended and unremarkable. PERITONEUM/RETROPERITONEUM: Aortoiliac vessels are normal in course and caliber. No lymphadenopathy by CT size criteria. Status post hysterectomy. 2.5 x 4.7 cm benign-appearing homogeneously hypodense RIGHT adnexal cyst, no indicated follow-up by imaging criteria. Small amount of free fluid in the pelvis is likely physiologic. SOFT TISSUE/OSSEOUS STRUCTURES: Enlarging 2.3 cm soft tissue mass within the LEFT anterior abdominal wall subcutaneous fat, previously 16 mm. Caesarean section scar. Gluteal injection granulomas. Mild sacroiliac osteoarthrosis. IMPRESSION: Mild gastritis without complication. 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 Electronically Signed   By: CThana FarrD.  On: 03/13/2016 02:50    Review of Systems  Constitutional: Positive for malaise/fatigue. Negative for fever.  HENT: Negative.   Eyes: Negative.   Respiratory: Negative for cough and hemoptysis.   Cardiovascular: Negative for chest pain.  Gastrointestinal: Positive for nausea, vomiting, abdominal pain and diarrhea.  Genitourinary: Negative.   Musculoskeletal: Negative.   Skin: Negative.   Neurological: Negative.   Endo/Heme/Allergies: Negative.   Psychiatric/Behavioral: Negative.     Blood pressure 147/83, pulse 87, temperature 98.1 F (36.7 C), temperature source Oral, resp. rate 18, height 5' 5"  (1.651 m), weight 73.619 kg (162 lb 4.8 oz), last menstrual period 11/12/2011, SpO2 98 %. Physical Exam  Constitutional: She is oriented to person, place, and time. She appears well-developed and well-nourished. No distress.  HENT:  Head: Normocephalic and atraumatic.  Right Ear: External ear normal.  Left Ear: External ear normal.  Mouth/Throat: Oropharynx is clear and moist.  Eyes: EOM are normal. Pupils are equal, round, and reactive to light.  Neck: Neck supple. No tracheal deviation present.  Cardiovascular: Normal rate and normal heart sounds.   Respiratory: Effort normal and breath sounds normal. No respiratory distress. She has no wheezes. She has no rales.  GI: Soft. She exhibits no distension. There is tenderness. There is no rebound and no guarding.  Minimal tenderness left upper quadrant, no generalized tenderness, no palpable subcutaneous mass right lower quadrant and no tenderness in that location  Musculoskeletal: Normal range of motion. She exhibits no edema.  Neurological: She is alert and oriented to person, place, and time. She exhibits normal muscle tone.  Skin: Skin is warm.  Psychiatric: She has a normal mood and affect.    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 Nothing urgent needs to be done. I will be happy to see her back in the office regarding this. If it continues to grow and or become symptomatic with cyclical pain, we will offer elective resection. I discussed this plan in detail with her and answered her questions.  Ranvir Renovato E 03/13/2016, 2:29 PM

## 2016-03-13 NOTE — Progress Notes (Signed)
Interpreter Lesle Chris for [place] RN admitting

## 2016-03-13 NOTE — Progress Notes (Signed)
Offered pt. A bath and pt. Stated she would do it later.

## 2016-03-13 NOTE — Progress Notes (Signed)
Pt. Refused linen change as well

## 2016-03-13 NOTE — ED Provider Notes (Signed)
CSN: [id number]     Arrival date & time 03/12/16  2130 History  By signing my name below, I, [name], attest that this documentation has been prepared under the direction and in the presence of Laura Balls, MD. Electronically Signed: Altamease [name], ED Scribe. 03/13/2016. 3:43 AM   Chief Complaint  Patient presents with  . Flank Pain    The history is provided by the patient and the spouse. A language interpreter was used (The patient is primarily [place] speaking).   [name] is a 45 y.o. female who presents to the Emergency Department complaining of constant, 7/10 in severity, left sided flank and abdominal pain with onset 3 days ago. The pain started in the lower left abdomen but is now higher.  Hydrocodone provided insufficient pain relief at home. The pain is exacerbated by breathing. She has been seen for similar pain in the past at a clinic on [address]. She had imaging at that time before being told that she has a hernia and was referred to surgery. Associated symptoms include nausea and diarrhea yesterday. Pt denies fever, vomiting, and cough.   Past Medical History  Diagnosis Date  . Hyperthyroidism 10/2000    RAI treatment,  multinodular goiter  . Ectopic pregnancy 04/2006    treated with medication  . Hepatic steatosis 07/2006, 04/2014    on Abd CT done for pain  . 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 Preterm delivery 12/2010    x3, placental abruption, maternal htn, DM  . Hypertension   . Anemia   . ABORTION, SPONTANEOUS 08/18/2007  . IMPAIRED GLUCOSE TOLERANCE 08/18/2007  . Acquired hypothyroidism   . Asthma   . Ovarian cyst 04/2014   Past Surgical History  Procedure Laterality Date  . Appendectomy  1997  . Cholecystectomy  1997  . Ovarian cyst removal    . Laparoscopic lysis intestinal adhesions    . Tubal ligation    . Thyroid surgery      goiter removed  . Cesarean section      x 1  . Laparoscopic  hysterectomy  04/24/2012    Procedure: HYSTERECTOMY TOTAL LAPAROSCOPIC;  Surgeon: Terrance Mass, MD;  Location: [facility];  Service: Gynecology;  Laterality: N/A;  2 1/2 hours OR time.  Dr. Phineas Real to assisting   . Abdominal hysterectomy  04/24/2012    Procedure: HYSTERECTOMY ABDOMINAL;  Surgeon: Terrance Mass, MD;  Location: [facility];  Service: Gynecology;;  . Esophagogastroduodenoscopy  10/2014    WNL  . Colonoscopy  10/2014    1 hyperplastic polyp, rpt 10 yrs Henrene Pastor)   Family History  Problem Relation Age of Onset  . Hypertension Father   . Stroke Father     hemorrhagic, deceased  . Diabetes Mother   . Uterine cancer Mother   . Coronary artery disease Neg Hx   . Cancer Neg Hx    Social History  Substance Use Topics  . Smoking status: Never Smoker   . Smokeless tobacco: Never Used  . Alcohol Use: No   OB History    Gravida Para Term Preterm AB TAB SAB Ectopic Multiple Living   5 1  1 3  3   1      Review of Systems  10 Systems reviewed and all are negative for acute change except as noted in the HPI.  Allergies  Review of patient's allergies indicates no known allergies.  Home Medications   Prior to Admission  medications   Medication Sig Start Date End Date Taking? Authorizing Provider  amLODipine (NORVASC) 10 MG tablet Take 1 tablet (10 mg total) by mouth daily. 12/10/14   Ria Bush, MD  dicyclomine (BENTYL) 10 MG capsule Take 1 tab 1 hours before meals and at bedtime. Patient not taking: Reported on 08/08/2015 09/27/14   Amy S Esterwood, PA-C  hydrochlorothiazide (HYDRODIURIL) 25 MG tablet Take one tablet daily **MUST HAVE PHYSICAL FOR FURTHER REFILLS** 09/06/15   Ria Bush, MD  HYDROcodone-acetaminophen (NORCO) 5-325 MG per tablet Take 2 tablets by mouth every 4 (four) hours as needed. Patient not taking: Reported on 08/08/2015 02/06/15   Comer Locket, PA-C  ibuprofen (ADVIL,MOTRIN) 600 MG tablet Take 1 tablet (600 mg total) by mouth every 6 (six) hours as  needed. 02/06/15   Comer Locket, PA-C  levothyroxine (SYNTHROID, LEVOTHROID) 200 MCG tablet Take one tablet daily **MUST HAVE PHYSICAL FOR FURTHER REFILLS** 09/13/15   Ria Bush, MD  levothyroxine (SYNTHROID, LEVOTHROID) 25 MCG tablet Take 25 mcg by mouth daily before breakfast.    Historical Provider, MD  levothyroxine (SYNTHROID, LEVOTHROID) 25 MCG tablet Take one tablet daily along with 217mcg **MUST HAVE PHYSICAL FOR FURTHER REFILLS** 09/13/15   Ria Bush, MD  lisinopril (PRINIVIL,ZESTRIL) 5 MG tablet Take one tablet daily **MUST HAVE PHYSICAL FOR FURTHER REFILLS** 09/06/15   Ria Bush, MD  lovastatin (MEVACOR) 20 MG tablet Take 1 tablet (20 mg total) by mouth at bedtime. Patient not taking: Reported on 08/08/2015 06/07/14   Ria Bush, MD  montelukast (SINGULAIR) 10 MG tablet TAKE 1 TABLET BY MOUTH AT BEDTIME 12/08/14   Ria Bush, MD  ondansetron [facility] ODT) 4 MG disintegrating tablet 4mg  ODT q4 hours prn nausea/vomit Patient not taking: Reported on 08/08/2015 02/06/15   Comer Locket, PA-C  oseltamivir (TAMIFLU) 75 MG capsule Take 1 capsule (75 mg total) by mouth 2 (two) times daily. Patient not taking: Reported on 08/08/2015 10/27/14   Laura Napoleon, NP   BP 138/102 mmHg  Pulse 96  Temp(Src) 98 F (36.7 C) (Oral)  Resp 19  Ht 5\' 5"  (1.651 m)  Wt 162 lb 4.8 oz (73.619 kg)  BMI 27.01 kg/m2  SpO2 94%  LMP 11/12/2011 Physical Exam  Constitutional: She is oriented to person, place, and time. She appears well-developed and well-nourished. No distress.  HENT:  Head: Normocephalic and atraumatic.  Nose: Nose normal.  Mouth/Throat: Oropharynx is clear and moist. No oropharyngeal exudate.  Eyes: Conjunctivae and EOM are normal. Pupils are equal, round, and reactive to light. No scleral icterus.  Neck: Normal range of motion. Neck supple. No JVD present. No tracheal deviation present. No thyromegaly present.  Cardiovascular: Normal rate, regular rhythm and normal  heart sounds.  Exam reveals no gallop and no friction rub.   No murmur heard. Pulmonary/Chest: Effort normal and breath sounds normal. No respiratory distress. She has no wheezes. She exhibits no tenderness.  Abdominal: Soft. Bowel sounds are normal. She exhibits no distension and no mass. There is tenderness. There is no rebound and no guarding.  Periumbilical, LLQ, and LUQ TTP  Musculoskeletal: Normal range of motion. She exhibits no edema or tenderness.  Lymphadenopathy:    She has no cervical adenopathy.  Neurological: She is alert and oriented to person, place, and time. No cranial nerve deficit. She exhibits normal muscle tone.  Skin: Skin is warm and dry. No rash noted. No erythema. No pallor.  Nursing note and vitals reviewed.   ED Course  Procedures (including critical care time)  DIAGNOSTIC STUDIES: Oxygen Saturation is 94% on RA,  normal by my interpretation.    COORDINATION OF CARE: 1:34 AM Discussed treatment plan which includes lab work and CT A/P with pt at bedside and pt agreed to plan.  3:28 AM-Consult complete with Dr.Cornett (General Surgery). Patient case explained and discussed. Advised to admit to medicine.  Call ended at 3:29 AM   Labs Review Labs Reviewed  COMPREHENSIVE METABOLIC PANEL - Abnormal; Notable for the following:    Potassium 3.4 (*)    Glucose, Bld 116 (*)    BUN 22 (*)    All other components within normal limits  LIPASE, BLOOD  CBC  URINALYSIS, ROUTINE W REFLEX MICROSCOPIC (NOT AT [facility])    Imaging Review Ct Abdomen Pelvis W Contrast  03/13/2016  CLINICAL DATA:  LEFT flank pain, nausea for a few days. Epigastric pain radiating to stomach. EXAM: CT ABDOMEN AND PELVIS WITH CONTRAST TECHNIQUE: Multidetector CT imaging of the abdomen and pelvis was performed using the standard protocol following bolus administration of intravenous contrast. CONTRAST:  168mL ISOVUE-300 IOPAMIDOL (ISOVUE-300) INJECTION 61% COMPARISON:  CT abdomen and pelvis February 06, 2015 FINDINGS: LUNG BASES: Included view of the lung bases are clear. Visualized heart and pericardium are unremarkable. SOLID ORGANS: The liver is diffusely hypodense compatible with steatosis. Status postcholecystectomy. Spleen, pancreas and adrenal glands are unremarkable. GASTROINTESTINAL TRACT: Mild distal stomach/ antral wall thickening and edema. The small and large bowel are normal in course and caliber without inflammatory changes. Mild sigmoid diverticulosis. The appendix is not discretely identified, however there are no inflammatory changes in the right lower quadrant. KIDNEYS/ URINARY TRACT: Kidneys are orthotopic, demonstrating symmetric enhancement. No nephrolithiasis, hydronephrosis or solid renal masses. The unopacified ureters are normal in course and caliber. Delayed imaging through the kidneys demonstrates symmetric prompt contrast excretion within the proximal urinary collecting system. Urinary bladder is partially distended and unremarkable. PERITONEUM/RETROPERITONEUM: Aortoiliac vessels are normal in course and caliber. No lymphadenopathy by CT size criteria. Status post hysterectomy. 2.5 x 4.7 cm benign-appearing homogeneously hypodense RIGHT adnexal cyst, no indicated follow-up by imaging criteria. Small amount of free fluid in the pelvis is likely physiologic. SOFT TISSUE/OSSEOUS STRUCTURES: Enlarging 2.3 cm soft tissue mass within the LEFT anterior abdominal wall subcutaneous fat, previously 16 mm. Caesarean section scar. Gluteal injection granulomas. Mild sacroiliac osteoarthrosis. IMPRESSION: Mild gastritis without complication. 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 Electronically Signed   By: Elon Alas M.D.   On: 03/13/2016 02:50   I have personally reviewed and evaluated these images and lab results as part of my medical decision-making.   EKG  Interpretation None      MDM   Final diagnoses:  None   Patient presents emergency department for worsening flank pain. She states she feels like something is growing inside of her. 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 She was given morphine, Toradol, Dilaudid. I cannot get her pain under control. I spoke with Dr. Brantley Stage with general surgery who advises for medical admission for pain control. I spoke with Dr. Porfirio Mylar who will admit the patient for further care. Patient may require operative intervention to treat her pain.      I personally performed the services described in this documentation, which was scribed in my presence. The recorded information has been reviewed and is accurate.     Laura Balls, MD 03/13/16 [phone]

## 2016-03-13 NOTE — ED Notes (Signed)
RN attempted to call report; RN to call back; 

## 2016-03-13 NOTE — H&P (Signed)
History and Physical    [name] [id number] DOB: [birth year] DOA: 03/13/2016  Referring MD/NP/PA:   PCP: Ria Bush, MD   Patient coming from:  The patient is coming from home.  At baseline, pt is independent for most of ADL.      Chief Complaint: Abdominal pain  HPI: [name] is a 45 y.o. female with medical history significant of hypertension, hyperlipidemia, asthma, hypothyroidism, CVA, ovarian cysts, obesity, who presents with abdominal pain.  Patient reports that she has been having abdominal pain for 5 days. The abdominal pain is located in the left middle quadrant, constant, 10 out of 10 in severity, radiating to the left flank area. It is associated with nausea, but no vomiting, diarrhea. Patient has mild cough due to asthma, which is at baseline. No shortness of breath, chest pain, fever or chills. Patient does not have symptoms of UTI or unilateral weakness.  ED Course: pt was found to have WBCs 8.6, lipase is 37, negative urinalysis, temperature normal, no tachycardia, potassium 3.4, creatinine normal. CT abdomen/pelvis showed a possible rapidly enlarged desmoid tumor (1.6 cm on 02/06/15-->2.3 cm today) over left abdomen and mild gastritis without complication. Pt is admitted to [facility] bed. General surgeon, Dr. Brantley Stage was consulted by EP.  Review of Systems:   General: no fevers, chills, no changes in body weight, has poor appetite, has fatigue HEENT: no blurry vision, hearing changes or sore throat Pulm: no dyspnea, has coughing, no wheezing CV: no chest pain, no palpitations Abd: has nausea,  abdominal pain, no diarrhea, constipation, vomiting, GU: no dysuria, burning on urination, increased urinary frequency, hematuria  Ext: no leg edema Neuro: no unilateral weakness, numbness, or tingling, no vision change or hearing loss Skin: no rash MSK: No muscle spasm, no deformity, no limitation of range of movement in spin Heme: No easy bruising.  Travel  history: No recent long distant travel.  Allergy: No Known Allergies  Past Medical History  Diagnosis Date  . Hyperthyroidism 10/2000    RAI treatment,  multinodular goiter  . Ectopic pregnancy 04/2006    treated with medication  . Hepatic steatosis 07/2006, 04/2014    on Abd CT done for pain  . 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 Preterm delivery 12/2010    x3, placental abruption, maternal htn, DM  . Hypertension   . Anemia   . ABORTION, SPONTANEOUS 08/18/2007  . IMPAIRED GLUCOSE TOLERANCE 08/18/2007  . Acquired hypothyroidism   . Asthma   . Ovarian cyst 04/2014    Past Surgical History  Procedure Laterality Date  . Appendectomy  1997  . Cholecystectomy  1997  . Ovarian cyst removal    . Laparoscopic lysis intestinal adhesions    . Tubal ligation    . Thyroid surgery      goiter removed  . Cesarean section      x 1  . Laparoscopic hysterectomy  04/24/2012    Procedure: HYSTERECTOMY TOTAL LAPAROSCOPIC;  Surgeon: Terrance Mass, MD;  Location: [facility];  Service: Gynecology;  Laterality: N/A;  2 1/2 hours OR time.  Dr. Phineas Real to assisting   . Abdominal hysterectomy  04/24/2012    Procedure: HYSTERECTOMY ABDOMINAL;  Surgeon: Terrance Mass, MD;  Location: [facility];  Service: Gynecology;;  . Esophagogastroduodenoscopy  10/2014    WNL  . Colonoscopy  10/2014    1 hyperplastic polyp, rpt 10 yrs Henrene Pastor)    Social History:  reports that she has never  smoked. She has never used smokeless tobacco. She reports that she does not drink alcohol or use illicit drugs.  Family History:  Family History  Problem Relation Age of Onset  . Hypertension Father   . Stroke Father     hemorrhagic, deceased  . Diabetes Mother   . Uterine cancer Mother   . Coronary artery disease Neg Hx   . Cancer Neg Hx      Prior to Admission medications   Medication Sig Start Date End Date Taking? Authorizing Provider  amLODipine (NORVASC) 10 MG tablet Take 1 tablet  (10 mg total) by mouth daily. 12/10/14   Ria Bush, MD  dicyclomine (BENTYL) 10 MG capsule Take 1 tab 1 hours before meals and at bedtime. Patient not taking: Reported on 08/08/2015 09/27/14   Amy S Esterwood, PA-C  hydrochlorothiazide (HYDRODIURIL) 25 MG tablet Take one tablet daily **MUST HAVE PHYSICAL FOR FURTHER REFILLS** 09/06/15   Ria Bush, MD  HYDROcodone-acetaminophen (NORCO) 5-325 MG per tablet Take 2 tablets by mouth every 4 (four) hours as needed. Patient not taking: Reported on 08/08/2015 02/06/15   Comer Locket, PA-C  ibuprofen (ADVIL,MOTRIN) 600 MG tablet Take 1 tablet (600 mg total) by mouth every 6 (six) hours as needed. 02/06/15   Comer Locket, PA-C  levothyroxine (SYNTHROID, LEVOTHROID) 200 MCG tablet Take one tablet daily **MUST HAVE PHYSICAL FOR FURTHER REFILLS** 09/13/15   Ria Bush, MD  levothyroxine (SYNTHROID, LEVOTHROID) 25 MCG tablet Take 25 mcg by mouth daily before breakfast.    Historical Provider, MD  levothyroxine (SYNTHROID, LEVOTHROID) 25 MCG tablet Take one tablet daily along with 241mcg **MUST HAVE PHYSICAL FOR FURTHER REFILLS** 09/13/15   Ria Bush, MD  lisinopril (PRINIVIL,ZESTRIL) 5 MG tablet Take one tablet daily **MUST HAVE PHYSICAL FOR FURTHER REFILLS** 09/06/15   Ria Bush, MD  lovastatin (MEVACOR) 20 MG tablet Take 1 tablet (20 mg total) by mouth at bedtime. Patient not taking: Reported on 08/08/2015 06/07/14   Ria Bush, MD  montelukast (SINGULAIR) 10 MG tablet TAKE 1 TABLET BY MOUTH AT BEDTIME 12/08/14   Ria Bush, MD  ondansetron [facility] ODT) 4 MG disintegrating tablet 4mg  ODT q4 hours prn nausea/vomit Patient not taking: Reported on 08/08/2015 02/06/15   Comer Locket, PA-C  oseltamivir (TAMIFLU) 75 MG capsule Take 1 capsule (75 mg total) by mouth 2 (two) times daily. Patient not taking: Reported on 08/08/2015 10/27/14   Janne Napoleon, NP    Physical Exam: Filed Vitals:   03/12/16 2135 03/13/16 0115 03/13/16 0130  03/13/16 0200  BP: 138/102 125/81 129/88 107/64  Pulse: 96 83 83 83  Temp: 98 F (36.7 C)     TempSrc: Oral     Resp: 19     Height: 5\' 5"  (1.651 m)     Weight: 73.619 kg (162 lb 4.8 oz)     SpO2: 94% 97% 98% 98%   General: Not in acute distress HEENT:       Eyes: PERRL, EOMI, no scleral icterus.       ENT: No discharge from the ears and nose, no pharynx injection, no tonsillar enlargement.        Neck: No JVD, no bruit, no mass felt. Heme: No neck lymph node enlargement. Cardiac: S1/S2, RRR, No murmurs, No gallops or rubs. Pulm: No rales, wheezing, rhonchi or rubs. Abd: Soft, nondistended, very tender over LMQ, no rebound pain, no organomegaly, BS present. GU: No hematuria Ext: No pitting leg edema bilaterally. 2+DP/PT pulse bilaterally. Musculoskeletal: No joint deformities, No joint  redness or warmth, no limitation of ROM in spin. Skin: No rashes.  Neuro: Alert, oriented X3, cranial nerves II-XII grossly intact, moves all extremities normally. Psych: Patient is not psychotic, no suicidal or hemocidal ideation.  Labs on Admission: I have personally reviewed following labs and imaging studies  CBC:  Recent Labs Lab 03/12/16 2205  WBC 8.6  HGB 13.7  HCT 40.5  MCV 88.6  PLT [id number]   Basic Metabolic Panel:  Recent Labs Lab 03/12/16 2205  NA 136  K 3.4*  CL 103  CO2 24  GLUCOSE 116*  BUN 22*  CREATININE 0.72  CALCIUM 9.3   GFR: Estimated Creatinine Clearance: 89.2 mL/min (by C-G formula based on Cr of 0.72). Liver Function Tests:  Recent Labs Lab 03/12/16 2205  AST 22  ALT 33  ALKPHOS 81  BILITOT 0.5  PROT 7.3  ALBUMIN 3.9    Recent Labs Lab 03/12/16 2205  LIPASE 37   No results for input(s): AMMONIA in the last 168 hours. Coagulation Profile: No results for input(s): INR, PROTIME in the last 168 hours. Cardiac Enzymes: No results for input(s): CKTOTAL, CKMB, CKMBINDEX, TROPONINI in the last 168 hours. BNP (last 3 results) No results for  input(s): PROBNP in the last 8760 hours. HbA1C: No results for input(s): HGBA1C in the last 72 hours. CBG: No results for input(s): GLUCAP in the last 168 hours. Lipid Profile: No results for input(s): CHOL, HDL, LDLCALC, TRIG, CHOLHDL, LDLDIRECT in the last 72 hours. Thyroid Function Tests: No results for input(s): TSH, T4TOTAL, FREET4, T3FREE, THYROIDAB in the last 72 hours. Anemia Panel: No results for input(s): VITAMINB12, FOLATE, FERRITIN, TIBC, IRON, RETICCTPCT in the last 72 hours. Urine analysis:    Component Value Date/Time   COLORURINE YELLOW 03/12/2016 2208   APPEARANCEUR CLEAR 03/12/2016 2208   LABSPEC 1.024 03/12/2016 2208   PHURINE 5.5 03/12/2016 2208   GLUCOSEU NEGATIVE 03/12/2016 2208   HGBUR NEGATIVE 03/12/2016 2208   BILIRUBINUR NEGATIVE 03/12/2016 2208   BILIRUBINUR neg 09/21/2014 1406   KETONESUR NEGATIVE 03/12/2016 2208   PROTEINUR NEGATIVE 03/12/2016 2208   PROTEINUR neg 09/21/2014 1406   UROBILINOGEN 1.0 02/06/2015 1636   UROBILINOGEN negative 09/21/2014 1406   NITRITE NEGATIVE 03/12/2016 2208   NITRITE neg 09/21/2014 1406   LEUKOCYTESUR NEGATIVE 03/12/2016 2208   Sepsis Labs: @LABRCNTIP (procalcitonin:4,lacticidven:4) )No results found for this or any previous visit (from the past 240 hour(s)).   Radiological Exams on Admission: Ct Abdomen Pelvis W Contrast  03/13/2016  CLINICAL DATA:  LEFT flank pain, nausea for a few days. Epigastric pain radiating to stomach. EXAM: CT ABDOMEN AND PELVIS WITH CONTRAST TECHNIQUE: Multidetector CT imaging of the abdomen and pelvis was performed using the standard protocol following bolus administration of intravenous contrast. CONTRAST:  170mL ISOVUE-300 IOPAMIDOL (ISOVUE-300) INJECTION 61% COMPARISON:  CT abdomen and pelvis February 06, 2015 FINDINGS: LUNG BASES: Included view of the lung bases are clear. Visualized heart and pericardium are unremarkable. SOLID ORGANS: The liver is diffusely hypodense compatible with steatosis.  Status postcholecystectomy. Spleen, pancreas and adrenal glands are unremarkable. GASTROINTESTINAL TRACT: Mild distal stomach/ antral wall thickening and edema. The small and large bowel are normal in course and caliber without inflammatory changes. Mild sigmoid diverticulosis. The appendix is not discretely identified, however there are no inflammatory changes in the right lower quadrant. KIDNEYS/ URINARY TRACT: Kidneys are orthotopic, demonstrating symmetric enhancement. No nephrolithiasis, hydronephrosis or solid renal masses. The unopacified ureters are normal in course and caliber. Delayed imaging through the kidneys demonstrates symmetric  prompt contrast excretion within the proximal urinary collecting system. Urinary bladder is partially distended and unremarkable. PERITONEUM/RETROPERITONEUM: Aortoiliac vessels are normal in course and caliber. No lymphadenopathy by CT size criteria. Status post hysterectomy. 2.5 x 4.7 cm benign-appearing homogeneously hypodense RIGHT adnexal cyst, no indicated follow-up by imaging criteria. Small amount of free fluid in the pelvis is likely physiologic. SOFT TISSUE/OSSEOUS STRUCTURES: Enlarging 2.3 cm soft tissue mass within the LEFT anterior abdominal wall subcutaneous fat, previously 16 mm. Caesarean section scar. Gluteal injection granulomas. Mild sacroiliac osteoarthrosis. IMPRESSION: Mild gastritis without complication. 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 Electronically Signed   By: Elon Alas M.D.   On: 03/13/2016 02:50     EKG: Not done in ED, will get one.   Assessment/Plan Principal Problem:   Desmoid tumor of abdomen Active Problems:   Hypothyroidism   Overweight (BMI 25.0-29.9)   HYPERTENSION, BENIGN SYSTEMIC   Asthma   Abdominal pain   Hypokalemia   Desmoid tumor of abdomen: Patient's abdominal pain is most likely due to  rapidly growing desmoid tumor. General surgery was consulted. Currently patient has severe pain. CT scan also showed mild gastritis.  -will admit to med-surg bed as inpt -When necessary Zofran for nausea -When necessary morphine and Norco for pain -Start pepcide  INR/PTT/type & screen -NPO - IVF: 1L NS and then 100 cc/h -f/u general surgeon further recommendations  Hypothyroidism: Last TSH was 1.80 on 06/07/14 -Continue home Synthroid  HTN: Blood pressure 107/64. -continue home lisinopril, amlodipine -hold HCTZ when pt is NPO and receiving IVF  Hypokalemia: K= 3.4 on admission. - Repleted  Asthma: stable. No wheezing or rhonchi on lung auscultation. -When necessary albuterol nebulizers -Continue Singulair  HLD: Last LDL was 155 on 11/12/13. Patient used to take lovastatin, but currently patient is not taking medications at home -Follow-up with PCP    DVT ppx: SCD Code Status: Full code Family Communication: Yes, patient's husband at bed side Disposition Plan:  Anticipate discharge back to previous home environment Consults called:  Education officer, environmental, Dr. Brantley Stage Admission status:  medical floor/obs  Date of Service 03/13/2016    Ivor Costa [facility] Pager [phone]  If 7PM-7AM, please contact night-coverage [email or web address] Password [email or web address] 03/13/2016, 4:31 AM

## 2016-03-14 ENCOUNTER — Encounter (HOSPITAL_COMMUNITY): Payer: Self-pay

## 2016-03-14 ENCOUNTER — Emergency Department (HOSPITAL_COMMUNITY)
Admission: EM | Admit: 2016-03-14 | Discharge: 2016-03-15 | Disposition: A | Discharge status: Home / Self Care | Payer: MEDICAID | Attending: Emergency Medicine | Admitting: Emergency Medicine

## 2016-03-14 DIAGNOSIS — J45909 Unspecified asthma, uncomplicated: Secondary | ICD-10-CM | POA: Insufficient documentation

## 2016-03-14 DIAGNOSIS — I10 Essential (primary) hypertension: Secondary | ICD-10-CM | POA: Insufficient documentation

## 2016-03-14 DIAGNOSIS — R1032 Left lower quadrant pain: Secondary | ICD-10-CM

## 2016-03-14 DIAGNOSIS — Z8673 Personal history of transient ischemic attack (TIA), and cerebral infarction without residual deficits: Secondary | ICD-10-CM | POA: Insufficient documentation

## 2016-03-14 DIAGNOSIS — R222 Localized swelling, mass and lump, trunk: Secondary | ICD-10-CM | POA: Insufficient documentation

## 2016-03-14 DIAGNOSIS — E039 Hypothyroidism, unspecified: Secondary | ICD-10-CM

## 2016-03-14 DIAGNOSIS — R112 Nausea with vomiting, unspecified: Secondary | ICD-10-CM | POA: Insufficient documentation

## 2016-03-14 DIAGNOSIS — E119 Type 2 diabetes mellitus without complications: Secondary | ICD-10-CM | POA: Insufficient documentation

## 2016-03-14 DIAGNOSIS — R197 Diarrhea, unspecified: Secondary | ICD-10-CM | POA: Insufficient documentation

## 2016-03-14 DIAGNOSIS — Z7984 Long term (current) use of oral hypoglycemic drugs: Secondary | ICD-10-CM | POA: Insufficient documentation

## 2016-03-14 DIAGNOSIS — D481 Neoplasm of uncertain behavior of connective and other soft tissue: Principal | ICD-10-CM

## 2016-03-14 DIAGNOSIS — Z79899 Other long term (current) drug therapy: Secondary | ICD-10-CM | POA: Insufficient documentation

## 2016-03-14 LAB — HCG, SERUM, QUALITATIVE: Preg, Serum: NEGATIVE

## 2016-03-14 LAB — COMPREHENSIVE METABOLIC PANEL
ALBUMIN: 3 g/dL — AB (ref 3.5–5.0)
ALK PHOS: 82 U/L (ref 38–126)
ALT: 127 U/L — AB (ref 14–54)
ANION GAP: 5 (ref 5–15)
AST: 77 U/L — AB (ref 15–41)
BILIRUBIN TOTAL: 0.7 mg/dL (ref 0.3–1.2)
BUN: 12 mg/dL (ref 6–20)
CALCIUM: 8.4 mg/dL — AB (ref 8.9–10.3)
CO2: 26 mmol/L (ref 22–32)
CREATININE: 0.58 mg/dL (ref 0.44–1.00)
Chloride: 108 mmol/L (ref 101–111)
GFR calc Af Amer: >60 mL/min (ref >60)
GFR calc non Af Amer: >60 mL/min (ref >60)
GLUCOSE: 95 mg/dL (ref 65–99)
Potassium: 4.7 mmol/L (ref 3.5–5.1)
SODIUM: 139 mmol/L (ref 135–145)
TOTAL PROTEIN: 6 g/dL — AB (ref 6.5–8.1)

## 2016-03-14 LAB — BASIC METABOLIC PANEL
Anion gap: 6 (ref 5–15)
BUN: 10 mg/dL (ref 6–20)
CALCIUM: 9 mg/dL (ref 8.9–10.3)
CHLORIDE: 105 mmol/L (ref 101–111)
CO2: 24 mmol/L (ref 22–32)
CREATININE: 0.64 mg/dL (ref 0.44–1.00)
GFR calc non Af Amer: >60 mL/min (ref >60)
GLUCOSE: 114 mg/dL — AB (ref 65–99)
Potassium: 4.5 mmol/L (ref 3.5–5.1)
Sodium: 135 mmol/L (ref 135–145)

## 2016-03-14 LAB — CBC
HEMATOCRIT: 38.3 % (ref 36.0–46.0)
HEMOGLOBIN: 12.5 g/dL (ref 12.0–15.0)
MCH: 29 pg (ref 26.0–34.0)
MCHC: 32.6 g/dL (ref 30.0–36.0)
MCV: 88.9 fL (ref 78.0–100.0)
Platelets: 143 10*3/uL — ABNORMAL LOW (ref 150–400)
RBC: 4.31 MIL/uL (ref 3.87–5.11)
RDW: 12.6 % (ref 11.5–15.5)
WBC: 5.6 10*3/uL (ref 4.0–10.5)

## 2016-03-14 LAB — CBC WITH DIFFERENTIAL/PLATELET
BASOS ABS: 0 10*3/uL (ref 0.0–0.1)
Basophils Relative: 0 %
EOS PCT: 1 %
Eosinophils Absolute: 0.1 10*3/uL (ref 0.0–0.7)
HEMATOCRIT: 42 % (ref 36.0–46.0)
Hemoglobin: 13.7 g/dL (ref 12.0–15.0)
LYMPHS ABS: 1.1 10*3/uL (ref 0.7–4.0)
LYMPHS PCT: 10 %
MCH: 28.9 pg (ref 26.0–34.0)
MCHC: 32.6 g/dL (ref 30.0–36.0)
MCV: 88.6 fL (ref 78.0–100.0)
MONO ABS: 0.5 10*3/uL (ref 0.1–1.0)
Monocytes Relative: 5 %
NEUTROS ABS: 9.4 10*3/uL — AB (ref 1.7–7.7)
Neutrophils Relative %: 84 %
Platelets: 166 10*3/uL (ref 150–400)
RBC: 4.74 MIL/uL (ref 3.87–5.11)
RDW: 12.6 % (ref 11.5–15.5)
WBC: 11 10*3/uL — ABNORMAL HIGH (ref 4.0–10.5)

## 2016-03-14 LAB — LIPASE, BLOOD: Lipase: 27 U/L (ref 11–51)

## 2016-03-14 LAB — GLUCOSE, CAPILLARY: Glucose-Capillary: 93 mg/dL (ref 65–99)

## 2016-03-14 MED ORDER — AMLODIPINE BESYLATE 5 MG PO TABS: 5.0000 mg | ORAL_TABLET | Freq: Every day | ORAL | Status: DC

## 2016-03-14 MED ORDER — HYDROMORPHONE HCL 1 MG/ML IJ SOLN
1.0000 mg | Freq: Once | INTRAMUSCULAR | Status: AC
Start: 2016-03-14 — End: 2016-03-14
  Administered 2016-03-14: 1 mg via INTRAVENOUS
  Filled 2016-03-14: qty 1

## 2016-03-14 MED ORDER — FAMOTIDINE 20 MG PO TABS: 20.0000 mg | ORAL_TABLET | Freq: Two times a day (BID) | ORAL | Status: DC

## 2016-03-14 MED ORDER — ONDANSETRON HCL 4 MG/2ML IJ SOLN
4.0000 mg | Freq: Once | INTRAMUSCULAR | Status: AC
  Administered 2016-03-14: 4 mg via INTRAVENOUS
  Filled 2016-03-14: qty 2

## 2016-03-14 MED ORDER — SODIUM CHLORIDE 0.9 % IV BOLUS (SEPSIS)
1000.0000 mL | Freq: Once | INTRAVENOUS | Status: AC
  Administered 2016-03-14: 1000 mL via INTRAVENOUS

## 2016-03-14 MED ORDER — HYDROCODONE-ACETAMINOPHEN 5-325 MG PO TABS: 1.0000 | ORAL_TABLET | Freq: Four times a day (QID) | ORAL | Status: DC | PRN

## 2016-03-14 MED ORDER — LEVOTHYROXINE SODIUM 100 MCG PO TABS: 200.0000 ug | ORAL_TABLET | Freq: Every day | ORAL | Status: DC

## 2016-03-14 NOTE — Progress Notes (Signed)
Discharge papers gone over with pt. And her husband. Papers given in English/Spanish. No questions/complaints. IV taken out. Prescriptions given to pt. Pt. D/c'd successfully via w/c.

## 2016-03-14 NOTE — ED Notes (Addendum)
Patient just discharged from hospital 2 hours prior to arrival to ED and here with increased abdominal pain. Spouse reports to see surgeon to have desmoid tumor removed from abdomen. She vomited x 2 after discharge. Here for pain control and intermittent nausea.

## 2016-03-14 NOTE — ED Notes (Signed)
Pt to bathroom to obtain urine at this time.  Sprite given to pt. For po challenge

## 2016-03-14 NOTE — Discharge Summary (Signed)
Physician Discharge Summary  [name] [id number] DOB: November 13, 1970 DOA: 03/13/2016  PCP: No PCP Per Patient  Admit date: 03/13/2016 Discharge date: 03/14/2016  Time spent: 45* minutes  Recommendations for Outpatient Follow-up:  1. Follow up General surgery in 3 weeks   Discharge Diagnoses:  Principal Problem:   Desmoid tumor of abdomen Active Problems:   Hypothyroidism   Overweight (BMI 25.0-29.9)   HYPERTENSION, BENIGN SYSTEMIC   Asthma   Abdominal pain   Hypokalemia   Discharge Condition: Stable  Diet recommendation: Regular diet  Filed Weights   03/12/16 2135 03/13/16 0534  Weight: 73.619 kg (162 lb 4.8 oz) 73.619 kg (162 lb 4.8 oz)    History of present illness:  45 y.o. female with medical history significant of hypertension, hyperlipidemia, asthma, hypothyroidism, CVA, ovarian cysts, obesity, who presents with abdominal pain. Patient reports that she has been having abdominal pain for 5 days. The abdominal pain is located in the left middle quadrant, constant, 10 out of 10 in severity, radiating to the left flank area. It is associated with nausea, but no vomiting, diarrhea. Patient has mild cough due to asthma, which is at baseline. No shortness of breath, chest pain, fever or chills. Patient does not have symptoms of UTI or unilateral weakness.  Hospital Course:   Desmoid tumor of abdomen: Patient's abdominal pain is most likely due to rapidly growing desmoid tumor. General surgery was consulted.CT scan also showed mild gastritis. General surgery recomends to follow up as outpatient. No surgery for now. Pain control with vicodin prn   Hypothyroidism: Last TSH was 1.80 on 06/07/14 -Continue home Synthroid  HTN: Blood pressure 107/64. - will cut down the dose of Amlodipine to 5 mg po daily  Hypokalemia: K= 3.3 on admission. Replete  Asthma: stable. No wheezing or rhonchi on lung auscultation. -Continue Singulair  HLD: Last LDL was 155 on 11/12/13.  Patient used to take lovastatin, but currently patient is not taking medications at home -Follow-up with PCP  Procedures:  None   Consultations:  Surgery  Discharge Exam: Filed Vitals:   03/14/16 0503 03/14/16 0948  BP: 116/66 112/72  Pulse: 70   Temp: 98.1 F (36.7 C)   Resp: 18     General: Appears in no acute distress Cardiovascular: S1S2 RRR Respiratory: Clear bilaterally  Discharge Instructions   Discharge Instructions    Diet - low sodium heart healthy    Complete by:  As directed      Increase activity slowly    Complete by:  As directed           Current Discharge Medication List    START taking these medications   Details  famotidine (PEPCID) 20 MG tablet Take 1 tablet (20 mg total) by mouth 2 (two) times daily. Qty: 60 tablet, Refills: 2      CONTINUE these medications which have CHANGED   Details  amLODipine (NORVASC) 5 MG tablet Take 1 tablet (5 mg total) by mouth daily. Qty: 30 tablet, Refills: 2    HYDROcodone-acetaminophen (NORCO) 5-325 MG tablet Take 1 tablet by mouth every 6 (six) hours as needed for moderate pain. Qty: 30 tablet, Refills: 0      CONTINUE these medications which have NOT CHANGED   Details  levothyroxine (SYNTHROID, LEVOTHROID) 200 MCG tablet Take one tablet daily **MUST HAVE PHYSICAL FOR FURTHER REFILLS** Qty: 30 tablet, Refills: 0    lisinopril-hydrochlorothiazide (PRINZIDE,ZESTORETIC) 20-25 MG tablet Take 1 tablet by mouth daily.    metFORMIN (GLUCOPHAGE-XR) 500 MG 24  hr tablet Take 500 mg by mouth daily with breakfast.      STOP taking these medications     montelukast (SINGULAIR) 10 MG tablet        No Known Allergies Follow-up Information    Follow up with [facility] E, MD. Schedule an appointment as soon as possible for a visit in 3 weeks.   Specialty:  General Surgery   Contact information:   [address] [place] [address] [phone]        The results of significant  diagnostics from this hospitalization (including imaging, microbiology, ancillary and laboratory) are listed below for reference.    Significant Diagnostic Studies: Ct Abdomen Pelvis W Contrast  03/13/2016  CLINICAL DATA:  LEFT flank pain, nausea for a few days. Epigastric pain radiating to stomach. EXAM: CT ABDOMEN AND PELVIS WITH CONTRAST TECHNIQUE: Multidetector CT imaging of the abdomen and pelvis was performed using the standard protocol following bolus administration of intravenous contrast. CONTRAST:  157mL ISOVUE-300 IOPAMIDOL (ISOVUE-300) INJECTION 61% COMPARISON:  CT abdomen and pelvis February 06, 2015 FINDINGS: LUNG BASES: Included view of the lung bases are clear. Visualized heart and pericardium are unremarkable. SOLID ORGANS: The liver is diffusely hypodense compatible with steatosis. Status postcholecystectomy. Spleen, pancreas and adrenal glands are unremarkable. GASTROINTESTINAL TRACT: Mild distal stomach/ antral wall thickening and edema. The small and large bowel are normal in course and caliber without inflammatory changes. Mild sigmoid diverticulosis. The appendix is not discretely identified, however there are no inflammatory changes in the right lower quadrant. KIDNEYS/ URINARY TRACT: Kidneys are orthotopic, demonstrating symmetric enhancement. No nephrolithiasis, hydronephrosis or solid renal masses. The unopacified ureters are normal in course and caliber. Delayed imaging through the kidneys demonstrates symmetric prompt contrast excretion within the proximal urinary collecting system. Urinary bladder is partially distended and unremarkable. PERITONEUM/RETROPERITONEUM: Aortoiliac vessels are normal in course and caliber. No lymphadenopathy by CT size criteria. Status post hysterectomy. 2.5 x 4.7 cm benign-appearing homogeneously hypodense RIGHT adnexal cyst, no indicated follow-up by imaging criteria. Small amount of free fluid in the pelvis is likely physiologic. SOFT TISSUE/OSSEOUS  STRUCTURES: Enlarging 2.3 cm soft tissue mass within the LEFT anterior abdominal wall subcutaneous fat, previously 16 mm. Caesarean section scar. Gluteal injection granulomas. Mild sacroiliac osteoarthrosis. IMPRESSION: Mild gastritis without complication. 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 Electronically Signed   By: Elon Alas M.D.   On: 03/13/2016 02:50    Microbiology: No results found for this or any previous visit (from the past 240 hour(s)).   Labs: Basic Metabolic Panel:  Recent Labs Lab 03/12/16 2205 03/13/16 0533 03/14/16 0708  NA 136 136 139  K 3.4* 3.3* 4.7  CL 103 104 108  CO2 24 25 26   GLUCOSE 116* 103* 95  BUN 22* 18 12  CREATININE 0.72 0.76 0.58  CALCIUM 9.3 8.6* 8.4*   Liver Function Tests:  Recent Labs Lab 03/12/16 2205 03/13/16 0533 03/14/16 0708  AST 22 50* 77*  ALT 33 43 127*  ALKPHOS 81 70 82  BILITOT 0.5 0.9 0.7  PROT 7.3 6.8 6.0*  ALBUMIN 3.9 3.4* 3.0*    Recent Labs Lab 03/12/16 2205 03/13/16 0533 03/14/16 0708  LIPASE 37 40 27   CBC:  Recent Labs Lab 03/12/16 2205 03/13/16 0533 03/14/16 0708  WBC 8.6 8.3 5.6  HGB 13.7 13.1 12.5  HCT 40.5 39.4 38.3  MCV 88.6 88.3 88.9  PLT 163 149* 143*    CBG:  Recent Labs Lab 03/13/16 0742 03/14/16 0751  GLUCAP 100* 93     Signed:  Sindia Kowalczyk S MD.  [facility] 03/14/2016, 11:06 AM

## 2016-03-14 NOTE — Progress Notes (Signed)
Interpreter Lesle Chris for patients wants to ask her doctor aboutt her condition

## 2016-03-14 NOTE — ED Provider Notes (Signed)
CSN: [id number]     Arrival date & time 03/14/16  1656 History   First MD Initiated Contact with Patient 03/14/16 2130     Chief Complaint  Patient presents with  . Abdominal Pain  . Emesis     (Consider location/radiation/quality/duration/timing/severity/associated sxs/prior Treatment) HPI 45 year old female who presents with nausea, vomiting, abdominal pain and diarrhea. She has a history of hypertension, ovarian cysts, appendectomy, cholecystectomy, cesarean section, and laparoscopic abdominal hysterectomy. 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 She was admitted for symptomatic control, seen by Dr. Brantley Stage from general surgery, who recommended elective surgery is needed. Her CT also shows some evidence of gastritis which she was treated for. She states that she was feeling well upon discharge this morning, but at home this afternoon and evening she has had recurrent nausea, vomiting, and diarrhea with worsening abdominal pain. She was not discharged with antibiotics and states that she was unable to take any of her pain medications. No fevers, chills, syncope or near syncope, chest pain, shortness of breath, dysuria or urinary frequency. Past Medical History  Diagnosis Date  . Hyperthyroidism 10/2000    RAI treatment,  multinodular goiter  . Ectopic pregnancy 04/2006    treated with medication  . Hepatic steatosis 07/2006, 04/2014    on Abd CT done for pain  . Preterm delivery 12/2010    x3, placental abruption, maternal htn, DM  . Hypertension   . Anemia   . ABORTION, SPONTANEOUS 08/18/2007  . IMPAIRED GLUCOSE TOLERANCE 08/18/2007  . Acquired hypothyroidism   . Asthma   . Ovarian cyst 04/2014  . CVA (cerebral vascular accident) ([place]) 2008    Left globus pallidus, no residual  . Type II diabetes mellitus ([place]) dx'd 12/2015  . Migraine     "had them often; many years" (03/13/2016)    Past Surgical History  Procedure Laterality Date  . Appendectomy  1997  . Cholecystectomy  1997  . Ovarian cyst removal  2012  . Laparoscopic lysis intestinal adhesions  2013  . Thyroid surgery  ~ 2000    goiter removed  . Cesarean section  2012  . Laparoscopic hysterectomy  04/24/2012    Procedure: HYSTERECTOMY TOTAL LAPAROSCOPIC;  Surgeon: Terrance Mass, MD;  Location: [facility];  Service: Gynecology;  Laterality: N/A;  2 1/2 hours OR time.  Dr. Phineas Real to assisting   . Abdominal hysterectomy  04/24/2012    Procedure: HYSTERECTOMY ABDOMINAL;  Surgeon: Terrance Mass, MD;  Location: [facility];  Service: Gynecology;;  . Esophagogastroduodenoscopy  10/2014    WNL  . Colonoscopy  10/2014    1 hyperplastic polyp, rpt 10 yrs Henrene Pastor)   Family History  Problem Relation Age of Onset  . Hypertension Father   . Stroke Father     hemorrhagic, deceased  . Diabetes Mother   . Uterine cancer Mother   . Coronary artery disease Neg Hx   . Cancer Neg Hx    Social History  Substance Use Topics  . Smoking status: Never Smoker   . Smokeless tobacco: Never Used  . Alcohol Use: No   OB History    Gravida Para Term Preterm AB TAB SAB Ectopic Multiple Living   5 1  1 3  3   1      Review of Systems 10/14 systems reviewed and are negative other than those stated in the HPI   Allergies  Review of patient's allergies indicates no known allergies.  Home Medications   Prior to Admission medications   Medication Sig Start Date End Date Taking? Authorizing Provider  amLODipine (NORVASC) 5 MG tablet Take 1 tablet (5 mg total) by mouth daily. 03/14/16  Yes Oswald Hillock, MD  famotidine (PEPCID) 20 MG tablet Take 1 tablet (20 mg total) by mouth 2 (two) times daily. 03/14/16  Yes Oswald Hillock, MD  HYDROcodone-acetaminophen (NORCO) 5-325 MG tablet Take 1 tablet by mouth every 6 (six) hours as needed for moderate pain. 03/14/16  Yes Oswald Hillock, MD  levothyroxine (SYNTHROID, LEVOTHROID) 200 MCG tablet  Take one tablet daily **MUST HAVE PHYSICAL FOR FURTHER REFILLS** 09/13/15  Yes Ria Bush, MD  lisinopril-hydrochlorothiazide (PRINZIDE,ZESTORETIC) 20-25 MG tablet Take 1 tablet by mouth daily.   Yes Historical Provider, MD  metFORMIN (GLUCOPHAGE-XR) 500 MG 24 hr tablet Take 500 mg by mouth daily with breakfast.   Yes Historical Provider, MD  ondansetron (ZOFRAN) 4 MG tablet Take 1 tablet (4 mg total) by mouth every 6 (six) hours as needed for nausea or vomiting. 03/15/16   Forde Dandy, MD   BP 129/85 mmHg  Pulse 79  Temp(Src) 98.7 F (37.1 C) (Oral)  Resp 16  SpO2 99%  LMP 11/12/2011 Physical Exam Physical Exam  Nursing note and vitals reviewed. Constitutional: Well developed, well nourished, non-toxic, and in no acute distress Head: Normocephalic and atraumatic.  Mouth/Throat: Oropharynx is clear and moist.  Neck: Normal range of motion. Neck supple.  Cardiovascular: Normal rate and regular rhythm.   Pulmonary/Chest: Effort normal and breath sounds normal.  Abdominal: Soft. There is LLQ tenderness. There is no rebound and no guarding.  Musculoskeletal: Normal range of motion.  Neurological: Alert, no facial droop, fluent speech, moves all extremities symmetrically Skin: Skin is warm and dry.  Psychiatric: Cooperative  ED Course  Procedures (including critical care time) Labs Review Labs Reviewed  CBC WITH DIFFERENTIAL/PLATELET - Abnormal; Notable for the following:    WBC 11.0 (*)    Neutro Abs 9.4 (*)    All other components within normal limits  BASIC METABOLIC PANEL - Abnormal; Notable for the following:    Glucose, Bld 114 (*)    All other components within normal limits  URINALYSIS, ROUTINE W REFLEX MICROSCOPIC (NOT AT [facility]) - Abnormal; Notable for the following:    APPearance CLOUDY (*)    Ketones, ur 15 (*)    All other components within normal limits  HCG, SERUM, QUALITATIVE    Imaging Review Ct Abdomen Pelvis W Contrast  03/13/2016  CLINICAL DATA:  LEFT  flank pain, nausea for a few days. Epigastric pain radiating to stomach. EXAM: CT ABDOMEN AND PELVIS WITH CONTRAST TECHNIQUE: Multidetector CT imaging of the abdomen and pelvis was performed using the standard protocol following bolus administration of intravenous contrast. CONTRAST:  151mL ISOVUE-300 IOPAMIDOL (ISOVUE-300) INJECTION 61% COMPARISON:  CT abdomen and pelvis February 06, 2015 FINDINGS: LUNG BASES: Included view of the lung bases are clear. Visualized heart and pericardium are unremarkable. SOLID ORGANS: The liver is diffusely hypodense compatible with steatosis. Status postcholecystectomy. Spleen, pancreas and adrenal glands are unremarkable. GASTROINTESTINAL TRACT: Mild distal stomach/ antral wall thickening and edema. The small and large bowel are normal in course and caliber without inflammatory changes. Mild sigmoid diverticulosis. The appendix is not discretely identified, however there are no inflammatory changes in the right lower quadrant. KIDNEYS/ URINARY TRACT: Kidneys are orthotopic, demonstrating symmetric enhancement. No nephrolithiasis, hydronephrosis or solid renal  masses. The unopacified ureters are normal in course and caliber. Delayed imaging through the kidneys demonstrates symmetric prompt contrast excretion within the proximal urinary collecting system. Urinary bladder is partially distended and unremarkable. PERITONEUM/RETROPERITONEUM: Aortoiliac vessels are normal in course and caliber. No lymphadenopathy by CT size criteria. Status post hysterectomy. 2.5 x 4.7 cm benign-appearing homogeneously hypodense RIGHT adnexal cyst, no indicated follow-up by imaging criteria. Small amount of free fluid in the pelvis is likely physiologic. SOFT TISSUE/OSSEOUS STRUCTURES: Enlarging 2.3 cm soft tissue mass within the LEFT anterior abdominal wall subcutaneous fat, previously 16 mm. Caesarean section scar. Gluteal injection granulomas. Mild sacroiliac osteoarthrosis. IMPRESSION: Mild gastritis  without complication. 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 Electronically Signed   By: Elon Alas M.D.   On: 03/13/2016 02:50   I have personally reviewed and evaluated these images and lab results as part of my medical decision-making.   EKG Interpretation None      MDM   Final diagnoses:  Abdominal wall mass  Nausea vomiting and diarrhea   45 year old female with pelvic wall mass w/ presents with persistent N/V/D. Well appearing, well hydrated, with normal vital signs. Abdomen benign with LLQ tenderness, where her known pelvic wall mass is located. Do not think she requires repeat CT imaging at this time. CBC, BMP overall unremarkable. UA w/ small ketones but otherwise unremarkable. Given IVF, antiemetics, analgesics with improved symptoms. Tolerates PO challenge. Still appropriate for outpatient management. Given prescription for zofran for home. She has home pepcid and hydrocodone that she will hopefully be able to tolerate now that she has home antiemetics. Will continue home supportive care. Given referral for general surgery f/u regarding pelvic mass. Strict return and follow-up instructions reviewed. She expressed understanding of all discharge instructions and felt comfortable with the plan of care.    Forde Dandy, MD 03/15/16 [phone]

## 2016-03-15 LAB — URINALYSIS, ROUTINE W REFLEX MICROSCOPIC
Bilirubin Urine: NEGATIVE
Glucose, UA: NEGATIVE mg/dL
HGB URINE DIPSTICK: NEGATIVE
Ketones, ur: 15 mg/dL — AB
LEUKOCYTES UA: NEGATIVE
NITRITE: NEGATIVE
PROTEIN: NEGATIVE mg/dL
SPECIFIC GRAVITY, URINE: 1.014 (ref 1.005–1.030)
pH: 6.5 (ref 5.0–8.0)

## 2016-03-15 MED ORDER — ONDANSETRON HCL 4 MG PO TABS: 4.0000 mg | ORAL_TABLET | Freq: Four times a day (QID) | ORAL | Status: DC | PRN

## 2016-03-15 NOTE — Discharge Instructions (Signed)
YOu are given general surgery follow-up as needed for the abdominal wall mass. Take medications as prescribed. Return without fail for worsening symptoms, including intractable vomiting, escalating pain, fever, or any other symptoms concerning to you.  Nuseas y vmitos (Nausea and Vomiting)  Naseas significa que tiene ganas de vomitar. Elvmitoes un reflejo por el que los contenidos del estmago salen por la boca.  CUIDADOS EN EL HOGAR  Tome los medicamentos como le indic el mdico.  No se esfuerce por comer. Sin embargo, es necesario que tome lquidos.  Si tiene ganas de comer, [place] dieta normal, segn las indicaciones del mdico.  Coma arroz, trigo, patatas, pan, carnes magras, yogur, frutas y vegetales.  Evite los alimentos [employer].  Beba gran cantidad de lquidos para mantener la orina de tono claro o color amarillo plido.  Consulte a su mdico como reponer la prdida de lquidos (rehidratacin). Los signos de prdida de lquidos (deshidratacin) son:  Catering manager sed.  Tiene los labios y la boca secos.  Est mareado.  La orina es [place].  Orina menos que lo normal.  Se siente confundido.  La respiracin o la frecuencia cardaca son rpidas. SOLICITE AYUDA DE INMEDIATO SI:  Observa sangre en su vmito.  La materia fecal (heces)es negra o de color rojo.  Siente un dolor de cabeza muy intenso o el cuello rgido.  Se siente confundido.  Siente dolor muy fuerte en el vientre (abdominal).  Tiene dolor en el pecho o dificultad para respirar.  No ha orinado durante 8 horas.  Tiene la piel fra y pegajosa.  Sigue vomitando despus de 24  48 horas.  Tiene fiebre. ASEGRESE QUE:  Comprende estas instrucciones.  Controlar la enfermedad.  Puede solicitar ayuda de inmediato si los sntomas no mejoran, o si empeoran.   Esta informacin no tiene Marine scientist el consejo del mdico. Asegrese de hacerle al mdico cualquier pregunta que  tenga.   Document Released: 02/07/2010 Document Revised: 11/12/2011 Elsevier Interactive Patient Education 2016 [place]  (Diarrhea) La diarrea es la materia fecal (heces) acuosas. Puede hacerlo sentir dbil, cansado, sediento, o con la boca seca (signos de deshidratacin). La materia fecal acuosa es signo de otro problema, generalmente una infeccin. Suele durar 2 o 3 das. Puede durar ms tiempo si es el signo de una enfermedad grave. Cudese segn lo que le indique su mdico.  CUIDADOS EN EL HOGAR   Beba 1 taza (8 onzas) de lquido cada vez que tenga una deposicin acuosa.  No beba los siguientes lquidos:  Los que contengan azcares simples (fructosa, glucosa, galactosa, Graf, sacarosa, maltosa).  Bebidas deportivas.  Jugos de fruta.  Productos lcteos enteros.  Gaseosas.  Bebidas con cafena (caf, t, gaseosas) o alcohol.  Puede usar soluciones de rehidratacin oral si su mdico lo autoriza. Usted mismo puede preparar la solucin. Siga esta receta:   -  cucharadita de sal.   cucharadita de bicarbonato de soda.   de cucharadita de sal sustituta que contenga cloruro de potasio.  1  cucharada de azcar.  1 litros (34 oz) de agua.  Evite los siguientes alimentos:  Los que tienen gran contenido de [place], como frutas y verduras.  Frutas secas, semillas y panes y cereales integrales.   Las endulzadas con alcohol de azcar (xylitol, sorbitol, manitol).  Trate de comer los siguientes alimentos:  Los que tienen almidn, como arroz, pan, pasta, cereales bajos en azcar, avena, smola de maz, papas al horno, galletas y panecillos.  Bananas.  Pur de [employer].  Consuma alimentos ricos en probiticos, como yogur y productos lcteos fermentados.  Lvese bien las manos cada vez que tenga una deposicin acuosa.  Slo tome los medicamentos que le haya indicado su mdico.  Tome un bao caliente para ayudar a Transport planner ardor o dolor que producen las  deposiciones aguadas. SOLICITE AYUDA DE INMEDIATO SI:   No puede beber lquidos sin devolver vomitar.  Sigue vomitando.  Tiene sangre en la materia fecal, o es de color negro y de aspecto alquitranado.  No hay emisin de [place] durante 6 a 8 horas o elimina una pequea cantidad de [place].  Usted tiene dolor de estmago (abdominal) que empeora o se mantiene en el mismo lugar (localiza).  Se siente dbil, mareado, confundido o se desmaya.  Siente un dolor de cabeza muy intenso.  La materia fecal acuosa no mejora.  Tiene fiebre o sntomas que persisten durante ms de 2-3 das.  Tiene fiebre y los sntomas empeoran de manera sbita. ASEGRESE DE QUE:   Comprende estas instrucciones.  Controlar su enfermedad.  Solicitar ayuda de inmediato si no mejora o si empeora.   Esta informacin no tiene Marine scientist el consejo del mdico. Asegrese de hacerle al mdico cualquier pregunta que tenga.   Document Released: 08/06/2012 Document Revised: 09/10/2014 Elsevier Interactive Patient Education [employer].

## 2016-04-05 ENCOUNTER — Encounter (HOSPITAL_COMMUNITY): Payer: Self-pay | Admitting: Emergency Medicine

## 2016-04-05 DIAGNOSIS — E039 Hypothyroidism, unspecified: Secondary | ICD-10-CM | POA: Insufficient documentation

## 2016-04-05 DIAGNOSIS — J45909 Unspecified asthma, uncomplicated: Secondary | ICD-10-CM | POA: Insufficient documentation

## 2016-04-05 DIAGNOSIS — Z79899 Other long term (current) drug therapy: Secondary | ICD-10-CM | POA: Insufficient documentation

## 2016-04-05 DIAGNOSIS — R1032 Left lower quadrant pain: Secondary | ICD-10-CM | POA: Insufficient documentation

## 2016-04-05 DIAGNOSIS — Z8673 Personal history of transient ischemic attack (TIA), and cerebral infarction without residual deficits: Secondary | ICD-10-CM | POA: Insufficient documentation

## 2016-04-05 DIAGNOSIS — I10 Essential (primary) hypertension: Secondary | ICD-10-CM | POA: Insufficient documentation

## 2016-04-05 DIAGNOSIS — Z7984 Long term (current) use of oral hypoglycemic drugs: Secondary | ICD-10-CM | POA: Insufficient documentation

## 2016-04-05 DIAGNOSIS — E119 Type 2 diabetes mellitus without complications: Secondary | ICD-10-CM | POA: Insufficient documentation

## 2016-04-05 LAB — COMPREHENSIVE METABOLIC PANEL
ALBUMIN: 3.8 g/dL (ref 3.5–5.0)
ALT: 28 U/L (ref 14–54)
ANION GAP: 6 (ref 5–15)
AST: 18 U/L (ref 15–41)
Alkaline Phosphatase: 88 U/L (ref 38–126)
BUN: 18 mg/dL (ref 6–20)
CHLORIDE: 107 mmol/L (ref 101–111)
CO2: 23 mmol/L (ref 22–32)
Calcium: 8.7 mg/dL — ABNORMAL LOW (ref 8.9–10.3)
Creatinine, Ser: 0.58 mg/dL (ref 0.44–1.00)
GFR calc Af Amer: >60 mL/min (ref >60)
GFR calc non Af Amer: >60 mL/min (ref >60)
GLUCOSE: 125 mg/dL — AB (ref 65–99)
POTASSIUM: 3.6 mmol/L (ref 3.5–5.1)
SODIUM: 136 mmol/L (ref 135–145)
TOTAL PROTEIN: 6.9 g/dL (ref 6.5–8.1)
Total Bilirubin: 0.2 mg/dL — ABNORMAL LOW (ref 0.3–1.2)

## 2016-04-05 LAB — CBC
HEMATOCRIT: 39.2 % (ref 36.0–46.0)
HEMOGLOBIN: 13 g/dL (ref 12.0–15.0)
MCH: 29.5 pg (ref 26.0–34.0)
MCHC: 33.2 g/dL (ref 30.0–36.0)
MCV: 89.1 fL (ref 78.0–100.0)
Platelets: 136 10*3/uL — ABNORMAL LOW (ref 150–400)
RBC: 4.4 MIL/uL (ref 3.87–5.11)
RDW: 12.6 % (ref 11.5–15.5)
WBC: 6.4 10*3/uL (ref 4.0–10.5)

## 2016-04-05 LAB — URINALYSIS, ROUTINE W REFLEX MICROSCOPIC
BILIRUBIN URINE: NEGATIVE
GLUCOSE, UA: NEGATIVE mg/dL
Hgb urine dipstick: NEGATIVE
Ketones, ur: NEGATIVE mg/dL
Leukocytes, UA: NEGATIVE
NITRITE: NEGATIVE
PH: 7.5 (ref 5.0–8.0)
Protein, ur: NEGATIVE mg/dL
SPECIFIC GRAVITY, URINE: 1.026 (ref 1.005–1.030)

## 2016-04-05 LAB — POC URINE PREG, ED: Preg Test, Ur: NEGATIVE

## 2016-04-05 LAB — LIPASE, BLOOD: LIPASE: 40 U/L (ref 11–51)

## 2016-04-05 NOTE — ED Triage Notes (Signed)
The patient said she is having abdominal pain that started three days ago.  Today it has gotten worse and she cannot take the pain.  She rates her pain 10/10.  She adivsed me she has a "tumor" in her abdomen but cannot say what she was diagnosed with.  She took tylemol for it and it is not working.

## 2016-04-06 ENCOUNTER — Emergency Department (HOSPITAL_COMMUNITY)
Admission: EM | Admit: 2016-04-06 | Discharge: 2016-04-06 | Disposition: A | Discharge status: Home / Self Care | Payer: Self-pay | Attending: Emergency Medicine | Admitting: Emergency Medicine

## 2016-04-06 ENCOUNTER — Emergency Department (HOSPITAL_COMMUNITY): Payer: Self-pay

## 2016-04-06 DIAGNOSIS — R1032 Left lower quadrant pain: Secondary | ICD-10-CM

## 2016-04-06 MED ORDER — IOPAMIDOL (ISOVUE-300) INJECTION 61%
INTRAVENOUS | Status: AC
  Administered 2016-04-06: 100 mL
  Filled 2016-04-06: qty 100

## 2016-04-06 MED ORDER — OXYCODONE-ACETAMINOPHEN 5-325 MG PO TABS: 1.0000 | ORAL_TABLET | ORAL | 0 refills | Status: DC | PRN

## 2016-04-06 MED ORDER — PROMETHAZINE HCL 25 MG RE SUPP: 25.0000 mg | Freq: Four times a day (QID) | RECTAL | 0 refills | Status: DC | PRN

## 2016-04-06 MED ORDER — HYDROMORPHONE HCL 1 MG/ML IJ SOLN
1.0000 mg | Freq: Once | INTRAMUSCULAR | Status: AC
  Administered 2016-04-06: 1 mg via INTRAVENOUS
  Filled 2016-04-06: qty 1

## 2016-04-06 MED ORDER — SODIUM CHLORIDE 0.9 % IV SOLN
1000.0000 mL | Freq: Once | INTRAVENOUS | Status: AC
  Administered 2016-04-06: 1000 mL via INTRAVENOUS

## 2016-04-06 MED ORDER — ONDANSETRON HCL 4 MG/2ML IJ SOLN
4.0000 mg | Freq: Once | INTRAMUSCULAR | Status: AC
  Administered 2016-04-06: 4 mg via INTRAVENOUS
  Filled 2016-04-06: qty 2

## 2016-04-06 MED ORDER — SODIUM CHLORIDE 0.9 % IV SOLN
1000.0000 mL | INTRAVENOUS | Status: DC
  Administered 2016-04-06: 1000 mL via INTRAVENOUS

## 2016-04-06 NOTE — ED Provider Notes (Signed)
[facility] DEPT Provider Note   CSN: [id number] Arrival date & time: 04/05/16  2216  First Provider Contact:  First MD Initiated Contact with Patient 04/06/16 0150   By signing my name below, I, Dyke Brackett, attest that this documentation has been prepared under the direction and in the presence of Jola Schmidt, MD . Electronically Signed: Dyke Brackett, Scribe. 04/06/2016. 2:28 AM.  History   Chief Complaint Chief Complaint  Patient presents with  . Abdominal Pain    The patient said she is having abdominal pain that started three days ago.  Today it has gotten worse and she cannot take the pain.  She rates her pain 10/10.  She adivsed me she has a "tumor" in her abdomen but cannot say what she was diagnosed with.    HPI [name] is a 45 y.o. female who presents to the Emergency Department complaining of intermittent, worsening, severe abdominal pain onset 3 days ago. Pt has taken hydrocodone with no relief. Pt was seen recently hospitalized for the same pain. A CT scan showed pelvic wall desmoid tumor. Pt was seen by general surgery at that time who recommended outpatient surgery. She also complains of associated nausea, but denies vomiting, diarrhea, hematochezia, or fever.  The history is provided by the patient. No language interpreter was used.    Past Medical History:  Diagnosis Date  . ABORTION, SPONTANEOUS 08/18/2007  . Acquired hypothyroidism   . Anemia   . Asthma   . CVA (cerebral vascular accident) ([place]) 2008   Left globus pallidus, no residual  . Ectopic pregnancy 04/2006   treated with medication  . Hepatic steatosis 07/2006, 04/2014   on Abd CT done for pain  . Hypertension   . Hyperthyroidism 10/2000   RAI treatment,  multinodular goiter  . IMPAIRED GLUCOSE TOLERANCE 08/18/2007  . Migraine    "had them often; many years" (03/13/2016)  . Ovarian cyst 04/2014  . Preterm delivery 12/2010   x3, placental abruption, maternal htn, DM  . Type II diabetes  mellitus ([place]) dx'd 12/2015    Patient Active Problem List   Diagnosis Date Noted  . Abdominal pain 03/13/2016  . Hypokalemia 03/13/2016  . Desmoid tumor of abdomen   . Pain of left scapula 11/12/2013  . Numbness and tingling in right hand 06/17/2012  . Female pelvic pain 05/07/2012  . Anemia 04/03/2011  . MIGRAINE HEADACHE 05/12/2007  . OCCLUSION, CEREBRAL ARTERY NOS W/INFARCTION 12/12/2006  . Skin rash 12/11/2006  . Hypothyroidism 10/31/2006  . HLD (hyperlipidemia) 10/31/2006  . Overweight (BMI 25.0-29.9) 10/31/2006  . HYPERTENSION, BENIGN SYSTEMIC 10/31/2006  . Asthma 10/31/2006  . UMBILICAL HERNIA [id number]  . MENSTRUAL CYCLE, IRREGULAR 10/31/2006    Past Surgical History:  Procedure Laterality Date  . ABDOMINAL HYSTERECTOMY  04/24/2012   Procedure: HYSTERECTOMY ABDOMINAL;  Surgeon: Terrance Mass, MD;  Location: [facility];  Service: Gynecology;;  . [place]  . CESAREAN SECTION  2012  . CHOLECYSTECTOMY  1997  . COLONOSCOPY  10/2014   1 hyperplastic polyp, rpt 10 yrs Henrene Pastor)  . ESOPHAGOGASTRODUODENOSCOPY  10/2014   WNL  . LAPAROSCOPIC HYSTERECTOMY  04/24/2012   Procedure: HYSTERECTOMY TOTAL LAPAROSCOPIC;  Surgeon: Terrance Mass, MD;  Location: [facility];  Service: Gynecology;  Laterality: N/A;  2 1/2 hours OR time.  Dr. Phineas Real to assisting   . LAPAROSCOPIC LYSIS INTESTINAL ADHESIONS  2013  . OVARIAN CYST REMOVAL  2012  . THYROID SURGERY  ~ 2000   goiter removed  OB History    Gravida Para Term Preterm AB Living   5 1   1 3 1    SAB TAB Ectopic Multiple Live Births   3               Home Medications    Prior to Admission medications   Medication Sig Start Date End Date Taking? Authorizing Provider  amLODipine (NORVASC) 5 MG tablet Take 1 tablet (5 mg total) by mouth daily. 03/14/16   Oswald Hillock, MD  famotidine (PEPCID) 20 MG tablet Take 1 tablet (20 mg total) by mouth 2 (two) times daily. 03/14/16   Oswald Hillock, MD  HYDROcodone-acetaminophen  (NORCO) 5-325 MG tablet Take 1 tablet by mouth every 6 (six) hours as needed for moderate pain. 03/14/16   Oswald Hillock, MD  levothyroxine (SYNTHROID, LEVOTHROID) 200 MCG tablet Take one tablet daily **MUST HAVE PHYSICAL FOR FURTHER REFILLS** 09/13/15   Ria Bush, MD  lisinopril-hydrochlorothiazide (PRINZIDE,ZESTORETIC) 20-25 MG tablet Take 1 tablet by mouth daily.    Historical Provider, MD  metFORMIN (GLUCOPHAGE-XR) 500 MG 24 hr tablet Take 500 mg by mouth daily with breakfast.    Historical Provider, MD  ondansetron (ZOFRAN) 4 MG tablet Take 1 tablet (4 mg total) by mouth every 6 (six) hours as needed for nausea or vomiting. 03/15/16   Forde Dandy, MD    Family History Family History  Problem Relation Age of Onset  . Hypertension Father   . Stroke Father     hemorrhagic, deceased  . Diabetes Mother   . Uterine cancer Mother   . Coronary artery disease Neg Hx   . Cancer Neg Hx     Social History Social History  Substance Use Topics  . Smoking status: Never Smoker  . Smokeless tobacco: Never Used  . Alcohol use No     Allergies   Review of patient's allergies indicates no known allergies.   Review of Systems Review of Systems 10 Systems reviewed and are negative for acute change except as noted in the HPI.  Physical Exam Updated Vital Signs BP 173/94 (BP Location: Right Arm)   Pulse 90   Temp 98 F (36.7 C) (Oral)   Resp 18   LMP 11/12/2011   SpO2 99%   Physical Exam  Constitutional: She is oriented to person, place, and time. She appears well-developed and well-nourished. No distress.  HENT:  Head: Normocephalic and atraumatic.  Eyes: EOM are normal.  Neck: Normal range of motion.  Cardiovascular: Normal rate, regular rhythm and normal heart sounds.   Pulmonary/Chest: Effort normal and breath sounds normal.  Abdominal: Soft. She exhibits no distension. There is tenderness.  Left sided abdominal tenderness  Musculoskeletal: Normal range of motion.    Neurological: She is alert and oriented to person, place, and time.  Skin: Skin is warm and dry.  Psychiatric: She has a normal mood and affect. Judgment normal.  Nursing note and vitals reviewed.   ED Treatments / Results  DIAGNOSTIC STUDIES:  Oxygen Saturation is 99% on RA, normal by my interpretation.    COORDINATION OF CARE:  2:12 AM Discussed treatment plan which includes dilaudid, Zofran, and CT abdomenwith pt at bedside and pt agreed to plan.  Labs (all labs ordered are listed, but only abnormal results are displayed) Labs Reviewed  COMPREHENSIVE METABOLIC PANEL - Abnormal; Notable for the following:       Result Value   Glucose, Bld 125 (*)    Calcium 8.7 (*)  Total Bilirubin 0.2 (*)    All other components within normal limits  CBC - Abnormal; Notable for the following:    Platelets 136 (*)    All other components within normal limits  LIPASE, BLOOD  URINALYSIS, ROUTINE W REFLEX MICROSCOPIC (NOT AT [facility])  POC URINE PREG, ED    EKG  EKG Interpretation None       Radiology No results found.  Procedures Procedures (including critical care time)  Medications Ordered in ED Medications - No data to display   Initial Impression / Assessment and Plan / ED Course  I have reviewed the triage vital signs and the nursing notes.  Pertinent labs & imaging results that were available during my care of the patient were reviewed by me and considered in my medical decision making (see chart for details).  Clinical Course    6:21 AM Patient feels much better at this time.  No pathology noted to explain her left-sided abdominal pain.  She does have a increasing in size right adnexal complex mass.  She's having no focal pain there.  I do not think this is related to her presentation today.  She has a gynecologist.  I explained to her the findings and her need for an outpatient ultrasound to further evaluate this right ovarian/adnexal pathology.  She's schedule see  general surgery for follow-up.  I'll change her hydrocodone to oxycodone.   Final Clinical Impressions(s) / ED Diagnoses   Final diagnoses:  Left lower quadrant pain    I personally performed the services described in this documentation, which was scribed in my presence. The recorded information has been reviewed and is accurate.     New Prescriptions New Prescriptions   OXYCODONE-ACETAMINOPHEN (PERCOCET/ROXICET) 5-325 MG TABLET    Take 1 tablet by mouth every 4 (four) hours as needed for severe pain.   PROMETHAZINE (PHENERGAN) 25 MG SUPPOSITORY    Place 1 suppository (25 mg total) rectally every 6 (six) hours as needed for nausea or vomiting.     Jola Schmidt, MD 04/06/16 [phone]

## 2016-04-13 ENCOUNTER — Telehealth: Payer: Self-pay | Admitting: *Deleted

## 2016-04-13 NOTE — Telephone Encounter (Signed)
Patient's husband showed up at my house last night asking that you call patient. She used to see you, but you are no longer her PCP. She has apparently been diagnosed with some sort of tumor and wants to talk to you about it in Spanish because she doesn't understand the diagnosis. The best number to reach her is [phone]. I advised that you may not be able to call due to not being her PCP any longer and if you did, it would be later today. He verbalized understanding.

## 2016-04-18 ENCOUNTER — Other Ambulatory Visit: Payer: Self-pay | Admitting: *Deleted

## 2016-04-18 DIAGNOSIS — R102 Pelvic and perineal pain: Secondary | ICD-10-CM

## 2016-04-23 NOTE — Telephone Encounter (Signed)
Spoke with patient.

## 2016-05-08 ENCOUNTER — Ambulatory Visit (HOSPITAL_COMMUNITY)
Admission: RE | Admit: 2016-05-08 | Discharge: 2016-05-08 | Disposition: A | Discharge status: Home / Self Care | Payer: Self-pay | Source: Ambulatory Visit | Attending: Obstetrics & Gynecology | Admitting: Obstetrics & Gynecology

## 2016-05-08 DIAGNOSIS — N7011 Chronic salpingitis: Secondary | ICD-10-CM | POA: Insufficient documentation

## 2016-05-08 DIAGNOSIS — R102 Pelvic and perineal pain: Secondary | ICD-10-CM | POA: Insufficient documentation

## 2016-05-08 DIAGNOSIS — N83201 Unspecified ovarian cyst, right side: Secondary | ICD-10-CM | POA: Insufficient documentation

## 2016-05-08 DIAGNOSIS — R938 Abnormal findings on diagnostic imaging of other specified body structures: Secondary | ICD-10-CM | POA: Insufficient documentation

## 2016-05-09 ENCOUNTER — Telehealth: Payer: Self-pay | Admitting: General Practice

## 2016-05-09 NOTE — Telephone Encounter (Signed)
Per Dr Harolyn Rutherford, patient's ultrasound shows a possible endometrioma under her skin near her hysterectomy scar that could causing her pain. This will be discussed further at her next appt on 9/11. Called patient [place] interpreter [phone], no answer- unable to leave message due to no voicemail box set up.

## 2016-05-14 ENCOUNTER — Encounter: Payer: Self-pay | Admitting: Obstetrics & Gynecology

## 2016-05-14 ENCOUNTER — Ambulatory Visit (INDEPENDENT_AMBULATORY_CARE_PROVIDER_SITE_OTHER): Payer: Self-pay | Admitting: Obstetrics & Gynecology

## 2016-05-14 VITALS — BP 135/88 | HR 90 | Wt 161.7 lb

## 2016-05-14 DIAGNOSIS — D481 Neoplasm of uncertain behavior of connective and other soft tissue: Secondary | ICD-10-CM

## 2016-05-14 MED ORDER — NAPROXEN 500 MG PO TABS: 500.0000 mg | ORAL_TABLET | Freq: Two times a day (BID) | ORAL | 2 refills | Status: DC

## 2016-05-14 NOTE — Telephone Encounter (Signed)
Patient has appointment on 9/11 we will address her concerns at this time

## 2016-05-14 NOTE — Patient Instructions (Signed)
Regrese a la clinica cuando tenga su cita. Si tiene problemas o preguntas, llama a la clinica o vaya a la sala de emergencia al [facility].    

## 2016-05-14 NOTE — Progress Notes (Signed)
Video Interpreter # [id number]

## 2016-05-14 NOTE — Progress Notes (Signed)
GYNECOLOGY VISIT NOTE  History:  45 y.o. [id number] here today for discussion about worsening pain and known left lower quadrant subcutaneous abdominal wall mass, thought to be a cesarean section scar endometrioma vs desmoid tumor. Patient is Spanish-speaking only, Spanish interpreter present for this encounter. Was initially seen in ED on 03/13/16, had CT scan that showed this lesion, she was seen by General Surgery (Dr. Georganna Skeans) and the plan was expectant management. She was told to call back for worsening symptoms, as they will offer elective resection.  Today, she reports worsening constant pain that is debilitating.  She denies any abnormal vaginal discharge, bleeding, pelvic pain or other concerns.   Past Medical History:  Diagnosis Date  . ABORTION, SPONTANEOUS 08/18/2007  . Acquired hypothyroidism   . Anemia   . Asthma   . 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 Ectopic pregnancy 04/2006   treated with medication  . Hepatic steatosis 07/2006, 04/2014   on Abd CT done for pain  . Hypertension   . Hyperthyroidism 10/2000   RAI treatment,  multinodular goiter  . IMPAIRED GLUCOSE TOLERANCE 08/18/2007  . Migraine    "had them often; many years" (03/13/2016)  . Ovarian cyst 04/2014  . Preterm delivery 12/2010   x3, placental abruption, maternal htn, DM  . Type II diabetes mellitus ([place]) dx'd 12/2015    Past Surgical History:  Procedure Laterality Date  . ABDOMINAL HYSTERECTOMY  04/24/2012   Procedure: HYSTERECTOMY ABDOMINAL;  Surgeon: Terrance Mass, MD;  Location: [facility];  Service: Gynecology;;  . Regino Ramirez  . CESAREAN SECTION  2012  . CHOLECYSTECTOMY  1997  . COLONOSCOPY  10/2014   1 hyperplastic polyp, rpt 10 yrs Henrene Pastor)  . ESOPHAGOGASTRODUODENOSCOPY  10/2014   WNL  . LAPAROSCOPIC HYSTERECTOMY  04/24/2012   Procedure: HYSTERECTOMY TOTAL LAPAROSCOPIC;  Surgeon: Terrance Mass, MD;  Location: [facility];  Service: Gynecology;   Laterality: N/A;  2 1/2 hours OR time.  Dr. Phineas Real to assisting   . LAPAROSCOPIC LYSIS INTESTINAL ADHESIONS  2013  . OVARIAN CYST REMOVAL  2012  . THYROID SURGERY  ~ 2000   goiter removed    The following portions of the patient's history were reviewed and updated as appropriate: allergies, current medications, past family history, past medical history, past social history, past surgical history and problem list.    Review of Systems:  Pertinent items noted in HPI and remainder of comprehensive ROS otherwise negative.  Objective:  Physical Exam BP 135/88   Pulse 90   Wt 161 lb 11.2 oz (73.3 kg)   LMP 11/12/2011   BMI 26.91 kg/m  CONSTITUTIONAL: Well-developed, well-nourished female in no acute distress.  HENT:  Normocephalic, atraumatic. External right and left ear normal. Oropharynx is clear and moist EYES: Conjunctivae and EOM are normal. Pupils are equal, round, and reactive to light. No scleral icterus.  NECK: Normal range of motion, supple, no masses SKIN: Skin is warm and dry. No rash noted. Not diaphoretic. No erythema. No pallor. NEUROLOGIC: Alert and oriented to person, place, and time. Normal reflexes, muscle tone coordination. No cranial nerve deficit noted. PSYCHIATRIC: Normal mood and affect. Normal behavior. Normal judgment and thought content. CARDIOVASCULAR: Normal heart rate noted RESPIRATORY: Effort and breath sounds normal, no problems with respiration noted ABDOMEN: Soft, no distention noted.  2 cm tender subcutaneous mass in left lower quadrant under her cesarean section scar.  PELVIC: Deferred MUSCULOSKELETAL: Normal range of  motion. No edema noted.  Labs and Imaging US Transvaginal Non-ob  Result Date: 05/08/2016 CLINICAL DATA:  Left lower quadrant pain for 6 months. EXAM: TRANSABDOMINAL AND TRANSVAGINAL ULTRASOUND OF PELVIS TECHNIQUE: Both transabdominal and transvaginal ultrasound examinations of the pelvis were performed. Transabdominal technique was  performed for global imaging of the pelvis including uterus, ovaries, adnexal regions, and pelvic cul-de-sac. It was necessary to proceed with endovaginal exam following the transabdominal exam to visualize the . COMPARISON:  Ultrasound 08/18/2015.  CT 04/06/2016. FINDINGS: Uterus Measurements: Prior hysterectomy. Endometrium Thickness: N/A. Right ovary Measurements: 4.0 x 3.1 x 1.9 cm. Previously seen cyst within the right ovary is resolving, now less than 3 cm. No ovarian/adnexal mass. Left ovary Measurements: 2.9 x 1.7 x 1.4 cm. Normal appearance. There is a small tubular fluid-filled structure adjacent to the left ovary compatible with hydrosalpinx as seen on prior ultrasound. Other findings Trace free fluid. Hypoechoic nodular area noted within the subcutaneous soft tissues to the left of midline, in the area of pain. This measures 1.6 x 1.5 x 0.3 cm. Small amount of peripheral blood flow noted. This does not appear to be cystic. IMPRESSION: Resolving right ovarian cyst, now less than 3 cm. Stable small left hydrosalpinx since prior ultrasound. Nodular area within the subcutaneous soft tissues as seen on prior CT, unchanged, likely desmoid or endometrioma. Electronically Signed   By: Rolm Baptise M.D.   On: 05/08/2016 14:53   US Pelvis Complete  Result Date: 05/08/2016 CLINICAL DATA:  Left lower quadrant pain for 6 months. EXAM: TRANSABDOMINAL AND TRANSVAGINAL ULTRASOUND OF PELVIS TECHNIQUE: Both transabdominal and transvaginal ultrasound examinations of the pelvis were performed. Transabdominal technique was performed for global imaging of the pelvis including uterus, ovaries, adnexal regions, and pelvic cul-de-sac. It was necessary to proceed with endovaginal exam following the transabdominal exam to visualize the . COMPARISON:  Ultrasound 08/18/2015.  CT 04/06/2016. FINDINGS: Uterus Measurements: Prior hysterectomy. Endometrium Thickness: N/A. Right ovary Measurements: 4.0 x 3.1 x 1.9 cm. Previously seen  cyst within the right ovary is resolving, now less than 3 cm. No ovarian/adnexal mass. Left ovary Measurements: 2.9 x 1.7 x 1.4 cm. Normal appearance. There is a small tubular fluid-filled structure adjacent to the left ovary compatible with hydrosalpinx as seen on prior ultrasound. Other findings Trace free fluid. Hypoechoic nodular area noted within the subcutaneous soft tissues to the left of midline, in the area of pain. This measures 1.6 x 1.5 x 0.3 cm. Small amount of peripheral blood flow noted. This does not appear to be cystic. IMPRESSION: Resolving right ovarian cyst, now less than 3 cm. Stable small left hydrosalpinx since prior ultrasound. Nodular area within the subcutaneous soft tissues as seen on prior CT, unchanged, likely desmoid or endometrioma. Electronically Signed   By: Rolm Baptise M.D.   On: 05/08/2016 14:53    Assessment & Plan:  Desmoid tumor of abdomen Continued and worsening pain; will refer to General Surgery for further evaluation and management. Will coordinate appointment with [facility].  - naproxen (NAPROSYN) 500 MG tablet; Take 1 tablet (500 mg total) by mouth 2 (two) times daily with a meal. As needed for pain  Dispense: 60 tablet; Refill: 2 Routine preventative health maintenance measures emphasized. Please refer to After Visit Summary for other counseling recommendations.   Total face-to-face time with patient: 15 minutes. Over 50% of encounter was spent on counseling and coordination of care.   [name], MD, [place] Attending [place], [facility] for  Women's [facility], [place] [facility]

## 2016-05-30 ENCOUNTER — Ambulatory Visit: Payer: Self-pay | Admitting: General Surgery

## 2016-06-13 ENCOUNTER — Ambulatory Visit: Payer: Self-pay | Admitting: Obstetrics & Gynecology

## 2016-08-17 ENCOUNTER — Encounter (HOSPITAL_BASED_OUTPATIENT_CLINIC_OR_DEPARTMENT_OTHER): Payer: Self-pay | Admitting: *Deleted

## 2016-08-17 ENCOUNTER — Encounter (HOSPITAL_BASED_OUTPATIENT_CLINIC_OR_DEPARTMENT_OTHER)
Admission: RE | Admit: 2016-08-17 | Discharge: 2016-08-17 | Disposition: A | Discharge status: Home / Self Care | Payer: Self-pay | Source: Ambulatory Visit | Attending: General Surgery | Admitting: General Surgery

## 2016-08-17 LAB — BASIC METABOLIC PANEL
Anion gap: 10 (ref 5–15)
BUN: 15 mg/dL (ref 6–20)
CALCIUM: 9.4 mg/dL (ref 8.9–10.3)
CHLORIDE: 102 mmol/L (ref 101–111)
CO2: 24 mmol/L (ref 22–32)
CREATININE: 0.94 mg/dL (ref 0.44–1.00)
GFR calc non Af Amer: >60 mL/min (ref >60)
Glucose, Bld: 99 mg/dL (ref 65–99)
Potassium: 4.7 mmol/L (ref 3.5–5.1)
SODIUM: 136 mmol/L (ref 135–145)

## 2016-08-20 ENCOUNTER — Ambulatory Visit (HOSPITAL_BASED_OUTPATIENT_CLINIC_OR_DEPARTMENT_OTHER): Payer: Self-pay | Admitting: Anesthesiology

## 2016-08-20 ENCOUNTER — Encounter (HOSPITAL_BASED_OUTPATIENT_CLINIC_OR_DEPARTMENT_OTHER)
Admission: RE | Disposition: A | Discharge status: Home / Self Care | Payer: Self-pay | Source: Ambulatory Visit | Attending: General Surgery

## 2016-08-20 ENCOUNTER — Ambulatory Visit (HOSPITAL_BASED_OUTPATIENT_CLINIC_OR_DEPARTMENT_OTHER)
Admission: RE | Admit: 2016-08-20 | Discharge: 2016-08-20 | Disposition: A | Discharge status: Home / Self Care | Payer: Self-pay | Source: Ambulatory Visit | Attending: General Surgery | Admitting: General Surgery

## 2016-08-20 ENCOUNTER — Encounter (HOSPITAL_BASED_OUTPATIENT_CLINIC_OR_DEPARTMENT_OTHER): Payer: Self-pay | Admitting: *Deleted

## 2016-08-20 DIAGNOSIS — E669 Obesity, unspecified: Secondary | ICD-10-CM | POA: Insufficient documentation

## 2016-08-20 DIAGNOSIS — N808 Other endometriosis: Secondary | ICD-10-CM | POA: Insufficient documentation

## 2016-08-20 DIAGNOSIS — Z7984 Long term (current) use of oral hypoglycemic drugs: Secondary | ICD-10-CM | POA: Insufficient documentation

## 2016-08-20 DIAGNOSIS — E1151 Type 2 diabetes mellitus with diabetic peripheral angiopathy without gangrene: Secondary | ICD-10-CM | POA: Insufficient documentation

## 2016-08-20 DIAGNOSIS — I10 Essential (primary) hypertension: Secondary | ICD-10-CM | POA: Insufficient documentation

## 2016-08-20 DIAGNOSIS — J45909 Unspecified asthma, uncomplicated: Secondary | ICD-10-CM | POA: Insufficient documentation

## 2016-08-20 DIAGNOSIS — D649 Anemia, unspecified: Secondary | ICD-10-CM | POA: Insufficient documentation

## 2016-08-20 DIAGNOSIS — Z6830 Body mass index (BMI) 30.0-30.9, adult: Secondary | ICD-10-CM | POA: Insufficient documentation

## 2016-08-20 DIAGNOSIS — K2970 Gastritis, unspecified, without bleeding: Secondary | ICD-10-CM | POA: Insufficient documentation

## 2016-08-20 DIAGNOSIS — E039 Hypothyroidism, unspecified: Secondary | ICD-10-CM | POA: Insufficient documentation

## 2016-08-20 HISTORY — DX: Other complications of anesthesia, initial encounter: T88.59XA

## 2016-08-20 HISTORY — DX: Adverse effect of unspecified anesthetic, initial encounter: T41.45XA

## 2016-08-20 HISTORY — DX: Nausea with vomiting, unspecified: Z98.890

## 2016-08-20 HISTORY — PX: EXCISION OF ENDOMETRIOMA: SHX6473

## 2016-08-20 HISTORY — DX: Nausea with vomiting, unspecified: R11.2

## 2016-08-20 HISTORY — DX: Other specified postprocedural states: Z98.890

## 2016-08-20 HISTORY — DX: Gastro-esophageal reflux disease without esophagitis: K21.9

## 2016-08-20 LAB — GLUCOSE, CAPILLARY: GLUCOSE-CAPILLARY: 139 mg/dL — AB (ref 65–99)

## 2016-08-20 SURGERY — EXCISION, ENDOMETRIOMA
Anesthesia: General

## 2016-08-20 MED ORDER — PROPOFOL 10 MG/ML IV BOLUS
INTRAVENOUS | Status: DC | PRN
  Administered 2016-08-20: 200 mg via INTRAVENOUS

## 2016-08-20 MED ORDER — FENTANYL CITRATE (PF) 100 MCG/2ML IJ SOLN
INTRAMUSCULAR | Status: AC
  Filled 2016-08-20: qty 2

## 2016-08-20 MED ORDER — CEFAZOLIN SODIUM-DEXTROSE 2-4 GM/100ML-% IV SOLN
INTRAVENOUS | Status: AC
  Filled 2016-08-20: qty 100

## 2016-08-20 MED ORDER — CEFAZOLIN SODIUM-DEXTROSE 2-4 GM/100ML-% IV SOLN
2.0000 g | INTRAVENOUS | Status: AC
  Administered 2016-08-20: 2 g via INTRAVENOUS

## 2016-08-20 MED ORDER — SCOPOLAMINE 1 MG/3DAYS TD PT72
MEDICATED_PATCH | TRANSDERMAL | Status: AC
  Filled 2016-08-20: qty 1

## 2016-08-20 MED ORDER — SCOPOLAMINE 1 MG/3DAYS TD PT72
1.0000 | MEDICATED_PATCH | Freq: Once | TRANSDERMAL | Status: DC | PRN
  Administered 2016-08-20: 1.5 mg via TRANSDERMAL

## 2016-08-20 MED ORDER — LACTATED RINGERS IV SOLN
INTRAVENOUS | Status: DC
  Administered 2016-08-20: 10:00:00 via INTRAVENOUS

## 2016-08-20 MED ORDER — ONDANSETRON HCL 4 MG/2ML IJ SOLN
INTRAMUSCULAR | Status: AC
  Filled 2016-08-20: qty 2

## 2016-08-20 MED ORDER — SUCCINYLCHOLINE CHLORIDE 200 MG/10ML IV SOSY
PREFILLED_SYRINGE | INTRAVENOUS | Status: DC | PRN
  Administered 2016-08-20: 100 mg via INTRAVENOUS

## 2016-08-20 MED ORDER — MIDAZOLAM HCL 2 MG/2ML IJ SOLN
INTRAMUSCULAR | Status: AC
  Filled 2016-08-20: qty 2

## 2016-08-20 MED ORDER — LACTATED RINGERS IV SOLN
INTRAVENOUS | Status: DC | PRN
  Administered 2016-08-20: 11:00:00 via INTRAVENOUS

## 2016-08-20 MED ORDER — FENTANYL CITRATE (PF) 100 MCG/2ML IJ SOLN
25.0000 ug | INTRAMUSCULAR | Status: DC | PRN
  Administered 2016-08-20 (×2): 25 ug via INTRAVENOUS

## 2016-08-20 MED ORDER — PROMETHAZINE HCL 25 MG/ML IJ SOLN: 6.2500 mg | INTRAMUSCULAR | Status: DC | PRN

## 2016-08-20 MED ORDER — DEXAMETHASONE SODIUM PHOSPHATE 4 MG/ML IJ SOLN
INTRAMUSCULAR | Status: DC | PRN
  Administered 2016-08-20: 10 mg via INTRAVENOUS

## 2016-08-20 MED ORDER — LIDOCAINE HCL (CARDIAC) 20 MG/ML IV SOLN
INTRAVENOUS | Status: DC | PRN
  Administered 2016-08-20: 40 mg via INTRAVENOUS

## 2016-08-20 MED ORDER — ONDANSETRON HCL 4 MG/2ML IJ SOLN
INTRAMUSCULAR | Status: DC | PRN
  Administered 2016-08-20: 4 mg via INTRAVENOUS

## 2016-08-20 MED ORDER — PROPOFOL 10 MG/ML IV BOLUS
INTRAVENOUS | Status: AC
  Filled 2016-08-20: qty 20

## 2016-08-20 MED ORDER — MIDAZOLAM HCL 2 MG/2ML IJ SOLN
1.0000 mg | INTRAMUSCULAR | Status: DC | PRN
  Administered 2016-08-20: 2 mg via INTRAVENOUS

## 2016-08-20 MED ORDER — OXYCODONE HCL 5 MG PO TABS: 5.0000 mg | ORAL_TABLET | ORAL | 0 refills | Status: DC | PRN

## 2016-08-20 MED ORDER — BUPIVACAINE-EPINEPHRINE (PF) 0.25% -1:200000 IJ SOLN
INTRAMUSCULAR | Status: DC | PRN
  Administered 2016-08-20: 10 mL

## 2016-08-20 MED ORDER — ROPIVACAINE HCL 7.5 MG/ML IJ SOLN
INTRAMUSCULAR | Status: DC | PRN
  Administered 2016-08-20: 20 mL via PERINEURAL

## 2016-08-20 MED ORDER — CHLORHEXIDINE GLUCONATE CLOTH 2 % EX PADS: 6.0000 | MEDICATED_PAD | Freq: Once | CUTANEOUS | Status: DC

## 2016-08-20 MED ORDER — DEXAMETHASONE SODIUM PHOSPHATE 10 MG/ML IJ SOLN
INTRAMUSCULAR | Status: AC
  Filled 2016-08-20: qty 1

## 2016-08-20 MED ORDER — LIDOCAINE 2% (20 MG/ML) 5 ML SYRINGE
INTRAMUSCULAR | Status: AC
  Filled 2016-08-20: qty 5

## 2016-08-20 MED ORDER — EPHEDRINE SULFATE-NACL 50-0.9 MG/10ML-% IV SOSY
PREFILLED_SYRINGE | INTRAVENOUS | Status: DC | PRN
  Administered 2016-08-20 (×2): 10 mg via INTRAVENOUS

## 2016-08-20 MED ORDER — FENTANYL CITRATE (PF) 100 MCG/2ML IJ SOLN
INTRAMUSCULAR | Status: DC | PRN
  Administered 2016-08-20: 100 ug via INTRAVENOUS

## 2016-08-20 MED ORDER — EPHEDRINE 5 MG/ML INJ
INTRAVENOUS | Status: AC
  Filled 2016-08-20: qty 10

## 2016-08-20 MED ORDER — FENTANYL CITRATE (PF) 100 MCG/2ML IJ SOLN
50.0000 ug | INTRAMUSCULAR | Status: DC | PRN
  Administered 2016-08-20: 50 ug via INTRAVENOUS

## 2016-08-20 SURGICAL SUPPLY — 50 items
ADH SKN CLS APL DERMABOND .7 (GAUZE/BANDAGES/DRESSINGS) ×2
BLADE CLIPPER SURG (BLADE) IMPLANT
BLADE CURVED HARMONIC SYNERGY (BLADE) IMPLANT
BLADE HEX COATED 2.75 (ELECTRODE) ×3 IMPLANT
BLADE SURG 10 STRL SS (BLADE) ×3 IMPLANT
BLADE SURG 15 STRL LF DISP TIS (BLADE) ×1 IMPLANT
BLADE SURG 15 STRL SS (BLADE) ×3
CANISTER SUCT 1200ML W/VALVE (MISCELLANEOUS) ×3 IMPLANT
CHLORAPREP W/TINT 26ML (MISCELLANEOUS) ×3 IMPLANT
COVER BACK TABLE 60X90IN (DRAPES) ×3 IMPLANT
COVER MAYO STAND STRL (DRAPES) ×3 IMPLANT
DECANTER SPIKE VIAL GLASS SM (MISCELLANEOUS) ×3 IMPLANT
DERMABOND ADVANCED (GAUZE/BANDAGES/DRESSINGS) ×4
DERMABOND ADVANCED .7 DNX12 (GAUZE/BANDAGES/DRESSINGS) ×2 IMPLANT
DRAIN CHANNEL 19F RND (DRAIN) IMPLANT
DRAIN PENROSE 1/2X12 LTX STRL (WOUND CARE) IMPLANT
DRAPE LAPAROSCOPIC ABDOMINAL (DRAPES) ×3 IMPLANT
DRAPE UTILITY XL STRL (DRAPES) ×3 IMPLANT
ELECT REM PT RETURN 9FT ADLT (ELECTROSURGICAL) ×3
ELECTRODE REM PT RTRN 9FT ADLT (ELECTROSURGICAL) ×1 IMPLANT
GLOVE BIO SURGEON STRL SZ8 (GLOVE) ×3 IMPLANT
GLOVE BIOGEL PI IND STRL 8 (GLOVE) ×1 IMPLANT
GLOVE BIOGEL PI INDICATOR 8 (GLOVE) ×2
GOWN STRL REUS W/ TWL LRG LVL3 (GOWN DISPOSABLE) ×1 IMPLANT
GOWN STRL REUS W/ TWL XL LVL3 (GOWN DISPOSABLE) ×1 IMPLANT
GOWN STRL REUS W/TWL LRG LVL3 (GOWN DISPOSABLE) ×2
GOWN STRL REUS W/TWL XL LVL3 (GOWN DISPOSABLE) ×2
NEEDLE HYPO 25X1 1.5 SAFETY (NEEDLE) ×3 IMPLANT
NS IRRIG 1000ML POUR BTL (IV SOLUTION) IMPLANT
PACK BASIN DAY SURGERY FS (CUSTOM PROCEDURE TRAY) ×3 IMPLANT
PENCIL BUTTON HOLSTER BLD 10FT (ELECTRODE) ×3 IMPLANT
SLEEVE SCD COMPRESS KNEE MED (MISCELLANEOUS) IMPLANT
SPONGE LAP 18X18 X RAY DECT (DISPOSABLE) IMPLANT
SPONGE LAP 4X18 X RAY DECT (DISPOSABLE) ×3 IMPLANT
SUT ETHILON 3 0 FSL (SUTURE) IMPLANT
SUT PROLENE 2 0 SH DA (SUTURE) ×3 IMPLANT
SUT PROLENE 4 0 P 3 18 (SUTURE) ×3 IMPLANT
SUT PROLENE 4 0 TF (SUTURE) IMPLANT
SUT SILK 2 0 SH (SUTURE) ×3 IMPLANT
SUT SILK 2 0 TIES 17X18 (SUTURE) ×3
SUT SILK 2-0 18XBRD TIE BLK (SUTURE) ×1 IMPLANT
SUT VIC AB 3-0 SH 27 (SUTURE) ×3
SUT VIC AB 3-0 SH 27X BRD (SUTURE) ×1 IMPLANT
SYR BULB 3OZ (MISCELLANEOUS) IMPLANT
SYR CONTROL 10ML LL (SYRINGE) ×3 IMPLANT
TOWEL OR 17X24 6PK STRL BLUE (TOWEL DISPOSABLE) ×6 IMPLANT
TOWEL OR NON WOVEN STRL DISP B (DISPOSABLE) ×3 IMPLANT
TUBE CONNECTING 20'X1/4 (TUBING) ×1
TUBE CONNECTING 20X1/4 (TUBING) ×2 IMPLANT
YANKAUER SUCT BULB TIP NO VENT (SUCTIONS) ×3 IMPLANT

## 2016-08-20 NOTE — Progress Notes (Signed)
Assisted Dr. Turk with left, ultrasound guided, transabdominal plane block. Side rails up, monitors on throughout procedure. See vital signs in flow sheet. Tolerated Procedure well. 

## 2016-08-20 NOTE — Anesthesia Procedure Notes (Signed)
Procedure Name: Intubation Date/Time: 08/20/2016 10:44 AM Performed by: Rayvon Char Pre-anesthesia Checklist: Patient identified, Emergency Drugs available, Suction available and Patient being monitored Patient Re-evaluated:Patient Re-evaluated prior to inductionOxygen Delivery Method: Circle system utilized Preoxygenation: Pre-oxygenation with 100% oxygen Intubation Type: IV induction Ventilation: Mask ventilation without difficulty Laryngoscope Size: Mac and 3 Grade View: Grade III Tube type: Oral Tube size: 7.0 mm Number of attempts: 2 Airway Equipment and Method: Stylet Secured at: 21 cm Dental Injury: Teeth and Oropharynx as per pre-operative assessment

## 2016-08-20 NOTE — H&P (Signed)
[name] 05/30/2016 9:35 AM Location: [facility] Patient #: [id number] DOB: 02-Aug-1971 Married / Language: Spanish / Race: American [place] or [place] Native Female   History of Present Illness Laura Neri E. Grandville Silos MD; 05/30/2016 9:45 AM) The patient is a 45 year old female who presents to discuss consultation. I saw [name] in the hospital on March 13, 2016 regarding a possible endometrioma subcutaneously along her old cesarean section scar. This was seen on CT scan done for gastritis. She is having worsening pain in this location which is in the left lower quadrant. It is exacerbated by movement and walking. Additionally, sexual intercourse also exacerbates it. She has been abstaining from that. When she was admitted to the hospital in July she had gastritis. She is still having some issues with that including some pain after eating. This is in her left upper quadrant.   Other Problems Laura Leventhal, RN, BSN; 05/30/2016 9:35 AM) Asthma  Diabetes Mellitus  High blood pressure  Thyroid Disease   Past Surgical History Laura Leventhal, RN, BSN; 05/30/2016 9:35 AM) Tonsillectomy   Allergies Laura Leventhal, RN, BSN; 05/30/2016 9:38 AM) No Known Drug Allergies 05/30/2016  Medication History Laura Leventhal, RN, BSN; 05/30/2016 9:41 AM) AmLODIPine Besylate (5MG  Tablet, Oral daily) Active. Pepcid (20MG  Tablet, Oral daily) Active. Synthroid (100MCG Tablet, Oral two times daily) Active. Prinzide (20-25MG  Tablet, Oral daily) Active. MetFORMIN HCl (500MG  Tablet, Oral daily) Active. Naproxen (500MG  Tablet, Oral) Active. Phenergan (25MG  Suppository, Rectal as needed) Active. Medications Reconciled  Social History Multimedia programmer, Therapist, sports, BSN; 05/30/2016 9:35 AM) No alcohol use  No caffeine use  No drug use  Tobacco use  Never smoker.  Family History (Laura Leventhal, RN, BSN; 05/30/2016 9:35 AM) Diabetes Mellitus  Brother, Mother. Hypertension   Mother.  Pregnancy / Birth History Laura Leventhal, RN, BSN; 05/30/2016 9:35 AM) [name] Age  4 Maternal age  55-25 Para  1    Review of Systems Occupational hygienist, BSN; 05/30/2016 9:35 AM) General Not Present- Appetite Loss, Chills, Fatigue, Fever, Night Sweats, Weight Gain and Weight Loss. Skin Not Present- Change in Wart/Mole, Dryness, Hives, Jaundice, New Lesions, Non-Healing Wounds, Rash and Ulcer. HEENT Not Present- Earache, Hearing Loss, Hoarseness, Nose Bleed, Oral Ulcers, Ringing in the Ears, Seasonal Allergies, Sinus Pain, Sore Throat, Visual Disturbances, Wears glasses/contact lenses and Yellow Eyes. Respiratory Not Present- Bloody sputum, Chronic Cough, Difficulty Breathing, Snoring and Wheezing. Cardiovascular Not Present- Chest Pain, Difficulty Breathing Lying Down, Leg Cramps, Palpitations, Rapid Heart Rate, Shortness of Breath and Swelling of Extremities. Gastrointestinal Present- Abdominal Pain. Not Present- Bloating, Bloody Stool, Change in Bowel Habits, Chronic diarrhea, Constipation, Difficulty Swallowing, Excessive gas, Gets full quickly at meals, Hemorrhoids, Indigestion, Nausea, Rectal Pain and Vomiting. Female Genitourinary Not Present- Frequency, Nocturia, Painful Urination, Pelvic Pain and Urgency. Musculoskeletal Not Present- Back Pain, Joint Pain, Joint Stiffness, Muscle Pain, Muscle Weakness and Swelling of Extremities. Neurological Not Present- Decreased Memory, Fainting, Headaches, Numbness, Seizures, Tingling, Tremor, Trouble walking and Weakness. Psychiatric Not Present- Anxiety, Bipolar, Change in Sleep Pattern, Depression, Fearful and Frequent crying. Endocrine Not Present- Cold Intolerance, Excessive Hunger, Hair Changes, Heat Intolerance, Hot flashes and New Diabetes. Hematology Not Present- Blood Thinners, Easy Bruising, Excessive bleeding, Gland problems, HIV and Persistent Infections.  Vitals Occupational hygienist, BSN; 05/30/2016 9:38 AM) 05/30/2016 9:35  AM Weight: 163.4 lb Height: 60in Body Surface Area: 1.71 m Body Mass Index: 31.91 kg/m  Temp.: 98.61F(Oral)  Pulse: 82 (Regular)  BP: 170/110 (Sitting, Left Arm, Standard)  Physical Exam Laura Neri E. Grandville Silos MD; 05/30/2016 9:46 AM) General Mental Status-Alert. General Appearance-Consistent with stated age. Hydration-Well hydrated. Voice-Normal.  Head and Neck Head-normocephalic, atraumatic with no lesions or palpable masses. Trachea-midline. Thyroid Gland Characteristics - normal size and consistency.  Eye Eyeball - Bilateral-Extraocular movements intact. Sclera/Conjunctiva - Bilateral-No scleral icterus.  Chest and Lung Exam Chest and lung exam reveals -quiet, even and easy respiratory effort with no use of accessory muscles and on auscultation, normal breath sounds, no adventitious sounds and normal vocal resonance. Inspection Chest Wall - Normal. Back - normal.  Cardiovascular Cardiovascular examination reveals -normal heart sounds, regular rate and rhythm with no murmurs and normal pedal pulses bilaterally.  Abdomen Inspection Inspection of the abdomen reveals - No Hernias. Palpation/Percussion Palpation and Percussion of the abdomen reveal - Soft, No Rebound tenderness, No Rigidity (guarding) and No hepatosplenomegaly. Tenderness - Left Upper Quadrant. Note: Tender 2 cm subcutaneous mass lateral left lower quadrant, lower midline incision scar. Auscultation Auscultation of the abdomen reveals - Bowel sounds normal.  Neurologic Neurologic evaluation reveals -alert and oriented x 3 with no impairment of recent or remote memory. Mental Status-Normal.  Musculoskeletal Global Assessment -Note: no gross deformities.  Normal Exam - Left-Upper Extremity Strength Normal and Lower Extremity Strength Normal. Normal Exam - Right-Upper Extremity Strength Normal and Lower Extremity Strength Normal.  Lymphatic Head &  Neck  General Head & Neck Lymphatics: Bilateral - Description - Normal. Axillary  General Axillary Region: Bilateral - Description - Normal. Tenderness - Non Tender. Femoral & Inguinal  Generalized Femoral & Inguinal Lymphatics: Bilateral - Description - No Generalized lymphadenopathy.    Assessment & Plan Laura Neri E. 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 I offered excision as an elective procedure. The procedure, risks, and benefits were discussed in detail with her. She is agreeable. We will plan to do this in the near future. Current Plans Pt Education - [facility] Free Text Education/Instructions: discussed with patient and provided information. GASTRITIS (K29.70) Impression: This seems mild. I advised her to take Pepcid over-the-counter. If this does not improve, she may need further evaluation by gastroenterology.  12/18 update Still having pain from endometrioma as well as gastritis. Abd soft, endometrioma marked medial LLQ - tender No other tenderness  Georganna Skeans, MD, MPH, FACS Trauma: [phone] General Surgery: [phone]

## 2016-08-20 NOTE — Transfer of Care (Signed)
Immediate Anesthesia Transfer of Care Note  Patient: [name]  Procedure(s) Performed: Procedure(s): EXCISION OF ENDOMETRIOMA LEFT LOWER QUADRANT (N/A)  Patient Location: PACU  Anesthesia Type:General  Level of Consciousness: awake, alert  and oriented  Airway & Oxygen Therapy: Patient Spontanous Breathing and Patient connected to face mask oxygen  Post-op Assessment: Report given to RN and Post -op Vital signs reviewed and stable  Post vital signs: Reviewed and stable  Last Vitals:  Vitals:   08/20/16 1000 08/20/16 1005  BP: (!) 182/106 (!) 176/107  Pulse: 78 76  Resp: 19 13  Temp:      Last Pain:  Vitals:   08/20/16 0922  TempSrc: Oral         Complications: No apparent anesthesia complications

## 2016-08-20 NOTE — Discharge Instructions (Signed)

## 2016-08-20 NOTE — Anesthesia Postprocedure Evaluation (Signed)
Anesthesia Post Note  Patient: [name]  Procedure(s) Performed: Procedure(s) (LRB): EXCISION OF ENDOMETRIOMA LEFT LOWER QUADRANT (N/A)  Patient location during evaluation: PACU Anesthesia Type: General and Regional Level of consciousness: awake and alert Pain management: pain level controlled Vital Signs Assessment: post-procedure vital signs reviewed and stable Respiratory status: spontaneous breathing, nonlabored ventilation, respiratory function stable and patient connected to nasal cannula oxygen Cardiovascular status: blood pressure returned to baseline and stable Postop Assessment: no signs of nausea or vomiting Anesthetic complications: no       Last Vitals:  Vitals:   08/20/16 1239 08/20/16 1330  BP: 138/86 138/88  Pulse: 100 97  Resp: 18 18  Temp:  36.4 C    Last Pain:  Vitals:   08/20/16 1330  TempSrc:   PainSc: 0-No pain                 Catalina Gravel

## 2016-08-20 NOTE — Anesthesia Preprocedure Evaluation (Signed)
Anesthesia Evaluation  Patient identified by MRN, date of birth, ID band Patient awake    Reviewed: Allergy & Precautions, NPO status , Patient's Chart, lab work & pertinent test results  History of Anesthesia Complications (+) PONV and history of anesthetic complications  Airway Mallampati: II  TM Distance: >3 FB Neck ROM: Full    Dental  (+) Teeth Intact, Dental Advisory Given   Pulmonary asthma ,    Pulmonary exam normal breath sounds clear to auscultation       Cardiovascular hypertension, Pt. on medications + Peripheral Vascular Disease  Normal cardiovascular exam Rhythm:Regular Rate:Normal     Neuro/Psych  Headaches, CVA, No Residual Symptoms negative psych ROS   GI/Hepatic negative GI ROS, Neg liver ROS,   Endo/Other  diabetes, Type 2, Oral Hypoglycemic AgentsHypothyroidism Obesity   Renal/GU negative Renal ROS     Musculoskeletal negative musculoskeletal ROS (+)   Abdominal   Peds  Hematology negative hematology ROS (+) Blood dyscrasia, anemia ,   Anesthesia Other Findings Day of surgery medications reviewed with the patient.  Reproductive/Obstetrics negative OB ROS                             Anesthesia Physical Anesthesia Plan  ASA: III  Anesthesia Plan: General   Post-op Pain Management: GA combined w/ Regional for post-op pain   Induction: Intravenous  Airway Management Planned: Oral ETT  Additional Equipment:   Intra-op Plan:   Post-operative Plan: Extubation in OR  Informed Consent: I have reviewed the patients History and Physical, chart, labs and discussed the procedure including the risks, benefits and alternatives for the proposed anesthesia with the patient or authorized representative who has indicated his/her understanding and acceptance.   Dental advisory given  Plan Discussed with: CRNA  Anesthesia Plan Comments: (Risks/benefits of general  anesthesia discussed with patient including risk of damage to teeth, lips, gum, and tongue, nausea/vomiting, allergic reactions to medications, and the possibility of heart attack, stroke and death.  All patient questions answered.  Patient wishes to proceed.)        Anesthesia Quick Evaluation

## 2016-08-20 NOTE — Interval H&P Note (Signed)
History and Physical Interval Note:  08/20/2016 10:24 AM  [name]  has presented today for surgery, with the diagnosis of ENDOMETRIOMA  The various methods of treatment have been discussed with the patient and family. After consideration of risks, benefits and other options for treatment, the patient has consented to  Procedure(s): EXCISION OF ENDOMETRIOMA LEFT LOWER QUADRANT (N/A) as a surgical intervention .  The patient's history has been reviewed, patient examined, no change in status, stable for surgery.  I have reviewed the patient's chart and labs.  Questions were answered to the patient's satisfaction.     Samar Dass E

## 2016-08-20 NOTE — Op Note (Signed)
08/20/2016  11:33 AM  PATIENT:  [name]  45 y.o. female  PRE-OPERATIVE DIAGNOSIS:  ENDOMETRIOMA LLQ ABDOMINAL WALL  POST-OPERATIVE DIAGNOSIS:  ENDOMETRIOMA LLQ ABDOMINAL WALL  PROCEDURE:  Procedure(s): EXCISION OF ENDOMETRIOMA LEFT LOWER QUADRANT ABDOMINAL WALL 5CM WITH LAYERED CLOSURE  SURGEON:  Surgeon(s): Georganna Skeans, MD  ASSISTANTS: none   ANESTHESIA:   local, regional and general  EBL:  Total I/O In: 1000 [I.V.:1000] Out: 15 [Blood:15]  BLOOD ADMINISTERED:none  DRAINS: none   SPECIMEN:  Excision  DISPOSITION OF SPECIMEN:  PATHOLOGY  COUNTS:  YES  DICTATION: .Dragon Dictation Findings: 5 cm mass adherent to the anterior fascia.  Procedure in detail: [name] presents for excision of endometrioma left lower quadrant abdominal wall. Her site was marked. She received intravenous antibiotics. Informed consent was obtained. A tap block was placed by anesthesia. She was brought to the operating room and general endotracheal anesthesia was administered by the anesthesia staff. Her abdomen was prepped and draped in a sterile fashion. Time out procedure was performed. Local anesthetic was injected and a transverse incision was made. Subcutaneous tissues were dissected down easily identifying this 5 cm mass. It was circumferentially dissected with cautery. It was very adherent to the anterior fascia so complete excision required removing a small piece of the anterior fascia. The mass was completely removed and there were no other palpable subcutaneous masses. Specimen was passed off and sent to pathology. The small fascial defect was closed with some interrupted 2-0 Prolene sutures. Subcutaneous tissues were irrigated and hemostasis was ensured. Subcutaneous tissues were approximated with interrupted 3-0 Vicryl. Skin was closed with running 4-0 Monocryl subcuticular followed by Dermabond. All counts were correct. She tolerated the procedure well without apparent complication was  taken recovery in stable condition. PATIENT DISPOSITION:  PACU - hemodynamically stable.   Delay start of Pharmacological VTE agent (>24hrs) due to surgical blood loss or risk of bleeding:  no  Georganna Skeans, MD, MPH, FACS Pager: [phone]  12/18/201711:33 AM

## 2016-08-20 NOTE — Anesthesia Procedure Notes (Signed)
Anesthesia Regional Block:  TAP block  Pre-Anesthetic Checklist: ,, timeout performed, Correct Patient, Correct Site, Correct Laterality, Correct Procedure, Correct Position, site marked, Risks and benefits discussed,  Surgical consent,  Pre-op evaluation,  At surgeon's request and post-op pain management  Laterality: Left  Prep: chloraprep       Needles:  Injection technique: Single-shot  Needle Type: Echogenic Stimulator Needle     Needle Length: 10cm 10 cm Needle Gauge: 21 and 21 G    Additional Needles:  Procedures: ultrasound guided (picture in chart) TAP block Narrative:  Start time: 08/20/2016 9:56 AM End time: 08/20/2016 10:06 AM Injection made incrementally with aspirations every 5 mL.  Performed by: Personally   Additional Notes: No pain on injection. No increased resistance to injection. Injection made in 5cc increments.  Good needle visualization.  Patient tolerated procedure well.

## 2016-08-21 ENCOUNTER — Encounter (HOSPITAL_BASED_OUTPATIENT_CLINIC_OR_DEPARTMENT_OTHER): Payer: Self-pay | Admitting: General Surgery

## 2016-08-22 NOTE — Addendum Note (Signed)
Addendum  created 08/22/16 1108 by Tawni Millers, CRNA   Charge Capture section accepted

## 2016-09-29 ENCOUNTER — Encounter (HOSPITAL_COMMUNITY): Payer: Self-pay | Admitting: Family Medicine

## 2016-09-29 ENCOUNTER — Ambulatory Visit (HOSPITAL_COMMUNITY)
Admission: EM | Admit: 2016-09-29 | Discharge: 2016-09-29 | Disposition: A | Discharge status: Home / Self Care | Payer: Self-pay | Attending: Family Medicine | Admitting: Family Medicine

## 2016-09-29 DIAGNOSIS — R69 Illness, unspecified: Secondary | ICD-10-CM

## 2016-09-29 DIAGNOSIS — J111 Influenza due to unidentified influenza virus with other respiratory manifestations: Secondary | ICD-10-CM

## 2016-09-29 MED ORDER — GUAIFENESIN-CODEINE 100-10 MG/5ML PO SYRP: 10.0000 mL | ORAL_SOLUTION | Freq: Four times a day (QID) | ORAL | 0 refills | Status: DC | PRN

## 2016-09-29 MED ORDER — IPRATROPIUM BROMIDE 0.06 % NA SOLN: 2.0000 | Freq: Four times a day (QID) | NASAL | 1 refills | Status: DC

## 2016-09-29 NOTE — ED Triage Notes (Signed)
Pt here for cough, congestion, fever.

## 2016-09-29 NOTE — ED Provider Notes (Signed)
[place]    CSN: [id number] Arrival date & time: 09/29/16  1636     History   Chief Complaint Chief Complaint  Patient presents with  . Cough    HPI [name] is a 46 y.o. female.   The history is provided by the patient.  Cough  Cough characteristics:  Non-productive and dry Severity:  Moderate Onset quality:  Sudden Duration:  2 days Progression:  Unchanged Chronicity:  New Smoker: no   Context: sick contacts, upper respiratory infection and weather changes   Relieved by:  None tried Worsened by:  Nothing Ineffective treatments:  None tried Associated symptoms: myalgias   Associated symptoms: no fever and no shortness of breath     Past Medical History:  Diagnosis Date  . ABORTION, SPONTANEOUS 08/18/2007  . Acquired hypothyroidism   . Anemia   . Asthma   . Complication of anesthesia   . 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 Ectopic pregnancy 04/2006   treated with medication  . GERD (gastroesophageal reflux disease)   . Hepatic steatosis 07/2006, 04/2014   on Abd CT done for pain  . Hypertension   . Hyperthyroidism 10/2000   RAI treatment,  multinodular goiter  . IMPAIRED GLUCOSE TOLERANCE 08/18/2007  . Migraine    "had them often; many years" (03/13/2016)  . Ovarian cyst 04/2014  . PONV (postoperative nausea and vomiting)   . Preterm delivery 12/2010   x3, placental abruption, maternal htn, DM  . Type II diabetes mellitus ([place]) dx'd 12/2015    Patient Active Problem List   Diagnosis Date Noted  . Abdominal pain 03/13/2016  . Hypokalemia 03/13/2016  . Desmoid tumor of abdomen   . Pain of left scapula 11/12/2013  . Numbness and tingling in right hand 06/17/2012  . Female pelvic pain 05/07/2012  . Anemia 04/03/2011  . MIGRAINE HEADACHE 05/12/2007  . OCCLUSION, CEREBRAL ARTERY NOS W/INFARCTION 12/12/2006  . Skin rash 12/11/2006  . Hypothyroidism 10/31/2006  . HLD (hyperlipidemia)  10/31/2006  . Overweight (BMI 25.0-29.9) 10/31/2006  . HYPERTENSION, BENIGN SYSTEMIC 10/31/2006  . Asthma 10/31/2006  . UMBILICAL HERNIA [id number]  . MENSTRUAL CYCLE, IRREGULAR 10/31/2006    Past Surgical History:  Procedure Laterality Date  . ABDOMINAL HYSTERECTOMY  04/24/2012   Procedure: HYSTERECTOMY ABDOMINAL;  Surgeon: Terrance Mass, MD;  Location: [facility];  Service: Gynecology;;  . [place]  . CESAREAN SECTION  2012  . CHOLECYSTECTOMY  1997  . COLONOSCOPY  10/2014   1 hyperplastic polyp, rpt 10 yrs Henrene Pastor)  . ESOPHAGOGASTRODUODENOSCOPY  10/2014   WNL  . EXCISION OF ENDOMETRIOMA N/A 08/20/2016   Procedure: EXCISION OF ENDOMETRIOMA LEFT LOWER QUADRANT;  Surgeon: Georganna Skeans, MD;  Location: [facility];  Service: General;  Laterality: N/A;  . LAPAROSCOPIC HYSTERECTOMY  04/24/2012   Procedure: HYSTERECTOMY TOTAL LAPAROSCOPIC;  Surgeon: Terrance Mass, MD;  Location: [facility];  Service: Gynecology;  Laterality: N/A;  2 1/2 hours OR time.  Dr. Phineas Real to assisting   . LAPAROSCOPIC LYSIS INTESTINAL ADHESIONS  2013  . OVARIAN CYST REMOVAL  2012  . THYROID SURGERY  ~ 2000   goiter removed    OB History    Gravida Para Term Preterm AB Living   5 1   1 3 1    SAB TAB Ectopic Multiple Live Births   3               Home Medications  Prior to Admission medications   Medication Sig Start Date End Date Taking? Authorizing Provider  cyclobenzaprine (FLEXERIL) 5 MG tablet Take 5 mg by mouth 3 (three) times daily as needed for muscle spasms.    Historical Provider, MD  diclofenac (VOLTAREN) 75 MG EC tablet Take 75 mg by mouth 2 (two) times daily.    Historical Provider, MD  guaiFENesin-codeine (ROBITUSSIN AC) 100-10 MG/5ML syrup Take 10 mLs by mouth 4 (four) times daily as needed for cough. 09/29/16   Billy Fischer, MD  ipratropium (ATROVENT) 0.06 % nasal spray Place 2 sprays into both nostrils 4 (four) times daily. 09/29/16   Billy Fischer, MD    levothyroxine (SYNTHROID, LEVOTHROID) 200 MCG tablet Take one tablet daily **MUST HAVE PHYSICAL FOR FURTHER REFILLS** 09/13/15   Ria Bush, MD  lisinopril-hydrochlorothiazide (PRINZIDE,ZESTORETIC) 20-25 MG tablet Take 1 tablet by mouth daily.    Historical Provider, MD  metFORMIN (GLUCOPHAGE-XR) 500 MG 24 hr tablet Take 500 mg by mouth daily with breakfast.    Historical Provider, MD  naproxen (NAPROSYN) 500 MG tablet Take 1 tablet (500 mg total) by mouth 2 (two) times daily with a meal. As needed for pain 05/14/16   Osborne Oman, MD  oxyCODONE (OXY IR/ROXICODONE) 5 MG immediate release tablet Take 1-2 tablets (5-10 mg total) by mouth every 4 (four) hours as needed for moderate pain or severe pain. 08/20/16   Georganna Skeans, MD    Family History Family History  Problem Relation Age of Onset  . Hypertension Father   . Stroke Father     hemorrhagic, deceased  . Diabetes Mother   . Uterine cancer Mother   . Coronary artery disease Neg Hx   . Cancer Neg Hx     Social History Social History  Substance Use Topics  . Smoking status: Never Smoker  . Smokeless tobacco: Never Used  . Alcohol use No     Allergies   Patient has no known allergies.   Review of Systems Review of Systems  Constitutional: Negative.  Negative for fever.  HENT: Negative.   Respiratory: Positive for cough. Negative for shortness of breath.   Cardiovascular: Negative.   Gastrointestinal: Negative.   Genitourinary: Negative.   Musculoskeletal: Positive for myalgias.  Skin: Negative.   All other systems reviewed and are negative.    Physical Exam Triage Vital Signs ED Triage Vitals  Enc Vitals Group     BP 09/29/16 1753 152/96     Pulse Rate 09/29/16 1753 (!) 125     Resp 09/29/16 1753 18     Temp 09/29/16 1753 99.4 F (37.4 C)     Temp src --      SpO2 09/29/16 1753 97 %     Weight --      Height --      Head Circumference --      Peak Flow --      Pain Score 09/29/16 1754 4      Pain Loc --      Pain Edu? --      Excl. in [place]? --    No data found.   Updated Vital Signs BP 152/96   Pulse (!) 125   Temp 99.4 F (37.4 C)   Resp 18   LMP 11/12/2011   SpO2 97%   Visual Acuity Right Eye Distance:   Left Eye Distance:   Bilateral Distance:    Right Eye Near:   Left Eye Near:    Bilateral Near:  Physical Exam  Constitutional: She is oriented to person, place, and time. She appears well-developed and well-nourished. No distress.  HENT:  Right Ear: External ear normal.  Left Ear: External ear normal.  Nose: Nose normal.  Mouth/Throat: Oropharynx is clear and moist.  Eyes: Pupils are equal, round, and reactive to light.  Neck: Normal range of motion.  Cardiovascular: Normal rate, regular rhythm, normal heart sounds and intact distal pulses.   Pulmonary/Chest: Effort normal and breath sounds normal.  Abdominal: Soft. Bowel sounds are normal.  Lymphadenopathy:    She has no cervical adenopathy.  Neurological: She is alert and oriented to person, place, and time.  Skin: Skin is warm and dry.  Nursing note and vitals reviewed.    UC Treatments / Results  Labs (all labs ordered are listed, but only abnormal results are displayed) Labs Reviewed - No data to display  EKG  EKG Interpretation None       Radiology No results found.  Procedures Procedures (including critical care time)  Medications Ordered in UC Medications - No data to display   Initial Impression / Assessment and Plan / UC Course  I have reviewed the triage vital signs and the nursing notes.  Pertinent labs & imaging results that were available during my care of the patient were reviewed by me and considered in my medical decision making (see chart for details).       Final Clinical Impressions(s) / UC Diagnoses   Final diagnoses:  Influenza-like illness    New Prescriptions Discharge Medication List as of 09/29/2016  6:09 PM    START taking these medications    Details  guaiFENesin-codeine (ROBITUSSIN AC) 100-10 MG/5ML syrup Take 10 mLs by mouth 4 (four) times daily as needed for cough., Starting Sat 09/29/2016, Print    ipratropium (ATROVENT) 0.06 % nasal spray Place 2 sprays into both nostrils 4 (four) times daily., Starting Sat 09/29/2016, Print         Billy Fischer, MD 10/04/16 1430

## 2016-10-07 ENCOUNTER — Encounter (HOSPITAL_COMMUNITY): Payer: Self-pay

## 2016-10-07 ENCOUNTER — Emergency Department (HOSPITAL_COMMUNITY)
Admission: EM | Admit: 2016-10-07 | Discharge: 2016-10-07 | Disposition: A | Discharge status: Home / Self Care | Payer: Self-pay | Attending: Emergency Medicine | Admitting: Emergency Medicine

## 2016-10-07 DIAGNOSIS — Z7984 Long term (current) use of oral hypoglycemic drugs: Secondary | ICD-10-CM | POA: Insufficient documentation

## 2016-10-07 DIAGNOSIS — H1032 Unspecified acute conjunctivitis, left eye: Secondary | ICD-10-CM | POA: Insufficient documentation

## 2016-10-07 DIAGNOSIS — E039 Hypothyroidism, unspecified: Secondary | ICD-10-CM | POA: Insufficient documentation

## 2016-10-07 DIAGNOSIS — Z8673 Personal history of transient ischemic attack (TIA), and cerebral infarction without residual deficits: Secondary | ICD-10-CM | POA: Insufficient documentation

## 2016-10-07 DIAGNOSIS — E119 Type 2 diabetes mellitus without complications: Secondary | ICD-10-CM | POA: Insufficient documentation

## 2016-10-07 DIAGNOSIS — J45909 Unspecified asthma, uncomplicated: Secondary | ICD-10-CM | POA: Insufficient documentation

## 2016-10-07 DIAGNOSIS — Z79899 Other long term (current) drug therapy: Secondary | ICD-10-CM | POA: Insufficient documentation

## 2016-10-07 MED ORDER — ERYTHROMYCIN 5 MG/GM OP OINT
1.0000 "application " | TOPICAL_OINTMENT | Freq: Once | OPHTHALMIC | Status: AC
  Administered 2016-10-07: 1 via OPHTHALMIC
  Filled 2016-10-07: qty 3.5

## 2016-10-07 NOTE — ED Provider Notes (Signed)
[facility] DEPT Provider Note   CSN: [id number] Arrival date & time: 10/07/16  0108     History   Chief Complaint Chief Complaint  Patient presents with  . Conjunctivitis    HPI [name] is a 46 y.o. female.   Conjunctivitis  This is a new problem. The current episode started 6 to 12 hours ago. The problem occurs constantly. The problem has not changed since onset.Pertinent negatives include no chest pain, no abdominal pain, no headaches and no shortness of breath. Nothing relieves the symptoms. She has tried nothing for the symptoms.   Pt recently had flu like illness last week. No sick contacts.   Past Medical History:  Diagnosis Date  . ABORTION, SPONTANEOUS 08/18/2007  . Acquired hypothyroidism   . Anemia   . Asthma   . Complication of anesthesia   . 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 Ectopic pregnancy 04/2006   treated with medication  . GERD (gastroesophageal reflux disease)   . Hepatic steatosis 07/2006, 04/2014   on Abd CT done for pain  . Hypertension   . Hyperthyroidism 10/2000   RAI treatment,  multinodular goiter  . IMPAIRED GLUCOSE TOLERANCE 08/18/2007  . Migraine    "had them often; many years" (03/13/2016)  . Ovarian cyst 04/2014  . PONV (postoperative nausea and vomiting)   . Preterm delivery 12/2010   x3, placental abruption, maternal htn, DM  . Type II diabetes mellitus ([place]) dx'd 12/2015    Patient Active Problem List   Diagnosis Date Noted  . Abdominal pain 03/13/2016  . Hypokalemia 03/13/2016  . Desmoid tumor of abdomen   . Pain of left scapula 11/12/2013  . Numbness and tingling in right hand 06/17/2012  . Female pelvic pain 05/07/2012  . Anemia 04/03/2011  . MIGRAINE HEADACHE 05/12/2007  . OCCLUSION, CEREBRAL ARTERY NOS W/INFARCTION 12/12/2006  . Skin rash 12/11/2006  . Hypothyroidism 10/31/2006  . HLD (hyperlipidemia) 10/31/2006  . Overweight (BMI 25.0-29.9) 10/31/2006  .  HYPERTENSION, BENIGN SYSTEMIC 10/31/2006  . Asthma 10/31/2006  . UMBILICAL HERNIA Q000111Q  . MENSTRUAL CYCLE, IRREGULAR 10/31/2006    Past Surgical History:  Procedure Laterality Date  . ABDOMINAL HYSTERECTOMY  04/24/2012   Procedure: HYSTERECTOMY ABDOMINAL;  Surgeon: Terrance Mass, MD;  Location: [facility];  Service: Gynecology;;  . [place]  . CESAREAN SECTION  2012  . CHOLECYSTECTOMY  1997  . COLONOSCOPY  10/2014   1 hyperplastic polyp, rpt 10 yrs Henrene Pastor)  . ESOPHAGOGASTRODUODENOSCOPY  10/2014   WNL  . EXCISION OF ENDOMETRIOMA N/A 08/20/2016   Procedure: EXCISION OF ENDOMETRIOMA LEFT LOWER QUADRANT;  Surgeon: Georganna Skeans, MD;  Location: [facility];  Service: General;  Laterality: N/A;  . LAPAROSCOPIC HYSTERECTOMY  04/24/2012   Procedure: HYSTERECTOMY TOTAL LAPAROSCOPIC;  Surgeon: Terrance Mass, MD;  Location: [facility];  Service: Gynecology;  Laterality: N/A;  2 1/2 hours OR time.  Dr. Phineas Real to assisting   . LAPAROSCOPIC LYSIS INTESTINAL ADHESIONS  2013  . OVARIAN CYST REMOVAL  2012  . THYROID SURGERY  ~ 2000   goiter removed    OB History    Gravida Para Term Preterm AB Living   5 1   1 3 1    SAB TAB Ectopic Multiple Live Births   3               Home Medications    Prior to Admission medications   Medication Sig Start  Date End Date Taking? Authorizing Provider  cyclobenzaprine (FLEXERIL) 5 MG tablet Take 5 mg by mouth 3 (three) times daily as needed for muscle spasms.    Historical Provider, MD  diclofenac (VOLTAREN) 75 MG EC tablet Take 75 mg by mouth 2 (two) times daily.    Historical Provider, MD  guaiFENesin-codeine (ROBITUSSIN AC) 100-10 MG/5ML syrup Take 10 mLs by mouth 4 (four) times daily as needed for cough. 09/29/16   Billy Fischer, MD  ipratropium (ATROVENT) 0.06 % nasal spray Place 2 sprays into both nostrils 4 (four) times daily. 09/29/16   Billy Fischer, MD  levothyroxine (SYNTHROID, LEVOTHROID) 200 MCG tablet Take one tablet  daily **MUST HAVE PHYSICAL FOR FURTHER REFILLS** 09/13/15   Ria Bush, MD  lisinopril-hydrochlorothiazide (PRINZIDE,ZESTORETIC) 20-25 MG tablet Take 1 tablet by mouth daily.    Historical Provider, MD  metFORMIN (GLUCOPHAGE-XR) 500 MG 24 hr tablet Take 500 mg by mouth daily with breakfast.    Historical Provider, MD  naproxen (NAPROSYN) 500 MG tablet Take 1 tablet (500 mg total) by mouth 2 (two) times daily with a meal. As needed for pain 05/14/16   Osborne Oman, MD  oxyCODONE (OXY IR/ROXICODONE) 5 MG immediate release tablet Take 1-2 tablets (5-10 mg total) by mouth every 4 (four) hours as needed for moderate pain or severe pain. 08/20/16   Georganna Skeans, MD    Family History Family History  Problem Relation Age of Onset  . Hypertension Father   . Stroke Father     hemorrhagic, deceased  . Diabetes Mother   . Uterine cancer Mother   . Coronary artery disease Neg Hx   . Cancer Neg Hx     Social History Social History  Substance Use Topics  . Smoking status: Never Smoker  . Smokeless tobacco: Never Used  . Alcohol use No     Allergies   Patient has no known allergies.   Review of Systems Review of Systems  Constitutional: Negative for chills and fever.  HENT: Negative for ear pain and sore throat.   Eyes: Positive for discharge and redness. Negative for pain and visual disturbance.  Respiratory: Negative for cough and shortness of breath.   Cardiovascular: Negative for chest pain and palpitations.  Gastrointestinal: Negative for abdominal pain and vomiting.  Genitourinary: Negative for dysuria and hematuria.  Musculoskeletal: Negative for arthralgias and back pain.  Skin: Negative for color change and rash.  Neurological: Negative for seizures, syncope and headaches.  All other systems reviewed and are negative.    Physical Exam Updated Vital Signs BP (!) 157/101   Pulse 67   Temp 98.1 F (36.7 C) (Oral)   Resp 18   LMP 11/12/2011   SpO2 98%    Physical Exam  Constitutional: She is oriented to person, place, and time. She appears well-developed and well-nourished. No distress.  HENT:  Head: Normocephalic and atraumatic.  Right Ear: External ear normal.  Left Ear: External ear normal.  Nose: Nose normal.  Eyes: EOM are normal. Left conjunctiva is injected. Left conjunctiva has no hemorrhage. No scleral icterus. Right eye exhibits normal extraocular motion. Left eye exhibits normal extraocular motion.  No proptosis, surrounding erythema, or discharge.  Neck: Normal range of motion and phonation normal.  Cardiovascular: Normal rate and regular rhythm.   Pulmonary/Chest: Effort normal. No stridor. No respiratory distress.  Abdominal: She exhibits no distension.  Musculoskeletal: Normal range of motion. She exhibits no edema.  Neurological: She is alert and oriented to person, place,  and time.  Skin: She is not diaphoretic.  Psychiatric: She has a normal mood and affect. Her behavior is normal.  Vitals reviewed.    ED Treatments / Results  Labs (all labs ordered are listed, but only abnormal results are displayed) Labs Reviewed - No data to display  EKG  EKG Interpretation None       Radiology No results found.  Procedures Procedures (including critical care time)  Medications Ordered in ED Medications  erythromycin ophthalmic ointment 1 application (1 application Left Eye Given 10/07/16 0533)     Initial Impression / Assessment and Plan / ED Course  I have reviewed the triage vital signs and the nursing notes.  Pertinent labs & imaging results that were available during my care of the patient were reviewed by me and considered in my medical decision making (see chart for details).     Viral vs bacterial conjunctivitis. No evidence suggesting preseptal or orbital cellulitis. Will treat with Abx ointment and have pt follow up with PCP.    Final Clinical Impressions(s) / ED Diagnoses   Final diagnoses:   Acute conjunctivitis of left eye, unspecified acute conjunctivitis type   Disposition: Discharge  Condition: Good  I have discussed the results, Dx and Tx plan with the patient who expressed understanding and agree(s) with the plan. Discharge instructions discussed at great length. The patient was given strict return precautions who verbalized understanding of the instructions. No further questions at time of discharge.    New Prescriptions   No medications on file    Follow Up: Primary care provider   If symptoms do not improve or  worsen, in 3-5 days      Fatima Blank, MD 10/07/16 [phone]

## 2016-10-07 NOTE — ED Triage Notes (Signed)
Pt was sleeping and woke up with left eye pain and swelling. Pt denies any foreign objects in eye. Pt endorses blurred vision as well. Eye redness noted and clear drainage. Pt hypertensive in triage.

## 2016-10-12 ENCOUNTER — Encounter (INDEPENDENT_AMBULATORY_CARE_PROVIDER_SITE_OTHER): Payer: Self-pay

## 2016-10-12 ENCOUNTER — Encounter: Payer: Self-pay | Admitting: Family Medicine

## 2016-10-12 ENCOUNTER — Ambulatory Visit (INDEPENDENT_AMBULATORY_CARE_PROVIDER_SITE_OTHER): Payer: No Typology Code available for payment source | Admitting: Family Medicine

## 2016-10-12 VITALS — BP 138/96 | HR 88 | Temp 98.2°F | Wt 162.5 lb

## 2016-10-12 DIAGNOSIS — J0110 Acute frontal sinusitis, unspecified: Secondary | ICD-10-CM | POA: Diagnosis not present

## 2016-10-12 DIAGNOSIS — I10 Essential (primary) hypertension: Secondary | ICD-10-CM

## 2016-10-12 DIAGNOSIS — E119 Type 2 diabetes mellitus without complications: Secondary | ICD-10-CM | POA: Diagnosis not present

## 2016-10-12 DIAGNOSIS — E785 Hyperlipidemia, unspecified: Secondary | ICD-10-CM | POA: Diagnosis not present

## 2016-10-12 DIAGNOSIS — E039 Hypothyroidism, unspecified: Secondary | ICD-10-CM | POA: Diagnosis not present

## 2016-10-12 DIAGNOSIS — E1169 Type 2 diabetes mellitus with other specified complication: Secondary | ICD-10-CM | POA: Insufficient documentation

## 2016-10-12 DIAGNOSIS — R7989 Other specified abnormal findings of blood chemistry: Secondary | ICD-10-CM | POA: Diagnosis not present

## 2016-10-12 DIAGNOSIS — E118 Type 2 diabetes mellitus with unspecified complications: Secondary | ICD-10-CM | POA: Insufficient documentation

## 2016-10-12 DIAGNOSIS — I6350 Cerebral infarction due to unspecified occlusion or stenosis of unspecified cerebral artery: Secondary | ICD-10-CM

## 2016-10-12 DIAGNOSIS — J019 Acute sinusitis, unspecified: Secondary | ICD-10-CM | POA: Insufficient documentation

## 2016-10-12 LAB — LIPID PANEL
CHOLESTEROL: 228 mg/dL — AB (ref 0–200)
HDL: 35.6 mg/dL — AB (ref >39.00)
Total CHOL/HDL Ratio: 6
Triglycerides: 482 mg/dL — ABNORMAL HIGH (ref 0.0–149.0)

## 2016-10-12 LAB — BASIC METABOLIC PANEL
BUN: 15 mg/dL (ref 6–23)
CALCIUM: 9.4 mg/dL (ref 8.4–10.5)
CO2: 29 meq/L (ref 19–32)
Chloride: 101 mEq/L (ref 96–112)
Creatinine, Ser: 0.75 mg/dL (ref 0.40–1.20)
GFR: 88.44 mL/min (ref >60.00)
GLUCOSE: 109 mg/dL — AB (ref 70–99)
Potassium: 3.9 mEq/L (ref 3.5–5.1)
SODIUM: 137 meq/L (ref 135–145)

## 2016-10-12 LAB — TSH: TSH: 9.01 u[IU]/mL — ABNORMAL HIGH (ref 0.35–4.50)

## 2016-10-12 LAB — HEMOGLOBIN A1C: Hgb A1c MFr Bld: 6.1 % (ref 4.6–6.5)

## 2016-10-12 LAB — LDL CHOLESTEROL, DIRECT: Direct LDL: 90 mg/dL

## 2016-10-12 MED ORDER — LISINOPRIL-HYDROCHLOROTHIAZIDE 20-25 MG PO TABS
1.0000 | ORAL_TABLET | Freq: Every day | ORAL | 3 refills | Status: DC
Start: 2016-10-12 — End: 2017-01-11

## 2016-10-12 MED ORDER — METFORMIN HCL ER 500 MG PO TB24: 500.0000 mg | ORAL_TABLET | Freq: Every day | ORAL | 3 refills | Status: DC

## 2016-10-12 MED ORDER — CYCLOBENZAPRINE HCL 5 MG PO TABS: 5.0000 mg | ORAL_TABLET | Freq: Three times a day (TID) | ORAL | 1 refills | Status: DC | PRN

## 2016-10-12 MED ORDER — LEVOTHYROXINE SODIUM 200 MCG PO TABS: 200.0000 ug | ORAL_TABLET | Freq: Every day | ORAL | 3 refills | Status: DC

## 2016-10-12 MED ORDER — DICLOFENAC SODIUM 75 MG PO TBEC: 75.0000 mg | DELAYED_RELEASE_TABLET | Freq: Two times a day (BID) | ORAL | 1 refills | Status: DC | PRN

## 2016-10-12 MED ORDER — AMOXICILLIN-POT CLAVULANATE 875-125 MG PO TABS: 1.0000 | ORAL_TABLET | Freq: Two times a day (BID) | ORAL | 0 refills | Status: AC

## 2016-10-12 NOTE — Assessment & Plan Note (Signed)
Anticipate bacterial given progression and duration of symptoms - treat with augmentin antibiotic course.

## 2016-10-12 NOTE — Assessment & Plan Note (Signed)
Chronic, mildly elevated today. Refilled lisinopril hctz.

## 2016-10-12 NOTE — Progress Notes (Signed)
Pre visit review using our clinic review tool, if applicable. No additional management support is needed unless otherwise documented below in the visit note. 

## 2016-10-12 NOTE — Assessment & Plan Note (Signed)
Update TSH. Continue levothyroxine 216mcg daily.

## 2016-10-12 NOTE — Assessment & Plan Note (Signed)
Update FLP. Off statin.

## 2016-10-12 NOTE — Assessment & Plan Note (Signed)
H/o L globus pallidus infarct 2008, no residual. Not on aspirin or statin. Further discuss at [place]

## 2016-10-12 NOTE — Progress Notes (Signed)
BP (!) 138/96 Comment: No meds this AM  Pulse 88   Temp 98.2 F (36.8 C) (Oral)   Wt 162 lb 8 oz (73.7 kg)   LMP 11/12/2011   BMI 29.72 kg/m    CC: ER f/u visit Subjective:    Patient ID: [name], female    DOB: [birth year], 46 y.o.   MRN: [id number]  HPI: [name] is a 46 y.o. female presenting on 10/12/2016 for Follow-up (ER; also fasting for labs if needed) and Check thyroid (very fatigued)   Last seen by me 2015, saw Regina 2016. Lost insurance.  Recent UCC 1/27 then ER 2/4 evaluation for influenza like illness then acute L sided viral vs bacterial conjunctivitis. Treated with codeine cough syrup and antibiotic ointment. Overall improving. Persistent floaters and watering of left eye. No photopsia. Persistent head congestion, ST, hoarseness, cough, ongoing fatigue. Purulent and some bloody nasal congestion along with PNDrainage and frontal pressure headache. Illness started 2+ wks ago.   Hypothyroidism - compliant with levothyroxine.  Lab Results  Component Value Date   TSH 1.80 06/07/2014   HTN - Compliant with current antihypertensive regimen of lisinopril hctz 20/25mg . Does check blood pressures at home: high [id number] systolic. No low blood pressure readings or symptoms of dizziness/syncope. Denies HA, vision changes, CP/tightness, SOB, leg swelling.   DM - compliant with metformin 500mg  daily XR.   Relevant past medical, surgical, family and social history reviewed and updated as indicated. Interim medical history since our last visit reviewed. Allergies and medications reviewed and updated. Current Outpatient Prescriptions on File Prior to Visit  Medication Sig  . [DISCONTINUED] omeprazole (PRILOSEC) 40 MG capsule Take 1 capsule (40 mg total) by mouth daily. (Patient not taking: Reported on 10/15/2014)  . [DISCONTINUED] sucralfate (CARAFATE) 1 GM/10ML suspension Take 10 mLs (1 g total) by mouth 3 (three) times daily before meals. (Patient not taking: Reported on  10/15/2014)   No current facility-administered medications on file prior to visit.     Review of Systems Per HPI unless specifically indicated in ROS section     Objective:    BP (!) 138/96 Comment: No meds this AM  Pulse 88   Temp 98.2 F (36.8 C) (Oral)   Wt 162 lb 8 oz (73.7 kg)   LMP 11/12/2011   BMI 29.72 kg/m   Wt Readings from Last 3 Encounters:  10/12/16 162 lb 8 oz (73.7 kg)  08/20/16 165 lb (74.8 kg)  05/14/16 161 lb 11.2 oz (73.3 kg)    Physical Exam  Constitutional: She appears well-developed and well-nourished. No distress.  HENT:  Head: Normocephalic and atraumatic.  Right Ear: Hearing, tympanic membrane, external ear and ear canal normal.  Left Ear: Hearing, tympanic membrane, external ear and ear canal normal.  Nose: Mucosal edema (nasal mucosal congestion L>R) present. No rhinorrhea. Right sinus exhibits frontal sinus tenderness. Right sinus exhibits no maxillary sinus tenderness. Left sinus exhibits frontal sinus tenderness. Left sinus exhibits no maxillary sinus tenderness.  Mouth/Throat: Uvula is midline, oropharynx is clear and moist and mucous membranes are normal. No oropharyngeal exudate, posterior oropharyngeal edema, posterior oropharyngeal erythema or tonsillar abscesses.  Eyes: Conjunctivae and EOM are normal. Pupils are equal, round, and reactive to light. No scleral icterus.  Neck: Normal range of motion. Neck supple.  Cardiovascular: Normal rate, regular rhythm, normal heart sounds and intact distal pulses.   No murmur heard. Pulmonary/Chest: Effort normal and breath sounds normal. No respiratory distress. She has no wheezes. She has no rales.  Lymphadenopathy:    She has no cervical adenopathy.  Skin: Skin is warm and dry. No rash noted.  Nursing note and vitals reviewed.  Results for orders placed or performed during the hospital encounter of [id number]  Basic metabolic panel  Result Value Ref Range   Sodium 136 135 - 145 mmol/L   Potassium  4.7 3.5 - 5.1 mmol/L   Chloride 102 101 - 111 mmol/L   CO2 24 22 - 32 mmol/L   Glucose, Bld 99 65 - 99 mg/dL   BUN 15 6 - 20 mg/dL   Creatinine, Ser 0.94 0.44 - 1.00 mg/dL   Calcium 9.4 8.9 - 10.3 mg/dL   GFR calc non Af Amer >60 >60 mL/min   GFR calc Af Amer >60 >60 mL/min   Anion gap 10 5 - 15  Glucose, capillary  Result Value Ref Range   Glucose-Capillary 139 (H) 65 - 99 mg/dL      Assessment & Plan:   Problem List Items Addressed This Visit    Acute sinusitis - Primary    Anticipate bacterial given progression and duration of symptoms - treat with augmentin antibiotic course.       Relevant Medications   amoxicillin-clavulanate (AUGMENTIN) 875-125 MG tablet   Cerebral artery occlusion with cerebral infarction [facility])    H/o L globus pallidus infarct 2008, no residual. Not on aspirin or statin. Further discuss at [facility]      Relevant Medications   lisinopril-hydrochlorothiazide (PRINZIDE,ZESTORETIC) 20-25 MG tablet   Controlled type 2 diabetes mellitus without complication, without long-term current use of insulin (HCC)    Refilled metformin 500mg  XR daily. Update labs today.       Relevant Medications   lisinopril-hydrochlorothiazide (PRINZIDE,ZESTORETIC) 20-25 MG tablet   metFORMIN (GLUCOPHAGE-XR) 500 MG 24 hr tablet   Other Relevant Orders   Hemoglobin [id number]   Basic metabolic panel   HLD (hyperlipidemia)    Update FLP. Off statin.       Relevant Medications   lisinopril-hydrochlorothiazide (PRINZIDE,ZESTORETIC) 20-25 MG tablet   Other Relevant Orders   Lipid panel   HYPERTENSION, BENIGN SYSTEMIC    Chronic, mildly elevated today. Refilled lisinopril hctz.       Relevant Medications   lisinopril-hydrochlorothiazide (PRINZIDE,ZESTORETIC) 20-25 MG tablet   Hypothyroidism    Update TSH. Continue levothyroxine 254mcg daily.       Relevant Medications   levothyroxine (SYNTHROID, LEVOTHROID) 200 MCG tablet   Other Relevant Orders   TSH       Follow up  plan: Return in about 3 months (around 01/09/2017) for annual exam, prior fasting for blood work.  Ria Bush, MD

## 2016-10-12 NOTE — Patient Instructions (Addendum)
Pase por el laboratorio para chequeo de [place].  Para sinusitis - tome antibiotico (augmentin) por 10 dias dos veces al dia con cominda.  [employer] agua, puede usar mucinex para mobilizar flema.  He rellenado medicamentos a [employer].  regresar en 3 meses para examen fisico. Gusto verla hoy, llamenos con preguntas   Sinusitis en adultos (Sinusitis, Adult) La sinusitis es la inflamacin y Conservation officer, historic buildings en los senos paranasales. Los senos paranasales son espacios vacos en los huesos alrededor del rostro. Los senos paranasales se encuentran ubicados:  Alrededor de los ojos.  En la mitad de la frente.  Detrs de Mudlogger.  En los pmulos. Los senos y las fosas nasales estn cubiertos de un lquido fibroso (mucosidad). Normalmente, la mucosidad drena a travs de los senos. Cuando los tejidos nasales se inflaman o hinchan, la mucosidad puede quedar atrapada o bloqueada, de modo que no puede fluir por los senos paranasales. Esto fomenta la proliferacin de bacterias, virus y hongos, lo que produce infecciones. La sinusitis puede desarrollarse rpidamente y durar entre 7 y 10das (aguda) o ms de 12das (crnica). A menudo, esta afeccin surge despus de un resfriado. CAUSAS Esta afeccin es causada por cualquier sustancia que inflame los senos o evite que la mucosidad drene, por ejemplo:  Alergias.  Asma.  Infecciones virales o bacterianas.  Huesos con forma [employer] las fosas nasales.  Crecimientos nasales que contienen mucosidad (plipos nasales).  Aberturas sinusales estrechas.  Agentes contaminantes, como sustancias qumicas o irritantes presentes en el aire.  Un cuerpo extrao atorado [employer].  Infecciones por hongos. Esto es raro. [place] hacer que usted sea propenso a sufrir esta afeccin:  Product manager o asma.  Haber tenido una infeccin reciente en las vas respiratorias superiores o un resfriado.  Tener  deformidades estructurales o bloqueos en la nariz o los senos.  Tener un sistema inmunitario dbil.  Nadar o bucear mucho.  Abusar de los [employer].  Fumar. SNTOMAS Los principales sntomas de esta afeccin son dolor y sensacin de presin alrededor de los senos afectados. Otros sntomas pueden ser los siguientes:  Dolor en los dientes superiores.  Dolor de odos.  Dolor de [place].  Mal aliento.  Disminucin del sentido del olfato y del gusto.  Tos que empeora por la noche.  Fatiga.  Cristy Hilts.  Mucosidad espesa que sale de la Lawyer. Generalmente, es de color verde y puede contener pus (purulento).  Nariz tapada o congestin nasal.  Goteo posnasal. Esto ocurre cuando se acumula mucosidad adicional en la garganta o la parte de atrs de la Lawyer.  Hinchazn y calor en los senos paranasales afectados.  Dolor de Investment banker, operational.  Sensibilidad a Naval architect. DIAGNSTICO Esta enfermedad se diagnostica en funcin de los sntomas, los antecedentes mdicos y un examen fsico. Para descubrir si su afeccin es aguda o crnica, el mdico podra hacer lo siguiente:  Revisar su nariz en busca de plipos nasales.  Palpar los senos paranasales afectados para buscar signos de infeccin.  Observar la parte interna de los senos paranasales con un dispositivo que tiene una luz (endoscopio). Si el mdico sospecha que usted padece sinusitis crnica, podra indicarle lo siguiente:  Pruebas de alergias.  Una muestra de mucosidad de la nariz (cultivo nasal) para buscar bacterias.  Examen de [place] de mucosidad, para ver si la sinusitis se relaciona con alguna alergia. Si la sinusitis no responde al tratamiento y dura ms de 8semanas, se le podra pedir [employer]  magntica o una tomografa computarizada para examinar los senos paranasales. Estos estudios tambin ayudan a Office manager gravedad de la infeccin. En contadas ocasiones, se puede ordenar una biopsia de hueso para  descartar tipos ms graves de infecciones por hongos en los senos paranasales. TRATAMIENTO El tratamiento para la sinusitis depende de la causa y de si la afeccin es [place]. Si lo que causa la sinusitis es un virus, los sntomas desparecern por s solos en el trmino de 10das. Podran recetarle medicamentos para [employer], entre los que se incluyen los siguientes:  Descongestivos nasales tpicos. [place] y permiten que la mucosidad drene por los senos paranasales.  Antihistamnicos. Este tipo de medicamento bloquea la inflamacin que ocasionan las [place]. Pueden ayudar a reducir la inflamacin en la nariz y los senos.  Corticoides nasales tpicos. Son aerosoles nasales que reducen la inflamacin e hinchazn en la Doran Durand y los senos.  Lavados nasales con solucin salina. Estos enjuagues pueden ayudar a eliminar la mucosidad espesa en la nariz. Si la afeccin es causada por una bacteria, se le recetarn antibiticos. Si es causada por un hongo, se le recetarn antimicticos. Se podra necesitar ciruga para tratar enfermedades preexistentes, como las fosas nasales estrechas. Tambin podra ser necesaria para eliminar plipos. [place], use o aplquese los medicamentos de venta libre y Editor, commissioning como se lo haya indicado el mdico. Estos pueden incluir aerosoles nasales.  Si le recetaron un antibitico, tmelo como se lo haya indicado el mdico. No deje de tomar los antibiticos aunque comience a Sports administrator. Hidrtese y [place].   Beba suficiente agua para mantener la orina clara o de color amarillo plido. Mantenerse hidratado lo ayudar a [employer].  Use un humidificador de vapor fro para mantener la humedad de su hogar por encima del 50%.  Realice inhalaciones de vapor por 10 a 43minutos, de 3 a 4veces al da o tal como se lo haya indicado el  mdico. Puede hacer esto en el bao con el vapor del agua caliente de la ducha.  Limite la exposicin al aire fro o seco. Reposo   Descanse todo lo que pueda.  Duerma con la cabeza elevada.  Asegrese de dormir lo suficiente cada noche. Instrucciones generales   Aplquese un pao tibio y hmedo en la cara 3 o 4veces al da o como se lo haya indicado el mdico. Esto ayuda a Actor las [place].  Lvese las manos frecuentemente con agua y jabn para reducir la exposicin a virus y otras bacterias. Use desinfectante para manos si no dispone de [place] y [place].  No fume. Evite estar cerca de personas que fuman (fumador pasivo).  Concurra a todas las visitas de control como se lo haya indicado el mdico. Esto es importante. SOLICITE ATENCIN MDICA SI:  Jaclynn Guarneri.  Los sntomas empeoran.  Los sntomas no mejoran en el trmino de 10das. SOLICITE ATENCIN MDICA DE INMEDIATO SI:  Tiene un dolor de cabeza intenso.  Tiene vmitos persistentes.  Tiene dolor o hinchazn en la zona del rostro o los ojos.  Tiene problemas de visin.  Se siente confundido.  Tiene el cuello rgido.  Tiene dificultad para respirar. Esta informacin no tiene Marine scientist el consejo del mdico. Asegrese de hacerle al mdico cualquier pregunta que tenga. Document Released: 05/30/2005 Document Revised: 12/12/2015 Document Reviewed: 06/15/2015 Elsevier Interactive Patient Education  2017 [employer].

## 2016-10-12 NOTE — Assessment & Plan Note (Signed)
Refilled metformin 500mg  XR daily. Update labs today.

## 2016-10-14 ENCOUNTER — Other Ambulatory Visit: Payer: Self-pay | Admitting: Family Medicine

## 2016-10-14 MED ORDER — FENOFIBRATE 145 MG PO TABS: 145.0000 mg | ORAL_TABLET | Freq: Every day | ORAL | 3 refills | Status: DC

## 2016-10-14 MED ORDER — LEVOTHYROXINE SODIUM 112 MCG PO TABS: 224.0000 ug | ORAL_TABLET | Freq: Every day | ORAL | 3 refills | Status: DC

## 2016-11-16 ENCOUNTER — Ambulatory Visit (HOSPITAL_COMMUNITY)
Admission: EM | Admit: 2016-11-16 | Discharge: 2016-11-16 | Disposition: A | Discharge status: Nursing Home (Includes ICF) | Payer: No Typology Code available for payment source

## 2016-11-16 ENCOUNTER — Encounter (HOSPITAL_COMMUNITY): Payer: Self-pay

## 2016-11-16 ENCOUNTER — Emergency Department (HOSPITAL_COMMUNITY): Payer: No Typology Code available for payment source

## 2016-11-16 ENCOUNTER — Emergency Department (HOSPITAL_COMMUNITY)
Admission: EM | Admit: 2016-11-16 | Discharge: 2016-11-17 | Disposition: A | Discharge status: Home / Self Care | Payer: No Typology Code available for payment source | Attending: Emergency Medicine | Admitting: Emergency Medicine

## 2016-11-16 DIAGNOSIS — J45909 Unspecified asthma, uncomplicated: Secondary | ICD-10-CM | POA: Insufficient documentation

## 2016-11-16 DIAGNOSIS — E119 Type 2 diabetes mellitus without complications: Secondary | ICD-10-CM | POA: Insufficient documentation

## 2016-11-16 DIAGNOSIS — E039 Hypothyroidism, unspecified: Secondary | ICD-10-CM | POA: Insufficient documentation

## 2016-11-16 DIAGNOSIS — R079 Chest pain, unspecified: Secondary | ICD-10-CM | POA: Diagnosis present

## 2016-11-16 DIAGNOSIS — I10 Essential (primary) hypertension: Secondary | ICD-10-CM | POA: Insufficient documentation

## 2016-11-16 DIAGNOSIS — Z8673 Personal history of transient ischemic attack (TIA), and cerebral infarction without residual deficits: Secondary | ICD-10-CM | POA: Insufficient documentation

## 2016-11-16 DIAGNOSIS — Z7984 Long term (current) use of oral hypoglycemic drugs: Secondary | ICD-10-CM | POA: Insufficient documentation

## 2016-11-16 LAB — CBC
HCT: 41.3 % (ref 36.0–46.0)
Hemoglobin: 13.7 g/dL (ref 12.0–15.0)
MCH: 29.8 pg (ref 26.0–34.0)
MCHC: 33.2 g/dL (ref 30.0–36.0)
MCV: 90 fL (ref 78.0–100.0)
PLATELETS: 151 10*3/uL (ref 150–400)
RBC: 4.59 MIL/uL (ref 3.87–5.11)
RDW: 12.8 % (ref 11.5–15.5)
WBC: 8.4 10*3/uL (ref 4.0–10.5)

## 2016-11-16 LAB — I-STAT TROPONIN, ED: Troponin i, poc: 0 ng/mL (ref 0.00–0.08)

## 2016-11-16 LAB — BASIC METABOLIC PANEL
Anion gap: 12 (ref 5–15)
BUN: 15 mg/dL (ref 6–20)
CO2: 26 mmol/L (ref 22–32)
CREATININE: 0.64 mg/dL (ref 0.44–1.00)
Calcium: 9.3 mg/dL (ref 8.9–10.3)
Chloride: 101 mmol/L (ref 101–111)
GFR calc Af Amer: >60 mL/min (ref >60)
GLUCOSE: 115 mg/dL — AB (ref 65–99)
Potassium: 4.2 mmol/L (ref 3.5–5.1)
SODIUM: 139 mmol/L (ref 135–145)

## 2016-11-16 NOTE — ED Provider Notes (Signed)
[facility] DEPT Provider Note   CSN: [id number] Arrival date & time: 11/16/16  1856  By signing my name below, I, Dolores Hoose, attest that this documentation has been prepared under the direction and in the presence of Dorie Rank, MD . Electronically Signed: Dolores Hoose, Scribe. 11/16/2016. 11:59 PM.  History   Chief Complaint Chief Complaint  Patient presents with  . Chest Pain   The history is provided by the patient. No language interpreter was used.    HPI Comments:  [name] is a 46 y.o. female with pmhx of HTN who presents to the Emergency Department complaining of sudden-onset, constant, mild chest pain which began earlier this morning. She describes her pain as central, but radiating into her right arm and right shoulder. Pt reports associated SOB. No modifying factors indicated. She denies any nausea, vomiting, cough or leg swelling. Pt is a nonsmoker.     Past Medical History:  Diagnosis Date  . ABORTION, SPONTANEOUS 08/18/2007  . Acquired hypothyroidism   . Anemia   . Asthma   . Complication of anesthesia   . 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 Ectopic pregnancy 04/2006   treated with medication  . GERD (gastroesophageal reflux disease)   . Hepatic steatosis 07/2006, 04/2014   on Abd CT done for pain  . Hypertension   . Hyperthyroidism 10/2000   RAI treatment,  multinodular goiter  . IMPAIRED GLUCOSE TOLERANCE 08/18/2007  . Migraine    "had them often; many years" (03/13/2016)  . Ovarian cyst 04/2014  . PONV (postoperative nausea and vomiting)   . Preterm delivery 12/2010   x3, placental abruption, maternal htn, DM  . Type II diabetes mellitus ([place]) dx'd 12/2015    Patient Active Problem List   Diagnosis Date Noted  . Acute sinusitis 10/12/2016  . Controlled type 2 diabetes mellitus without complication, without long-term current use of insulin ([place]) 10/12/2016  . Abdominal pain 03/13/2016  .  Hypokalemia 03/13/2016  . Desmoid tumor of abdomen   . Pain of left scapula 11/12/2013  . Numbness and tingling in right hand 06/17/2012  . Female pelvic pain 05/07/2012  . Anemia 04/03/2011  . MIGRAINE HEADACHE 05/12/2007  . Cerebral artery occlusion with cerebral infarction ([place]) 12/12/2006  . Skin rash 12/11/2006  . Hypothyroidism 10/31/2006  . HLD (hyperlipidemia) 10/31/2006  . Overweight (BMI 25.0-29.9) 10/31/2006  . HYPERTENSION, BENIGN SYSTEMIC 10/31/2006  . Asthma 10/31/2006  . UMBILICAL HERNIA 51/88/4166  . MENSTRUAL CYCLE, IRREGULAR 10/31/2006    Past Surgical History:  Procedure Laterality Date  . ABDOMINAL HYSTERECTOMY  04/24/2012   Procedure: HYSTERECTOMY ABDOMINAL;  Surgeon: Terrance Mass, MD;  Location: [facility];  Service: Gynecology;;  . [place]  . CESAREAN SECTION  2012  . CHOLECYSTECTOMY  1997  . COLONOSCOPY  10/2014   1 hyperplastic polyp, rpt 10 yrs Henrene Pastor)  . ESOPHAGOGASTRODUODENOSCOPY  10/2014   WNL  . EXCISION OF ENDOMETRIOMA N/A 08/20/2016   Procedure: EXCISION OF ENDOMETRIOMA LEFT LOWER QUADRANT;  Surgeon: Georganna Skeans, MD;  Location: [facility];  Service: General;  Laterality: N/A;  . LAPAROSCOPIC HYSTERECTOMY  04/24/2012   Procedure: HYSTERECTOMY TOTAL LAPAROSCOPIC;  Surgeon: Terrance Mass, MD;  Location: [facility];  Service: Gynecology;  Laterality: N/A;  2 1/2 hours OR time.  Dr. Phineas Real to assisting   . LAPAROSCOPIC LYSIS INTESTINAL ADHESIONS  2013  . OVARIAN CYST REMOVAL  2012  . THYROID SURGERY  ~ 2000  goiter removed    OB History    Gravida Para Term Preterm AB Living   5 1   1 3 1    SAB TAB Ectopic Multiple Live Births   3               Home Medications    Prior to Admission medications   Medication Sig Start Date End Date Taking? Authorizing Provider  cyclobenzaprine (FLEXERIL) 5 MG tablet Take 1 tablet (5 mg total) by mouth 3 (three) times daily as needed for muscle spasms. 10/12/16   Ria Bush,  MD  diclofenac (VOLTAREN) 75 MG EC tablet Take 1 tablet (75 mg total) by mouth 2 (two) times daily as needed for moderate pain. 10/12/16   Ria Bush, MD  fenofibrate (TRICOR) 145 MG tablet Take 1 tablet (145 mg total) by mouth daily. 10/14/16   Ria Bush, MD  ibuprofen (ADVIL,MOTRIN) 600 MG tablet Take 1 tablet (600 mg total) by mouth every 6 (six) hours as needed. 11/17/16   Dorie Rank, MD  levothyroxine (SYNTHROID, LEVOTHROID) 112 MCG tablet Take 2 tablets (224 mcg total) by mouth daily before breakfast. 10/14/16   Ria Bush, MD  lisinopril-hydrochlorothiazide (PRINZIDE,ZESTORETIC) 20-25 MG tablet Take 1 tablet by mouth daily. 10/12/16   Ria Bush, MD  metFORMIN (GLUCOPHAGE-XR) 500 MG 24 hr tablet Take 1 tablet (500 mg total) by mouth daily with breakfast. 10/12/16   Ria Bush, MD  omeprazole (PRILOSEC) 20 MG capsule Take 1 capsule (20 mg total) by mouth daily. 11/17/16   Dorie Rank, MD    Family History Family History  Problem Relation Age of Onset  . Hypertension Father   . Stroke Father     hemorrhagic, deceased  . Diabetes Mother   . Uterine cancer Mother   . Coronary artery disease Neg Hx   . Cancer Neg Hx     Social History Social History  Substance Use Topics  . Smoking status: Never Smoker  . Smokeless tobacco: Never Used  . Alcohol use No     Allergies   Patient has no known allergies.   Review of Systems Review of Systems  Respiratory: Positive for shortness of breath. Negative for cough.   Cardiovascular: Positive for chest pain. Negative for leg swelling.  Gastrointestinal: Negative for nausea and vomiting.  All other systems reviewed and are negative.    Physical Exam Updated Vital Signs BP 125/86   Pulse 73   Temp 97.8 F (36.6 C) (Oral)   Resp 11   Ht 5\' 1"  (1.549 m)   Wt 73.5 kg   LMP 11/12/2011   SpO2 97%   BMI 30.61 kg/m   Physical Exam  Constitutional: She appears well-developed and well-nourished. No distress.    HENT:  Head: Normocephalic and atraumatic.  Right Ear: External ear normal.  Left Ear: External ear normal.  Eyes: Conjunctivae are normal. Right eye exhibits no discharge. Left eye exhibits no discharge. No scleral icterus.  Neck: Neck supple. No tracheal deviation present.  Cardiovascular: Normal rate, regular rhythm and intact distal pulses.   Pulmonary/Chest: Effort normal and breath sounds normal. No stridor. No respiratory distress. She has no wheezes. She has no rales.  Abdominal: Soft. Bowel sounds are normal. She exhibits no distension. There is no tenderness. There is no rebound and no guarding.  Musculoskeletal: She exhibits no edema or tenderness.  Neurological: She is alert. She has normal strength. No cranial nerve deficit (no facial droop, extraocular movements intact, no slurred speech) or sensory deficit.  She exhibits normal muscle tone. She displays no seizure activity. Coordination normal.  Skin: Skin is warm and dry. No rash noted.  Psychiatric: She has a normal mood and affect.  Nursing note and vitals reviewed.    ED Treatments / Results  DIAGNOSTIC STUDIES:  Oxygen Saturation is 97% on RA, normal by my interpretation.    COORDINATION OF CARE:  12:05 AM Discussed treatment plan with pt at bedside which includes blood work and pt agreed to plan.  1:59 AM Discussed results of lab work and imaging with patient.    Labs (all labs ordered are listed, but only abnormal results are displayed) Labs Reviewed  BASIC METABOLIC PANEL - Abnormal; Notable for the following:       Result Value   Glucose, Bld 115 (*)    All other components within normal limits  CBC  D-DIMER, QUANTITATIVE (NOT AT [facility])  I-STAT TROPOININ, ED  I-STAT TROPOININ, ED    EKG  EKG Interpretation  Date/Time:  Friday November 16 2016 18:59:46 EDT Ventricular Rate:  96 PR Interval:  126 QRS Duration: 86 QT Interval:  354 QTC Calculation: 447 R Axis:   87 Text Interpretation:  Normal  sinus rhythm Normal ECG No old tracing to compare Confirmed by Mersadez Linden  MD-J, Hermina Barnard [phone]) on 11/17/2016 12:00:34 AM       Radiology Dg Chest 2 View  Result Date: 11/16/2016 CLINICAL DATA:  Chest pain and shortness of breath. EXAM: CHEST  2 VIEW COMPARISON:  05/25/2013 FINDINGS: Low lung volumes are present, causing crowding of the pulmonary vasculature. Accounting for these low lung volumes, heart size is within normal limits and the lungs appear clear. No pleural effusion.  No significant bony abnormality. IMPRESSION: 1.  No significant abnormality identified. 2. Low lung volumes. Electronically Signed   By: Van Clines M.D.   On: 11/16/2016 20:12    Procedures Procedures (including critical care time)  Medications Ordered in ED Medications  ibuprofen (ADVIL,MOTRIN) tablet 600 mg (not administered)     Initial Impression / Assessment and Plan / ED Course  I have reviewed the triage vital signs and the nursing notes.  Pertinent labs & imaging results that were available during my care of the patient were reviewed by me and considered in my medical decision making (see chart for details).   patient is low risk for acute coronary syndrome. Serial troponins were negative. EKG without acute findings.  I doubt that her symptoms are related to acute coronary syndrome  Low risk for PE and d-dimer is negative. I doubt pulmonary embolism.  Symptoms may be related to gastroesophageal reflux. discharge home with Prilosec rx. Follow-up with primary doctor next week  Final Clinical Impressions(s) / ED Diagnoses   Final diagnoses:  Chest pain with low risk for cardiac etiology    New Prescriptions New Prescriptions   IBUPROFEN (ADVIL,MOTRIN) 600 MG TABLET    Take 1 tablet (600 mg total) by mouth every 6 (six) hours as needed.   OMEPRAZOLE (PRILOSEC) 20 MG CAPSULE    Take 1 capsule (20 mg total) by mouth daily.  I personally performed the services described in this documentation, which  was scribed in my presence.  The recorded information has been reviewed and is accurate.     Dorie Rank, MD 11/17/16 [phone]

## 2016-11-16 NOTE — ED Triage Notes (Signed)
Pt states that she woke up this AM with central CP and SOB, denies n/v, radiation to r arm, R shoulder and  neck .

## 2016-11-17 LAB — I-STAT TROPONIN, ED: Troponin i, poc: 0 ng/mL (ref 0.00–0.08)

## 2016-11-17 LAB — D-DIMER, QUANTITATIVE: D-Dimer, Quant: 0.31 ug/mL-FEU (ref 0.00–0.50)

## 2016-11-17 MED ORDER — OMEPRAZOLE 20 MG PO CPDR: 20.0000 mg | DELAYED_RELEASE_CAPSULE | Freq: Every day | ORAL | 0 refills | Status: DC

## 2016-11-17 MED ORDER — IBUPROFEN 600 MG PO TABS: 600.0000 mg | ORAL_TABLET | Freq: Four times a day (QID) | ORAL | 0 refills | Status: DC | PRN

## 2016-11-17 MED ORDER — IBUPROFEN 400 MG PO TABS
600.0000 mg | ORAL_TABLET | Freq: Once | ORAL | Status: AC
  Administered 2016-11-17: 02:00:00 600 mg via ORAL
  Filled 2016-11-17: qty 1

## 2016-11-17 NOTE — Discharge Instructions (Signed)
Take the medications as prescribed, follow-up with your doctor next week, return as needed for worsening symptoms

## 2017-01-05 ENCOUNTER — Other Ambulatory Visit: Payer: Self-pay | Admitting: Family Medicine

## 2017-01-05 DIAGNOSIS — E119 Type 2 diabetes mellitus without complications: Secondary | ICD-10-CM

## 2017-01-05 DIAGNOSIS — E785 Hyperlipidemia, unspecified: Secondary | ICD-10-CM

## 2017-01-05 DIAGNOSIS — E039 Hypothyroidism, unspecified: Secondary | ICD-10-CM

## 2017-01-05 DIAGNOSIS — I10 Essential (primary) hypertension: Secondary | ICD-10-CM

## 2017-01-08 ENCOUNTER — Other Ambulatory Visit (INDEPENDENT_AMBULATORY_CARE_PROVIDER_SITE_OTHER): Payer: No Typology Code available for payment source

## 2017-01-08 DIAGNOSIS — E785 Hyperlipidemia, unspecified: Secondary | ICD-10-CM | POA: Diagnosis not present

## 2017-01-08 DIAGNOSIS — I10 Essential (primary) hypertension: Secondary | ICD-10-CM | POA: Diagnosis not present

## 2017-01-08 DIAGNOSIS — E119 Type 2 diabetes mellitus without complications: Secondary | ICD-10-CM | POA: Diagnosis not present

## 2017-01-08 DIAGNOSIS — E039 Hypothyroidism, unspecified: Secondary | ICD-10-CM | POA: Diagnosis not present

## 2017-01-08 LAB — COMPREHENSIVE METABOLIC PANEL
ALT: 32 U/L (ref 0–35)
AST: 20 U/L (ref 0–37)
Albumin: 3.9 g/dL (ref 3.5–5.2)
Alkaline Phosphatase: 71 U/L (ref 39–117)
BILIRUBIN TOTAL: 0.4 mg/dL (ref 0.2–1.2)
BUN: 23 mg/dL (ref 6–23)
CALCIUM: 9.2 mg/dL (ref 8.4–10.5)
CO2: 28 meq/L (ref 19–32)
CREATININE: 0.73 mg/dL (ref 0.40–1.20)
Chloride: 104 mEq/L (ref 96–112)
GFR: 91.15 mL/min (ref >60.00)
Glucose, Bld: 115 mg/dL — ABNORMAL HIGH (ref 70–99)
Potassium: 3.9 mEq/L (ref 3.5–5.1)
Sodium: 138 mEq/L (ref 135–145)
Total Protein: 6.9 g/dL (ref 6.0–8.3)

## 2017-01-08 LAB — MICROALBUMIN / CREATININE URINE RATIO
CREATININE, U: 188 mg/dL
MICROALB UR: 1 mg/dL (ref 0.0–1.9)
Microalb Creat Ratio: 0.5 mg/g (ref 0.0–30.0)

## 2017-01-08 LAB — LIPID PANEL
CHOL/HDL RATIO: 7
Cholesterol: 207 mg/dL — ABNORMAL HIGH (ref 0–200)
HDL: 31.4 mg/dL — ABNORMAL LOW (ref >39.00)
NonHDL: 175.12
Triglycerides: 304 mg/dL — ABNORMAL HIGH (ref 0.0–149.0)
VLDL: 60.8 mg/dL — ABNORMAL HIGH (ref 0.0–40.0)

## 2017-01-08 LAB — LDL CHOLESTEROL, DIRECT: LDL DIRECT: 115 mg/dL

## 2017-01-08 LAB — T4, FREE: FREE T4: 1.41 ng/dL (ref 0.60–1.60)

## 2017-01-08 LAB — TSH: TSH: 0.05 u[IU]/mL — ABNORMAL LOW (ref 0.35–4.50)

## 2017-01-11 ENCOUNTER — Ambulatory Visit (INDEPENDENT_AMBULATORY_CARE_PROVIDER_SITE_OTHER): Payer: No Typology Code available for payment source | Admitting: Family Medicine

## 2017-01-11 ENCOUNTER — Encounter: Payer: Self-pay | Admitting: Family Medicine

## 2017-01-11 VITALS — BP 132/90 | HR 76 | Temp 97.8°F | Ht 62.25 in | Wt 163.8 lb

## 2017-01-11 DIAGNOSIS — Z0001 Encounter for general adult medical examination with abnormal findings: Secondary | ICD-10-CM | POA: Insufficient documentation

## 2017-01-11 DIAGNOSIS — Z0000 Encounter for general adult medical examination without abnormal findings: Secondary | ICD-10-CM | POA: Insufficient documentation

## 2017-01-11 DIAGNOSIS — R220 Localized swelling, mass and lump, head: Secondary | ICD-10-CM | POA: Diagnosis not present

## 2017-01-11 DIAGNOSIS — I10 Essential (primary) hypertension: Secondary | ICD-10-CM

## 2017-01-11 DIAGNOSIS — Z23 Encounter for immunization: Secondary | ICD-10-CM

## 2017-01-11 DIAGNOSIS — I6350 Cerebral infarction due to unspecified occlusion or stenosis of unspecified cerebral artery: Secondary | ICD-10-CM | POA: Diagnosis not present

## 2017-01-11 DIAGNOSIS — E785 Hyperlipidemia, unspecified: Secondary | ICD-10-CM

## 2017-01-11 DIAGNOSIS — E039 Hypothyroidism, unspecified: Secondary | ICD-10-CM | POA: Diagnosis not present

## 2017-01-11 DIAGNOSIS — N809 Endometriosis, unspecified: Secondary | ICD-10-CM

## 2017-01-11 DIAGNOSIS — N80129 Deep endometriosis of ovary, unspecified ovary: Secondary | ICD-10-CM

## 2017-01-11 DIAGNOSIS — E119 Type 2 diabetes mellitus without complications: Secondary | ICD-10-CM | POA: Diagnosis not present

## 2017-01-11 MED ORDER — METFORMIN HCL ER 500 MG PO TB24: 500.0000 mg | ORAL_TABLET | Freq: Every day | ORAL | 3 refills | Status: DC

## 2017-01-11 MED ORDER — ASPIRIN 81 MG PO TABS: 81.0000 mg | ORAL_TABLET | Freq: Every day | ORAL | Status: AC

## 2017-01-11 MED ORDER — LISINOPRIL-HYDROCHLOROTHIAZIDE 20-25 MG PO TABS: 1.0000 | ORAL_TABLET | Freq: Every day | ORAL | 3 refills | Status: DC

## 2017-01-11 MED ORDER — ATORVASTATIN CALCIUM 40 MG PO TABS: 40.0000 mg | ORAL_TABLET | Freq: Every day | ORAL | 3 refills | Status: DC

## 2017-01-11 MED ORDER — LEVOTHYROXINE SODIUM 200 MCG PO TABS: 200.0000 ug | ORAL_TABLET | Freq: Every day | ORAL | 3 refills | Status: DC

## 2017-01-11 NOTE — Assessment & Plan Note (Signed)
s/p excision 08/2016

## 2017-01-11 NOTE — Assessment & Plan Note (Addendum)
h/o globus pallidus infarct 2008. Start statin. Start aspirin.

## 2017-01-11 NOTE — Assessment & Plan Note (Signed)
Preventative protocols reviewed and updated unless pt declined. Discussed healthy diet and lifestyle.  

## 2017-01-11 NOTE — Addendum Note (Signed)
Addended by: Tammi Sou on: 01/11/2017 10:50 AM   Modules accepted: Orders

## 2017-01-11 NOTE — Patient Instructions (Addendum)
Tdap hoy Su colesterol sigue elevado - tome o fenofinobrato o atorvastatin (el cual yo recetare hoy). No tome los dos juntos.  Siga levothyroxine 299mg diarios.  regresar en 4 meses con laboratorios para revisar colesterol y tyroides.  Gusto verla hoy, llamenos con preguntas.  M[place](Health Maintenance, Female) Un estilo de vida saludable y los cuidados preventivos pueden favorecer considerablemente a la salud y eMusician Pregunte a su mdico cul es el cronograma de exmenes peridicos apropiado para usted. Esta es una buena oportunidad para consultarlo sobre cmo prevenir enfermedades y mPhiladelphiasano. Adems de los controles, hay muchas otras cosas que puede hacer usted mismo. Los expertos han realizado numerosas investigaciones sArvinMeritorcambios en el estilo de vida y las medidas de prevencin que, mChish[place] lo ayudarn a mantenerse sano. Solicite a su mdico ms informacin. EL PESO Y LA DIETA Consuma una dieta saludable.  Asegrese de iFamily Dollar Storesverduras, frutas, productos lcteos de bajo contenido de gDjiboutiy pAdvertising account planner  No consuma muchos alimentos de alto contenido de grasas slidas, azcares agregados o sal.  Realice actividad fsica con regularidad. Esta es una de las prcticas ms importantes que puede hacer por su salud.  La mayora de los adultos deben hacer ejercicio durante al menos 1549mutos por semana. El ejercicio debe aumentar la frecuencia cardaca y prActora transpiracin (ejercicio de inDry Prong  La mayora de los adultos tambin deben haField seismologistjercicios de elongacin al meToysRuseces a la semana. Agregue esto al su plan de ejercicio de intensidad moderada. Mantenga un peso saludable.  El ndice de masa corporal (IUhhs Bedford Medical Centeres una medida que puede utilizarse para identificar posibles problemas de peWildoradoProporciona una estimacin de la grasa corporal basndose en el peso y la altura. Su mdico puede ayudarle  a deRadiation protection practitionerMSimonton Lake a loScientist, forensic maTheatre managern peso saludable.  Para las mujeres de 20aos o ms:  Un IMHarney District Hospitalenor de 18,5 se considera bajo peso.  Un IMSt. Louis Children'S Hospitalntre 18,5 y 24,9 es normal.  Un IMCovington - Amg Rehabilitation Hospitalntre 25 y 29,9 se considera sobrepeso.  Un IMC de 30 o ms se considera obesidad. Observe los niveles de colesterol y lpidos en la sangre.  Debe comenzar a reEnglish as a second language teachere lpidos y coResearch officer, trade unionn la sangre a los 20aos y luego repetirlos cada 5a52aos Es posible que neAutomotive engineeros niveles de colesterol con mayor frecuencia si:  Sus niveles de lpidos y colesterol son altos.  Es mayor de 50aos.  Presenta un alto riesgo de padecer enfermedades cardacas. DETECCIN DE CNCER Cncer de pulmn  Se recomienda realizar exmenes de deteccin de cncer de pulmn a personas adultas entre [id number] que estn en riesgo de deHorticulturist, commerciale pulmn por sus antecedentes de consumo de tabaco.  Se recomienda una tomografa computarizada de baja dosis de los puLiberty Mediaos a las personas que:  Fuman actualmente.  Hayan dejado el hbito en algn momento en los ltimos 15aos.  Hayan fumado durante 30aos un paquete diario. Un paquete-ao equivale a fumar un promedio de un paquete de cigarrillos diario durante un ao.  Los exmenes de deteccin anuales deben continuar hasta que hayan pasado 15aos desde que dej de fumar.  Ya no debern realizarse si tiene un problema de salud que le impida recibir tratamiento para elScience writere pulmn. Cncer de mama  Practique la autoconciencia de la mama. Esto significa reconocer la apariencia normal de sus mamas y cmo las siente.  Tambin  significa realizar autoexmenes regulares de [employer]. Informe a su mdico sobre cualquier cambio, sin importar cun pequeo sea.  Si tiene entre 20 y 71 aos, un mdico debe realizarle un examen clnico de las mamas como parte del examen regular de [place], cada 1 a 3aos.  Si tiene 40aos o  ms, debe Information systems manager clnico de las [employer]. Tambin considere realizarse una [place] todos los [place].  Si tiene antecedentes familiares de cncer de mama, hable con su mdico para someterse a un estudio gentico.  Si tiene alto riesgo de Chief Financial Officer de mama, hable con su mdico para someterse a Public house manager y [employer].  La evaluacin del gen del cncer de mama (BRCA) se recomienda a mujeres que tengan familiares con cnceres relacionados con el BRCA. Los cnceres relacionados con el BRCA incluyen los siguientes:  Mama.  Ovario.  Trompas.  Cnceres de peritoneo.  Los resultados de la evaluacin determinarn la necesidad de asesoramiento gentico y de [place] de BRCA1 y BRCA2. Cncer de cuello del tero El mdico puede recomendarle que se haga pruebas peridicas de deteccin de cncer de los rganos de la pelvis (ovarios, tero y vagina). Estas pruebas incluyen un examen plvico, que abarca controlar si se produjeron cambios microscpicos en la superficie del cuello del tero (prueba de Papanicolaou). Pueden recomendarle que se haga estas pruebas cada 3aos, a partir de los 21aos.  A las mujeres que tienen entre 30 y 65aos, los mdicos pueden recomendarles que se sometan a exmenes plvicos y pruebas de Papanicolaou cada 72aos, o a la prueba de Papanicolaou y el examen plvico en combinacin con estudios de deteccin del virus del papiloma humano (VPH) cada 5aos. Algunos tipos de VPH aumentan el riesgo de Chief Financial Officer de cuello del tero. La prueba para la deteccin del VPH tambin puede realizarse a mujeres de cualquier edad cuyos resultados de la prueba de Papanicolaou no sean claros.  Es posible que otros mdicos no recomienden exmenes de deteccin a mujeres no embarazadas que se consideran sujetos de bajo riesgo de Chief Financial Officer de pelvis y que no tienen sntomas. Pregntele al mdico si un  examen plvico de deteccin es adecuado para usted.  Si ha recibido un tratamiento para Science writer cervical o una enfermedad que podra causar cncer, necesitar realizarse una prueba de Papanicolaou y controles durante al menos 35 aos de concluido el [place]. Si no se ha hecho el Papanicolaou con regularidad, debern volver a evaluarse los factores de riesgo (como tener un nuevo compaero sexual), para Teacher, adult education si debe realizarse los estudios nuevamente. Algunas mujeres sufren problemas mdicos que aumentan la probabilidad de Museum/gallery curator cncer de cuello del tero. En estos casos, el mdico podr [employer] se realicen controles y pruebas de Papanicolaou con ms frecuencia. Cncer colorrectal  Este tipo de cncer puede detectarse y a menudo prevenirse.  Por lo general, los estudios de rutina se deben Medical laboratory scientific officer a Field seismologist a Proofreader de los 41 aos y Madelia 74 aos.  Sin embargo, el mdico podr aconsejarle que lo haga antes, si tiene factores de riesgo para el cncer de colon.  Tambin puede recomendarle que use un kit de prueba para Hydrologist en la materia fecal.  Es posible que se use una pequea cmara en el extremo de un tubo para examinar directamente el colon (sigmoidoscopia o colonoscopia) a fin de Hydrographic surveyor formas tempranas de cncer colorrectal.  Los exmenes de rutina generalmente comienzan a  los 50aos.  El examen directo del colon se debe repetir cada 5 a 10aos hasta los 75aos. Sin embargo, es posible que se realicen exmenes con mayor frecuencia, si se detectan formas tempranas de plipos precancerosos o pequeos bultos. Cncer de piel  Revise la piel de la cabeza a los pies con regularidad.  Informe a su mdico si aparecen nuevos lunares o los que tiene se modifican, especialmente en su forma y color.  Tambin notifique al mdico si tiene un lunar que es ms grande que el tamao de una goma de lpiz.  Siempre use pantalla solar. Aplique pantalla solar de Kerry Dory y repetida a lo largo del Training and development officer.  Protjase usando mangas y [employer], un sombrero de ala ancha y gafas para el sol, siempre que se encuentre en el exterior. ENFERMEDADES CARDACAS, DIABETES E HIPERTENSIN ARTERIAL  La hipertensin arterial causa enfermedades cardacas y [place] el riesgo de ictus. La hipertensin arterial es ms probable en los siguientes casos:  Las personas que tienen la presin arterial en el extremo del rango normal (100-139/85-89 mm Hg).  Las personas con sobrepeso u obesidad.  Las Retail banker.  Si usted tiene entre 18 y 39 aos, debe medirse la presin arterial cada 3 a 5 aos. Si usted tiene 40 aos o ms, debe medirse la presin arterial [employer]. Debe medirse la presin arterial dos veces: una vez cuando est en un hospital o una clnica y la otra vez cuando est en otro sitio. Registre el promedio de [employer]. Para controlar su presin arterial cuando no est en un hospital o [place], puede usar lo siguiente:  Ardelia Mems mquina automtica para medir la presin arterial en una farmacia.  Un monitor para medir la presin arterial en el hogar.  Si tiene entre 31 y 62 aos, consulte a su mdico si debe tomar aspirina para prevenir el ictus.  Realcese exmenes de deteccin de la diabetes con regularidad. Esto incluye la toma de [place] de sangre para controlar el nivel de azcar en la sangre durante el [place].  Si tiene un peso normal y un bajo riesgo de padecer diabetes, realcese este anlisis cada tres aos despus de los 45aos.  Si tiene sobrepeso y un alto riesgo de padecer diabetes, considere someterse a este anlisis antes o con mayor frecuencia. PREVENCIN DE INFECCIONES HepatitisB  Si tiene un riesgo ms alto de Museum/gallery curator hepatitis B, debe someterse a un examen de deteccin de este virus. Se considera que tiene un alto riesgo de Museum/gallery curator hepatitis B si:  Naci en un pas donde la hepatitis B es frecuente.  Pregntele a su mdico qu pases son considerados de Public affairs consultant.  Sus padres nacieron en un pas de alto riesgo y usted no recibi una vacuna que lo proteja contra la hepatitis B (vacuna contra la hepatitis B).  Menno.  [place] agujas para inyectarse drogas.  Vive con alguien que tiene hepatitis B.  Ha tenido sexo con alguien que tiene hepatitis B.  Recibe tratamiento de hemodilisis.  Toma ciertos medicamentos para el cncer, trasplante de rganos y afecciones autoinmunitarias. Hepatitis C  Se recomienda un anlisis de [place] para:  Todos los que nacieron entre 1945 y [phone].  Todas las personas que tengan un riesgo de haber contrado hepatitis C. Enfermedades de transmisin sexual (ETS).  Debe realizarse pruebas de deteccin de enfermedades de transmisin sexual (ETS), incluidas gonorrea y clamidia si:  Es sexualmente activo y es menor de 24aos.  Es  mayor de 30aos, y Investment banker, operational informa que corre riesgo de tener este tipo de infecciones.  La actividad sexual ha cambiado desde que le hicieron la ltima prueba de deteccin y tiene un riesgo mayor de Best boy clamidia o Radio broadcast assistant. Pregntele al mdico si usted tiene riesgo.  Si no tiene el VIH, pero corre riesgo de infectarse por el virus, se recomienda tomar diariamente un medicamento recetado para evitar la infeccin. Esto se conoce como profilaxis previa a la exposicin. Se considera que est en riesgo si:  Es [place] sexualmente y no [place] preservativos habitualmente o no conoce el estado del VIH de sus Advertising copywriter.  Se inyecta drogas.  Es [place] sexualmente con Ardelia Mems pareja que tiene VIH. Consulte a su mdico para saber si tiene un alto riesgo de infectarse por el VIH. Si opta por comenzar la profilaxis previa a la exposicin, primero debe realizarse anlisis de deteccin del VIH. Luego, le harn anlisis cada 64mses mientras est tomando los medicamentos para la profilaxis previa a la exposicin. [facility]oston [facility] Si  es premenopusica y puede quedar eEldon solicite a su mdico asesoramiento previo a la concepcin.  Si puede quedar embarazada, tome 400 a [id number](mcg) de cido fAnheuser-Busch  Si desea evitar el embarazo, hable con su mdico sobre el control de la natalidad (anticoncepcin). OSTEOPOROSIS Y MENOPAUSIA  La osteoporosis es una enfermedad en la que los huesos pierden los minerales y la fuerza por el avance de la edad. El resultado pueden ser fracturas graves en los hGlen Alpine El riesgo de osteoporosis puede identificarse con uArdelia Memsprueba de densidad sea.  Si tiene 65aos o ms, o si est en riesgo de sufrir osteoporosis y fracturas, pregunte a su mdico si debe someterse a exmenes.  Consulte a su mdico si debe tomar un suplemento de calcio o de vitamina D para reducir el riesgo de osteoporosis.  La menopausia puede presentar ciertos sntomas fsicos y rGaffer  La terapia de reemplazo hormonal puede reducir algunos de estos sntomas y rGaffer Consulte a su mdico para saber si la terapia de reemplazo hormonal es conveniente para usted. INSTRUCCIONES PARA EL CUIDADO EN EL HOGAR  Realcese los estudios de rutina de la salud, dentales y de lPublic librarian  MFord[place]  No consuma ningn producto que contenga tabaco, lo que incluye cigarrillos, tabaco de mHigher education careers advisero cPsychologist, sport and exercise  Si est embarazada, no beba alcohol.  Si est amamantando, reduzca el consumo de alcohol y la frecuencia con la que consume.  Si es mujer y no est embarazada limite el consumo de alcohol a no ms de 1 medida por da. Una medida equivale a 12onzas de cerveza, 5onzas de vino o 1onzas de bebidas alcohlicas de alta graduacin.  No consuma drogas.  No comparta agujas.  Solicite ayuda a su mdico si necesita apoyo o informacin para abandonar las drogas.  Informe a su mdico si a menudo se siente deprimido.  Notifique a su mdico si alguna vez ha sido vctima  de abuso o si no se siente seguro en su hogar. Esta informacin no tiene cMarine scientistel consejo del mdico. Asegrese de hacerle al mdico cualquier pregunta que tenga. Document Released: 08/09/2011 Document Revised: 09/10/2014 Document Reviewed: 05/24/2015 Elsevier Interactive Patient Education  2017 EReynolds [employer]

## 2017-01-11 NOTE — Assessment & Plan Note (Signed)
Chronic, stable. Continue lisinpril hctz.

## 2017-01-11 NOTE — Assessment & Plan Note (Signed)
Did not start fibrate due to cost. Discussed gemfibrozil vs statin. Will start atorvastatin 40mg  daily. She has some left over fenofibrate at home as well. Discussed use one or the other.

## 2017-01-11 NOTE — Assessment & Plan Note (Signed)
She did not increase levothyroxine - will continue 267mcg dose as recent TSH was low. Recheck labs at 64mo f/u visit. Reviewed correct administration of thyroid replacement medication.

## 2017-01-11 NOTE — Assessment & Plan Note (Addendum)
Lymph node vs cyst. Anticipate LN. Tender, bothersome to patient, she feels enlarging. Will refer to gen surg for eval.  ADDENDUM ==> gen surgery declined eval - will see if ENT will agree to evaluate patient.

## 2017-01-11 NOTE — Assessment & Plan Note (Signed)
Chronic, stable. Continue metformin. RTC 4 mo f/u visit.

## 2017-01-11 NOTE — Progress Notes (Addendum)
BP 132/90 (BP Location: Left Arm, Patient Position: Sitting, Cuff Size: Normal)   Pulse 76   Temp 97.8 F (36.6 C) (Oral)   Ht 5' 2.25" (1.581 m)   Wt 163 lb 12 oz (74.3 kg)   LMP 11/12/2011   SpO2 97%   BMI 29.71 kg/m    CC: CPE Subjective:    Patient ID: [name], female    DOB: Jan 04, 1971, 46 y.o.   MRN: [id number]  HPI: [name] is a 46 y.o. female presenting on 01/11/2017 for Annual Exam   See prior labs - we had increased levothyroxine to 219mcg - she did not yet start this dose.  We also had started tricor 145mg  for hypertriglyceridemia - she did not start yet due to cost.   She had endometrioma removed LLQ 08/2016.  Increased allergies noted, treats with singulair.   By the way - lump R occiput present for 7 months, may be enlarging. Tender to palpation, at times causes headaches throughout left side of head.   Preventative: COLONOSCOPY 10/2014 1 hyperplastic polyp, rpt 10 yrs Henrene Pastor) Well woman - s/p hysterectomy 2013 ?fibroid.  Flu shot - not in season Td 2003, Tdap today Seat belt use discussed Sunscreen use discussed. No changing moles on skin.  Non smoker No alcohol   Caffeine: none Lives with husband and 1 daughter (2012) Occupation: stay at home mom, prior was daycare provider Edu: 9th grade Activity: walks daily 1/2-1 hour Diet: good water, fruits/vegetables daily  Relevant past medical, surgical, family and social history reviewed and updated as indicated. Interim medical history since our last visit reviewed. Allergies and medications reviewed and updated. Outpatient Medications Prior to Visit  Medication Sig Dispense Refill  . cyclobenzaprine (FLEXERIL) 5 MG tablet Take 1 tablet (5 mg total) by mouth 3 (three) times daily as needed for muscle spasms. 30 tablet 1  . diclofenac (VOLTAREN) 75 MG EC tablet Take 1 tablet (75 mg total) by mouth 2 (two) times daily as needed for moderate pain. 60 tablet 1  . levothyroxine (SYNTHROID,  LEVOTHROID) 112 MCG tablet Take 2 tablets (224 mcg total) by mouth daily before breakfast. 180 tablet 3  . lisinopril-hydrochlorothiazide (PRINZIDE,ZESTORETIC) 20-25 MG tablet Take 1 tablet by mouth daily. 90 tablet 3  . metFORMIN (GLUCOPHAGE-XR) 500 MG 24 hr tablet Take 1 tablet (500 mg total) by mouth daily with breakfast. 90 tablet 3  . fenofibrate (TRICOR) 145 MG tablet Take 1 tablet (145 mg total) by mouth daily. 90 tablet 3  . ibuprofen (ADVIL,MOTRIN) 600 MG tablet Take 1 tablet (600 mg total) by mouth every 6 (six) hours as needed. 30 tablet 0  . omeprazole (PRILOSEC) 20 MG capsule Take 1 capsule (20 mg total) by mouth daily. 14 capsule 0   No facility-administered medications prior to visit.      Per HPI unless specifically indicated in ROS section below Review of Systems  Constitutional: Negative for activity change, appetite change, chills, fatigue, fever and unexpected weight change.  HENT: Negative for hearing loss.   Eyes: Negative for visual disturbance.  Respiratory: Negative for cough, chest tightness, shortness of breath and wheezing.   Cardiovascular: Negative for chest pain, palpitations and leg swelling.  Gastrointestinal: Negative for abdominal distention, abdominal pain, blood in stool, constipation, diarrhea, nausea and vomiting.  Genitourinary: Negative for difficulty urinating and hematuria.  Musculoskeletal: Negative for arthralgias, myalgias and neck pain.  Skin: Negative for rash.  Neurological: Negative for dizziness, seizures, syncope and headaches.  Hematological: Negative for adenopathy.  Does not bruise/bleed easily.  Psychiatric/Behavioral: Negative for dysphoric mood. The patient is not nervous/anxious.        Objective:    BP 132/90 (BP Location: Left Arm, Patient Position: Sitting, Cuff Size: Normal)   Pulse 76   Temp 97.8 F (36.6 C) (Oral)   Ht 5' 2.25" (1.581 m)   Wt 163 lb 12 oz (74.3 kg)   LMP 11/12/2011   SpO2 97%   BMI 29.71 kg/m   Wt  Readings from Last 3 Encounters:  01/11/17 163 lb 12 oz (74.3 kg)  11/16/16 162 lb (73.5 kg)  10/12/16 162 lb 8 oz (73.7 kg)    Physical Exam  Constitutional: She is oriented to person, place, and time. She appears well-developed and well-nourished. No distress.  HENT:  Head: Normocephalic and atraumatic.  Right Ear: Hearing, tympanic membrane, external ear and ear canal normal.  Left Ear: Hearing, tympanic membrane, external ear and ear canal normal.  Nose: Nose normal.  Mouth/Throat: Uvula is midline, oropharynx is clear and moist and mucous membranes are normal. No oropharyngeal exudate, posterior oropharyngeal edema or posterior oropharyngeal erythema.  Eyes: Conjunctivae and EOM are normal. Pupils are equal, round, and reactive to light. No scleral icterus.  Neck: Normal range of motion. Neck supple. No thyromegaly present.  Cardiovascular: Normal rate, regular rhythm, normal heart sounds and intact distal pulses.   No murmur heard. Pulses:      Radial pulses are 2+ on the right side, and 2+ on the left side.  Pulmonary/Chest: Effort normal and breath sounds normal. No respiratory distress. She has no wheezes. She has no rales.  Abdominal: Soft. Bowel sounds are normal. She exhibits no distension and no mass. There is no tenderness. There is no rebound and no guarding.  Musculoskeletal: Normal range of motion. She exhibits no edema.  Lymphadenopathy:       Head (right side): Occipital (tender, firm and fixed 1cm) adenopathy present. No submental, no submandibular, no tonsillar, no preauricular and no posterior auricular adenopathy present.       Head (left side): No submental, no submandibular, no tonsillar, no preauricular, no posterior auricular and no occipital adenopathy present.    She has no cervical adenopathy.  Neurological: She is alert and oriented to person, place, and time.  CN grossly intact, station and gait intact  Skin: Skin is warm and dry. No rash noted.    Psychiatric: She has a normal mood and affect. Her behavior is normal. Judgment and thought content normal.  Nursing note and vitals reviewed.  Results for orders placed or performed in visit on 01/08/17  Lipid panel  Result Value Ref Range   Cholesterol 207 (H) 0 - 200 mg/dL   Triglycerides 304.0 (H) 0.0 - 149.0 mg/dL   HDL 31.40 (L) >39.00 mg/dL   VLDL 60.8 (H) 0.0 - 40.0 mg/dL   Total CHOL/HDL Ratio 7    NonHDL 175.12   Comprehensive metabolic panel  Result Value Ref Range   Sodium 138 135 - 145 mEq/L   Potassium 3.9 3.5 - 5.1 mEq/L   Chloride 104 96 - 112 mEq/L   CO2 28 19 - 32 mEq/L   Glucose, Bld 115 (H) 70 - 99 mg/dL   BUN 23 6 - 23 mg/dL   Creatinine, Ser 0.73 0.40 - 1.20 mg/dL   Total Bilirubin 0.4 0.2 - 1.2 mg/dL   Alkaline Phosphatase 71 39 - 117 U/L   AST 20 0 - 37 U/L   ALT 32 0 -  35 U/L   Total Protein 6.9 6.0 - 8.3 g/dL   Albumin 3.9 3.5 - 5.2 g/dL   Calcium 9.2 8.4 - 10.5 mg/dL   GFR 91.15 >60.00 mL/min  TSH  Result Value Ref Range   TSH 0.05 (L) 0.35 - 4.50 uIU/mL  T4, free  Result Value Ref Range   Free T4 1.41 0.60 - 1.60 ng/dL  Microalbumin / creatinine urine ratio  Result Value Ref Range   Microalb, Ur 1.0 0.0 - 1.9 mg/dL   Creatinine,U 188.0 mg/dL   Microalb Creat Ratio 0.5 0.0 - 30.0 mg/g  LDL cholesterol, direct  Result Value Ref Range   Direct LDL 115.0 mg/dL      Assessment & Plan:   Problem List Items Addressed This Visit    Cerebral artery occlusion with cerebral infarction ([place])    h/o globus pallidus infarct 2008. Start statin. Start aspirin.       Relevant Medications   lisinopril-hydrochlorothiazide (PRINZIDE,ZESTORETIC) 20-25 MG tablet   atorvastatin (LIPITOR) 40 MG tablet   aspirin 81 MG tablet   Controlled type 2 diabetes mellitus without complication, without long-term current use of insulin (HCC)    Chronic, stable. Continue metformin. RTC 4 mo f/u visit.      Relevant Medications   lisinopril-hydrochlorothiazide  (PRINZIDE,ZESTORETIC) 20-25 MG tablet   metFORMIN (GLUCOPHAGE-XR) 500 MG 24 hr tablet   atorvastatin (LIPITOR) 40 MG tablet   aspirin 81 MG tablet   Endometrioma    s/p excision 08/2016      Health maintenance examination - Primary    Preventative protocols reviewed and updated unless pt declined. Discussed healthy diet and lifestyle.       HLD (hyperlipidemia)    Did not start fibrate due to cost. Discussed gemfibrozil vs statin. Will start atorvastatin 40mg  daily. She has some left over fenofibrate at home as well. Discussed use one or the other.      Relevant Medications   lisinopril-hydrochlorothiazide (PRINZIDE,ZESTORETIC) 20-25 MG tablet   atorvastatin (LIPITOR) 40 MG tablet   aspirin 81 MG tablet   HYPERTENSION, BENIGN SYSTEMIC    Chronic, stable. Continue lisinpril hctz.       Relevant Medications   lisinopril-hydrochlorothiazide (PRINZIDE,ZESTORETIC) 20-25 MG tablet   atorvastatin (LIPITOR) 40 MG tablet   aspirin 81 MG tablet   Hypothyroidism    She did not increase levothyroxine - will continue 241mcg dose as recent TSH was low. Recheck labs at 63mo f/u visit. Reviewed correct administration of thyroid replacement medication.      Relevant Medications   levothyroxine (SYNTHROID, LEVOTHROID) 200 MCG tablet   Mass of occipital region    Lymph node vs cyst. Anticipate LN. Tender, bothersome to patient, she feels enlarging. Will refer to gen surg for eval.  ADDENDUM ==> gen surgery declined eval - will see if ENT will agree to evaluate patient.       Relevant Orders   Ambulatory referral to ENT    Other Visit Diagnoses    Need for Tdap vaccination       Relevant Orders   Tdap vaccine greater than or equal to 7yo IM (Completed)       Follow up plan: Return in about 4 months (around 05/14/2017) for follow up visit.  Ria Bush, MD

## 2017-01-24 NOTE — Addendum Note (Signed)
Addended by: Ria Bush on: 01/24/2017 08:46 AM   Modules accepted: Orders

## 2017-05-14 ENCOUNTER — Ambulatory Visit (INDEPENDENT_AMBULATORY_CARE_PROVIDER_SITE_OTHER): Payer: No Typology Code available for payment source | Admitting: Family Medicine

## 2017-05-14 ENCOUNTER — Encounter: Payer: Self-pay | Admitting: Family Medicine

## 2017-05-14 VITALS — BP 136/82 | HR 89 | Temp 98.3°F | Wt 162.2 lb

## 2017-05-14 DIAGNOSIS — E785 Hyperlipidemia, unspecified: Secondary | ICD-10-CM | POA: Diagnosis not present

## 2017-05-14 DIAGNOSIS — E039 Hypothyroidism, unspecified: Secondary | ICD-10-CM | POA: Diagnosis not present

## 2017-05-14 DIAGNOSIS — E119 Type 2 diabetes mellitus without complications: Secondary | ICD-10-CM | POA: Diagnosis not present

## 2017-05-14 DIAGNOSIS — I6350 Cerebral infarction due to unspecified occlusion or stenosis of unspecified cerebral artery: Secondary | ICD-10-CM

## 2017-05-14 DIAGNOSIS — J029 Acute pharyngitis, unspecified: Secondary | ICD-10-CM | POA: Insufficient documentation

## 2017-05-14 DIAGNOSIS — Z23 Encounter for immunization: Secondary | ICD-10-CM

## 2017-05-14 DIAGNOSIS — I10 Essential (primary) hypertension: Secondary | ICD-10-CM | POA: Diagnosis not present

## 2017-05-14 LAB — LIPID PANEL
Cholesterol: 240 mg/dL — ABNORMAL HIGH (ref 0–200)
HDL: 25.7 mg/dL — AB (ref >39.00)
Total CHOL/HDL Ratio: 9

## 2017-05-14 LAB — HEMOGLOBIN A1C: Hgb A1c MFr Bld: 6 % (ref 4.6–6.5)

## 2017-05-14 LAB — TSH: TSH: 0.08 u[IU]/mL — ABNORMAL LOW (ref 0.35–4.50)

## 2017-05-14 LAB — LDL CHOLESTEROL, DIRECT: Direct LDL: 101 mg/dL

## 2017-05-14 NOTE — Assessment & Plan Note (Signed)
Anticipate viral given short duration. No red flags of bacterial infection. Supportive care reviewed. Ok to get flu shot today.

## 2017-05-14 NOTE — Progress Notes (Signed)
BP 136/82 (BP Location: Left Arm, Patient Position: Sitting, Cuff Size: Normal)   Pulse 89   Temp 98.3 F (36.8 C) (Oral)   Wt 162 lb 4 oz (73.6 kg)   LMP 11/12/2011   SpO2 98%   BMI 29.44 kg/m    CC: 3 mo f/u visit Subjective:    Patient ID: [name], female    DOB: May 09, 1971, 46 y.o.   MRN: [id number]  HPI: [name] is a 46 y.o. female presenting on 05/14/2017 for 4 mo follow-up   Very happy because mother is visiting [place] form [place] - not seen since 12 yrs ago.   2d of sore throat and some body aches with head congestion. No fevers/chills, cough.   Seeing [facility] - concern for residual stroke symptoms with ongoing R occipital headache and nausea/vomiting. Pending head imaging. F/u appt 05/30/2017.   See prior note for details. Last visit we started asprin and statin for h/o CVA, continued metformin in diabetic, and recommended compliance with levothyroxine 251mcg daily.   Saw ENT for R occipital lump - excision was too expensive so she has deferred further eval at this time.e   HLD - compliant with atorvastatin 40mg  daily.   DM - regularly does not check sugars. Compliant with antihyperglycemic regimen which includes: metformin XR 500mg  once daily. Denies low sugars or hypoglycemic symptoms. Denies paresthesias. Last diabetic eye exam DUE.  Pneumovax: DUE. Prevnar: not due. Lab Results  Component Value Date   HGBA1C 6.1 10/12/2016   Diabetic Foot Exam - Simple   No data filed       Relevant past medical, surgical, family and social history reviewed and updated as indicated. Interim medical history since our last visit reviewed. Allergies and medications reviewed and updated. Outpatient Medications Prior to Visit  Medication Sig Dispense Refill  . aspirin 81 MG tablet Take 1 tablet (81 mg total) by mouth daily.    Marland Kitchen atorvastatin (LIPITOR) 40 MG tablet Take 1 tablet (40 mg total) by mouth daily. 90 tablet 3  . cyclobenzaprine (FLEXERIL) 5 MG  tablet Take 1 tablet (5 mg total) by mouth 3 (three) times daily as needed for muscle spasms. 30 tablet 1  . diclofenac (VOLTAREN) 75 MG EC tablet Take 1 tablet (75 mg total) by mouth 2 (two) times daily as needed for moderate pain. 60 tablet 1  . levothyroxine (SYNTHROID, LEVOTHROID) 200 MCG tablet Take 1 tablet (200 mcg total) by mouth daily before breakfast. 90 tablet 3  . lisinopril-hydrochlorothiazide (PRINZIDE,ZESTORETIC) 20-25 MG tablet Take 1 tablet by mouth daily. 90 tablet 3  . metFORMIN (GLUCOPHAGE-XR) 500 MG 24 hr tablet Take 1 tablet (500 mg total) by mouth daily with breakfast. 90 tablet 3  . montelukast (SINGULAIR) 10 MG tablet Take 10 mg by mouth at bedtime.     No facility-administered medications prior to visit.      Per HPI unless specifically indicated in ROS section below Review of Systems     Objective:    BP 136/82 (BP Location: Left Arm, Patient Position: Sitting, Cuff Size: Normal)   Pulse 89   Temp 98.3 F (36.8 C) (Oral)   Wt 162 lb 4 oz (73.6 kg)   LMP 11/12/2011   SpO2 98%   BMI 29.44 kg/m   Wt Readings from Last 3 Encounters:  05/14/17 162 lb 4 oz (73.6 kg)  01/11/17 163 lb 12 oz (74.3 kg)  11/16/16 162 lb (73.5 kg)    Physical Exam  Constitutional: She  appears well-developed and well-nourished. No distress.  HENT:  Head: Normocephalic and atraumatic.  Right Ear: Hearing, tympanic membrane, external ear and ear canal normal.  Left Ear: Hearing, tympanic membrane, external ear and ear canal normal.  Nose: No mucosal edema or rhinorrhea. Right sinus exhibits no maxillary sinus tenderness and no frontal sinus tenderness. Left sinus exhibits no maxillary sinus tenderness and no frontal sinus tenderness.  Mouth/Throat: Uvula is midline and mucous membranes are normal. Posterior oropharyngeal erythema (mild) present. No oropharyngeal exudate, posterior oropharyngeal edema or tonsillar abscesses.  Eyes: Pupils are equal, round, and reactive to light.  Conjunctivae and EOM are normal. No scleral icterus.  Neck: Normal range of motion. Neck supple.  Cardiovascular: Normal rate, regular rhythm, normal heart sounds and intact distal pulses.   No murmur heard. Pulmonary/Chest: Effort normal and breath sounds normal. No respiratory distress. She has no wheezes. She has no rales.  Lymphadenopathy:       Head (right side): Occipital (small) adenopathy present.    She has no cervical adenopathy.  Skin: Skin is warm and dry. No rash noted.  Nursing note and vitals reviewed.  Results for orders placed or performed in visit on 01/08/17  Lipid panel  Result Value Ref Range   Cholesterol 207 (H) 0 - 200 mg/dL   Triglycerides 304.0 (H) 0.0 - 149.0 mg/dL   HDL 31.40 (L) >39.00 mg/dL   VLDL 60.8 (H) 0.0 - 40.0 mg/dL   Total CHOL/HDL Ratio 7    NonHDL 175.12   Comprehensive metabolic panel  Result Value Ref Range   Sodium 138 135 - 145 mEq/L   Potassium 3.9 3.5 - 5.1 mEq/L   Chloride 104 96 - 112 mEq/L   CO2 28 19 - 32 mEq/L   Glucose, Bld 115 (H) 70 - 99 mg/dL   BUN 23 6 - 23 mg/dL   Creatinine, Ser 0.73 0.40 - 1.20 mg/dL   Total Bilirubin 0.4 0.2 - 1.2 mg/dL   Alkaline Phosphatase 71 39 - 117 U/L   AST 20 0 - 37 U/L   ALT 32 0 - 35 U/L   Total Protein 6.9 6.0 - 8.3 g/dL   Albumin 3.9 3.5 - 5.2 g/dL   Calcium 9.2 8.4 - 10.5 mg/dL   GFR 91.15 >60.00 mL/min  TSH  Result Value Ref Range   TSH 0.05 (L) 0.35 - 4.50 uIU/mL  T4, free  Result Value Ref Range   Free T4 1.41 0.60 - 1.60 ng/dL  Microalbumin / creatinine urine ratio  Result Value Ref Range   Microalb, Ur 1.0 0.0 - 1.9 mg/dL   Creatinine,U 188.0 mg/dL   Microalb Creat Ratio 0.5 0.0 - 30.0 mg/g  LDL cholesterol, direct  Result Value Ref Range   Direct LDL 115.0 mg/dL      Assessment & Plan:   Problem List Items Addressed This Visit    Cerebral artery occlusion with cerebral infarction [facility])    Now taking statin and aspirin.       Controlled type 2 diabetes mellitus  without complication, without long-term current use of insulin (HCC)    Chronic, stable. Continue metformin. Check labs today.       Relevant Orders   Hemoglobin A1c   HLD (hyperlipidemia)    Chronic, compliant with lipitor - check FLP today.       Relevant Orders   Lipid panel   HYPERTENSION, BENIGN SYSTEMIC    Chronic, stable. Continue current regimen.      Hypothyroidism  Reports compliance with 217mcg dose. Check labs today.       Relevant Orders   TSH   Viral pharyngitis - Primary    Anticipate viral given short duration. No red flags of bacterial infection. Supportive care reviewed. Ok to get flu shot today.           Follow up plan: Return in about 4 months (around 09/13/2017) for follow up visit.  Ria Bush, MD

## 2017-05-14 NOTE — Assessment & Plan Note (Signed)
Chronic, stable. Continue metformin. Check labs today.

## 2017-05-14 NOTE — Assessment & Plan Note (Signed)
Now taking statin and aspirin.

## 2017-05-14 NOTE — Addendum Note (Signed)
Addended by: Brenton Grills on: 1/46/0479 98:72 AM   Modules accepted: Orders

## 2017-05-14 NOTE — Assessment & Plan Note (Signed)
Chronic, stable. Continue current regimen. 

## 2017-05-14 NOTE — Patient Instructions (Addendum)
vacuna de influenza hoy. creo que tiene infeccion viral de garganta. esto debe mejorar con tiempo, no se necesita antibiotico. [employer], y descanso. puede tomar ibuprofeno or tylenol. dejenos saber si no mejora cada dia. laboratorios hoy.

## 2017-05-14 NOTE — Assessment & Plan Note (Signed)
Reports compliance with 272mcg dose. Check labs today.

## 2017-05-14 NOTE — Assessment & Plan Note (Signed)
Chronic, compliant with lipitor - check FLP today.

## 2017-05-16 ENCOUNTER — Other Ambulatory Visit: Payer: Self-pay | Admitting: Family Medicine

## 2017-05-16 MED ORDER — FENOFIBRATE 145 MG PO TABS: 145.0000 mg | ORAL_TABLET | Freq: Every day | ORAL | 1 refills | Status: DC

## 2017-05-16 MED ORDER — LEVOTHYROXINE SODIUM 175 MCG PO TABS: 175.0000 ug | ORAL_TABLET | Freq: Every day | ORAL | 1 refills | Status: DC

## 2017-06-10 ENCOUNTER — Encounter (HOSPITAL_COMMUNITY): Payer: Self-pay | Admitting: *Deleted

## 2017-06-10 ENCOUNTER — Ambulatory Visit (HOSPITAL_COMMUNITY)
Admission: EM | Admit: 2017-06-10 | Discharge: 2017-06-10 | Disposition: A | Discharge status: Home / Self Care | Payer: No Typology Code available for payment source | Attending: Urgent Care | Admitting: Urgent Care

## 2017-06-10 DIAGNOSIS — L237 Allergic contact dermatitis due to plants, except food: Secondary | ICD-10-CM | POA: Diagnosis not present

## 2017-06-10 DIAGNOSIS — L299 Pruritus, unspecified: Secondary | ICD-10-CM

## 2017-06-10 MED ORDER — HYDROXYZINE HCL 25 MG PO TABS: 25.0000 mg | ORAL_TABLET | Freq: Three times a day (TID) | ORAL | 0 refills | Status: DC | PRN

## 2017-06-10 MED ORDER — PREDNISONE 20 MG PO TABS: ORAL_TABLET | ORAL | 0 refills | Status: DC

## 2017-06-10 NOTE — ED Triage Notes (Signed)
Patient reports rash x 3 days to arms, stomach, and large area to left inner thigh. Patient reports severe itching.

## 2017-06-10 NOTE — ED Provider Notes (Signed)
MRN: [id number] DOB: July 21, 1971  Subjective:   [name] is a 46 y.o. female presenting for chief complaint of Rash  Reports 3 day history of rash that started over her arms, has now spread to her torso and thighs, has severe itching. Symptoms started from doing work in the yard and coming into contact with plants she did not recognize. Denies fever, chest tightness, shob, wheezing, n/v, facial swelling. Has tried otc calamine cream with minimal relief.  No current facility-administered medications for this encounter.   Current Outpatient Prescriptions:  .  aspirin 81 MG tablet, Take 1 tablet (81 mg total) by mouth daily., Disp: , Rfl:  .  atorvastatin (LIPITOR) 40 MG tablet, Take 1 tablet (40 mg total) by mouth daily., Disp: 90 tablet, Rfl: 3 .  cyclobenzaprine (FLEXERIL) 5 MG tablet, Take 1 tablet (5 mg total) by mouth 3 (three) times daily as needed for muscle spasms., Disp: 30 tablet, Rfl: 1 .  diclofenac (VOLTAREN) 75 MG EC tablet, Take 1 tablet (75 mg total) by mouth 2 (two) times daily as needed for moderate pain., Disp: 60 tablet, Rfl: 1 .  fenofibrate (TRICOR) 145 MG tablet, Take 1 tablet (145 mg total) by mouth daily., Disp: 90 tablet, Rfl: 1 .  hydrOXYzine (ATARAX/VISTARIL) 25 MG tablet, Take 1-2 tablets (25-50 mg total) by mouth every 8 (eight) hours as needed., Disp: 60 tablet, Rfl: 0 .  levothyroxine (SYNTHROID, LEVOTHROID) 175 MCG tablet, Take 1 tablet (175 mcg total) by mouth daily before breakfast., Disp: 90 tablet, Rfl: 1 .  lisinopril-hydrochlorothiazide (PRINZIDE,ZESTORETIC) 20-25 MG tablet, Take 1 tablet by mouth daily., Disp: 90 tablet, Rfl: 3 .  metFORMIN (GLUCOPHAGE-XR) 500 MG 24 hr tablet, Take 1 tablet (500 mg total) by mouth daily with breakfast., Disp: 90 tablet, Rfl: 3 .  montelukast (SINGULAIR) 10 MG tablet, Take 10 mg by mouth at bedtime., Disp: , Rfl:  .  predniSONE (DELTASONE) 20 MG tablet, Take 2 tablets daily with breakfast., Disp: 30 tablet, Rfl: 0   [name]  has No Known Allergies.  [name]  has a past medical history of ABORTION, SPONTANEOUS (08/18/2007); Acquired hypothyroidism; Anemia; Asthma; Complication of anesthesia; CVA (cerebral vascular accident) ([place]) (2008); Ectopic pregnancy (04/2006); GERD (gastroesophageal reflux disease); Hepatic steatosis (07/2006, 04/2014); Hypertension; Hyperthyroidism (10/2000); IMPAIRED GLUCOSE TOLERANCE (08/18/2007); Migraine; Ovarian cyst (04/2014); PONV (postoperative nausea and vomiting); Preterm delivery (12/2010); and Type II diabetes mellitus ([place]) (dx'd 12/2015). Also  has a past surgical history that includes Appendectomy (1997); Cholecystectomy (1997); Ovarian cyst removal (2012); Laparoscopic lysis intestinal adhesions (2013); Thyroid surgery (~ 2000); Cesarean section (2012); Laparoscopic hysterectomy (04/24/2012); Abdominal hysterectomy (04/24/2012); Esophagogastroduodenoscopy (10/2014); Colonoscopy (10/2014); and Excision of endometrioma (N/A, 08/20/2016).  Objective:   Vitals: BP (!) 147/93 (BP Location: Right Arm)   Pulse 76   Temp 98.1 F (36.7 C) (Oral)   Resp 16   LMP 11/12/2011   SpO2 100%   Physical Exam  Constitutional: She is oriented to person, place, and time. She appears well-developed and well-nourished.  HENT:  Mouth/Throat: Oropharynx is clear and moist.  Cardiovascular: Normal rate, regular rhythm and intact distal pulses.  Exam reveals no gallop and no friction rub.   No murmur heard. Pulmonary/Chest: No respiratory distress. She has no wheezes. She has no rales.  Neurological: She is alert and oriented to person, place, and time.  Skin: Skin is warm and dry. Rash (multiple urticarial lesions scattered over upper and lower extremities, anterior torso) noted.   Assessment and Plan :   Allergic contact  dermatitis due to plants, except food  Itching  Start 15 day steroid course, hydroxyzine for itching. Return-to-clinic precautions discussed, patient verbalized understanding.   Jaynee Eagles, PA-C [facility]  06/10/2017  4:00 PM    Jaynee Eagles, PA-C 06/12/17 1113

## 2017-06-10 NOTE — Discharge Instructions (Signed)
Dia 1-5: Tome 3 pastillas de Prednisone (60mg  total) con desayuno. Dia 6-10: Tome 2 pastillas de Prednisone (40mg  total) con desayuno. Dia 11-15: Tome 1 pastilla de Prednisone (20mg ) con desayuno.

## 2017-06-12 ENCOUNTER — Encounter (HOSPITAL_COMMUNITY): Payer: Self-pay | Admitting: Urgent Care

## 2017-06-18 ENCOUNTER — Ambulatory Visit (INDEPENDENT_AMBULATORY_CARE_PROVIDER_SITE_OTHER): Payer: No Typology Code available for payment source | Admitting: Family Medicine

## 2017-06-18 ENCOUNTER — Encounter: Payer: Self-pay | Admitting: Family Medicine

## 2017-06-18 VITALS — BP 136/82 | HR 90 | Temp 97.8°F | Wt 164.0 lb

## 2017-06-18 DIAGNOSIS — E039 Hypothyroidism, unspecified: Secondary | ICD-10-CM

## 2017-06-18 DIAGNOSIS — R21 Rash and other nonspecific skin eruption: Secondary | ICD-10-CM

## 2017-06-18 DIAGNOSIS — E785 Hyperlipidemia, unspecified: Secondary | ICD-10-CM

## 2017-06-18 MED ORDER — TRIAMCINOLONE ACETONIDE 0.1 % EX CREA: 1.0000 "application " | TOPICAL_CREAM | Freq: Two times a day (BID) | CUTANEOUS | 0 refills | Status: AC

## 2017-06-18 MED ORDER — DEXAMETHASONE SODIUM PHOSPHATE 10 MG/ML IJ SOLN
10.0000 mg | Freq: Once | INTRAMUSCULAR | Status: AC
  Administered 2017-06-18: 10 mg via INTRAMUSCULAR

## 2017-06-18 MED ORDER — FENOFIBRATE 120 MG PO TABS: 120.0000 mg | ORAL_TABLET | Freq: Every day | ORAL | 1 refills | Status: DC

## 2017-06-18 MED ORDER — LEVOTHYROXINE SODIUM 175 MCG PO TABS: 175.0000 ug | ORAL_TABLET | Freq: Every day | ORAL | 1 refills | Status: DC

## 2017-06-18 NOTE — Progress Notes (Signed)
BP 136/82 (BP Location: Left Arm, Patient Position: Sitting, Cuff Size: Normal)   Pulse 90   Temp 97.8 F (36.6 C) (Oral)   Wt 164 lb (74.4 kg)   LMP 11/12/2011   SpO2 98%   BMI 29.76 kg/m    CC: [facility] UCC f/u visit - rash Subjective:    Patient ID: Caren Macadam, female    DOB: 27-Mar-1971, 46 y.o.   MRN: [id number]  HPI: Carime Dinkel is a 46 y.o. female presenting on 06/18/2017 for [facility] UC follow-up (Visited UC on 10/7 or 10/8 due to rash all over. Given rx to take, still taking. C/o continued itching and areas warm to touch)   Seen 06/10/2017 at [facility] with progressive centripedal rash started arms and extended to torso and thighs, severely itchy. Started after yardwork and contact with unknown plant. Dx allergic contact dermatitis, treated with hydroxyzine and steroid course. Not tolerating meds well - prednisone causing hyperglycemia as well as restless malaise. Endorses nausea and vomiting as well as heat/cold intolerance. No oral lesions.   Rash now extending past thighs into lower legs as well as on breasts. Breaks out in erythematous pruritic patches that develop into hyperpigmented lesions. Prednisone hasn't helped.  Palms and soles spared, face spared.   No new medicines.  She has not started fenofibrate - too expensive $300.   Relevant past medical, surgical, family and social history reviewed and updated as indicated. Interim medical history since our last visit reviewed. Allergies and medications reviewed and updated. Outpatient Medications Prior to Visit  Medication Sig Dispense Refill  . aspirin 81 MG tablet Take 1 tablet (81 mg total) by mouth daily.    Marland Kitchen atorvastatin (LIPITOR) 40 MG tablet Take 1 tablet (40 mg total) by mouth daily. 90 tablet 3  . cyclobenzaprine (FLEXERIL) 5 MG tablet Take 1 tablet (5 mg total) by mouth 3 (three) times daily as needed for muscle spasms. 30 tablet 1  . diclofenac (VOLTAREN) 75 MG EC tablet Take 1 tablet (75 mg total) by mouth 2 (two)  times daily as needed for moderate pain. 60 tablet 1  . hydrOXYzine (ATARAX/VISTARIL) 25 MG tablet Take 1-2 tablets (25-50 mg total) by mouth every 8 (eight) hours as needed. 60 tablet 0  . lisinopril-hydrochlorothiazide (PRINZIDE,ZESTORETIC) 20-25 MG tablet Take 1 tablet by mouth daily. 90 tablet 3  . metFORMIN (GLUCOPHAGE-XR) 500 MG 24 hr tablet Take 1 tablet (500 mg total) by mouth daily with breakfast. 90 tablet 3  . montelukast (SINGULAIR) 10 MG tablet Take 10 mg by mouth at bedtime.    . fenofibrate (TRICOR) 145 MG tablet Take 1 tablet (145 mg total) by mouth daily. 90 tablet 1  . levothyroxine (SYNTHROID, LEVOTHROID) 175 MCG tablet Take 1 tablet (175 mcg total) by mouth daily before breakfast. 90 tablet 1  . predniSONE (DELTASONE) 20 MG tablet Take 2 tablets daily with breakfast. 30 tablet 0   No facility-administered medications prior to visit.      Per HPI unless specifically indicated in ROS section below Review of Systems     Objective:    BP 136/82 (BP Location: Left Arm, Patient Position: Sitting, Cuff Size: Normal)   Pulse 90   Temp 97.8 F (36.6 C) (Oral)   Wt 164 lb (74.4 kg)   LMP 11/12/2011   SpO2 98%   BMI 29.76 kg/m   Wt Readings from Last 3 Encounters:  06/18/17 164 lb (74.4 kg)  05/14/17 162 lb 4 oz (73.6 kg)  01/11/17 163 lb  12 oz (74.3 kg)    Physical Exam  Constitutional: She appears well-developed and well-nourished. No distress.  Skin: Skin is warm and dry. Rash noted. There is erythema.  Warm erythematous intensely pruritic confluent macules on arms and legs and abdomen and chest, RLE excoriations have led to erosions.  Older lesions have developed scaling, hyperpigmentation and skin thickening No oral lesions  Nursing note and vitals reviewed.      Assessment & Plan:   Problem List Items Addressed This Visit    HLD (hyperlipidemia)    Has not yet started fibrate.       Relevant Medications   Fenofibrate 120 MG TABS   Hypothyroidism     She has not filled lower levothyroxine dose 144mcg due to some pharmacy issue - I've refilled this.       Relevant Medications   levothyroxine (SYNTHROID, LEVOTHROID) 175 MCG tablet   Skin rash - Primary    Unclear etiology - ?drug rash, urticarial reaction, allergic contact dermatitis less likely. No significant improvement despite prednisone taper and antihistamine.  Possible contributors include NSAID vs lipitor - rec stop both. Possible etiology lisinopril/hctz - did not stop at this time.  Anticipate malaise and some of her systemic symptoms are likely due to prednisone intolerance - will stop this. Instead treat with dexamethasone 10mg  IM shot today and TCI cream sent to pharmacy. Continue hydroxyzine.  RTC 10d f/u, sooner if worsening, consider derm referral.       Relevant Medications   dexamethasone (DECADRON) injection 10 mg (Completed)       Follow up plan: Return in about 10 days (around 06/28/2017), or if symptoms worsen or fail to improve, for follow up visit.  Ria Bush, MD

## 2017-06-18 NOTE — Assessment & Plan Note (Addendum)
Unclear etiology - ?drug rash, urticarial reaction, allergic contact dermatitis less likely. No significant improvement despite prednisone taper and antihistamine.  Possible contributors include NSAID vs lipitor - rec stop both. Possible etiology lisinopril/hctz - did not stop at this time.  Anticipate malaise and some of her systemic symptoms are likely due to prednisone intolerance - will stop this. Instead treat with dexamethasone 10mg  IM shot today and TCI cream sent to pharmacy. Continue hydroxyzine.  RTC 10d f/u, sooner if worsening, consider derm referral.

## 2017-06-18 NOTE — Assessment & Plan Note (Signed)
Has not yet started fibrate.

## 2017-06-18 NOTE — Assessment & Plan Note (Signed)
She has not filled lower levothyroxine dose 137mcg due to some pharmacy issue - I've refilled this.

## 2017-06-18 NOTE — Patient Instructions (Addendum)
Pare lipitor Pare voltaren Pare prednisona.  Inyeccion de corticosteroide hoy.  Empieze crema topical para rasquia, siga hydroxyzine para rasquia. Regresar en 1-2 semanas para re evaluar. Si empeora antes, la mandaremos a dermatologo.

## 2017-06-28 ENCOUNTER — Ambulatory Visit: Payer: No Typology Code available for payment source | Admitting: Family Medicine

## 2017-07-02 ENCOUNTER — Encounter: Payer: Self-pay | Admitting: Family Medicine

## 2017-07-02 ENCOUNTER — Ambulatory Visit (INDEPENDENT_AMBULATORY_CARE_PROVIDER_SITE_OTHER): Payer: No Typology Code available for payment source | Admitting: Family Medicine

## 2017-07-02 VITALS — BP 136/84 | HR 95 | Temp 97.9°F | Wt 163.2 lb

## 2017-07-02 DIAGNOSIS — R21 Rash and other nonspecific skin eruption: Secondary | ICD-10-CM | POA: Diagnosis not present

## 2017-07-02 DIAGNOSIS — E785 Hyperlipidemia, unspecified: Secondary | ICD-10-CM | POA: Diagnosis not present

## 2017-07-02 DIAGNOSIS — E039 Hypothyroidism, unspecified: Secondary | ICD-10-CM | POA: Diagnosis not present

## 2017-07-02 MED ORDER — ATORVASTATIN CALCIUM 40 MG PO TABS: 40.0000 mg | ORAL_TABLET | Freq: Every day | ORAL | 3 refills | Status: DC

## 2017-07-02 MED ORDER — FENOFIBRATE 67 MG PO CAPS: 67.0000 mg | ORAL_CAPSULE | Freq: Every day | ORAL | 11 refills | Status: DC

## 2017-07-02 NOTE — Progress Notes (Signed)
BP 136/84 (BP Location: Left Arm, Patient Position: Sitting, Cuff Size: Normal)   Pulse 95   Temp 97.9 F (36.6 C) (Oral)   Wt 163 lb 4 oz (74 kg)   LMP 11/12/2011   SpO2 96%   BMI 29.62 kg/m    CC: 10 d f/u visit Subjective:    Patient ID: [name], female    DOB: May 08, 1971, 46 y.o.   MRN: [id number]  HPI: [name] is a 46 y.o. female presenting on 07/02/2017 for 10 day follow-up   See prior note for details. Seen initially at [facility] with progressive rash - thought allergic contact dermatitis treated with prednisone and hydroxyzine - did not tolerate well.  Rash was progressing. I saw her 2 wks ago and treated with dexamethasone 10mg  IM and triamcinolone cream, rec continued hydroxyzine. ?urticarial drug rash. We stopped lipitor and voltaren.   Significant improvement in itching and rash.   Relevant past medical, surgical, family and social history reviewed and updated as indicated. Interim medical history since our last visit reviewed. Allergies and medications reviewed and updated. Outpatient Medications Prior to Visit  Medication Sig Dispense Refill  . aspirin 81 MG tablet Take 1 tablet (81 mg total) by mouth daily.    . hydrOXYzine (ATARAX/VISTARIL) 25 MG tablet Take 1-2 tablets (25-50 mg total) by mouth every 8 (eight) hours as needed. 60 tablet 0  . levothyroxine (SYNTHROID, LEVOTHROID) 175 MCG tablet Take 1 tablet (175 mcg total) by mouth daily before breakfast. 90 tablet 1  . lisinopril-hydrochlorothiazide (PRINZIDE,ZESTORETIC) 20-25 MG tablet Take 1 tablet by mouth daily. 90 tablet 3  . metFORMIN (GLUCOPHAGE-XR) 500 MG 24 hr tablet Take 1 tablet (500 mg total) by mouth daily with breakfast. 90 tablet 3  . montelukast (SINGULAIR) 10 MG tablet Take 10 mg by mouth at bedtime.    . triamcinolone cream (KENALOG) 0.1 % Apply 1 application topically 2 (two) times daily. Apply to AA. 60 g 0  . atorvastatin (LIPITOR) 40 MG tablet Take 1 tablet (40 mg total) by mouth  daily. 90 tablet 3  . cyclobenzaprine (FLEXERIL) 5 MG tablet Take 1 tablet (5 mg total) by mouth 3 (three) times daily as needed for muscle spasms. 30 tablet 1  . diclofenac (VOLTAREN) 75 MG EC tablet Take 1 tablet (75 mg total) by mouth 2 (two) times daily as needed for moderate pain. 60 tablet 1  . Fenofibrate 120 MG TABS Take 1 tablet (120 mg total) by mouth daily. 90 tablet 1   No facility-administered medications prior to visit.      Per HPI unless specifically indicated in ROS section below Review of Systems     Objective:    BP 136/84 (BP Location: Left Arm, Patient Position: Sitting, Cuff Size: Normal)   Pulse 95   Temp 97.9 F (36.6 C) (Oral)   Wt 163 lb 4 oz (74 kg)   LMP 11/12/2011   SpO2 96%   BMI 29.62 kg/m   Wt Readings from Last 3 Encounters:  07/02/17 163 lb 4 oz (74 kg)  06/18/17 164 lb (74.4 kg)  05/14/17 162 lb 4 oz (73.6 kg)    Physical Exam  Constitutional: She appears well-developed and well-nourished. No distress.  Skin: Skin is warm and dry. Rash noted.  Diffuse areas of hyperpigmentation at prior rash sites, now predominantly on thighs and legs  Nursing note and vitals reviewed.  Results for orders placed or performed in visit on 05/14/17  Lipid panel  Result Value Ref  Range   Cholesterol 240 (H) 0 - 200 mg/dL   Triglycerides (H) 0.0 - 149.0 mg/dL    1018.0 Triglyceride is over 400; calculations on Lipids are invalid.   HDL 25.70 (L) >39.00 mg/dL   Total CHOL/HDL Ratio 9   Hemoglobin A1c  Result Value Ref Range   Hgb A1c MFr Bld 6.0 4.6 - 6.5 %  TSH  Result Value Ref Range   TSH 0.08 (L) 0.35 - 4.50 uIU/mL  LDL cholesterol, direct  Result Value Ref Range   Direct LDL 101.0 mg/dL      Assessment & Plan:   Problem List Items Addressed This Visit    HLD (hyperlipidemia)    rec restart lipitor, as well as fenofibrate 67mg  to price out.       Relevant Medications   atorvastatin (LIPITOR) 40 MG tablet   fenofibrate micronized (LOFIBRA)  67 MG capsule   Hypothyroidism    She has been able to fill levothyroxine 155mcg, but only received #30 tablets.       Skin rash - Primary    Resolving. ?urticarial drug rash to possibly voltaren tablets. rec stop NSAID. Ok to restart lipitor. Discussed anticipated continued resolution.           Follow up plan: Return if symptoms worsen or fail to improve.  Laura Bush, MD

## 2017-07-02 NOTE — Assessment & Plan Note (Signed)
Resolving. ?urticarial drug rash to possibly voltaren tablets. rec stop NSAID. Ok to restart lipitor. Discussed anticipated continued resolution.

## 2017-07-02 NOTE — Assessment & Plan Note (Signed)
She has been able to fill levothyroxine 159mcg, but only received #30 tablets.

## 2017-07-02 NOTE — Assessment & Plan Note (Signed)
rec restart lipitor, as well as fenofibrate 67mg  to price out.

## 2017-07-02 NOTE — Patient Instructions (Addendum)
Compre crema hidratante sin fragancia como eucerin o aveeno.  Puede volver a Hydrographic surveyor.  Puede seguir usando triamcinolone crema de corticosteroide como necesite.

## 2017-09-13 ENCOUNTER — Ambulatory Visit: Payer: No Typology Code available for payment source | Admitting: Family Medicine

## 2017-09-13 DIAGNOSIS — Z0289 Encounter for other administrative examinations: Secondary | ICD-10-CM

## 2018-03-03 ENCOUNTER — Other Ambulatory Visit: Payer: Self-pay | Admitting: Family Medicine

## 2018-06-27 ENCOUNTER — Telehealth: Payer: Self-pay | Admitting: Family Medicine

## 2018-06-27 MED ORDER — LISINOPRIL-HYDROCHLOROTHIAZIDE 20-25 MG PO TABS: 1.0000 | ORAL_TABLET | Freq: Every day | ORAL | 1 refills | Status: DC

## 2018-06-27 MED ORDER — METFORMIN HCL ER 500 MG PO TB24: 500.0000 mg | ORAL_TABLET | Freq: Every day | ORAL | 1 refills | Status: DC

## 2018-06-27 MED ORDER — LEVOTHYROXINE SODIUM 200 MCG PO TABS: ORAL_TABLET | ORAL | 1 refills | Status: DC

## 2018-06-27 NOTE — Telephone Encounter (Signed)
See attached Rx refill request for Dr. Danise Mina

## 2018-06-27 NOTE — Telephone Encounter (Signed)
I have refilled this for patient.  Would recommend she establish with open access clinic in interim until she gets insurance back. Do think she needs routine f/u.  [facility] [phone]) [phone]

## 2018-06-27 NOTE — Telephone Encounter (Signed)
Copied from [place] [id number]. Topic: Quick Communication - See Telephone Encounter >> Jun 27, 2018  1:14 PM Antonieta Iba C wrote: CRM for notification. See Telephone encounter for: 06/27/18.  Patient called with Interpreter. Patient has not had OV since 06/2017 and states she has no money to come to the doctor & is requesting Dr Darnell Level refill these medications. Please contact patient.   levothyroxine (SYNTHROID, LEVOTHROID) 200 MCG tablet lisinopril-hydrochlorothiazide (PRINZIDE,ZESTORETIC) 20-25 MG tablet metFORMIN (GLUCOPHAGE-XR) 500 MG 24 hr tablet  [place], [place] - [address]

## 2018-06-27 NOTE — Telephone Encounter (Addendum)
Is it ok to send refill for pt?  Last OV, thyroid f/u 07/02/17 and last CPE was 01/11/17.

## 2018-06-30 NOTE — Telephone Encounter (Signed)
Spoke with pt relaying Dr. G's message.  Pt verbalizes understanding and expresses her thanks.  

## 2018-07-20 ENCOUNTER — Encounter (HOSPITAL_COMMUNITY): Payer: Self-pay

## 2018-07-20 ENCOUNTER — Other Ambulatory Visit: Payer: Self-pay

## 2018-07-20 ENCOUNTER — Ambulatory Visit (HOSPITAL_COMMUNITY)
Admission: EM | Admit: 2018-07-20 | Discharge: 2018-07-20 | Disposition: A | Discharge status: Home / Self Care | Payer: Self-pay | Attending: Family Medicine | Admitting: Family Medicine

## 2018-07-20 DIAGNOSIS — M5441 Lumbago with sciatica, right side: Secondary | ICD-10-CM

## 2018-07-20 MED ORDER — CYCLOBENZAPRINE HCL 10 MG PO TABS: 10.0000 mg | ORAL_TABLET | Freq: Two times a day (BID) | ORAL | 0 refills | Status: DC | PRN

## 2018-07-20 MED ORDER — PREDNISONE 50 MG PO TABS: 50.0000 mg | ORAL_TABLET | Freq: Every day | ORAL | 0 refills | Status: AC

## 2018-07-20 MED ORDER — KETOROLAC TROMETHAMINE 60 MG/2ML IM SOLN
INTRAMUSCULAR | Status: AC
  Filled 2018-07-20: qty 2

## 2018-07-20 MED ORDER — KETOROLAC TROMETHAMINE 60 MG/2ML IM SOLN
60.0000 mg | Freq: Once | INTRAMUSCULAR | Status: AC
  Administered 2018-07-20: 60 mg via INTRAMUSCULAR

## 2018-07-20 NOTE — ED Provider Notes (Signed)
[facility]    CSN: [id number] Arrival date & time: 07/20/18  1319     History   Chief Complaint No chief complaint on file.   HPI Spanish interpretation via [employer] interpreter [name] is a 47 y.o. female history of DM type II, controlled, hypothyroid, asthma, hypertension, GERD, presenting today for evaluation of back pain.  Patient states that over the past 2 weeks she has pain in her left upper quadrant/left side that radiates to her lower back on both sides.  Patient has noticed with moving her right leg she has worsening pain in her lower back.  Has this as well on the left side, but worse with right leg movements.  Pain is strong especially with sitting.  Having to stand slowly in order to minimize pain.  Also having pain with lying flat and difficulty sleeping due to discomfort.  Has some mild numbness and tingling that radiates down right leg.  Denies numbness between thighs.  Denies changes in urination or bowel movements.  Denies saddle anesthesia.  Patient has had some mild nausea, but this is been related to intense pain.  Denies vomiting.  Has had decreased appetite.  Pain does not worsen with eating.  Denies chest pain or shortness of breath.  She has been taking ibuprofen 600 mg without relief.  Denies weakness in legs.  Denies any specific injury, increase in activity or heavy lifting recently.  HPI  Past Medical History:  Diagnosis Date  . ABORTION, SPONTANEOUS 08/18/2007  . Acquired hypothyroidism   . Anemia   . Asthma   . Complication of anesthesia   . 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 Ectopic pregnancy 04/2006   treated with medication  . GERD (gastroesophageal reflux disease)   . Hepatic steatosis 07/2006, 04/2014   on Abd CT done for pain  . Hypertension   . Hyperthyroidism 10/2000   RAI treatment,  multinodular goiter  . IMPAIRED GLUCOSE TOLERANCE 08/18/2007  . Migraine    "had them often;  many years" (03/13/2016)  . Ovarian cyst 04/2014  . PONV (postoperative nausea and vomiting)   . Preterm delivery 12/2010   x3, placental abruption, maternal htn, DM  . Type II diabetes mellitus ([place]) dx'd 12/2015    Patient Active Problem List   Diagnosis Date Noted  . Mass of occipital region 01/11/2017  . Health maintenance examination 01/11/2017  . Controlled type 2 diabetes mellitus without complication, without long-term current use of insulin ([place]) 10/12/2016  . Endometrioma   . Numbness and tingling in right hand 06/17/2012  . Female pelvic pain 05/07/2012  . Anemia 04/03/2011  . MIGRAINE HEADACHE 05/12/2007  . Cerebral artery occlusion with cerebral infarction ([place]) 12/12/2006  . Skin rash 12/11/2006  . Hypothyroidism 10/31/2006  . HLD (hyperlipidemia) 10/31/2006  . Overweight (BMI 25.0-29.9) 10/31/2006  . HYPERTENSION, BENIGN SYSTEMIC 10/31/2006  . Asthma 10/31/2006  . UMBILICAL HERNIA 24/40/1027    Past Surgical History:  Procedure Laterality Date  . ABDOMINAL HYSTERECTOMY  04/24/2012   Procedure: HYSTERECTOMY ABDOMINAL;  Surgeon: Terrance Mass, MD;  Location: [facility];  Service: Gynecology;;  . [place]  . CESAREAN SECTION  2012  . CHOLECYSTECTOMY  1997  . COLONOSCOPY  10/2014   1 hyperplastic polyp, rpt 10 yrs Henrene Pastor)  . ESOPHAGOGASTRODUODENOSCOPY  10/2014   WNL  . EXCISION OF ENDOMETRIOMA N/A 08/20/2016   Procedure: EXCISION OF ENDOMETRIOMA LEFT LOWER QUADRANT;  Surgeon: Georganna Skeans, MD;  Location: [facility];  Service: General;  Laterality: N/A;  . LAPAROSCOPIC HYSTERECTOMY  04/24/2012   Procedure: HYSTERECTOMY TOTAL LAPAROSCOPIC;  Surgeon: Terrance Mass, MD;  Location: [facility];  Service: Gynecology;  Laterality: N/A;  2 1/2 hours OR time.  Dr. Phineas Real to assisting   . LAPAROSCOPIC LYSIS INTESTINAL ADHESIONS  2013  . OVARIAN CYST REMOVAL  2012  . THYROID SURGERY  ~ 2000   goiter removed    OB History    Gravida  5   Para  1     Term      Preterm  1   AB  3   Living  1     SAB  3   TAB      Ectopic      Multiple      Live Births               Home Medications    Prior to Admission medications   Medication Sig Start Date End Date Taking? Authorizing Provider  aspirin 81 MG tablet Take 1 tablet (81 mg total) by mouth daily. 01/11/17   Ria Bush, MD  atorvastatin (LIPITOR) 40 MG tablet Take 1 tablet (40 mg total) by mouth daily. 07/02/17   Ria Bush, MD  cyclobenzaprine (FLEXERIL) 10 MG tablet Take 1 tablet (10 mg total) by mouth 2 (two) times daily as needed for muscle spasms. 07/20/18   Teralyn Mullins C, PA-C  fenofibrate micronized (LOFIBRA) 67 MG capsule Take 1 capsule (67 mg total) by mouth daily before breakfast. 07/02/17 06/27/18  Ria Bush, MD  hydrOXYzine (ATARAX/VISTARIL) 25 MG tablet Take 1-2 tablets (25-50 mg total) by mouth every 8 (eight) hours as needed. 06/10/17   Jaynee Eagles, PA-C  levothyroxine (SYNTHROID, LEVOTHROID) 200 MCG tablet TAKE 1 TABLET BY MOUTH ONCE DAILY BEFORE  BREAKFAST  (NEW  DOSE) 06/27/18   Ria Bush, MD  lisinopril-hydrochlorothiazide (PRINZIDE,ZESTORETIC) 20-25 MG tablet Take 1 tablet by mouth daily. 06/27/18   Ria Bush, MD  metFORMIN (GLUCOPHAGE-XR) 500 MG 24 hr tablet Take 1 tablet (500 mg total) by mouth daily with breakfast. 06/27/18   Ria Bush, MD  montelukast (SINGULAIR) 10 MG tablet Take 10 mg by mouth at bedtime.    [provider]  predniSONE (DELTASONE) 50 MG tablet Take 1 tablet (50 mg total) by mouth daily for 5 days. 07/20/18 07/25/18  Ajit Errico C, PA-C  sucralfate (CARAFATE) 1 GM/10ML suspension Take 10 mLs (1 g total) by mouth 3 (three) times daily before meals. Patient not taking: Reported on 10/15/2014 06/07/14 10/27/14  Ria Bush, MD    Family History Family History  Problem Relation Age of Onset  . Hypertension Father   . Stroke Father        hemorrhagic, deceased  .  Diabetes Mother   . Uterine cancer Mother   . Coronary artery disease Neg Hx   . Cancer Neg Hx     Social History Social History   Tobacco Use  . Smoking status: Never Smoker  . Smokeless tobacco: Never Used  Substance Use Topics  . Alcohol use: No  . Drug use: No     Allergies   Voltaren [diclofenac sodium]   Review of Systems Review of Systems  Constitutional: Negative for fatigue and fever.  Eyes: Negative for visual disturbance.  Respiratory: Negative for shortness of breath.   Cardiovascular: Negative for chest pain.  Gastrointestinal: Positive for abdominal pain and nausea. Negative for vomiting.  Genitourinary: Positive for  flank pain. Negative for decreased urine volume, difficulty urinating, dysuria and frequency.  Musculoskeletal: Positive for back pain and myalgias. Negative for arthralgias and joint swelling.  Skin: Negative for color change, rash and wound.  Neurological: Negative for dizziness, weakness, light-headedness and headaches.     Physical Exam Triage Vital Signs ED Triage Vitals  Enc Vitals Group     BP 07/20/18 1343 (!) 141/86     Pulse --      Resp 07/20/18 1343 16     Temp 07/20/18 1343 98.6 F (37 C)     Temp Source 07/20/18 1343 Oral     SpO2 07/20/18 1343 100 %     Weight 07/20/18 1342 160 lb (72.6 kg)     Height --      Head Circumference --      Peak Flow --      Pain Score 07/20/18 1342 10     Pain Loc --      Pain Edu? --      Excl. in [place]? --    No data found.  Updated Vital Signs BP (!) 141/86 (BP Location: Right Arm)   Temp 98.6 F (37 C) (Oral)   Resp 16   Wt 160 lb (72.6 kg)   LMP 11/12/2011   SpO2 100%   BMI 29.03 kg/m   Visual Acuity Right Eye Distance:   Left Eye Distance:   Bilateral Distance:    Right Eye Near:   Left Eye Near:    Bilateral Near:     Physical Exam  Constitutional: She appears well-developed and well-nourished. No distress.  HENT:  Head: Normocephalic and atraumatic.  Eyes:  Conjunctivae are normal.  Neck: Neck supple.  Cardiovascular: Normal rate and regular rhythm.  No murmur heard. Pulmonary/Chest: Effort normal and breath sounds normal. No respiratory distress.  Abdominal: Soft. There is no tenderness.  Abdomen tender throughout left upper and lower quadrants, more laterally into flank.  Negative rebound, negative Rovsing.  Musculoskeletal: She exhibits no edema.  Nontender throughout thoracic spine midline, mild tenderness throughout lumbar spine midline, increased tenderness throughout bilateral lumbar musculature, does not extend into sacral area  Positive straight leg on right  Strength 5/5 and equal bilaterally, pain elicited with hip flexion and knee extension.  Patellar reflexes 2+ bilaterally  Neurological: She is alert.  Skin: Skin is warm and dry.  Psychiatric: She has a normal mood and affect.  Nursing note and vitals reviewed.    UC Treatments / Results  Labs (all labs ordered are listed, but only abnormal results are displayed) Labs Reviewed - No data to display  EKG None  Radiology No results found.  Procedures Procedures (including critical care time)  Medications Ordered in UC Medications  ketorolac (TORADOL) injection 60 mg (has no administration in time range)    Initial Impression / Assessment and Plan / UC Course  I have reviewed the triage vital signs and the nursing notes.  Pertinent labs & imaging results that were available during my care of the patient were reviewed by me and considered in my medical decision making (see chart for details).    Patient appears to have low back pain with radicular distribution.  Atypical with abdominal discomfort.  No red flags for cauda equina.  Does not seem GI related.  At this time will treat patient with Toradol in clinic today, will send home with prednisone.  Discussed monitoring blood sugar with this.  Also Flexeril.  Advised against total bedrest.Discussed strict return  precautions. Patient verbalized understanding and is agreeable with plan.  Final Clinical Impressions(s) / UC Diagnoses   Final diagnoses:  Acute bilateral low back pain with right-sided sciatica     Discharge Instructions     Da Ardelia Mems inyeccion de Toradol hoy Use prednisone cada dia por 5 dias in la manana con comida- este puede causar sus azucar de sangre para ser mas alta [place] flexeril dos veces cada dia. Este causa suenos, pues no [place] cuando Armed forces operational officer o trabajar- puede toma 1/2 de pastilla si este es tan fuerte  Regrese si sus sintomas no mejoran con Dispensing optician, son Higher education careers adviser, o tener debilidad en sus peirnas     ED Prescriptions    Medication Sig Dispense Auth. Provider   predniSONE (DELTASONE) 50 MG tablet Take 1 tablet (50 mg total) by mouth daily for 5 days. 5 tablet Avyana Puffenbarger C, PA-C   cyclobenzaprine (FLEXERIL) 10 MG tablet Take 1 tablet (10 mg total) by mouth 2 (two) times daily as needed for muscle spasms. 20 tablet Dakiya Puopolo, [place] C, PA-C     Controlled Substance Prescriptions [place] Controlled Substance Registry consulted? Not Applicable   Janith Lima, [place] 07/20/18 1430

## 2018-07-20 NOTE — Discharge Instructions (Addendum)
Da Ardelia Mems inyeccion de Toradol hoy Use prednisone cada dia por 5 dias in la manana con comida- este puede causar sus azucar de sangre para ser mas alta [place] flexeril dos veces cada dia. Este causa suenos, pues no [place] cuando Armed forces operational officer o trabajar- puede toma 1/2 de pastilla si este es tan fuerte  Regrese si sus sintomas no mejoran con el tratamiento, son mas peor, o tener debilidad en sus peirnas

## 2018-07-20 NOTE — ED Triage Notes (Signed)
Pt c/o back and abdominal pain x 1 week. Pt states her legs hurt as well.

## 2018-10-04 HISTORY — PX: FLEXIBLE SIGMOIDOSCOPY: SHX1649

## 2018-10-19 ENCOUNTER — Encounter (HOSPITAL_COMMUNITY): Payer: Self-pay | Admitting: Emergency Medicine

## 2018-10-19 ENCOUNTER — Emergency Department (HOSPITAL_COMMUNITY)
Admission: EM | Admit: 2018-10-19 | Discharge: 2018-10-19 | Disposition: A | Discharge status: Home / Self Care | Payer: Self-pay | Attending: Emergency Medicine | Admitting: Emergency Medicine

## 2018-10-19 ENCOUNTER — Other Ambulatory Visit: Payer: Self-pay

## 2018-10-19 ENCOUNTER — Emergency Department (HOSPITAL_COMMUNITY): Payer: Self-pay

## 2018-10-19 DIAGNOSIS — J45909 Unspecified asthma, uncomplicated: Secondary | ICD-10-CM | POA: Insufficient documentation

## 2018-10-19 DIAGNOSIS — Z7982 Long term (current) use of aspirin: Secondary | ICD-10-CM | POA: Insufficient documentation

## 2018-10-19 DIAGNOSIS — R519 Headache, unspecified: Secondary | ICD-10-CM

## 2018-10-19 DIAGNOSIS — E119 Type 2 diabetes mellitus without complications: Secondary | ICD-10-CM | POA: Insufficient documentation

## 2018-10-19 DIAGNOSIS — R51 Headache: Secondary | ICD-10-CM | POA: Insufficient documentation

## 2018-10-19 DIAGNOSIS — Z8673 Personal history of transient ischemic attack (TIA), and cerebral infarction without residual deficits: Secondary | ICD-10-CM | POA: Insufficient documentation

## 2018-10-19 DIAGNOSIS — Z79899 Other long term (current) drug therapy: Secondary | ICD-10-CM | POA: Insufficient documentation

## 2018-10-19 DIAGNOSIS — E039 Hypothyroidism, unspecified: Secondary | ICD-10-CM | POA: Insufficient documentation

## 2018-10-19 LAB — BASIC METABOLIC PANEL
Anion gap: 10 (ref 5–15)
BUN: 14 mg/dL (ref 6–20)
CHLORIDE: 104 mmol/L (ref 98–111)
CO2: 23 mmol/L (ref 22–32)
Calcium: 9.4 mg/dL (ref 8.9–10.3)
Creatinine, Ser: 0.67 mg/dL (ref 0.44–1.00)
GFR calc non Af Amer: >60 mL/min (ref >60)
Glucose, Bld: 98 mg/dL (ref 70–99)
POTASSIUM: 3.9 mmol/L (ref 3.5–5.1)
SODIUM: 137 mmol/L (ref 135–145)

## 2018-10-19 LAB — CBC
HCT: 41.7 % (ref 36.0–46.0)
HEMOGLOBIN: 13.3 g/dL (ref 12.0–15.0)
MCH: 28.4 pg (ref 26.0–34.0)
MCHC: 31.9 g/dL (ref 30.0–36.0)
MCV: 88.9 fL (ref 80.0–100.0)
NRBC: 0 % (ref 0.0–0.2)
Platelets: 154 10*3/uL (ref 150–400)
RBC: 4.69 MIL/uL (ref 3.87–5.11)
RDW: 12.3 % (ref 11.5–15.5)
WBC: 7.6 10*3/uL (ref 4.0–10.5)

## 2018-10-19 LAB — I-STAT TROPONIN, ED: Troponin i, poc: 0 ng/mL (ref 0.00–0.08)

## 2018-10-19 LAB — I-STAT BETA HCG BLOOD, ED (MC, WL, AP ONLY)

## 2018-10-19 MED ORDER — PROCHLORPERAZINE EDISYLATE 10 MG/2ML IJ SOLN
10.0000 mg | INTRAMUSCULAR | Status: AC
  Administered 2018-10-19: 10 mg via INTRAVENOUS
  Filled 2018-10-19: qty 2

## 2018-10-19 MED ORDER — SODIUM CHLORIDE 0.9% FLUSH: 3.0000 mL | Freq: Once | INTRAVENOUS | Status: DC

## 2018-10-19 NOTE — ED Triage Notes (Addendum)
C/o frontal headache, nausea, and dizziness since yesterday.  Reports history of headaches.  No neuro deficits noted on triage exam. Also reports mild pain to center of chest.

## 2018-10-19 NOTE — ED Provider Notes (Signed)
[facility] EMERGENCY DEPARTMENT Provider Note   CSN: [id number] Arrival date & time: 10/19/18  1900     History   Chief Complaint Chief Complaint  Patient presents with  . Headache    HPI [name] is a 48 y.o. female. Level 5 caveat secondary to language barrier and history is obtained through her prior HPI  48 year old female presents tonight complaining of headache that began earlier today.  She describes nausea and vertigo with this.  She has a history of headaches.  She describes the headache is in the frontal region.  Initially told the front she had some pain in the middle of her chest but denies this to me.  She has not noted fever, chills, or diarrhea.  She has had 3 episodes of vomiting.  Last episode of difficulty walking.  Past Medical History:  Diagnosis Date  . ABORTION, SPONTANEOUS 08/18/2007  . Acquired hypothyroidism   . Anemia   . Asthma   . Complication of anesthesia   . 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 Ectopic pregnancy 04/2006   treated with medication  . GERD (gastroesophageal reflux disease)   . Hepatic steatosis 07/2006, 04/2014   on Abd CT done for pain  . Hypertension   . Hyperthyroidism 10/2000   RAI treatment,  multinodular goiter  . IMPAIRED GLUCOSE TOLERANCE 08/18/2007  . Migraine    "had them often; many years" (03/13/2016)  . Ovarian cyst 04/2014  . PONV (postoperative nausea and vomiting)   . Preterm delivery 12/2010   x3, placental abruption, maternal htn, DM  . Type II diabetes mellitus ([place]) dx'd 12/2015    Patient Active Problem List   Diagnosis Date Noted  . Mass of occipital region 01/11/2017  . Health maintenance examination 01/11/2017  . Controlled type 2 diabetes mellitus without complication, without long-term current use of insulin ([place]) 10/12/2016  . Endometrioma   . Numbness and tingling in right hand 06/17/2012  . Female pelvic pain 05/07/2012  .  Anemia 04/03/2011  . MIGRAINE HEADACHE 05/12/2007  . Cerebral artery occlusion with cerebral infarction ([place]) 12/12/2006  . Skin rash 12/11/2006  . Hypothyroidism 10/31/2006  . HLD (hyperlipidemia) 10/31/2006  . Overweight (BMI 25.0-29.9) 10/31/2006  . HYPERTENSION, BENIGN SYSTEMIC 10/31/2006  . Asthma 10/31/2006  . UMBILICAL HERNIA 43/15/4008    Past Surgical History:  Procedure Laterality Date  . ABDOMINAL HYSTERECTOMY  04/24/2012   Procedure: HYSTERECTOMY ABDOMINAL;  Surgeon: Terrance Mass, MD;  Location: [facility];  Service: Gynecology;;  . [place]  . CESAREAN SECTION  2012  . CHOLECYSTECTOMY  1997  . COLONOSCOPY  10/2014   1 hyperplastic polyp, rpt 10 yrs [facility] Pastor)  . ESOPHAGOGASTRODUODENOSCOPY  10/2014   WNL  . EXCISION OF ENDOMETRIOMA N/A 08/20/2016   Procedure: EXCISION OF ENDOMETRIOMA LEFT LOWER QUADRANT;  Surgeon: Georganna Skeans, MD;  Location: [facility];  Service: General;  Laterality: N/A;  . LAPAROSCOPIC HYSTERECTOMY  04/24/2012   Procedure: HYSTERECTOMY TOTAL LAPAROSCOPIC;  Surgeon: Terrance Mass, MD;  Location: [facility];  Service: Gynecology;  Laterality: N/A;  2 1/2 hours OR time.  Dr. Phineas Real to assisting   . LAPAROSCOPIC LYSIS INTESTINAL ADHESIONS  2013  . OVARIAN CYST REMOVAL  2012  . THYROID SURGERY  ~ 2000   goiter removed     OB History    Gravida  5   Para  1   Term  Preterm  1   AB  3   Living  1     SAB  3   TAB      Ectopic      Multiple      Live Births               Home Medications    Prior to Admission medications   Medication Sig Start Date End Date Taking? Authorizing Provider  CALCIUM PO Take 1 tablet by mouth daily.   Yes [provider]  levothyroxine (SYNTHROID, LEVOTHROID) 200 MCG tablet TAKE 1 TABLET BY MOUTH ONCE DAILY BEFORE  BREAKFAST  (NEW  DOSE) Patient taking differently: Take 200 mcg by mouth daily.  06/27/18  Yes Ria Bush, MD  lisinopril-hydrochlorothiazide  (PRINZIDE,ZESTORETIC) 20-25 MG tablet Take 1 tablet by mouth daily. 06/27/18  Yes Ria Bush, MD  MAGNESIUM PO Take 1 tablet by mouth daily.   Yes [provider]  metFORMIN (GLUCOPHAGE-XR) 500 MG 24 hr tablet Take 1 tablet (500 mg total) by mouth daily with breakfast. Patient taking differently: Take 500 mg by mouth daily with lunch.  06/27/18  Yes Ria Bush, MD  aspirin 81 MG tablet Take 1 tablet (81 mg total) by mouth daily. Patient not taking: Reported on 10/19/2018 01/11/17   Ria Bush, MD  atorvastatin (LIPITOR) 40 MG tablet Take 1 tablet (40 mg total) by mouth daily. Patient not taking: Reported on 10/19/2018 07/02/17   Ria Bush, MD  cyclobenzaprine (FLEXERIL) 10 MG tablet Take 1 tablet (10 mg total) by mouth 2 (two) times daily as needed for muscle spasms. Patient not taking: Reported on 10/19/2018 07/20/18   Wieters, [employer] C, PA-C  fenofibrate micronized (LOFIBRA) 67 MG capsule Take 1 capsule (67 mg total) by mouth daily before breakfast. Patient not taking: Reported on 10/19/2018 07/02/17 06/27/18  Ria Bush, MD  hydrOXYzine (ATARAX/VISTARIL) 25 MG tablet Take 1-2 tablets (25-50 mg total) by mouth every 8 (eight) hours as needed. Patient not taking: Reported on 10/19/2018 06/10/17   Jaynee Eagles, PA-C    Family History Family History  Problem Relation Age of Onset  . Hypertension Father   . Stroke Father        hemorrhagic, deceased  . Diabetes Mother   . Uterine cancer Mother   . Coronary artery disease Neg Hx   . Cancer Neg Hx     Social History Social History   Tobacco Use  . Smoking status: Never Smoker  . Smokeless tobacco: Never Used  Substance Use Topics  . Alcohol use: No  . Drug use: No     Allergies   Voltaren [diclofenac sodium]   Review of Systems Review of Systems  All other systems reviewed and are negative.    Physical Exam Updated Vital Signs BP (!) 151/91   Pulse 74   Temp 97.9 F (36.6 C)  (Oral)   Resp 14   LMP 11/12/2011   SpO2 99%   Physical Exam Vitals signs and nursing note reviewed.  Constitutional:      General: She is not in acute distress.    Appearance: She is well-developed and normal weight.  HENT:     Head: Normocephalic and atraumatic.     Mouth/Throat:     Mouth: Mucous membranes are moist.  Eyes:     Extraocular Movements: Extraocular movements intact.  Neck:     Musculoskeletal: Normal range of motion.  Cardiovascular:     Rate and Rhythm: Normal rate and regular rhythm.  Heart sounds: Normal heart sounds.  Pulmonary:     Effort: Pulmonary effort is normal.     Breath sounds: Normal breath sounds.  Abdominal:     General: Bowel sounds are normal.     Palpations: Abdomen is soft.  Musculoskeletal: Normal range of motion.  Skin:    General: Skin is warm and dry.  Neurological:     Mental Status: She is alert.     Cranial Nerves: No cranial nerve deficit.     Sensory: No sensory deficit.     Motor: No weakness.     Coordination: Romberg sign negative. Coordination normal.  Psychiatric:        Mood and Affect: Mood normal.        Behavior: Behavior normal.      ED Treatments / Results  Labs (all labs ordered are listed, but only abnormal results are displayed) Labs Reviewed  BASIC METABOLIC PANEL  CBC  I-STAT TROPONIN, ED  I-STAT BETA HCG BLOOD, ED ([facility], [facility], [facility] ONLY)    EKG EKG Interpretation  Date/Time:  Sunday October 19 2018 19:11:40 EST Ventricular Rate:  80 PR Interval:  138 QRS Duration: 86 QT Interval:  374 QTC Calculation: 431 R Axis:   78 Text Interpretation:  Normal sinus rhythm Normal ECG No old tracing to compare Confirmed by Pattricia Boss [phone]) on 10/19/2018 7:16:45 PM   Radiology Dg Chest 2 View  Result Date: 10/19/2018 CLINICAL DATA:  48 year old female with headache nausea and dizziness since yesterday. Mild central chest pain. EXAM: CHEST - 2 VIEW COMPARISON:  11/16/2016 and earlier. FINDINGS: Lung  volumes and mediastinal contours are within normal limits. Visualized tracheal air column is within normal limits. Both lungs appear clear. No pneumothorax or pleural effusion. Stable cholecystectomy clips. Negative visible bowel gas pattern. No acute osseous abnormality identified. IMPRESSION: Negative.  No acute cardiopulmonary abnormality. Electronically Signed   By: Genevie Ann M.D.   On: 10/19/2018 19:30   Ct Head Wo Contrast  Result Date: 10/19/2018 CLINICAL DATA:  Frontal headache, nausea and dizziness. History of headaches. EXAM: CT HEAD WITHOUT CONTRAST TECHNIQUE: Contiguous axial images were obtained from the base of the skull through the vertex without intravenous contrast. COMPARISON:  Head CT dated 04/11/2014. FINDINGS: Brain: Ventricles are stable in size and configuration. There is no mass, hemorrhage, edema or other evidence of acute parenchymal abnormality. No extra-axial hemorrhage. Incidental note made of prominent perivascular spaces within the bilateral basal ganglia regions. Vascular: No hyperdense vessel or unexpected calcification. Skull: Normal. Negative for fracture or focal lesion. Sinuses/Orbits: No acute finding. Other: None. IMPRESSION: Negative head CT. No intracranial mass, hemorrhage or edema. Electronically Signed   By: Franki Cabot M.D.   On: 10/19/2018 21:40    Procedures Procedures (including critical care time)  Medications Ordered in ED Medications  sodium chloride flush (NS) 0.9 % injection 3 mL (has no administration in time range)  prochlorperazine (COMPAZINE) injection 10 mg (10 mg Intravenous Given 10/19/18 2204)     Initial Impression / Assessment and Plan / ED Course  I have reviewed the triage vital signs and the nursing notes.  Pertinent labs & imaging results that were available during my care of the patient were reviewed by me and considered in my medical decision making (see chart for details).     She received IV Compazine and feels greatly  improved here.  She is stable for discharge.  We discussed return precautions she voiced understanding.  Final Clinical Impressions(s) / ED Diagnoses  Final diagnoses:  Acute nonintractable headache, unspecified headache type    ED Discharge Orders    None       Pattricia Boss, MD 10/19/18 2220

## 2018-10-19 NOTE — ED Notes (Signed)
In X [facility]

## 2019-01-08 ENCOUNTER — Telehealth: Payer: Self-pay | Admitting: Family Medicine

## 2019-01-09 NOTE — Telephone Encounter (Signed)
E-scribed refills. Pls schedule virtual f/u visit with labs.

## 2019-01-12 NOTE — Telephone Encounter (Signed)
Pt is scheduled for 01/13/19 for lab work and 01/14/19 appt with Dr. Darnell Level. Please put the lab orders in.

## 2019-01-12 NOTE — Telephone Encounter (Signed)
Noted. Fwd to Dr. Darnell Level to enter lab orders.

## 2019-01-13 ENCOUNTER — Other Ambulatory Visit: Payer: Self-pay | Admitting: Family Medicine

## 2019-01-13 ENCOUNTER — Other Ambulatory Visit: Payer: Self-pay

## 2019-01-13 DIAGNOSIS — E785 Hyperlipidemia, unspecified: Secondary | ICD-10-CM

## 2019-01-13 DIAGNOSIS — I10 Essential (primary) hypertension: Secondary | ICD-10-CM

## 2019-01-13 DIAGNOSIS — E119 Type 2 diabetes mellitus without complications: Secondary | ICD-10-CM

## 2019-01-13 DIAGNOSIS — D649 Anemia, unspecified: Secondary | ICD-10-CM

## 2019-01-13 DIAGNOSIS — E039 Hypothyroidism, unspecified: Secondary | ICD-10-CM

## 2019-01-13 NOTE — Telephone Encounter (Signed)
Labs ordered.

## 2019-01-14 ENCOUNTER — Encounter: Payer: Self-pay | Admitting: Family Medicine

## 2019-01-14 ENCOUNTER — Ambulatory Visit (INDEPENDENT_AMBULATORY_CARE_PROVIDER_SITE_OTHER): Payer: Self-pay | Admitting: Family Medicine

## 2019-01-14 ENCOUNTER — Telehealth: Payer: Self-pay

## 2019-01-14 VITALS — Ht 62.25 in

## 2019-01-14 DIAGNOSIS — R1032 Left lower quadrant pain: Secondary | ICD-10-CM

## 2019-01-14 DIAGNOSIS — I10 Essential (primary) hypertension: Secondary | ICD-10-CM

## 2019-01-14 DIAGNOSIS — E119 Type 2 diabetes mellitus without complications: Secondary | ICD-10-CM

## 2019-01-14 DIAGNOSIS — I6350 Cerebral infarction due to unspecified occlusion or stenosis of unspecified cerebral artery: Secondary | ICD-10-CM

## 2019-01-14 DIAGNOSIS — E039 Hypothyroidism, unspecified: Secondary | ICD-10-CM

## 2019-01-14 DIAGNOSIS — E785 Hyperlipidemia, unspecified: Secondary | ICD-10-CM

## 2019-01-14 MED ORDER — ATORVASTATIN CALCIUM 40 MG PO TABS: 40.0000 mg | ORAL_TABLET | Freq: Every day | ORAL | 1 refills | Status: DC

## 2019-01-14 NOTE — Telephone Encounter (Signed)
Left message on vm per dpr asking pt to call back.

## 2019-01-14 NOTE — Telephone Encounter (Signed)
Left message on vm per dpr asking pt to call back and r/s lab visit.   [Has virtual f/u today at 12:00.]

## 2019-01-14 NOTE — Addendum Note (Signed)
Addended by: Ria Bush on: 01/14/2019 12:44 PM   Modules accepted: Level of Service

## 2019-01-14 NOTE — Telephone Encounter (Signed)
Spoke with pt's husband, Costella Hatcher (on dpr), at [phone].  Was able to review pt's med list and r/s lab visit for 01/15/19 at 8:55.

## 2019-01-14 NOTE — Assessment & Plan Note (Signed)
H/o this remotely, continue aspirin, restart statin.

## 2019-01-14 NOTE — Assessment & Plan Note (Signed)
Endorses good control at home on lisinopril hctz - continue.

## 2019-01-14 NOTE — Assessment & Plan Note (Signed)
Story/symptoms suspicious for diverticulitis, CT scan at [facility] ER yesterday with mild mucosal thickening of sigmoid colon without obvious diverticulitis or inflammation. Possibly partially treated diverticulitis? (started on what sounds like cipro/flagyl course 2d ago). Recommend finish abx and notify us if ongoing symptoms despite finishing. Discussed need for colonoscopy after symptoms resolved - will further review at f/u appt in the summer.

## 2019-01-14 NOTE — Assessment & Plan Note (Signed)
Has not been taking cholesterol medicine. I have refilled lipitor

## 2019-01-14 NOTE — Assessment & Plan Note (Addendum)
Endorses good control on current regimen - update labs when she returns.

## 2019-01-14 NOTE — Progress Notes (Signed)
Virtual visit completed through [employer]. Due to national recommendations of social distancing due to [place] 19, a virtual visit is felt to be most appropriate for this patient at this time. Interactive audio and video telecommunications were attempted between myself and [name], however failed due to patient having technical difficulties AND patient did not have access to video capability.  We continued and completed visit with audio only.   Patient location: home Provider location: [place] at [facility], office If any vitals were documented, they were collected by patient at home unless specified below.    Ht 5' 2.25" (1.581 m)   LMP 11/12/2011   BMI 29.03 kg/m    CC: med refill visit  Subjective:    Patient ID: [name], female    DOB: 17-Sep-1970, 48 y.o.   MRN: [id number]  HPI: [name] is a 48 y.o. female presenting on 01/14/2019 for Follow-up   Last seen here 06/2017.  She has no insurance - did not like [facility], would like to continue seeing [place] as self pay.   Evaluated yesterday at ER with several weeks of LLQ abd pain, diarrhea, rectal bleeding. ER records reviewed (stable UA, CMP, CBC, lipase). 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 She was started on abx Monday by [facility] for symptoms (on cipro/flagyl). Intermittent diarrheal illness.   Compliant with meds including aspirin, levothyroxine 250mcg daily, lisinopril hctz 20/25mg  daily, and metformin XR 500mg  daily.   She has been taking magnesium - unclear why. Advised stop this.      Relevant past medical, surgical, family and social history reviewed and updated as indicated. Interim medical history since our last visit reviewed. Allergies and medications reviewed and updated. Outpatient Medications Prior to Visit  Medication Sig Dispense Refill  . aspirin 81 MG tablet Take 1 tablet (81 mg total) by mouth daily.    Marland Kitchen CALCIUM PO Take 1 tablet by mouth daily.     . fenofibrate micronized (LOFIBRA) 67 MG capsule Take 1 capsule (67 mg total) by mouth daily before breakfast. 30 capsule 11  . levothyroxine (SYNTHROID) 200 MCG tablet TAKE 1 TABLET BY MOUTH ONCE DAILY BEFORE BREAKFAST (NEW  DOSE) 90 tablet 0  . lisinopril-hydrochlorothiazide (ZESTORETIC) 20-25 MG tablet Take 1 tablet by mouth once daily 90 tablet 0  . metFORMIN (GLUCOPHAGE-XR) 500 MG 24 hr tablet Take 1 tablet by mouth once daily with breakfast 90 tablet 0  . atorvastatin (LIPITOR) 40 MG tablet Take 1 tablet (40 mg total) by mouth daily. 90 tablet 3  . MAGNESIUM PO Take 1 tablet by mouth daily.    . cyclobenzaprine (FLEXERIL) 10 MG tablet Take 1 tablet (10 mg total) by mouth 2 (two) times daily as needed for muscle spasms. (Patient not taking: Reported on 10/19/2018) 20 tablet 0  . hydrOXYzine (ATARAX/VISTARIL) 25 MG tablet Take 1-2 tablets (25-50 mg total) by mouth every 8 (eight) hours as needed. (Patient not taking: Reported on 10/19/2018) 60 tablet 0   No facility-administered medications prior to visit.      Per HPI unless specifically indicated in ROS section below Review of Systems Objective:    Ht 5' 2.25" (1.581 m)   LMP 11/12/2011   BMI 29.03 kg/m   Wt Readings from Last 3 Encounters:  07/20/18 160 lb (72.6 kg)  07/02/17 163 lb 4 oz (74 kg)  06/18/17 164 lb (74.4 kg)     Physical exam: Gen: alert, NAD, not ill appearing Pulm: speaks in complete  sentences without increased work of breathing Psych: normal mood, normal thought content      Results for orders placed or performed during the hospital encounter of 13/24/40  Basic metabolic panel  Result Value Ref Range   Sodium 137 135 - 145 mmol/L   Potassium 3.9 3.5 - 5.1 mmol/L   Chloride 104 98 - 111 mmol/L   CO2 23 22 - 32 mmol/L   Glucose, Bld 98 70 - 99 mg/dL   BUN 14 6 - 20 mg/dL   Creatinine, Ser 0.67 0.44 - 1.00 mg/dL   Calcium 9.4 8.9 - 10.3 mg/dL   GFR calc non Af Amer >60 >60 mL/min   GFR calc Af Amer >60  >60 mL/min   Anion gap 10 5 - 15  CBC  Result Value Ref Range   WBC 7.6 4.0 - 10.5 K/uL   RBC 4.69 3.87 - 5.11 MIL/uL   Hemoglobin 13.3 12.0 - 15.0 g/dL   HCT 41.7 36.0 - 46.0 %   MCV 88.9 80.0 - 100.0 fL   MCH 28.4 26.0 - 34.0 pg   MCHC 31.9 30.0 - 36.0 g/dL   RDW 12.3 11.5 - 15.5 %   Platelets 154 150 - 400 K/uL   nRBC 0.0 0.0 - 0.2 %  I-stat troponin, ED  Result Value Ref Range   Troponin i, poc 0.00 0.00 - 0.08 ng/mL   Comment 3          I-Stat beta hCG blood, ED  Result Value Ref Range   I-stat hCG, quantitative <5.0 <5 mIU/mL   Comment 3           Assessment & Plan:   Problem List Items Addressed This Visit    LLQ abdominal pain - Primary    Story/symptoms suspicious for diverticulitis, CT [facility] ER yesterday with mild mucosal thickening of sigmoid colon without obvious diverticulitis or inflammation. Possibly partially treated diverticulitis? (started on what sounds like cipro/flagyl course 2d ago). Recommend finish abx and notify us if ongoing symptoms despite finishing. Discussed need for colonoscopy after symptoms resolved - will further review at f/u appt in the summer.       Hypothyroidism    Endorses good control on current regimen - update labs when she returns.       Relevant Orders   TSH   HYPERTENSION, BENIGN SYSTEMIC    Endorses good control at home on lisinopril hctz - continue.       Relevant Medications   atorvastatin (LIPITOR) 40 MG tablet   HLD (hyperlipidemia)    Has not been taking cholesterol medicine. I have refilled lipitor       Relevant Medications   atorvastatin (LIPITOR) 40 MG tablet   Other Relevant Orders   Lipid panel   Controlled type 2 diabetes mellitus without complication, without long-term current use of insulin (HCC)    Endorses compliance with metformin 500mg  XR - continue. Update A1c at tomorrow's labs.       Relevant Medications   atorvastatin (LIPITOR) 40 MG tablet   Other Relevant Orders   Hemoglobin  A1c   Cerebral artery occlusion with cerebral infarction [facility])    H/o this remotely, continue aspirin, restart statin.       Relevant Medications   atorvastatin (LIPITOR) 40 MG tablet       Meds ordered this encounter  Medications  . atorvastatin (LIPITOR) 40 MG tablet    Sig: Take 1 tablet (40 mg total) by mouth daily.  Dispense:  90 tablet    Refill:  1   Orders Placed This Encounter  Procedures  . Hemoglobin A1c    Standing Status:   Future    Standing Expiration Date:   01/14/2020  . TSH    Standing Status:   Future    Standing Expiration Date:   01/14/2020  . Lipid panel    Standing Status:   Future    Standing Expiration Date:   01/14/2020    Follow up plan: No follow-ups on file.  Ria Bush, MD

## 2019-01-14 NOTE — Assessment & Plan Note (Signed)
Endorses compliance with metformin 500mg  XR - continue. Update A1c at tomorrow's labs.

## 2019-01-15 ENCOUNTER — Other Ambulatory Visit: Payer: Self-pay

## 2019-02-02 HISTORY — PX: COLONOSCOPY: SHX174

## 2019-03-04 HISTORY — PX: FLEXIBLE SIGMOIDOSCOPY: SHX1649

## 2019-03-16 ENCOUNTER — Ambulatory Visit: Payer: Self-pay | Admitting: Family Medicine

## 2019-03-16 ENCOUNTER — Other Ambulatory Visit: Payer: Self-pay

## 2019-03-16 ENCOUNTER — Encounter: Payer: Self-pay | Admitting: Family Medicine

## 2019-03-16 VITALS — BP 116/80 | HR 81 | Temp 97.9°F | Ht 62.0 in | Wt 163.6 lb

## 2019-03-16 DIAGNOSIS — J4520 Mild intermittent asthma, uncomplicated: Secondary | ICD-10-CM

## 2019-03-16 DIAGNOSIS — Z0001 Encounter for general adult medical examination with abnormal findings: Secondary | ICD-10-CM

## 2019-03-16 DIAGNOSIS — E039 Hypothyroidism, unspecified: Secondary | ICD-10-CM

## 2019-03-16 DIAGNOSIS — I10 Essential (primary) hypertension: Secondary | ICD-10-CM

## 2019-03-16 DIAGNOSIS — Z1239 Encounter for other screening for malignant neoplasm of breast: Secondary | ICD-10-CM

## 2019-03-16 DIAGNOSIS — K601 Chronic anal fissure: Secondary | ICD-10-CM

## 2019-03-16 DIAGNOSIS — E118 Type 2 diabetes mellitus with unspecified complications: Secondary | ICD-10-CM

## 2019-03-16 DIAGNOSIS — I6350 Cerebral infarction due to unspecified occlusion or stenosis of unspecified cerebral artery: Secondary | ICD-10-CM

## 2019-03-16 DIAGNOSIS — N760 Acute vaginitis: Secondary | ICD-10-CM

## 2019-03-16 DIAGNOSIS — E785 Hyperlipidemia, unspecified: Secondary | ICD-10-CM

## 2019-03-16 DIAGNOSIS — E663 Overweight: Secondary | ICD-10-CM

## 2019-03-16 LAB — LIPID PANEL
Cholesterol: 212 mg/dL — ABNORMAL HIGH (ref 0–200)
HDL: 38.8 mg/dL — ABNORMAL LOW (ref >39.00)
NonHDL: 173.34
Total CHOL/HDL Ratio: 5
Triglycerides: 330 mg/dL — ABNORMAL HIGH (ref 0.0–149.0)
VLDL: 66 mg/dL — ABNORMAL HIGH (ref 0.0–40.0)

## 2019-03-16 LAB — COMPREHENSIVE METABOLIC PANEL
ALT: 27 U/L (ref 0–35)
AST: 14 U/L (ref 0–37)
Albumin: 4.3 g/dL (ref 3.5–5.2)
Alkaline Phosphatase: 81 U/L (ref 39–117)
BUN: 22 mg/dL (ref 6–23)
CO2: 29 mEq/L (ref 19–32)
Calcium: 9.4 mg/dL (ref 8.4–10.5)
Chloride: 102 mEq/L (ref 96–112)
Creatinine, Ser: 0.78 mg/dL (ref 0.40–1.20)
GFR: 78.7 mL/min (ref >60.00)
Glucose, Bld: 98 mg/dL (ref 70–99)
Potassium: 4.1 mEq/L (ref 3.5–5.1)
Sodium: 140 mEq/L (ref 135–145)
Total Bilirubin: 0.4 mg/dL (ref 0.2–1.2)
Total Protein: 7.3 g/dL (ref 6.0–8.3)

## 2019-03-16 LAB — HEMOGLOBIN A1C: Hgb A1c MFr Bld: 6.1 % (ref 4.6–6.5)

## 2019-03-16 LAB — LDL CHOLESTEROL, DIRECT: Direct LDL: 117 mg/dL

## 2019-03-16 LAB — TSH: TSH: 0.04 u[IU]/mL — ABNORMAL LOW (ref 0.35–4.50)

## 2019-03-16 LAB — T4, FREE: Free T4: 1.02 ng/dL (ref 0.60–1.60)

## 2019-03-16 MED ORDER — FLUCONAZOLE 150 MG PO TABS: 150.0000 mg | ORAL_TABLET | Freq: Once | ORAL | 0 refills | Status: AC

## 2019-03-16 MED ORDER — LISINOPRIL-HYDROCHLOROTHIAZIDE 20-25 MG PO TABS: 1.0000 | ORAL_TABLET | Freq: Every day | ORAL | 1 refills | Status: DC

## 2019-03-16 MED ORDER — MONTELUKAST SODIUM 10 MG PO TABS: 10.0000 mg | ORAL_TABLET | Freq: Every day | ORAL | 3 refills | Status: DC

## 2019-03-16 MED ORDER — LEVOTHYROXINE SODIUM 175 MCG PO TABS: 175.0000 ug | ORAL_TABLET | Freq: Every day | ORAL | 3 refills | Status: DC

## 2019-03-16 MED ORDER — METFORMIN HCL 500 MG PO TABS: 500.0000 mg | ORAL_TABLET | Freq: Every day | ORAL | 1 refills | Status: DC

## 2019-03-16 MED ORDER — LEVOTHYROXINE SODIUM 200 MCG PO TABS: 200.0000 ug | ORAL_TABLET | Freq: Every day | ORAL | 3 refills | Status: DC

## 2019-03-16 MED ORDER — ATORVASTATIN CALCIUM 40 MG PO TABS: 40.0000 mg | ORAL_TABLET | Freq: Every day | ORAL | 1 refills | Status: DC

## 2019-03-16 NOTE — Progress Notes (Addendum)
This visit was conducted in person.  BP 116/80 (BP Location: Left Arm, Patient Position: Sitting, Cuff Size: Normal)   Pulse 81   Temp 97.9 F (36.6 C) (Tympanic)   Ht 5\' 2"  (1.575 m)   Wt 163 lb 9 oz (74.2 kg)   LMP 11/12/2011   SpO2 96%   BMI 29.92 kg/m    CC: CPE Subjective:    Patient ID: [name], female    DOB: 1971/08/22, 48 y.o.   MRN: [id number]  HPI: [name] is a 48 y.o. female presenting on 03/16/2019 for Annual Exam   Self pay patient. Previously seen here then [facility], now presented back this Spring to re-establish care.   S/p hysterectomy for fibroids, ovaries remain - since then, noticing increasing vaginal dryness and pruritis, without rash or skin changes. Using lajicam (miconazole) with benefit.   Seen 01/2019 - was undergoing treatment for diverticulitis with cipro/flagyl course. Symptoms never fully improved after treatment. She did have rpt colonoscopy 02/2019, and had flex sig 03/12/2019 with botox injections for poorly healing anal fissure. Placed on nifedipine/lidocaine compound cream BID x 1 month. Planned f/u with [facility] (Dr Sheral Flow). She has changed diet to clear liquids, high fiber diet - as well as miralax and benefiber, has limited other foods significantly.   HTN - compliant with lisinopril hctz 20/25mg  daily DM - taking metformin XR 500mg  daily.  Lab Results  Component Value Date   HGBA1C 6.1 03/16/2019  Hypothyroidism - compliant with levothyroxine 230mcg daily.  Lab Results  Component Value Date   TSH 0.04 (L) 03/16/2019  HLD - lipitor 40mg  restarted 2 months ago.  Remote history of cerebral infarct continues aspirin, restarted statin.  Asthma - requests singulair refilled. Declines albuterol inhaler refill.   Preventative: COLONOSCOPY 10/2014 1 hyperplastic polyp, rpt 10 yrs [facility]) Colonoscopy 02/2019 - anal fissure, polyps (adenoma), rpt 7 yrs ([facility]) Well woman - s/p hysterectomy 2013 ?fibroid. No  vaginal bleeding.  Breast cancer screening - overdue - we will look into pricing/discount/scholarship  Flu shot - not in season Td 2003, Tdap 01/2017 Seat belt use discussed Sunscreen use discussed. No changing moles on skin.  Non smoker No alcohol  Dentist - yearly  Eye exam - has not seen.   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 Interim medical history since our last visit reviewed. Allergies and medications reviewed and updated. Outpatient Medications Prior to Visit  Medication Sig Dispense Refill  . aspirin 81 MG tablet Take 1 tablet (81 mg total) by mouth daily.    Marland Kitchen CALCIUM PO Take 1 tablet by mouth daily.    Marland Kitchen dicyclomine (BENTYL) 10 MG capsule Take 1 capsule by mouth 3 (three) times daily. Takes 3 times daily before a meal    . hydrocortisone 2.5 % cream Apply 1 application topically 2 (two) times a day.    . lidocaine (XYLOCAINE) 5 % ointment Apply 1 application topically 2 (two) times daily as needed for pain.    . NONFORMULARY OR COMPOUNDED ITEM Apply 1 application topically 2 (two) times daily. Nifedipine 0.3% lidocaine 1.5% in petrolatum ointment.    . Polyethylene Glycol 3350 (MIRALAX PO) Take by mouth daily. Takes 1 capful daily    . atorvastatin (  LIPITOR) 40 MG tablet Take 1 tablet (40 mg total) by mouth daily. 90 tablet 1  . levothyroxine (SYNTHROID) 200 MCG tablet TAKE 1 TABLET BY MOUTH ONCE DAILY BEFORE BREAKFAST (NEW  DOSE) 90 tablet 0  . lisinopril-hydrochlorothiazide (ZESTORETIC) 20-25 MG tablet Take 1 tablet by mouth once daily 90 tablet 0  . metFORMIN (GLUCOPHAGE-XR) 500 MG 24 hr tablet Take 1 tablet by mouth once daily with breakfast 90 tablet 0  . fenofibrate micronized (LOFIBRA) 67 MG capsule Take  1 capsule (67 mg total) by mouth daily before breakfast. 30 capsule 11   No facility-administered medications prior to visit.      Per HPI unless specifically indicated in ROS section below Review of Systems  Constitutional: Negative for activity change, appetite change, chills, fatigue, fever and unexpected weight change.  HENT: Negative for hearing loss.   Eyes: Negative for visual disturbance.  Respiratory: Positive for wheezing (triggers for asthma are grass mowing). Negative for cough, chest tightness and shortness of breath.   Cardiovascular: Negative for chest pain, palpitations and leg swelling.  Gastrointestinal: Positive for diarrhea (occasional - see HPI). Negative for abdominal distention, abdominal pain, blood in stool, constipation, nausea and vomiting.  Genitourinary: Negative for difficulty urinating and hematuria.  Musculoskeletal: Negative for arthralgias, myalgias and neck pain.  Skin: Negative for rash.  Neurological: Negative for dizziness, seizures, syncope and headaches.  Hematological: Negative for adenopathy. Does not bruise/bleed easily.  Psychiatric/Behavioral: Negative for dysphoric mood. The patient is not nervous/anxious.    Objective:    BP 116/80 (BP Location: Left Arm, Patient Position: Sitting, Cuff Size: Normal)   Pulse 81   Temp 97.9 F (36.6 C) (Tympanic)   Ht 5\' 2"  (1.575 m)   Wt 163 lb 9 oz (74.2 kg)   LMP 11/12/2011   SpO2 96%   BMI 29.92 kg/m   Wt Readings from Last 3 Encounters:  03/16/19 163 lb 9 oz (74.2 kg)  07/20/18 160 lb (72.6 kg)  07/02/17 163 lb 4 oz (74 kg)    Physical Exam Vitals signs and nursing note reviewed. Exam conducted with a chaperone present.  Constitutional:      General: She is not in acute distress.    Appearance: Normal appearance. She is well-developed. She is not ill-appearing.  HENT:     Head: Normocephalic and atraumatic.     Right Ear: Hearing, tympanic membrane, ear canal and external ear normal.      Left Ear: Hearing, tympanic membrane, ear canal and external ear normal.     Nose: Nose normal.     Mouth/Throat:     Mouth: Mucous membranes are moist.     Pharynx: Uvula midline. No oropharyngeal exudate or posterior oropharyngeal erythema.  Eyes:     General: No scleral icterus.    Extraocular Movements: Extraocular movements intact.     Conjunctiva/sclera: Conjunctivae normal.     Pupils: Pupils are equal, round, and reactive to light.  Neck:     Musculoskeletal: Normal range of motion and neck supple.  Cardiovascular:     Rate and Rhythm: Normal rate and regular rhythm.     Pulses: Normal pulses.          Radial pulses are 2+ on the right side and 2+ on the left side.     Heart sounds: Normal heart sounds. No murmur.  Pulmonary:     Effort: Pulmonary effort is normal. No respiratory distress.     Breath sounds: Normal breath sounds. No wheezing, rhonchi or  rales.  Abdominal:     General: Bowel sounds are normal. There is no distension.     Palpations: Abdomen is soft. There is no mass.     Tenderness: There is no abdominal tenderness. There is no guarding or rebound.     Hernia: No hernia is present.  Genitourinary:    Exam position: Supine.     Pubic Area: Rash (mild erythema, swelling) present.     Labia:        Right: Rash present. No tenderness, lesion or injury.        Left: Rash present. No tenderness, lesion or injury.      Urethra: No prolapse or urethral pain.     Vagina: Vaginal discharge (mild white) present.     Uterus: Absent.      Comments: Uterus absent Musculoskeletal: Normal range of motion.     Right lower leg: No edema.     Left lower leg: No edema.  Lymphadenopathy:     Cervical: No cervical adenopathy.  Skin:    General: Skin is warm and dry.     Findings: No rash.  Neurological:     General: No focal deficit present.     Mental Status: She is alert and oriented to person, place, and time.     Comments: CN grossly intact, station and gait intact   Psychiatric:        Mood and Affect: Mood normal.        Behavior: Behavior normal.        Thought Content: Thought content normal.        Judgment: Judgment normal.       Results for orders placed or performed in visit on 03/16/19  T4, free  Result Value Ref Range   Free T4 1.02 0.60 - 1.60 ng/dL  Hemoglobin A1c  Result Value Ref Range   Hgb A1c MFr Bld 6.1 4.6 - 6.5 %  TSH  Result Value Ref Range   TSH 0.04 (L) 0.35 - 4.50 uIU/mL  Comprehensive metabolic panel  Result Value Ref Range   Sodium 140 135 - 145 mEq/L   Potassium 4.1 3.5 - 5.1 mEq/L   Chloride 102 96 - 112 mEq/L   CO2 29 19 - 32 mEq/L   Glucose, Bld 98 70 - 99 mg/dL   BUN 22 6 - 23 mg/dL   Creatinine, Ser 0.78 0.40 - 1.20 mg/dL   Total Bilirubin 0.4 0.2 - 1.2 mg/dL   Alkaline Phosphatase 81 39 - 117 U/L   AST 14 0 - 37 U/L   ALT 27 0 - 35 U/L   Total Protein 7.3 6.0 - 8.3 g/dL   Albumin 4.3 3.5 - 5.2 g/dL   Calcium 9.4 8.4 - 10.5 mg/dL   GFR 78.70 >60.00 mL/min  Lipid panel  Result Value Ref Range   Cholesterol 212 (H) 0 - 200 mg/dL   Triglycerides 330.0 (H) 0.0 - 149.0 mg/dL   HDL 38.80 (L) >39.00 mg/dL   VLDL 66.0 (H) 0.0 - 40.0 mg/dL   Total CHOL/HDL Ratio 5    NonHDL 173.34   LDL cholesterol, direct  Result Value Ref Range   Direct LDL 117.0 mg/dL   Assessment & Plan:  F/u pending lab results Problem List Items Addressed This Visit    Vulvovaginitis    Anticipate candidal, not responding to topical myconazole. Will Rx diflucan 150mg  x2. Update with effect.       Overweight (BMI 25.0-29.9)   Hypothyroidism  Chronic, update labs.       Relevant Medications   levothyroxine (SYNTHROID) 175 MCG tablet   HYPERTENSION, BENIGN SYSTEMIC    Chronic, stable. Continue current regimen.       Relevant Medications   lisinopril-hydrochlorothiazide (ZESTORETIC) 20-25 MG tablet   atorvastatin (LIPITOR) 40 MG tablet   HLD (hyperlipidemia)    Has not restarted statin - will refill today. The  ASCVD Risk score Mikey Bussing DC Jr., et al., 2013) failed to calculate for the following reasons:   The patient has a prior MI or stroke diagnosis       Relevant Medications   lisinopril-hydrochlorothiazide (ZESTORETIC) 20-25 MG tablet   atorvastatin (LIPITOR) 40 MG tablet   Encounter for well adult exam with abnormal findings - Primary    Preventative protocols reviewed and updated unless pt declined. Discussed healthy diet and lifestyle.       Controlled diabetes mellitus type 2 with complications (HCC)    Metformin XR is on recall - will start IR.  Update A1c today.       Relevant Medications   lisinopril-hydrochlorothiazide (ZESTORETIC) 20-25 MG tablet   metFORMIN (GLUCOPHAGE) 500 MG tablet   atorvastatin (LIPITOR) 40 MG tablet   Chronic anal fissure    Followed by GI, appreciate their care, s/p botox injection. Reviewed nifedipine cream use. She will f/u with GI.       Cerebral artery occlusion with cerebral infarction (HCC)    Continue aspirin, rec restart statin.       Relevant Medications   lisinopril-hydrochlorothiazide (ZESTORETIC) 20-25 MG tablet   atorvastatin (LIPITOR) 40 MG tablet   Asthma    singulair refilled per pt request.       Relevant Medications   montelukast (SINGULAIR) 10 MG tablet    Other Visit Diagnoses    Breast cancer screening       Relevant Orders   MM 3D SCREEN BREAST BILATERAL       Meds ordered this encounter  Medications  . lisinopril-hydrochlorothiazide (ZESTORETIC) 20-25 MG tablet    Sig: Take 1 tablet by mouth daily.    Dispense:  90 tablet    Refill:  1  . DISCONTD: levothyroxine (SYNTHROID) 200 MCG tablet    Sig: Take 1 tablet (200 mcg total) by mouth daily before breakfast.    Dispense:  90 tablet    Refill:  3  . metFORMIN (GLUCOPHAGE) 500 MG tablet    Sig: Take 1 tablet (500 mg total) by mouth daily with breakfast.    Dispense:  90 tablet    Refill:  1    In place of metformin XR  . atorvastatin (LIPITOR) 40 MG tablet     Sig: Take 1 tablet (40 mg total) by mouth daily.    Dispense:  90 tablet    Refill:  1  . montelukast (SINGULAIR) 10 MG tablet    Sig: Take 1 tablet (10 mg total) by mouth at bedtime.    Dispense:  90 tablet    Refill:  3  . fluconazole (DIFLUCAN) 150 MG tablet    Sig: Take 1 tablet (150 mg total) by mouth once for 1 dose. Repeat in 4 days    Dispense:  2 tablet    Refill:  0  . levothyroxine (SYNTHROID) 175 MCG tablet    Sig: Take 1 tablet (175 mcg total) by mouth daily before breakfast.    Dispense:  90 tablet    Refill:  3    Note new dose  Orders Placed This Encounter  Procedures  . MM 3D SCREEN BREAST BILATERAL    Standing Status:   Future    Standing Expiration Date:   05/16/2020    Order Specific Question:   Reason for Exam (SYMPTOM  OR DIAGNOSIS REQUIRED)    Answer:   breast cancer screening    Order Specific Question:   Is the patient pregnant?    Answer:   No    Order Specific Question:   Preferred imaging location?    Answer:   [facility]  . LDL cholesterol, direct    Follow up plan: No follow-ups on file.  Ria Bush, MD

## 2019-03-16 NOTE — Addendum Note (Signed)
Addended by: Ria Bush on: 03/16/2019 05:24 PM   Modules accepted: Orders

## 2019-03-16 NOTE — Assessment & Plan Note (Signed)
Anticipate candidal, not responding to topical myconazole. Will Rx diflucan 150mg  x2. Update with effect.

## 2019-03-16 NOTE — Assessment & Plan Note (Signed)
Followed by GI, appreciate their care, s/p botox injection. Reviewed nifedipine cream use. She will f/u with GI.

## 2019-03-16 NOTE — Assessment & Plan Note (Signed)
Has not restarted statin - will refill today. The ASCVD Risk score Mikey Bussing DC Jr., et al., 2013) failed to calculate for the following reasons:   The patient has a prior MI or stroke diagnosis

## 2019-03-16 NOTE — Assessment & Plan Note (Signed)
Continue aspirin, rec restart statin.

## 2019-03-16 NOTE — Assessment & Plan Note (Signed)
Metformin XR is on recall - will start IR.  Update A1c today.

## 2019-03-16 NOTE — Patient Instructions (Signed)
Gusto verla hoy. Siga con crema de nifedipine para fissura.  Veremos si es candidata para beca para mamograma.  Laboratorios hoy.  Siga mismas medicinas - rellenadas hoy  Mantenimiento de Technical sales engineer en las [employer], Female Adoptar un estilo de vida saludable y recibir atencin preventiva son importantes para promover la salud y Musician. Consulte al mdico sobre:  El esquema adecuado para hacerse pruebas y exmenes peridicos.  Cosas que puede hacer por su cuenta para prevenir enfermedades y [employer]. Qu debo saber sobre la dieta, el peso y el ejercicio? Consuma una dieta saludable   Consuma una dieta que incluya muchas verduras, frutas, productos lcteos con bajo contenido de [place] y Advertising account planner.  No consuma muchos alimentos ricos en grasas slidas, azcares agregados o sodio. Mantenga un peso saludable El ndice de masa muscular [facility]) se [place] para identificar problemas de [place]. Proporciona una estimacin de la grasa corporal basndose en el peso y la altura. Su mdico puede ayudarle a Radiation protection practitioner [place] y a Scientist, forensic o Theatre manager un peso saludable. Haga ejercicio con regularidad Haga ejercicio con regularidad. Esta es una de las prcticas ms importantes que puede hacer por su salud. La mayora de los adultos deben seguir estas pautas:  Optometrist, al menos, 144minutos de actividad fsica por semana. El ejercicio debe aumentar la frecuencia cardaca y Nature conservation officer transpirar (ejercicio de intensidad moderada).  Hacer ejercicios de fortalecimiento por lo [employer] por semana. Agregue esto a su plan de ejercicio de intensidad moderada.  Pasar menos tiempo sentados. Incluso la actividad fsica ligera puede ser beneficiosa. Controle sus niveles de colesterol y lpidos en la sangre Comience a realizarse anlisis de lpidos y Research officer, trade union en la sangre a los 20aos y luego reptalos cada 5aos. Hgase controlar los niveles de colesterol con mayor frecuencia  si:  Sus niveles de lpidos y colesterol son altos.  Es mayor de 40aos.  Presenta un alto riesgo de padecer enfermedades cardacas. Qu debo saber sobre las pruebas de deteccin del cncer? Segn su historia clnica y sus antecedentes familiares, es posible que deba realizarse pruebas de deteccin del cncer en diferentes edades. Esto puede incluir pruebas de deteccin de lo siguiente:  Cncer de mama.  Cncer de cuello uterino.  Cncer colorrectal.  Cncer de piel.  Cncer de pulmn. Qu debo saber sobre la enfermedad cardaca, la diabetes y la hipertensin arterial? Presin arterial y enfermedad cardaca  La hipertensin arterial causa enfermedades cardacas y [place] el riesgo de accidente cerebrovascular. Es ms probable que esto se manifieste en las personas que tienen lecturas de presin arterial alta, tienen ascendencia africana o tienen sobrepeso.  Hgase controlar la presin arterial: ? Cada 3 a 5 aos si tiene entre 18 y 74 aos. ? Todos los aos si es mayor de [place]. Diabetes Realcese exmenes de deteccin de la diabetes con regularidad. Este anlisis revisa el nivel de azcar en la sangre en [place]. Hgase las pruebas de deteccin:  Cada tresaos despus de los 43aos de edad si tiene un peso normal y un bajo riesgo de padecer diabetes.  Con ms frecuencia y a partir de [place] edad inferior si tiene sobrepeso o un alto riesgo de padecer diabetes. Qu debo saber sobre la prevencin de infecciones? Hepatitis B Si tiene un riesgo ms alto de contraer hepatitis B, debe someterse a un examen de deteccin de este virus. Hable con el mdico para averiguar si tiene riesgo de contraer la infeccin por hepatitis B. Hepatitis C Se recomienda el anlisis a:  Todos los que nacieron entre 1945 y [phone].  Todas las personas que tengan un riesgo de haber contrado hepatitis C. Enfermedades de transmisin sexual (ETS)  Hgase las pruebas de Programme researcher, broadcasting/film/video de ITS, incluidas la  gonorrea y la clamidia, si: ? Es sexualmente activa y es menor de [place]. ? Es mayor de 24aos, y Investment banker, operational informa que corre riesgo de tener este tipo de infecciones. ? La actividad sexual ha cambiado desde que le hicieron la ltima prueba de deteccin y tiene un riesgo mayor de Best boy clamidia o Radio broadcast assistant. Pregntele al mdico si usted tiene riesgo.  Pregntele al mdico si usted tiene un alto riesgo de Museum/gallery curator VIH. El mdico tambin puede recomendarle un medicamento recetado para ayudar a evitar la infeccin por el VIH. Si elige tomar medicamentos para prevenir el VIH, primero debe [employer] de deteccin del VIH. Luego debe hacerse anlisis cada 36meses mientras est tomando los medicamentos. Embarazo  Si est por dejar de Librarian, academic (fase premenopusica) y usted puede quedar [place], busque asesoramiento antes de [place].  Tome de 400 a [id number] (mcg) de cido [employer] si [place].  Pida mtodos de control de la natalidad (anticonceptivos) si desea evitar un embarazo no deseado. Osteoporosis y [place] La osteoporosis es una enfermedad en la que los huesos pierden los minerales y la fuerza por el avance de la edad. El resultado pueden ser fracturas en los [place]. Si tiene 65aos o ms, o si est en riesgo de sufrir osteoporosis y fracturas, pregunte a su mdico si debe:  Hacerse pruebas de deteccin de prdida sea.  Tomar un suplemento de calcio o de vitamina D para reducir el riesgo de fracturas.  Recibir terapia de reemplazo hormonal (TRH) para tratar los sntomas de la menopausia. Siga estas instrucciones en su casa: Estilo de vida  No consuma ningn producto que contenga nicotina o tabaco, como cigarrillos, cigarrillos electrnicos y tabaco de Higher education careers adviser. Si necesita ayuda para dejar de fumar, consulte al mdico.  No consuma drogas.  No comparta agujas.  Solicite ayuda a su mdico si necesita apoyo o informacin para  abandonar las drogas. Consumo de alcohol  No beba alcohol si: ? Su mdico le indica no hacerlo. ? Est embarazada, puede estar embarazada o est tratando de quedar embarazada.  Si bebe alcohol: ? Limite la cantidad que consume de 0 a 1 medida por da. ? Limite la ingesta si est amamantando.  Est atento a la cantidad de alcohol que hay en las bebidas que toma. En los [place], una medida equivale a una botella de cerveza de 12oz (334ml), un vaso de vino de 5oz (179ml) o un vaso de una bebida alcohlica de alta graduacin de 1oz (82ml). Instrucciones generales  Realcese los estudios de rutina de la salud, dentales y de Public librarian.  [place].  Infrmele a su mdico si: ? Se siente deprimida con frecuencia. ? Alguna vez ha sido vctima de [place] o no se siente segura en su casa. Resumen  Adoptar un estilo de vida saludable y recibir atencin preventiva son importantes para promover la salud y Musician.  Siga las instrucciones del mdico acerca de una dieta saludable, el ejercicio y la realizacin de pruebas o exmenes para Engineer, building services.  Siga las instrucciones del mdico con respecto al control del colesterol y la presin arterial. Esta informacin no tiene Marine scientist el consejo del mdico. Asegrese de hacerle al mdico cualquier pregunta que tenga. Document Released:  08/09/2011 Document Revised: 09/10/2018 Document Reviewed: 09/10/2018 Elsevier Patient Education  [place].

## 2019-03-16 NOTE — Assessment & Plan Note (Signed)
singulair refilled per pt request.

## 2019-03-16 NOTE — Assessment & Plan Note (Signed)
Preventative protocols reviewed and updated unless pt declined. Discussed healthy diet and lifestyle.  

## 2019-03-16 NOTE — Assessment & Plan Note (Signed)
Chronic, update labs.

## 2019-03-16 NOTE — Assessment & Plan Note (Signed)
Chronic, stable. Continue current regimen. 

## 2019-03-17 ENCOUNTER — Telehealth: Payer: Self-pay

## 2019-03-17 NOTE — Telephone Encounter (Signed)
Left message on vm per dpr asking pt to call back.  Needs to schedule a 2 mo thyroid lab visit.  [See labs, 7/132/20.]

## 2019-03-18 NOTE — Telephone Encounter (Signed)
Pt returning call.  Scheduled 2 mo lab visit on 05/19/19 at 8:35.

## 2019-03-18 NOTE — Telephone Encounter (Signed)
Left message on vm per dpr asking pt to call back.  Needs to schedule a 2 mo thyroid lab visit.  [See labs, 7/132/20.]

## 2019-03-24 ENCOUNTER — Telehealth: Payer: Self-pay | Admitting: Family Medicine

## 2019-03-24 NOTE — Telephone Encounter (Signed)
Patient's husband called and stated that the patient has several different prescriptions waiting at the pharmacy that was recently sent.  They did not know the name of the medications or what they were for since they did not pick these up   Husband stated that this prescription were to expensive  and would like to know if something else could be called in   Please advise  Husband's C/B # [phone]

## 2019-03-24 NOTE — Telephone Encounter (Signed)
Patient's husband called and wanted to change call back number to [phone].

## 2019-03-25 NOTE — Telephone Encounter (Signed)
Spoke with patient husband, states that the patient's Medication cost is to high - requesting an alternative for the Singulair which is too expensive. Aware that I will call the pharmacy and see about the cost and if any other options.   I called [facility] and was able to get a Singulair Discount card for #90 days, $17.94  Atorvastatin $38 for 90 day Synthriod $10 for 90 day Metformin $6 for 90 day Lisinopril/HCTZ $10 for 90 day  Spoke again with patient husband, he is okay with the cost of these medications and plans to pick them up tomorrow.   Nothing further needed.

## 2019-04-02 ENCOUNTER — Other Ambulatory Visit: Payer: Self-pay

## 2019-04-02 ENCOUNTER — Ambulatory Visit
Admission: RE | Admit: 2019-04-02 | Discharge: 2019-04-02 | Disposition: A | Discharge status: Home / Self Care | Payer: No Typology Code available for payment source | Source: Ambulatory Visit | Attending: Family Medicine | Admitting: Family Medicine

## 2019-04-02 DIAGNOSIS — Z1239 Encounter for other screening for malignant neoplasm of breast: Secondary | ICD-10-CM

## 2019-04-03 LAB — HM MAMMOGRAPHY

## 2019-04-06 ENCOUNTER — Other Ambulatory Visit: Payer: Self-pay | Admitting: Obstetrics and Gynecology

## 2019-04-06 ENCOUNTER — Other Ambulatory Visit: Payer: Self-pay | Admitting: Family Medicine

## 2019-04-06 ENCOUNTER — Encounter: Payer: Self-pay | Admitting: Family Medicine

## 2019-04-06 DIAGNOSIS — R928 Other abnormal and inconclusive findings on diagnostic imaging of breast: Secondary | ICD-10-CM

## 2019-04-08 ENCOUNTER — Other Ambulatory Visit (HOSPITAL_COMMUNITY): Payer: Self-pay | Admitting: *Deleted

## 2019-04-08 DIAGNOSIS — R928 Other abnormal and inconclusive findings on diagnostic imaging of breast: Secondary | ICD-10-CM

## 2019-04-28 ENCOUNTER — Ambulatory Visit (HOSPITAL_COMMUNITY): Payer: No Typology Code available for payment source

## 2019-04-28 ENCOUNTER — Other Ambulatory Visit: Payer: No Typology Code available for payment source

## 2019-05-05 HISTORY — PX: COLONOSCOPY: SHX174

## 2019-05-18 ENCOUNTER — Other Ambulatory Visit: Payer: Self-pay | Admitting: Family Medicine

## 2019-05-18 DIAGNOSIS — E039 Hypothyroidism, unspecified: Secondary | ICD-10-CM

## 2019-05-19 ENCOUNTER — Other Ambulatory Visit: Payer: No Typology Code available for payment source

## 2019-05-21 ENCOUNTER — Ambulatory Visit (HOSPITAL_COMMUNITY): Payer: No Typology Code available for payment source

## 2019-07-23 ENCOUNTER — Ambulatory Visit
Admission: RE | Admit: 2019-07-23 | Discharge: 2019-07-23 | Disposition: A | Discharge status: Home / Self Care | Payer: No Typology Code available for payment source | Source: Ambulatory Visit | Attending: Obstetrics and Gynecology | Admitting: Obstetrics and Gynecology

## 2019-07-23 ENCOUNTER — Encounter (HOSPITAL_COMMUNITY): Payer: Self-pay

## 2019-07-23 ENCOUNTER — Telehealth: Payer: Self-pay | Admitting: Obstetrics & Gynecology

## 2019-07-23 ENCOUNTER — Other Ambulatory Visit: Payer: Self-pay

## 2019-07-23 ENCOUNTER — Ambulatory Visit (HOSPITAL_COMMUNITY)
Admission: RE | Admit: 2019-07-23 | Discharge: 2019-07-23 | Disposition: A | Discharge status: Home / Self Care | Payer: No Typology Code available for payment source | Source: Ambulatory Visit | Attending: Obstetrics and Gynecology | Admitting: Obstetrics and Gynecology

## 2019-07-23 ENCOUNTER — Other Ambulatory Visit: Payer: Self-pay | Admitting: Obstetrics and Gynecology

## 2019-07-23 DIAGNOSIS — R928 Other abnormal and inconclusive findings on diagnostic imaging of breast: Secondary | ICD-10-CM

## 2019-07-23 DIAGNOSIS — N644 Mastodynia: Secondary | ICD-10-CM | POA: Insufficient documentation

## 2019-07-23 DIAGNOSIS — Z1239 Encounter for other screening for malignant neoplasm of breast: Secondary | ICD-10-CM

## 2019-07-23 NOTE — Patient Instructions (Signed)
Explained breast self awareness with [name] [employer]. Patient did not need a Pap smear today due to last Pap smear was in July 2020 per patient and patient has a history of a hysterectomy for benign reasons. Let patient know that she doesn't need any further Pap smears due to her history of a hysterectomy for benign reasons.  Referred patient to the [facility] for a left breast diagnostic mammogram and possible ultrasound per recommendation. Appointment scheduled for Thursday, July 23, 2019 at 1050. Patient aware of appointment and will be there. [name] [name] verbalized understanding.  [name], Arvil [place], RN 9:37 AM

## 2019-07-23 NOTE — Telephone Encounter (Signed)
Called patient w/ Spanish interpreter Raquel to give patient her appointment on 09/14/19 @ 8:55. Patient verbalized understanding.

## 2019-07-23 NOTE — Progress Notes (Addendum)
Patient referred to [facility] by the [place] due to recommending additional imaging of the left breast. Screening mammogram completed 04/02/2019.   Patient complained of left breast upper and outer pain x 6 months that comes and goes. Patient rates the pain at a 5 out of 10.  Patient complained of bilateral ovarian pain. Referred patient to the [facility] for follow-up. Told patient the follow-up visit will not be covered by [facility] and patient given the information about the [facility].  Pap Smear: Pap smear not completed today. Last Pap smear was in July 2020 by Dr. Orlie Dakin and normal per patient. Per patient has no history of an abnormal Pap smear. Patient has a history of a hysterectomy 04/24/2012 due to AUB. Patient doesn't need any further Pap smears due to her history of a hysterectomy for benign reasons per BCCCP and ACOG guidelines. Last Pap smear result is not in Epic. Previous Pap smear 03/31/2012 and hysterectomy results are in [place].  Physical exam: Breasts Breasts symmetrical. No skin abnormalities bilateral breasts. No nipple retraction bilateral breasts. No nipple discharge bilateral breasts. No lymphadenopathy. No lumps palpated bilateral breasts. Complaints of left outer breast pain on exam. Referred patient to the [place] for a left breast diagnostic mammogram and possible ultrasound per recommendation. Appointment scheduled for Thursday, July 23, 2019 at 1050.        Pelvic/Bimanual No Pap smear completed today since last Pap smear was in July 2020 per patient and patient has a history of a hysterectomy for benign reasons. Pap smear not indicated per BCCCP guidelines.   Smoking History: Patient has never smoked.  Patient Navigation: Patient education provided. Access to services provided for patient through [facility] program. Spanish interpreter provided.   Colorectal Cancer Screening: Per  patient had a colonoscopy completed in June 2020. No complaints today.   Breast and Cervical Cancer Risk Assessment: Patient has no family history of breast cancer, known genetic mutations, or radiation treatment to the chest before age 42. Patient has no history of cervical dysplasia, immunocompromised, or DES exposure in-utero.  Risk Assessment    Risk Scores      07/23/2019   Last edited by: Loletta Parish, RN   5-year risk: 0.9 %   Lifetime risk: 8.9 %         Used Spanish interpreter Rudene Anda from [employer].

## 2019-07-23 NOTE — Addendum Note (Signed)
Encounter addended by: Loletta Parish, RN on: 07/23/2019 10:19 AM  Actions taken: Clinical Note Signed

## 2019-07-24 ENCOUNTER — Telehealth: Payer: Self-pay

## 2019-07-24 NOTE — Telephone Encounter (Signed)
[place] Night - Client Nonclinical Telephone Record AccessNurse Client [facility] Night - Client Client Site [place] Physician Ria Bush - MD Contact Type Call Who Is Calling Patient / Member / Family / Caregiver Caller Name [place] Phone Number [phone] Patient Name [name] Patient DOB 07-02-1971 Call Type Message Only Information Provided Reason for Call Request for General Office Information Initial Comment Caller states they would like to pass a message to MD. 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 States they would like to find out what they should do next. Additional Comment Declined triage. Office hours provided. Disp. Time Disposition Final User 07/23/2019 5:50:09 PM General Information Provided Yes Arnaldo Natal Call Closed By: Arnaldo Natal Transaction Date/Time: 07/23/2019 5:42:38 PM (ET)

## 2019-07-24 NOTE — Telephone Encounter (Signed)
Called patient - reviewed recent reassuring mammo/US results.

## 2019-07-24 NOTE — Telephone Encounter (Signed)
I spoke with pt who wants to speak with someone who speaks spanish and then pt handed phone to her husband who said he is not sure what is going on and wants cb from Dr Darnell Level to speak in [place]. If I understood pts husband the pt found the tumor on 07/23/19. Wants to talk to Dr Darnell Level before dong anything else. FYI to Dr Baldwin Crown CMA.

## 2019-09-14 ENCOUNTER — Ambulatory Visit: Payer: No Typology Code available for payment source | Admitting: Obstetrics & Gynecology

## 2019-09-16 ENCOUNTER — Ambulatory Visit: Payer: No Typology Code available for payment source | Admitting: Certified Nurse Midwife

## 2019-10-06 ENCOUNTER — Telehealth: Payer: Self-pay

## 2019-10-06 DIAGNOSIS — E039 Hypothyroidism, unspecified: Secondary | ICD-10-CM

## 2019-10-06 NOTE — Telephone Encounter (Addendum)
Ok to change. plz notify pharmacy and pt and schedule 6 wk labs after switch.  Can you use translator line?

## 2019-10-06 NOTE — Addendum Note (Signed)
Addended by: Ria Bush on: 10/06/2019 06:06 PM   Modules accepted: Orders

## 2019-10-06 NOTE — Telephone Encounter (Signed)
Received fax from [facility] asking if it is ok to change Levothyroxine brand from Sandoz to Ehrenfeld brand.

## 2019-10-07 NOTE — Telephone Encounter (Addendum)
Spoke with [facility] notifying them, per Dr. Darnell Level, it is ok to change manufacturer.  Says they will document and fill for pt.   Spoke with pt notifying her of change.  Verbalizes understanding.  Scheduled lab visit on 11/19/19 at 8:15.

## 2019-10-14 ENCOUNTER — Other Ambulatory Visit: Payer: Self-pay

## 2019-10-14 ENCOUNTER — Ambulatory Visit (INDEPENDENT_AMBULATORY_CARE_PROVIDER_SITE_OTHER): Payer: Self-pay | Admitting: Obstetrics & Gynecology

## 2019-10-14 ENCOUNTER — Encounter: Payer: Self-pay | Admitting: Obstetrics & Gynecology

## 2019-10-14 VITALS — BP 152/87 | HR 78 | Wt 170.8 lb

## 2019-10-14 DIAGNOSIS — B3731 Acute candidiasis of vulva and vagina: Secondary | ICD-10-CM

## 2019-10-14 DIAGNOSIS — Z113 Encounter for screening for infections with a predominantly sexual mode of transmission: Secondary | ICD-10-CM

## 2019-10-14 DIAGNOSIS — R102 Pelvic and perineal pain: Secondary | ICD-10-CM

## 2019-10-14 DIAGNOSIS — N941 Unspecified dyspareunia: Secondary | ICD-10-CM

## 2019-10-14 DIAGNOSIS — N898 Other specified noninflammatory disorders of vagina: Secondary | ICD-10-CM

## 2019-10-14 DIAGNOSIS — B373 Candidiasis of vulva and vagina: Secondary | ICD-10-CM

## 2019-10-14 DIAGNOSIS — N83292 Other ovarian cyst, left side: Secondary | ICD-10-CM

## 2019-10-14 NOTE — Progress Notes (Signed)
GYNECOLOGY OFFICE VISIT NOTE  History:   [name] is a 49 y.o. [phone] here today for evaluation of continued pelvic pain, dyspareunia and vaginal dryness.  Was evaluated for the pain in 2017, pelvic ultrasound was not significant. History of hysterectomy for benign indications.   Patient is Spanish-speaking only, Spanish interpreter present for this encounter.  She reports that she had diffuse abdominal pain and bowel issues last year, did not have intercourse because of this for several months.  When the bowel issues were resolved, she continued to have pain when she resumed intercourse.  It is very painful in her vagina and radiates throughout lower abdomen, associated with vaginal dryness.  She denies any abnormal vaginal discharge, bleeding or other concerns.    Past Medical History:  Diagnosis Date  . ABORTION, SPONTANEOUS 08/18/2007  . Acquired hypothyroidism   . Anemia   . Asthma   . Complication of anesthesia   . 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 Ectopic pregnancy 04/2006   treated with medication  . GERD (gastroesophageal reflux disease)   . Hepatic steatosis 07/2006, 04/2014   on Abd CT done for pain  . Hypertension   . Hyperthyroidism 10/2000   RAI treatment,  multinodular goiter  . IMPAIRED GLUCOSE TOLERANCE 08/18/2007  . Migraine    "had them often; many years" (03/13/2016)  . Ovarian cyst 04/2014  . PONV (postoperative nausea and vomiting)   . Preterm delivery 12/2010   x3, placental abruption, maternal htn, DM  . Type II diabetes mellitus ([place]) dx'd 12/2015    Past Surgical History:  Procedure Laterality Date  . ABDOMINAL HYSTERECTOMY  04/24/2012   Procedure: HYSTERECTOMY ABDOMINAL;  Surgeon: Terrance Mass, MD;  Location: [facility];  Service: Gynecology;;  . [place]  . CESAREAN SECTION  2012  . CHOLECYSTECTOMY  1997  . COLONOSCOPY  10/2014   1 hyperplastic polyp, rpt 10 yrs  Henrene Pastor)  . COLONOSCOPY  02/2019   anal fissure, polyps (TA, HP), rpt 7 yrs ([facility] @ [facility])  . ESOPHAGOGASTRODUODENOSCOPY  10/2014   WNL  . EXCISION OF ENDOMETRIOMA N/A 08/20/2016   Procedure: EXCISION OF ENDOMETRIOMA LEFT LOWER QUADRANT;  Surgeon: Georganna Skeans, MD;  Location: [facility];  Service: General;  Laterality: N/A;  . FLEXIBLE SIGMOIDOSCOPY  03/2019   s/p botox injection for anal fissure ([facility] @ [facility])  . LAPAROSCOPIC HYSTERECTOMY  04/24/2012   Procedure: HYSTERECTOMY TOTAL LAPAROSCOPIC;  Surgeon: Terrance Mass, MD;  Location: [facility];  Service: Gynecology;  Laterality: N/A;  2 1/2 hours OR time.  Dr. Phineas Real to assisting   . LAPAROSCOPIC LYSIS INTESTINAL ADHESIONS  2013  . OVARIAN CYST REMOVAL  2012  . THYROID SURGERY  ~ 2000   goiter removed    The following portions of the patient's history were reviewed and updated as appropriate: allergies, current medications, past family history, past medical history, past social history, past surgical history and problem list.    Review of Systems:  Pertinent items noted in HPI and remainder of comprehensive ROS otherwise negative.  Physical Exam:  BP (!) 152/87   Pulse 78   Wt 170 lb 12.8 oz (77.5 kg)   LMP 11/12/2011   BMI 31.24 kg/m  CONSTITUTIONAL: Well-developed, well-nourished female in no acute distress.  HEENT:  Normocephalic, atraumatic. External right and left ear normal. No scleral icterus.  NECK: Normal range of motion, supple, no masses noted on  observation SKIN: No rash noted. Not diaphoretic. No erythema. No pallor. MUSCULOSKELETAL: Normal range of motion. No edema noted. NEUROLOGIC: Alert and oriented to person, place, and time. Normal muscle tone coordination. No cranial nerve deficit noted. PSYCHIATRIC: Normal mood and affect. Normal behavior. Normal judgment and thought content. CARDIOVASCULAR: Normal heart rate noted RESPIRATORY: Effort and breath sounds normal, no problems with  respiration noted ABDOMEN: No masses noted. No other overt distention noted.  Diffuse mild TTP, and moderate TTP in lower abdomen. PELVIC: Normal appearing external genitalia; normal appearing vaginal mucosa and cervix. Vagina ruggae appear very healthy, adequate lubrication noted.  White discharge noted, testing sample obtained.  Tender to palpation of introitus, and diffusely tender throughout on vaginal palpation. Vaginal palpation also caused moderate suprapubic tenderness. No palpable masses.      Assessment and Plan:    1. Pelvic pain 2. Dyspareunia in female Unable to pinpoint etiology of her pain as pain is so diffuse. Will evaluate for infection/inflammaiton and check pelvic ultrasound.  Recommended water based lubrication trial for vaginal dryness, this was help reduce friction and may help with the overall pain, but likely not the deep pain. If evaluation is unremarkable, will consider further therapy with Neurontin (maybe nerve pain) or other management.  Will follow up all results and manage accordingly. - US PELVIC COMPLETE WITH TRANSVAGINAL; Future - Cervicovaginal ancillary only( [place])  Routine preventative health maintenance measures emphasized. Please refer to After Visit Summary for other counseling recommendations.   Return for any gynecologic concerns.    Total face-to-face time with patient: 20 minutes.  Over 50% of encounter was spent on counseling and coordination of care.   [name], MD, [place] for [employer], [place]

## 2019-10-15 ENCOUNTER — Telehealth (INDEPENDENT_AMBULATORY_CARE_PROVIDER_SITE_OTHER): Payer: Self-pay | Admitting: Lactation Services

## 2019-10-15 DIAGNOSIS — B3731 Acute candidiasis of vulva and vagina: Secondary | ICD-10-CM

## 2019-10-15 DIAGNOSIS — B373 Candidiasis of vulva and vagina: Secondary | ICD-10-CM

## 2019-10-15 LAB — CERVICOVAGINAL ANCILLARY ONLY
Bacterial Vaginitis (gardnerella): NEGATIVE
Candida Glabrata: NEGATIVE
Candida Vaginitis: POSITIVE — AB
Chlamydia: NEGATIVE
Comment: NEGATIVE
Comment: NEGATIVE
Comment: NEGATIVE
Comment: NEGATIVE
Comment: NEGATIVE
Comment: NORMAL
Neisseria Gonorrhea: NEGATIVE
Trichomonas: NEGATIVE

## 2019-10-15 MED ORDER — FLUCONAZOLE 150 MG PO TABS: 150.0000 mg | ORAL_TABLET | Freq: Once | ORAL | 3 refills | Status: AC

## 2019-10-15 NOTE — Addendum Note (Signed)
Addended by: [name] A on: 10/15/2019 12:31 PM   Modules accepted: Orders

## 2019-10-15 NOTE — Telephone Encounter (Signed)
Called patient with assistance of [employer] [place], Constance Holster # [phone].   Informed patient that she has a yeast infection in her vagina. Informed her that prescription has been sent on for her pharmacy. Pt voiced understanding.   Patient reports she has no questions or concerns at this time.

## 2019-10-15 NOTE — Telephone Encounter (Signed)
-----   Message from Osborne Oman, MD sent at 10/15/2019 12:31 PM EST ----- Vaginal discharge test is abnormal and showed yeast infection. Diflucan was prescribed. Please inform patient of results and advise to pick up prescription.  Spanish speaking.

## 2019-10-21 ENCOUNTER — Other Ambulatory Visit: Payer: Self-pay

## 2019-10-21 ENCOUNTER — Ambulatory Visit (HOSPITAL_COMMUNITY)
Admission: RE | Admit: 2019-10-21 | Discharge: 2019-10-21 | Disposition: A | Discharge status: Home / Self Care | Payer: Self-pay | Source: Ambulatory Visit | Attending: Obstetrics & Gynecology | Admitting: Obstetrics & Gynecology

## 2019-10-21 DIAGNOSIS — R102 Pelvic and perineal pain: Secondary | ICD-10-CM | POA: Insufficient documentation

## 2019-10-21 DIAGNOSIS — N941 Unspecified dyspareunia: Secondary | ICD-10-CM | POA: Insufficient documentation

## 2019-10-23 ENCOUNTER — Telehealth (INDEPENDENT_AMBULATORY_CARE_PROVIDER_SITE_OTHER): Payer: Self-pay | Admitting: Lactation Services

## 2019-10-23 DIAGNOSIS — R102 Pelvic and perineal pain: Secondary | ICD-10-CM

## 2019-10-23 NOTE — Addendum Note (Signed)
Addended by: [name] A on: 10/23/2019 09:03 AM   Modules accepted: Orders

## 2019-10-23 NOTE — Telephone Encounter (Signed)
Called patient with assistance of [facility] # [id number].   Informed patient of findings from [place] per Dr. Harolyn Rutherford. Informed patient that Dr. Harolyn Rutherford would like for patient to come in for a lab draw for CA-125. Patient reports she can come in 2/22 @ 2 pm. Message to front desk to put on schedule.   Discussed with patient that Dr. Harolyn Rutherford would like to get results of lab and then can follow up with her if surgical intervention is wanted. Patient reports she would like to pursue meeting with Dr. Harolyn Rutherford due to the pain that she is experiencing. Message to front desk to call and schedule follow up .   Patient reports she has no further questions or concerns at this time.

## 2019-10-23 NOTE — Telephone Encounter (Signed)
-----   Message from Osborne Oman, MD sent at 10/23/2019  9:03 AM EST ----- Patient has a 4 cm complex left ovarian cyst thought to be a hemorrhagic cyst and a 2.4 cm left hydrosalpinx (dilated fallopian tube, not concerning).  Small  2.5 cm right ovarian simple cyst.   Patient should come in for CA-125 check for the complex cyst.   If normal, no further urgent intervention needed unless pain is very concerning to the level that patient is considering surgical intervention.  If patient desires surgery, appointment should be made for her to come in to discuss this with me (after her CA-125 draw). Future lab order placed.  Please call to inform patient of results and recommendations. Patient is Spanish speaking.  [name], MD

## 2019-10-23 NOTE — Progress Notes (Signed)
Patient has a 4 cm complex left ovarian cyst thought to be a hemorrhagic cyst and a 2.4 cm left hydrosalpinx (dilated fallopian tube, not concerning).  Small  2.5 cm right ovarian simple cyst.   Patient should come in for CA-125 check for the complex cyst.   If normal, no further urgent intervention needed unless pain is very concerning to the level that patient is considering surgical intervention.  If patient desires surgery, appointment should be made for her to come in to discuss this with me (after her CA-125 draw). Future lab order placed.  Please call to inform patient of results and recommendations. Patient is Spanish speaking.  [name], MD

## 2019-10-26 ENCOUNTER — Other Ambulatory Visit: Payer: Self-pay

## 2019-10-26 ENCOUNTER — Telehealth: Payer: Self-pay | Admitting: Family Medicine

## 2019-10-26 DIAGNOSIS — N83292 Other ovarian cyst, left side: Secondary | ICD-10-CM

## 2019-10-26 NOTE — Telephone Encounter (Signed)
Called the patient with the in office interpreter Raquel to inform of the upcoming appointment. The patient verbalized understanding.

## 2019-10-27 ENCOUNTER — Telehealth (INDEPENDENT_AMBULATORY_CARE_PROVIDER_SITE_OTHER): Payer: Self-pay | Admitting: Lactation Services

## 2019-10-27 DIAGNOSIS — R1032 Left lower quadrant pain: Secondary | ICD-10-CM

## 2019-10-27 LAB — CA 125: Cancer Antigen (CA) 125: 19.5 U/mL (ref 0.0–38.1)

## 2019-10-27 NOTE — Telephone Encounter (Signed)
-----   Message from Osborne Oman, MD sent at 10/27/2019  9:31 AM EST ----- CA-125 level is normal.  Please call to inform patient of results. Spanish speaking.  Also she has two appointments with me for same reason, can you cancel the one in March? Thank you

## 2019-10-27 NOTE — Telephone Encounter (Signed)
Called and spoke with patient with assistance of Raquel, Administrator, sports. Patient was informed her Ca-125 was normal and she will follow up with Dr. Harolyn Rutherford tomorrow. Patient voiced understanding.

## 2019-10-28 ENCOUNTER — Ambulatory Visit: Payer: Self-pay | Admitting: Obstetrics & Gynecology

## 2019-10-28 ENCOUNTER — Ambulatory Visit (INDEPENDENT_AMBULATORY_CARE_PROVIDER_SITE_OTHER): Payer: Self-pay | Admitting: Obstetrics & Gynecology

## 2019-10-28 ENCOUNTER — Other Ambulatory Visit: Payer: Self-pay

## 2019-10-28 ENCOUNTER — Encounter: Payer: Self-pay | Admitting: Obstetrics & Gynecology

## 2019-10-28 VITALS — BP 144/86 | HR 91 | Wt 169.8 lb

## 2019-10-28 DIAGNOSIS — N7011 Chronic salpingitis: Secondary | ICD-10-CM

## 2019-10-28 DIAGNOSIS — R102 Pelvic and perineal pain: Secondary | ICD-10-CM

## 2019-10-28 DIAGNOSIS — N83202 Unspecified ovarian cyst, left side: Secondary | ICD-10-CM

## 2019-10-28 DIAGNOSIS — K660 Peritoneal adhesions (postprocedural) (postinfection): Secondary | ICD-10-CM

## 2019-10-28 DIAGNOSIS — Z603 Acculturation difficulty: Secondary | ICD-10-CM

## 2019-10-28 DIAGNOSIS — N83201 Unspecified ovarian cyst, right side: Secondary | ICD-10-CM

## 2019-10-28 MED ORDER — TRAMADOL HCL 50 MG PO TABS: 50.0000 mg | ORAL_TABLET | Freq: Four times a day (QID) | ORAL | 4 refills | Status: DC | PRN

## 2019-10-28 NOTE — H&P (View-Only) (Signed)
GYNECOLOGY OFFICE SURGICAL CONSULT NOTE  History:   [name] is a 49 y.o. [phone] here today to discuss surgical management of recently diagnosed bilateral ovarian cysts and hydrosalpinx.    She had normal CA-125 on 10/26/19.  Long history of chronic pelvic pain and recurrent ovarian cysts, also history of TAH for benign indications.  Also has history of abdominal/pelvic and intestinal adhesions upon review of her surgical history. She reports pain is not controlled on Tylenol; takes 1500 mg with no relief. She denies any abnormal vaginal discharge, bleeding or other concerns.  Of note, patient is Spanish-speaking only, interpreter present for this encounter.    Past Medical History:  Diagnosis Date  . ABORTION, SPONTANEOUS 08/18/2007  . Acquired hypothyroidism   . Anemia   . Asthma   . Complication of anesthesia   . CVA (cerebral vascular accident) ([place]) 2008   Left globus pallidus, no residual  . Ectopic pregnancy 04/2006   treated with medication  . GERD (gastroesophageal reflux disease)   . Hepatic steatosis 07/2006, 04/2014   on Abd CT done for pain  . Hypertension   . Hyperthyroidism 10/2000   RAI treatment,  multinodular goiter  . IMPAIRED GLUCOSE TOLERANCE 08/18/2007  . Migraine    "had them often; many years" (03/13/2016)  . Ovarian cyst 04/2014  . PONV (postoperative nausea and vomiting)   . Preterm delivery 12/2010   x3, placental abruption, maternal htn, DM  . Type II diabetes mellitus ([place]) dx'd 12/2015    Past Surgical History:  Procedure Laterality Date  . ABDOMINAL HYSTERECTOMY  04/24/2012   Procedure: HYSTERECTOMY ABDOMINAL;  Surgeon: Terrance Mass, MD;  Location: [facility];  Service: Gynecology;;  . [place]  . CESAREAN SECTION  2012  . CHOLECYSTECTOMY  1997  . COLONOSCOPY  10/2014   1 hyperplastic polyp, rpt 10 yrs Henrene Pastor)  . COLONOSCOPY  02/2019   anal fissure, polyps (TA, HP), rpt 7 yrs ([facility] @ [facility])  .  ESOPHAGOGASTRODUODENOSCOPY  10/2014   WNL  . EXCISION OF ENDOMETRIOMA N/A 08/20/2016   Procedure: EXCISION OF ENDOMETRIOMA LEFT LOWER QUADRANT;  Surgeon: Georganna Skeans, MD;  Location: [facility];  Service: General;  Laterality: N/A;  . FLEXIBLE SIGMOIDOSCOPY  03/2019   s/p botox injection for anal fissure ([facility] @ [facility])  . LAPAROSCOPIC HYSTERECTOMY  04/24/2012   Procedure: HYSTERECTOMY TOTAL LAPAROSCOPIC;  Surgeon: Terrance Mass, MD;  Location: [facility];  Service: Gynecology;  Laterality: N/A;  2 1/2 hours OR time.  Dr. Phineas Real to assisting   . LAPAROSCOPIC LYSIS INTESTINAL ADHESIONS  2013  . OVARIAN CYST REMOVAL  2012  . THYROID SURGERY  ~ 2000   goiter removed    The following portions of the patient's history were reviewed and updated as appropriate: allergies, current medications, past family history, past medical history, past social history, past surgical history and problem list.   Health Maintenance:  Normal mammogram on 07/23/2019.   Review of Systems:  Pertinent items noted in HPI and remainder of comprehensive ROS otherwise negative.  Physical Exam:  BP (!) 144/86   Pulse 91   Wt 169 lb 12.8 oz (77 kg)   LMP 11/12/2011   BMI 31.06 kg/m  CONSTITUTIONAL: Well-developed, well-nourished female in no acute distress.  HEENT:  Normocephalic, atraumatic. External right and left ear normal. No scleral icterus.  NECK: Normal range of motion, supple, no masses noted on observation SKIN: No rash noted. Not diaphoretic. No erythema. No  pallor. MUSCULOSKELETAL: Normal range of motion. No edema noted. NEUROLOGIC: Alert and oriented to person, place, and time. Normal muscle tone coordination. No cranial nerve deficit noted. PSYCHIATRIC: Normal mood and affect. Normal behavior. Normal judgment and thought content. CARDIOVASCULAR: Normal heart rate noted RESPIRATORY: Effort and breath sounds normal, no problems with respiration noted ABDOMEN: No masses noted. No  other overt distention noted. Diffuse mild TTP, and moderate TTP in lower abdomen.  PELVIC: Deferred  Labs and Imaging Results for orders placed or performed in visit on 10/26/19 (from the past 168 hour(s))  CA 125   Collection Time: 10/26/19  2:23 PM  Result Value Ref Range   Cancer Antigen (CA) 125 19.5 0.0 - 38.1 U/mL   US PELVIC COMPLETE WITH TRANSVAGINAL  Result Date: 10/21/2019 CLINICAL DATA:  Pelvic pain, post hysterectomy EXAM: TRANSABDOMINAL AND TRANSVAGINAL ULTRASOUND OF PELVIS TECHNIQUE: Both transabdominal and transvaginal ultrasound examinations of the pelvis were performed. Transabdominal technique was performed for global imaging of the pelvis including uterus, ovaries, adnexal regions, and pelvic cul-de-sac. It was necessary to proceed with endovaginal exam following the transabdominal exam to visualize the ovaries and adnexa. COMPARISON:  05/08/2016 FINDINGS: Uterus Measurements: Prior hysterectomy. Endometrium Thickness: Prior hysterectomy. Right ovary Measurements: 2.9 x 2.2 x 2.0 cm = volume: 6.9 mL. 2.5 cm simple appearing cyst or follicle. Left ovary Measurements: 4.7 x 4.2 x 3.6 cm = volume: 36.9 mL. 4.0 x 3.7 x 3.5 cm complex cystic mass with internal septations and echoes. Medial to the left ovary is a 2.4 x 2.3 x 2.1 cm cystic area, question hydrosalpinx. Other findings No abnormal free fluid. IMPRESSION: Bilateral ovarian cysts, the largest on the left which is complex, likely hemorrhagic cyst. These could be followed with repeat ultrasound in 3-6 months to ensure resolution. Cystic area medial to the left ovary could reflect hydrosalpinx. Prior hysterectomy. Electronically Signed   By: Rolm Baptise M.D.   On: 10/21/2019 11:22      Assessment and Plan:      1. Female pelvic pain 2. Bilateral ovarian cysts 3. Hydrosalpinx 4. Intra-abdominal adhesions Patient was cautioned about taking too much Tylenol, advised to stick to 1000 mg po q6h prn pain, limit of eight tablets a  day.  If pain is more severe, Tramadol prescribed. Advised this was a narcotic medication, concerned about risk of dependence and judicious usage advised. - traMADol (ULTRAM) 50 MG tablet; Take 1-2 tablets (50-100 mg total) by mouth every 6 (six) hours as needed for severe pain.  Dispense: 60 tablet; Refill: 4  Patient desires surgical removal of both tubes and ovaries.  Discussed her surgical history with her, emphasized concern about her extensive adhesions. Her pain is lateral especially on her right, concerned about intestinal adhesions contributing to this pain.  Patient desires surgical management with Diagnostic laparoscopy, possible bilateral salpingoophorectomy for chronic pelvic pain, bilateral ovarian cysts and hydrosalpinx, pelvic adhesive disease.   The risks of surgery were discussed in detail with the patient including but not limited to: bleeding which may require transfusion or reoperation; infection which may require prolonged hospitalization or re-hospitalization and antibiotic therapy; injury to bowel, bladder, ureters and major vessels or other surrounding organs; need for additional procedures including laparotomy; thromboembolic phenomenon, incisional problems and other postoperative or anesthesia complications. Emphasized increased risk of intestinal perforation and injury needing laparotomy, resection of bowel needing ostomy etc.  I did tell patient that this will start as a diagnostic procedure; if the adnexa are not free and have no concerning  intestinal entanglements, the bilateral salpingoophorectomy will proceed. If there are a lot of bowel adhesions, the procedure will be halted given increased risk of bowel injury and associated sequalae.  Patient verbalized understanding of this plan.  If BSO will occur, patient was told that she will be menopausal and discussed all the vasomotor symptoms and risks of cardiac disease, osteoporosis etc that will be associated with earlier  menopause. Also cautioned her that removal of these structures may not alleviate her pain; she may continue to have pain after surgery which will suggest a non-gynecologic etiology. She verbalized understanding of this, and wants to proceed.   All questions were answered.  She was told that she will be contacted by our surgical scheduler regarding the time and date of her surgery; routine preoperative instructions will be given to her by the preoperative nursing team.   She is aware of need for preoperative COVID testing and subsequent quarantine from time of test to time of surgery; she will be given further preoperative instructions at that [place] screening visit. Printed patient education handouts about the procedure were given to the patient to review at home.  Please refer to After Visit Summary for other counseling recommendations.   Return for any gynecologic concerns.    Total face-to-face time with patient: 30 minutes.  Over 50% of encounter was spent on counseling and coordination of care.   [name], MD, [place] for [employer], [place]

## 2019-10-28 NOTE — Patient Instructions (Signed)
Laparoscopa de diagnstico Diagnostic Laparoscopy La laparoscopa de diagnstico es un procedimiento que se realiza para diagnosticar enfermedades en el abdomen. Podra realizarse por diversos motivos, como buscar tejido cicatricial, cncer o la razn de un dolor en el abdomen (abdominal). Durante el procedimiento, se inserta un tubo delgado y flexible que tiene una luz y una cmara en el extremo (laparoscopio) a Lawerance Cruel de una incisin en el abdomen. La imagen de la cmara se [address] en un monitor para ayudar al cirujano a ver dentro del cuerpo. Informe al mdico acerca de lo siguiente:  Cualquier alergia que tenga.  Todos los [employer], incluidos vitaminas, hierbas, gotas oftlmicas, cremas y medicamentos de venta libre.  Cualquier problema que usted o sus familiares hayan tenido con anestsicos.  Cualquier enfermedad de la sangre que tenga.  Cirugas a las que se someti.  Cualquier afeccin mdica que tenga. Cules son los riesgos? En general, se trata de un procedimiento seguro. Sin embargo, pueden ocurrir complicaciones, por ejemplo:  Infeccin.  Hemorragia.  Reacciones alrgicas a los medicamentos o a los tintes.  Lesiones en estructuras u rganos abdominales, como los intestinos, el hgado, el estmago o el bazo. Qu ocurre antes del procedimiento? Medicamentos  Consulte al mdico sobre: ? Quarry manager o suspender los medicamentos que toma habitualmente. Esto es muy importante si toma medicamentos para la diabetes o anticoagulantes. ? Tomar medicamentos como aspirina e ibuprofeno. Estos medicamentos pueden tener un efecto anticoagulante en la [place]. No tome estos medicamentos a menos que el mdico se lo indique. ? Tomar medicamentos de [employer], vitaminas, hierbas y suplementos.  Pueden indicarle un antibitico para ayudar a prevenir infecciones. Mantenerse hidratado Siga las indicaciones del mdico acerca de mantenerse hidratado, las cuales pueden incluir  lo siguiente:  Hasta 2horas antes del procedimiento, puede beber lquidos transparentes, como agua, jugos de fruta sin pulpa, caf negro y t solo. Restricciones en las comidas y bebidas Siga las indicaciones del mdico respecto de las restricciones de comidas o bebidas, las cuales pueden incluir lo siguiente:  Ocho horas antes del procedimiento, deje de ingerir comidas o alimentos difciles de digerir, como carne, alimentos fritos o alimentos con alto contenido en grasas.  Seis horas antes del procedimiento, deje de ingerir comidas o alimentos fciles de digerir, como tostadas o cereales.  Seis horas antes del procedimiento, deje de beber [place] o bebidas que [employer].  Dos horas antes del procedimiento, deje de beber lquidos transparentes. Instrucciones generales  Pregntele al mdico cmo se marcar o se Museum/gallery curator de la ciruga.  Es posible que le indiquen que se bae con un jabn antisptico.  Haga que alguien lo lleve a su casa desde el hospital o la clnica.  Pdale a un adulto responsable que lo cuide durante al menos 24horas despus de que le den el alta del hospital o de la [place]. Esto es importante. Qu ocurre durante el procedimiento?   Para reducir el riesgo de infecciones: ? El equipo mdico se lavar o se desinfectar las manos. ? Pueden rasurarle la zona [place]. ? Le lavarn la piel con jabn.  Le colocarn una va intravenosa en una de las venas.  Le administrarn un medicamento para hacerlo dormir (anestesia general). Tambin le administrarn un medicamento para ayudarlo a relajarse (sedante).  Le colocarn un tubo que pasar por la garganta para que pueda respirar durante el procedimiento.  El abdomen se llena de un gas similar al aire para expandirlo. Esto permitir que el cirujano tenga ms espacio para operar  y Museum/gallery exhibitions officer visualizacin de los rganos.  Le practicarn varias incisiones pequeas en el abdomen.  A travs de las  incisiones, se introducirn un laparoscopio y otros instrumentos quirrgicos en el abdomen.  Se puede tomar [place] de tejido de un rgano para su anlisis (biopsia). Esto depender del motivo por el cual se le est realizando este procedimiento.  Se retirarn del abdomen el laparoscopio y los dems instrumentos.  Se extraer el gas.  Las incisiones se cerrarn con puntos (suturas) y se cubrirn con una venda (vendaje).  Le quitarn el tubo de respiracin. Este procedimiento puede variar segn el mdico y el hospital. Sander Nephew ocurre despus del procedimiento?   Le controlarn la presin arterial, la frecuencia cardaca, la frecuencia respiratoria y Retail buyer de oxgeno en la sangre hasta que desaparezca el efecto de los medicamentos administrados.  No conduzca durante 24horas si le administraron un sedante durante el procedimiento.  Es su responsabilidad retirar [employer] del procedimiento. Pregntele al mdico o a alguien del departamento donde se realice el procedimiento cundo estarn [employer]. Resumen  La laparoscopa de diagnstico es una manera de detectar problemas dentro del abdomen mediante pequeas incisiones.  Siga las indicaciones del mdico acerca de cmo prepararse para este procedimiento.  Pdale a un adulto responsable que lo cuide durante al menos 24horas despus de que le den el alta del hospital o de la [place]. Esto es importante. Esta informacin no tiene Marine scientist el consejo del mdico. Asegrese de hacerle al mdico cualquier pregunta que tenga. Document Revised: 11/29/2017 Document Reviewed: 04/25/2017 Elsevier Patient Education  [place].

## 2019-10-28 NOTE — Progress Notes (Signed)
GYNECOLOGY OFFICE SURGICAL CONSULT NOTE  History:   [name] is a 49 y.o. [phone] here today to discuss surgical management of recently diagnosed bilateral ovarian cysts and hydrosalpinx.    She had normal CA-125 on 10/26/19.  Long history of chronic pelvic pain and recurrent ovarian cysts, also history of TAH for benign indications.  Also has history of abdominal/pelvic and intestinal adhesions upon review of her surgical history. She reports pain is not controlled on Tylenol; takes 1500 mg with no relief. She denies any abnormal vaginal discharge, bleeding or other concerns.  Of note, patient is Spanish-speaking only, interpreter present for this encounter.    Past Medical History:  Diagnosis Date  . ABORTION, SPONTANEOUS 08/18/2007  . Acquired hypothyroidism   . Anemia   . Asthma   . Complication of anesthesia   . 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 Ectopic pregnancy 04/2006   treated with medication  . GERD (gastroesophageal reflux disease)   . Hepatic steatosis 07/2006, 04/2014   on Abd CT done for pain  . Hypertension   . Hyperthyroidism 10/2000   RAI treatment,  multinodular goiter  . IMPAIRED GLUCOSE TOLERANCE 08/18/2007  . Migraine    "had them often; many years" (03/13/2016)  . Ovarian cyst 04/2014  . PONV (postoperative nausea and vomiting)   . Preterm delivery 12/2010   x3, placental abruption, maternal htn, DM  . Type II diabetes mellitus ([place]) dx'd 12/2015    Past Surgical History:  Procedure Laterality Date  . ABDOMINAL HYSTERECTOMY  04/24/2012   Procedure: HYSTERECTOMY ABDOMINAL;  Surgeon: Terrance Mass, MD;  Location: [facility];  Service: Gynecology;;  . [place]  . CESAREAN SECTION  2012  . CHOLECYSTECTOMY  1997  . COLONOSCOPY  10/2014   1 hyperplastic polyp, rpt 10 yrs Henrene Pastor)  . COLONOSCOPY  02/2019   anal fissure, polyps (TA, HP), rpt 7 yrs ([facility] @ [facility])  .  ESOPHAGOGASTRODUODENOSCOPY  10/2014   WNL  . EXCISION OF ENDOMETRIOMA N/A 08/20/2016   Procedure: EXCISION OF ENDOMETRIOMA LEFT LOWER QUADRANT;  Surgeon: Georganna Skeans, MD;  Location: [facility];  Service: General;  Laterality: N/A;  . FLEXIBLE SIGMOIDOSCOPY  03/2019   s/p botox injection for anal fissure ([facility] @ [facility])  . LAPAROSCOPIC HYSTERECTOMY  04/24/2012   Procedure: HYSTERECTOMY TOTAL LAPAROSCOPIC;  Surgeon: Terrance Mass, MD;  Location: [facility];  Service: Gynecology;  Laterality: N/A;  2 1/2 hours OR time.  Dr. Phineas Real to assisting   . LAPAROSCOPIC LYSIS INTESTINAL ADHESIONS  2013  . OVARIAN CYST REMOVAL  2012  . THYROID SURGERY  ~ 2000   goiter removed    The following portions of the patient's history were reviewed and updated as appropriate: allergies, current medications, past family history, past medical history, past social history, past surgical history and problem list.   Health Maintenance:  Normal mammogram on 07/23/2019.   Review of Systems:  Pertinent items noted in HPI and remainder of comprehensive ROS otherwise negative.  Physical Exam:  BP (!) 144/86   Pulse 91   Wt 169 lb 12.8 oz (77 kg)   LMP 11/12/2011   BMI 31.06 kg/m  CONSTITUTIONAL: Well-developed, well-nourished female in no acute distress.  HEENT:  Normocephalic, atraumatic. External right and left ear normal. No scleral icterus.  NECK: Normal range of motion, supple, no masses noted on observation SKIN: No rash noted. Not diaphoretic. No erythema. No  pallor. MUSCULOSKELETAL: Normal range of motion. No edema noted. NEUROLOGIC: Alert and oriented to person, place, and time. Normal muscle tone coordination. No cranial nerve deficit noted. PSYCHIATRIC: Normal mood and affect. Normal behavior. Normal judgment and thought content. CARDIOVASCULAR: Normal heart rate noted RESPIRATORY: Effort and breath sounds normal, no problems with respiration noted ABDOMEN: No masses noted. No  other overt distention noted. Diffuse mild TTP, and moderate TTP in lower abdomen.  PELVIC: Deferred  Labs and Imaging Results for orders placed or performed in visit on 10/26/19 (from the past 168 hour(s))  CA 125   Collection Time: 10/26/19  2:23 PM  Result Value Ref Range   Cancer Antigen (CA) 125 19.5 0.0 - 38.1 U/mL   US PELVIC COMPLETE WITH TRANSVAGINAL  Result Date: 10/21/2019 CLINICAL DATA:  Pelvic pain, post hysterectomy EXAM: TRANSABDOMINAL AND TRANSVAGINAL ULTRASOUND OF PELVIS TECHNIQUE: Both transabdominal and transvaginal ultrasound examinations of the pelvis were performed. Transabdominal technique was performed for global imaging of the pelvis including uterus, ovaries, adnexal regions, and pelvic cul-de-sac. It was necessary to proceed with endovaginal exam following the transabdominal exam to visualize the ovaries and adnexa. COMPARISON:  05/08/2016 FINDINGS: Uterus Measurements: Prior hysterectomy. Endometrium Thickness: Prior hysterectomy. Right ovary Measurements: 2.9 x 2.2 x 2.0 cm = volume: 6.9 mL. 2.5 cm simple appearing cyst or follicle. Left ovary Measurements: 4.7 x 4.2 x 3.6 cm = volume: 36.9 mL. 4.0 x 3.7 x 3.5 cm complex cystic mass with internal septations and echoes. Medial to the left ovary is a 2.4 x 2.3 x 2.1 cm cystic area, question hydrosalpinx. Other findings No abnormal free fluid. IMPRESSION: Bilateral ovarian cysts, the largest on the left which is complex, likely hemorrhagic cyst. These could be followed with repeat ultrasound in 3-6 months to ensure resolution. Cystic area medial to the left ovary could reflect hydrosalpinx. Prior hysterectomy. Electronically Signed   By: Rolm Baptise M.D.   On: 10/21/2019 11:22      Assessment and Plan:      1. Female pelvic pain 2. Bilateral ovarian cysts 3. Hydrosalpinx 4. Intra-abdominal adhesions Patient was cautioned about taking too much Tylenol, advised to stick to 1000 mg po q6h prn pain, limit of eight tablets a  day.  If pain is more severe, Tramadol prescribed. Advised this was a narcotic medication, concerned about risk of dependence and judicious usage advised. - traMADol (ULTRAM) 50 MG tablet; Take 1-2 tablets (50-100 mg total) by mouth every 6 (six) hours as needed for severe pain.  Dispense: 60 tablet; Refill: 4  Patient desires surgical removal of both tubes and ovaries.  Discussed her surgical history with her, emphasized concern about her extensive adhesions. Her pain is lateral especially on her right, concerned about intestinal adhesions contributing to this pain.  Patient desires surgical management with Diagnostic laparoscopy, possible bilateral salpingoophorectomy for chronic pelvic pain, bilateral ovarian cysts and hydrosalpinx, pelvic adhesive disease.   The risks of surgery were discussed in detail with the patient including but not limited to: bleeding which may require transfusion or reoperation; infection which may require prolonged hospitalization or re-hospitalization and antibiotic therapy; injury to bowel, bladder, ureters and major vessels or other surrounding organs; need for additional procedures including laparotomy; thromboembolic phenomenon, incisional problems and other postoperative or anesthesia complications. Emphasized increased risk of intestinal perforation and injury needing laparotomy, resection of bowel needing ostomy etc.  I did tell patient that this will start as a diagnostic procedure; if the adnexa are not free and have no concerning  intestinal entanglements, the bilateral salpingoophorectomy will proceed. If there are a lot of bowel adhesions, the procedure will be halted given increased risk of bowel injury and associated sequalae.  Patient verbalized understanding of this plan.  If BSO will occur, patient was told that she will be menopausal and discussed all the vasomotor symptoms and risks of cardiac disease, osteoporosis etc that will be associated with earlier  menopause. Also cautioned her that removal of these structures may not alleviate her pain; she may continue to have pain after surgery which will suggest a non-gynecologic etiology. She verbalized understanding of this, and wants to proceed.   All questions were answered.  She was told that she will be contacted by our surgical scheduler regarding the time and date of her surgery; routine preoperative instructions will be given to her by the preoperative nursing team.   She is aware of need for preoperative COVID testing and subsequent quarantine from time of test to time of surgery; she will be given further preoperative instructions at that [place] screening visit. Printed patient education handouts about the procedure were given to the patient to review at home.  Please refer to After Visit Summary for other counseling recommendations.   Return for any gynecologic concerns.    Total face-to-face time with patient: 30 minutes.  Over 50% of encounter was spent on counseling and coordination of care.   [name], MD, [place] for [employer], [place]

## 2019-10-29 ENCOUNTER — Telehealth: Payer: Self-pay

## 2019-10-29 NOTE — Telephone Encounter (Signed)
Mr Tienda calling that pt wanted cb from Dr Danise Mina to talk about a medical question. I spoke with Mr. Herlong and he said pt was [facility], Dr Harolyn Rutherford on 10/28/19 and pt has possible surgical procedure and Dr Harolyn Rutherford is to send info to Dr Darnell Level and pt wants to talk to Dr Darnell Level about surgery. Mr Lucci said a cb on 10/30/19 from Dr Darnell Level would be OK. I advised I would send the note but could not give definite time for cb. Mr Uhle voiced understanding and appreciative.

## 2019-10-30 ENCOUNTER — Telehealth: Payer: Self-pay | Admitting: General Practice

## 2019-10-30 NOTE — Telephone Encounter (Signed)
Spoke with patient.

## 2019-10-30 NOTE — Telephone Encounter (Signed)
Patient called into front office requesting tramadol Rx be sent to [facility]. Called [facility] & cancelled prescription. [facility] and provided verbal order. Patient was informed by front office team member.

## 2019-11-05 ENCOUNTER — Encounter (HOSPITAL_BASED_OUTPATIENT_CLINIC_OR_DEPARTMENT_OTHER): Payer: Self-pay | Admitting: Obstetrics & Gynecology

## 2019-11-06 ENCOUNTER — Other Ambulatory Visit: Payer: Self-pay

## 2019-11-06 ENCOUNTER — Encounter (HOSPITAL_BASED_OUTPATIENT_CLINIC_OR_DEPARTMENT_OTHER): Payer: Self-pay | Admitting: Obstetrics & Gynecology

## 2019-11-06 NOTE — Progress Notes (Addendum)
ADDENDUM:  Chart reviewed by anesthesia, Konrad Felix PA, ok to proceed.   Spoke w/ via phone for pre-op interview--- Pt thru [place] interpreter [phone] Lab needs dos---- CBC,BMP,Urine preg, EKG  Lab results------ no COVID test ------ 11-07-2019 @ 0940 Arrive at ------- 0630 NPO after ------ [place] Medications to take morning of surgery ----- Synthroid w/ sips of water and do not take bp medication morning of surgery Diabetic medication ----- do not take metformin morning of surgery Patient Special Instructions ----- advised to call dr anyanwu office for advise if to stop ASA or not prior to surgery Pre-Op special Istructions ----- requested spanish interpreter via email to  interpreting (copy email with chart) Patient verbalized understanding of instructions that were given at this phone interview. Patient denies shortness of breath, chest pain, fever, cough a this phone interview.   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  Chart to be reviewed by Konrad Felix PA  PCP:  Dr Jenetta Loges  (lov in epic) Cardiologist :  no Chest x-ray : 10-19-2018 epic EKG : 10-19-2018  epic Echo : 12-13-2006  Epic Stress test:  no Cardiac Cath :  no Sleep Study/ CPAP :  NO Fasting Blood Sugar :      / Checks Blood Sugar -- times a day:    Pt does not check blood sugar at home  Blood Thinner/ Instructions Maryjane Hurter Dose:  NO ASA / Instructions/ Last Dose :  ASA 81mg /  Managed by pcp/  Per pt was not given any instructions by dr Harolyn Rutherford to stop asa or not prior to surgery.  Advised to call the dr today for instructions  Patient denies shortness of breath, chest pain, fever, and cough at this phone interview.

## 2019-11-07 ENCOUNTER — Other Ambulatory Visit (HOSPITAL_COMMUNITY)
Admission: RE | Admit: 2019-11-07 | Discharge: 2019-11-07 | Disposition: A | Discharge status: Home / Self Care | Payer: Self-pay | Source: Ambulatory Visit | Attending: Obstetrics & Gynecology | Admitting: Obstetrics & Gynecology

## 2019-11-07 DIAGNOSIS — Z20822 Contact with and (suspected) exposure to covid-19: Secondary | ICD-10-CM | POA: Insufficient documentation

## 2019-11-07 DIAGNOSIS — Z01812 Encounter for preprocedural laboratory examination: Secondary | ICD-10-CM | POA: Insufficient documentation

## 2019-11-07 LAB — SARS CORONAVIRUS 2 (TAT 6-24 HRS): SARS Coronavirus 2: NEGATIVE

## 2019-11-11 ENCOUNTER — Ambulatory Visit (HOSPITAL_BASED_OUTPATIENT_CLINIC_OR_DEPARTMENT_OTHER)
Admission: RE | Admit: 2019-11-11 | Discharge: 2019-11-11 | Disposition: A | Discharge status: Home / Self Care | Payer: Self-pay | Attending: Obstetrics & Gynecology | Admitting: Obstetrics & Gynecology

## 2019-11-11 ENCOUNTER — Encounter (HOSPITAL_BASED_OUTPATIENT_CLINIC_OR_DEPARTMENT_OTHER)
Admission: RE | Disposition: A | Discharge status: Home / Self Care | Payer: Self-pay | Source: Home / Self Care | Attending: Obstetrics & Gynecology

## 2019-11-11 ENCOUNTER — Ambulatory Visit (HOSPITAL_BASED_OUTPATIENT_CLINIC_OR_DEPARTMENT_OTHER): Payer: Self-pay | Admitting: Physician Assistant

## 2019-11-11 ENCOUNTER — Encounter (HOSPITAL_BASED_OUTPATIENT_CLINIC_OR_DEPARTMENT_OTHER): Payer: Self-pay | Admitting: Obstetrics & Gynecology

## 2019-11-11 ENCOUNTER — Other Ambulatory Visit: Payer: Self-pay

## 2019-11-11 DIAGNOSIS — G8929 Other chronic pain: Secondary | ICD-10-CM

## 2019-11-11 DIAGNOSIS — K660 Peritoneal adhesions (postprocedural) (postinfection): Secondary | ICD-10-CM | POA: Insufficient documentation

## 2019-11-11 DIAGNOSIS — N83202 Unspecified ovarian cyst, left side: Secondary | ICD-10-CM | POA: Insufficient documentation

## 2019-11-11 DIAGNOSIS — Z8673 Personal history of transient ischemic attack (TIA), and cerebral infarction without residual deficits: Secondary | ICD-10-CM | POA: Insufficient documentation

## 2019-11-11 DIAGNOSIS — E119 Type 2 diabetes mellitus without complications: Secondary | ICD-10-CM | POA: Insufficient documentation

## 2019-11-11 DIAGNOSIS — R102 Pelvic and perineal pain: Secondary | ICD-10-CM | POA: Insufficient documentation

## 2019-11-11 DIAGNOSIS — Z9889 Other specified postprocedural states: Secondary | ICD-10-CM

## 2019-11-11 DIAGNOSIS — N736 Female pelvic peritoneal adhesions (postinfective): Secondary | ICD-10-CM

## 2019-11-11 DIAGNOSIS — N7011 Chronic salpingitis: Secondary | ICD-10-CM | POA: Insufficient documentation

## 2019-11-11 DIAGNOSIS — N83201 Unspecified ovarian cyst, right side: Secondary | ICD-10-CM | POA: Insufficient documentation

## 2019-11-11 HISTORY — DX: Unspecified ovarian cyst, right side: N83.201

## 2019-11-11 HISTORY — DX: Unspecified hemorrhoids: K64.9

## 2019-11-11 HISTORY — DX: Personal history of other diseases of the digestive system: Z87.19

## 2019-11-11 HISTORY — DX: Personal history of transient ischemic attack (TIA), and cerebral infarction without residual deficits: Z86.73

## 2019-11-11 HISTORY — DX: Polycystic ovarian syndrome: E28.2

## 2019-11-11 HISTORY — DX: Other asthma: J45.998

## 2019-11-11 HISTORY — PX: LAPAROSCOPIC SALPINGO OOPHERECTOMY: SHX5927

## 2019-11-11 HISTORY — DX: Unspecified ovarian cyst, right side: N83.202

## 2019-11-11 HISTORY — DX: Chronic salpingitis: N70.11

## 2019-11-11 HISTORY — DX: Female pelvic peritoneal adhesions (postinfective): N73.6

## 2019-11-11 HISTORY — DX: Postprocedural hypothyroidism: E89.0

## 2019-11-11 HISTORY — DX: Other chronic pain: G89.29

## 2019-11-11 HISTORY — DX: Personal history of other endocrine, nutritional and metabolic disease: Z86.39

## 2019-11-11 HISTORY — DX: Personal history of other complications of pregnancy, childbirth and the puerperium: Z87.59

## 2019-11-11 LAB — CBC
HCT: 39.5 % (ref 36.0–46.0)
Hemoglobin: 13.2 g/dL (ref 12.0–15.0)
MCH: 29.7 pg (ref 26.0–34.0)
MCHC: 33.4 g/dL (ref 30.0–36.0)
MCV: 88.8 fL (ref 80.0–100.0)
Platelets: 139 10*3/uL — ABNORMAL LOW (ref 150–400)
RBC: 4.45 MIL/uL (ref 3.87–5.11)
RDW: 12.7 % (ref 11.5–15.5)
WBC: 7.6 10*3/uL (ref 4.0–10.5)
nRBC: 0 % (ref 0.0–0.2)

## 2019-11-11 LAB — BASIC METABOLIC PANEL
Anion gap: 8 (ref 5–15)
BUN: 18 mg/dL (ref 6–20)
CO2: 23 mmol/L (ref 22–32)
Calcium: 8.8 mg/dL — ABNORMAL LOW (ref 8.9–10.3)
Chloride: 105 mmol/L (ref 98–111)
Creatinine, Ser: 0.66 mg/dL (ref 0.44–1.00)
GFR calc Af Amer: >60 mL/min (ref >60)
GFR calc non Af Amer: >60 mL/min (ref >60)
Glucose, Bld: 129 mg/dL — ABNORMAL HIGH (ref 70–99)
Potassium: 3.3 mmol/L — ABNORMAL LOW (ref 3.5–5.1)
Sodium: 136 mmol/L (ref 135–145)

## 2019-11-11 LAB — GLUCOSE, CAPILLARY: Glucose-Capillary: 129 mg/dL — ABNORMAL HIGH (ref 70–99)

## 2019-11-11 LAB — POCT PREGNANCY, URINE: Preg Test, Ur: NEGATIVE

## 2019-11-11 SURGERY — SALPINGO-OOPHORECTOMY, LAPAROSCOPIC
Anesthesia: General | Site: Abdomen | Laterality: Bilateral

## 2019-11-11 MED ORDER — ONDANSETRON HCL 4 MG/2ML IJ SOLN
INTRAMUSCULAR | Status: AC
  Filled 2019-11-11: qty 2

## 2019-11-11 MED ORDER — DOCUSATE SODIUM 100 MG PO CAPS: 100.0000 mg | ORAL_CAPSULE | Freq: Two times a day (BID) | ORAL | 2 refills | Status: DC | PRN

## 2019-11-11 MED ORDER — LACTATED RINGERS IV SOLN
INTRAVENOUS | Status: DC
  Filled 2019-11-11: qty 1000

## 2019-11-11 MED ORDER — PROPOFOL 10 MG/ML IV BOLUS
INTRAVENOUS | Status: DC | PRN
  Administered 2019-11-11: 200 mg via INTRAVENOUS

## 2019-11-11 MED ORDER — KETOROLAC TROMETHAMINE 30 MG/ML IJ SOLN
INTRAMUSCULAR | Status: DC | PRN
  Administered 2019-11-11: 30 mg via INTRAVENOUS

## 2019-11-11 MED ORDER — MIDAZOLAM HCL 2 MG/2ML IJ SOLN
INTRAMUSCULAR | Status: DC | PRN
  Administered 2019-11-11: 2 mg via INTRAVENOUS

## 2019-11-11 MED ORDER — FENTANYL CITRATE (PF) 100 MCG/2ML IJ SOLN
INTRAMUSCULAR | Status: DC | PRN
  Administered 2019-11-11 (×2): 50 ug via INTRAVENOUS

## 2019-11-11 MED ORDER — FENTANYL CITRATE (PF) 100 MCG/2ML IJ SOLN
INTRAMUSCULAR | Status: AC
  Filled 2019-11-11: qty 2

## 2019-11-11 MED ORDER — OXYCODONE-ACETAMINOPHEN 5-325 MG PO TABS: 1.0000 | ORAL_TABLET | ORAL | 0 refills | Status: DC | PRN

## 2019-11-11 MED ORDER — PROPOFOL 10 MG/ML IV BOLUS
INTRAVENOUS | Status: AC
  Filled 2019-11-11: qty 40

## 2019-11-11 MED ORDER — SCOPOLAMINE 1 MG/3DAYS TD PT72
1.0000 | MEDICATED_PATCH | TRANSDERMAL | Status: DC
  Administered 2019-11-11: 08:00:00 1.5 mg via TRANSDERMAL
  Filled 2019-11-11: qty 1

## 2019-11-11 MED ORDER — SODIUM CHLORIDE 0.9 % IV SOLN
INTRAVENOUS | Status: AC
  Filled 2019-11-11: qty 2

## 2019-11-11 MED ORDER — SCOPOLAMINE 1 MG/3DAYS TD PT72
MEDICATED_PATCH | TRANSDERMAL | Status: AC
  Filled 2019-11-11: qty 1

## 2019-11-11 MED ORDER — SUGAMMADEX SODIUM 200 MG/2ML IV SOLN
INTRAVENOUS | Status: DC | PRN
  Administered 2019-11-11: 350 mg via INTRAVENOUS

## 2019-11-11 MED ORDER — ACETAMINOPHEN 500 MG PO TABS
1000.0000 mg | ORAL_TABLET | Freq: Once | ORAL | Status: AC
  Administered 2019-11-11: 1000 mg via ORAL
  Filled 2019-11-11: qty 2

## 2019-11-11 MED ORDER — ONDANSETRON HCL 4 MG/2ML IJ SOLN
INTRAMUSCULAR | Status: DC | PRN
  Administered 2019-11-11: 4 mg via INTRAVENOUS

## 2019-11-11 MED ORDER — LIDOCAINE 2% (20 MG/ML) 5 ML SYRINGE
INTRAMUSCULAR | Status: DC | PRN
  Administered 2019-11-11: 60 mg via INTRAVENOUS

## 2019-11-11 MED ORDER — MIDAZOLAM HCL 2 MG/2ML IJ SOLN
INTRAMUSCULAR | Status: AC
  Filled 2019-11-11: qty 2

## 2019-11-11 MED ORDER — DEXAMETHASONE SODIUM PHOSPHATE 10 MG/ML IJ SOLN
INTRAMUSCULAR | Status: AC
  Filled 2019-11-11: qty 1

## 2019-11-11 MED ORDER — KETOROLAC TROMETHAMINE 30 MG/ML IJ SOLN
INTRAMUSCULAR | Status: AC
  Filled 2019-11-11: qty 1

## 2019-11-11 MED ORDER — HYDROMORPHONE HCL 1 MG/ML IJ SOLN
0.5000 mg | Freq: Once | INTRAMUSCULAR | Status: AC
  Administered 2019-11-11: 10:00:00 0.5 mg via INTRAVENOUS
  Filled 2019-11-11: qty 0.5

## 2019-11-11 MED ORDER — ACETAMINOPHEN 500 MG PO TABS
ORAL_TABLET | ORAL | Status: AC
  Filled 2019-11-11: qty 2

## 2019-11-11 MED ORDER — SUGAMMADEX SODIUM 200 MG/2ML IV SOLN: INTRAVENOUS | Status: DC | PRN

## 2019-11-11 MED ORDER — HYDROMORPHONE HCL 1 MG/ML IJ SOLN
INTRAMUSCULAR | Status: AC
  Filled 2019-11-11: qty 1

## 2019-11-11 MED ORDER — OXYCODONE HCL 5 MG PO TABS
ORAL_TABLET | ORAL | Status: AC
  Filled 2019-11-11: qty 1

## 2019-11-11 MED ORDER — OXYCODONE HCL 5 MG PO TABS
5.0000 mg | ORAL_TABLET | Freq: Once | ORAL | Status: AC
  Administered 2019-11-11: 12:00:00 5 mg via ORAL
  Filled 2019-11-11: qty 1

## 2019-11-11 MED ORDER — LIDOCAINE 2% (20 MG/ML) 5 ML SYRINGE
INTRAMUSCULAR | Status: AC
  Filled 2019-11-11: qty 5

## 2019-11-11 MED ORDER — BUPIVACAINE HCL (PF) 0.5 % IJ SOLN
INTRAMUSCULAR | Status: DC | PRN
  Administered 2019-11-11: 5 mL

## 2019-11-11 MED ORDER — PROPOFOL 500 MG/50ML IV EMUL
INTRAVENOUS | Status: DC | PRN
  Administered 2019-11-11: 150 ug/kg/min via INTRAVENOUS

## 2019-11-11 MED ORDER — PROPOFOL 500 MG/50ML IV EMUL
INTRAVENOUS | Status: AC
  Filled 2019-11-11: qty 100

## 2019-11-11 MED ORDER — SODIUM CHLORIDE 0.9 % IV SOLN
2.0000 g | INTRAVENOUS | Status: AC
  Administered 2019-11-11: 2 g via INTRAVENOUS
  Filled 2019-11-11: qty 2

## 2019-11-11 MED ORDER — ROCURONIUM BROMIDE 10 MG/ML (PF) SYRINGE
PREFILLED_SYRINGE | INTRAVENOUS | Status: DC | PRN
  Administered 2019-11-11: 20 mg via INTRAVENOUS
  Administered 2019-11-11: 80 mg via INTRAVENOUS

## 2019-11-11 MED ORDER — DEXAMETHASONE SODIUM PHOSPHATE 10 MG/ML IJ SOLN
INTRAMUSCULAR | Status: DC | PRN
  Administered 2019-11-11: 10 mg via INTRAVENOUS

## 2019-11-11 MED ORDER — FENTANYL CITRATE (PF) 100 MCG/2ML IJ SOLN
25.0000 ug | INTRAMUSCULAR | Status: DC | PRN
  Administered 2019-11-11 (×2): 50 ug via INTRAVENOUS
  Filled 2019-11-11: qty 1

## 2019-11-11 SURGICAL SUPPLY — 33 items
ADH SKN CLS APL DERMABOND .7 (GAUZE/BANDAGES/DRESSINGS) ×1
CABLE HIGH FREQUENCY MONO STRZ (ELECTRODE) IMPLANT
CATH ROBINSON RED A/P 16FR (CATHETERS) ×1 IMPLANT
COVER MAYO STAND STRL (DRAPES) ×3 IMPLANT
DERMABOND ADVANCED (GAUZE/BANDAGES/DRESSINGS) ×2
DERMABOND ADVANCED .7 DNX12 (GAUZE/BANDAGES/DRESSINGS) ×1 IMPLANT
DRSG OPSITE POSTOP 3X4 (GAUZE/BANDAGES/DRESSINGS) IMPLANT
DURAPREP 26ML APPLICATOR (WOUND CARE) ×3 IMPLANT
GAUZE 4X4 16PLY RFD (DISPOSABLE) ×3 IMPLANT
GLOVE BIO SURGEON STRL SZ 6.5 (GLOVE) ×2 IMPLANT
GLOVE BIO SURGEONS STRL SZ 6.5 (GLOVE) ×1
GLOVE BIOGEL PI IND STRL 7.0 (GLOVE) ×4 IMPLANT
GLOVE BIOGEL PI INDICATOR 7.0 (GLOVE) ×8
GLOVE ECLIPSE 7.0 STRL STRAW (GLOVE) ×3 IMPLANT
GOWN STRL REUS W/ TWL LRG LVL3 (GOWN DISPOSABLE) ×2 IMPLANT
GOWN STRL REUS W/TWL LRG LVL3 (GOWN DISPOSABLE) ×6
NDL INSUFFLATION 14GA 120MM (NEEDLE) IMPLANT
NEEDLE INSUFFLATION 14GA 120MM (NEEDLE) IMPLANT
NS IRRIG 1000ML POUR BTL (IV SOLUTION) ×1 IMPLANT
PACK LAPAROSCOPY BASIN (CUSTOM PROCEDURE TRAY) ×3 IMPLANT
PACK TRENDGUARD 450 HYBRID PRO (MISCELLANEOUS) IMPLANT
POUCH SPECIMEN RETRIEVAL 10MM (ENDOMECHANICALS) IMPLANT
PROTECTOR NERVE ULNAR (MISCELLANEOUS) ×6 IMPLANT
SET IRRIG TUBING LAPAROSCOPIC (IRRIGATION / IRRIGATOR) IMPLANT
SET TUBE SMOKE EVAC HIGH FLOW (TUBING) ×3 IMPLANT
SHEARS HARMONIC ACE PLUS 36CM (ENDOMECHANICALS) IMPLANT
SUT MNCRL AB 4-0 PS2 18 (SUTURE) ×3 IMPLANT
SUT VICRYL 0 UR6 27IN ABS (SUTURE) IMPLANT
TOWEL OR 17X26 10 PK STRL BLUE (TOWEL DISPOSABLE) ×3 IMPLANT
TRAY FOLEY W/BAG SLVR 14FR (SET/KITS/TRAYS/PACK) IMPLANT
TRENDGUARD 450 HYBRID PRO PACK (MISCELLANEOUS) ×3
TROCAR BLADELESS OPT 5 100 (ENDOMECHANICALS) ×6 IMPLANT
TROCAR XCEL NON-BLD 11X100MML (ENDOMECHANICALS) ×1 IMPLANT

## 2019-11-11 NOTE — Op Note (Signed)
[name] [name] PROCEDURE DATE: 11/11/2019  PREOPERATIVE DIAGNOSIS: Chronic pelvic pain, ovarian cyst and hydrosalpinx POSTOPERATIVE DIAGNOSIS: Extensive abdominopelvic adhesive disease, unable to visualize adnexa PROCEDURE: Diagnostic laparoscopy SURGEON:  Dr. [name] ASSISTANT: Dr. Arlina Robes.  An experienced assistant was required given the standard of surgical care given the complexity of the case.  This assistant was needed for exposure, dissection, suctioning, retraction, instrument exchange, and for overall help during the procedure.  INDICATIONS: 49 y.o. [id number] with history of chronic pelvic pain, multiple surgeries and adhesive disease desiring surgical evaluation and removal of adnexa.   Please see preoperative notes for further details.  The risks of surgery were discussed in detail with the patient including but not limited to: bleeding which may require transfusion or reoperation; infection which may require prolonged hospitalization or re-hospitalization and antibiotic therapy; injury to bowel, bladder, ureters and major vessels or other surrounding organs; need for additional procedures including laparotomy; thromboembolic phenomenon, incisional problems and other postoperative or anesthesia complications. Emphasized increased risk of intestinal perforation and injury needing laparotomy, resection of bowel needing ostomy etc.  I did tell patient that this will start as a diagnostic procedure; if the adnexa are not free and have no concerning intestinal entanglements, the bilateral salpingoophorectomy will not proceed. Emphasized that if there are a lot of bowel adhesions, the procedure will be halted given increased risk of bowel injury and associated sequalae.  Written informed consent was obtained.    FINDINGS:  Extensive adhesions involving bowel, omentum and anterior abdbdominal wall and pelvic side walls.  Unable to visualize upper abdomen/liver due to  adhesions. Unable to visualize adnexa also due to adhesions, bowel noted to be tightly wrapped around part of left adnexa that was visible. Unable to see cul-de-sac. Multiple peritoneal windows, no classic powder-burn endometriosis lesions seen.  If further surgery is desired, patient will need General Surgery or an advanced laparoscopy practitioner or open surgery.   ANESTHESIA:    General ESTIMATED BLOOD LOSS: 5 ml SPECIMENS: None COMPLICATIONS: None immediate  PROCEDURE IN DETAIL:  The patient had sequential compression devices applied to her lower extremities while in the preoperative area.  She was then taken to the operating room where general anesthesia was administered and was found to be adequate.  She was placed in the dorsal lithotomy position, and was prepped and draped in a sterile manner.  A Foley catheter was inserted into her bladder and attached to constant drainage and a uterine manipulator was then advanced into the uterus .  After an adequate timeout was performed, attention was turned to the abdomen where an umbilical incision was made with the scalpel.  The Optiview 5-mm trocar and sleeve were then advanced without difficulty with the laparoscope under direct visualization into the abdomen.  The abdomen was then insufflated with carbon dioxide gas and adequate pneumoperitoneum was obtained. A 5-mm port was placed in the left lower quadrant, and a blunt probe was used to try to aid with visualization of adnexa which was unsuccessful after several attempts.   A detailed survey of the patient's pelvis and abdomen revealed the findings as mentioned above; rather extensive adhesive disease noted.   No intraoperative injury to surrounding organs was noted.  The abdomen was desufflated and all instruments were then removed from the patient's abdomen. The uterine manipulator was removed without complications.  All incisions were closed with a 4-0 Vicryl stitch and Dermabond. The patient  tolerated the procedures well.  All instruments, needles, and sponge counts  were correct x 3. The patient was taken to the recovery room in stable condition.    The patient will be discharged to home as per PACU criteria.  Routine postoperative instructions given.  She was prescribed Percocet and Colace.  She will follow up in the in the office on 12/02/19 for postoperative evaluation.    [name], MD, [place] for [employer], [place]

## 2019-11-11 NOTE — Anesthesia Postprocedure Evaluation (Signed)
Anesthesia Post Note  Patient: [name]  Procedure(s) Performed: Diagnostic LAPAROSCOPY  (Bilateral Abdomen)     Patient location during evaluation: PACU Anesthesia Type: General Level of consciousness: awake and alert Pain management: pain level controlled Vital Signs Assessment: post-procedure vital signs reviewed and stable Respiratory status: spontaneous breathing, nonlabored ventilation, respiratory function stable and patient connected to nasal cannula oxygen Cardiovascular status: blood pressure returned to baseline and stable Postop Assessment: no apparent nausea or vomiting Anesthetic complications: no    Last Vitals:  Vitals:   11/11/19 1050 11/11/19 1100  BP:  116/74  Pulse: 63 69  Resp: 13 13  Temp:    SpO2: 99% 97%    Last Pain:  Vitals:   11/11/19 1130  TempSrc:   PainSc: 4                  Keegen Heffern L Rosemary Pentecost

## 2019-11-11 NOTE — Anesthesia Procedure Notes (Signed)
Procedure Name: Intubation Date/Time: 11/11/2019 8:49 AM Performed by: Mechele Claude, CRNA Pre-anesthesia Checklist: Patient identified, Emergency Drugs available, Suction available and Patient being monitored Patient Re-evaluated:Patient Re-evaluated prior to induction Oxygen Delivery Method: Circle system utilized Preoxygenation: Pre-oxygenation with 100% oxygen Induction Type: IV induction and Cricoid Pressure applied Ventilation: Mask ventilation without difficulty Laryngoscope Size: Mac and 3 Grade View: Grade I Tube type: Oral Tube size: 7.0 mm Number of attempts: 1 Airway Equipment and Method: Stylet Placement Confirmation: ETT inserted through vocal cords under direct vision,  positive ETCO2 and breath sounds checked- equal and bilateral Secured at: 21 cm Tube secured with: Tape Dental Injury: Teeth and Oropharynx as per pre-operative assessment

## 2019-11-11 NOTE — Anesthesia Preprocedure Evaluation (Addendum)
Anesthesia Evaluation  Patient identified by MRN, date of birth, ID band Patient awake    Reviewed: Allergy & Precautions, NPO status , Patient's Chart, lab work & pertinent test results  History of Anesthesia Complications (+) PONV  Airway Mallampati: II  TM Distance: >3 FB Neck ROM: Full  Mouth opening: Limited Mouth Opening  Dental no notable dental hx. (+) Teeth Intact, Dental Advisory Given   Pulmonary asthma ,    Pulmonary exam normal breath sounds clear to auscultation       Cardiovascular hypertension, Pt. on medications Normal cardiovascular exam Rhythm:Regular Rate:Normal  TTE 2008 Normal LVEF, valves ok   Neuro/Psych CVA, No Residual Symptoms negative psych ROS   GI/Hepatic negative GI ROS, Neg liver ROS,   Endo/Other  diabetes, Well ControlledHypothyroidism PCOS  Renal/GU negative Renal ROS  negative genitourinary   Musculoskeletal negative musculoskeletal ROS (+)   Abdominal   Peds  Hematology negative hematology ROS (+)   Anesthesia Other Findings   Reproductive/Obstetrics                            Anesthesia Physical Anesthesia Plan  ASA: III  Anesthesia Plan: General   Post-op Pain Management:    Induction: Intravenous  PONV Risk Score and Plan: 4 or greater and Midazolam, Dexamethasone, Ondansetron, Scopolamine patch - Pre-op and TIVA  Airway Management Planned: Oral ETT  Additional Equipment:   Intra-op Plan:   Post-operative Plan: Extubation in OR  Informed Consent: I have reviewed the patients History and Physical, chart, labs and discussed the procedure including the risks, benefits and alternatives for the proposed anesthesia with the patient or authorized representative who has indicated his/her understanding and acceptance.     Dental advisory given  Plan Discussed with: CRNA  Anesthesia Plan Comments:        Anesthesia Quick  Evaluation

## 2019-11-11 NOTE — Transfer of Care (Signed)
  Post vital signs:  Last Vitals:  Vitals Value Taken Time  BP 152/95 11/11/19 0951  Temp 36.4 C 11/11/19 0951  Pulse 77 11/11/19 0952  Resp 13 11/11/19 0952  SpO2 100 % 11/11/19 0952  Vitals shown include unvalidated device data.  Last Pain:  Vitals:   11/11/19 0951  TempSrc: Oral  PainSc:       Patients Stated Pain Goal: 5 ([id number])  Complications:Immediate Anesthesia Transfer of Care Note  Patient: [name]  Procedure(s) Performed: Procedure(s) (LRB): Diagnostic LAPAROSCOPY  (Bilateral)  Patient Location: PACU  Anesthesia Type: General  Level of Consciousness: awake, alert  and oriented  Airway & Oxygen Therapy: Patient Spontanous Breathing and Patient connected to nasal cannula oxygen  Post-op Assessment: Report given to PACU RN and Post -op Vital signs reviewed and stable  Post vital signs: Reviewed and stable  Complications: No apparent anesthesia complications

## 2019-11-11 NOTE — Discharge Instructions (Signed)
Post Anesthesia Home Care Instructions  Activity: Get plenty of rest for the remainder of the day. A responsible adult should stay with you for 24 hours following the procedure.  For the next 24 hours, DO NOT: -Drive a car -Paediatric nurse -Drink alcoholic beverages -Take any medication unless instructed by your physician -Make any legal decisions or sign important papers.  Meals: Start with liquid foods such as gelatin or soup. Progress to regular foods as tolerated. Avoid greasy, spicy, heavy foods. If nausea and/or vomiting occur, drink only clear liquids until the nausea and/or vomiting subsides. Call your physician if vomiting continues.  Special Instructions/Symptoms: Your throat may feel dry or sore from the anesthesia or the breathing tube placed in your throat during surgery. If this causes discomfort, gargle with warm salt water. The discomfort should disappear within 24 hours.  If you had a scopolamine patch placed behind your ear for the management of post- operative nausea and/or vomiting:  1. The medication in the patch is effective for 72 hours, after which it should be removed.  Wrap patch in a tissue and discard in the trash. Wash hands thoroughly with soap and water. 2. You may remove the patch earlier than 72 hours if you experience unpleasant side effects which may include dry mouth, dizziness or visual disturbances. 3. Avoid touching the patch. Wash your hands with soap and water after contact with the patch.     Laparoscopa de diagnstico, cuidados posteriores Diagnostic Laparoscopy, Care After Lea esta informacin sobre cmo cuidarse despus del procedimiento. El mdico tambin podr darle instrucciones ms especficas. Comunquese con su mdico si tiene problemas o preguntas. Qu puedo esperar despus del procedimiento? Despus del procedimiento, es comn [employer] siguientes sntomas:  Molestias abdominales leves.  Dolor de Investment banker, operational. Las mujeres que se  somete a Furniture conservator/restorer laparoscopa con examen plvico pueden tener clicos leves y secrecin de lquido de la vagina durante unos das despus del procedimiento. Siga estas indicaciones en su casa: Medicamentos  [employer] de venta libre y los recetados solamente como se lo haya indicado el mdico.  Si le recetaron un antibitico, tmelo como se lo haya indicado el mdico. No deje de tomar los antibiticos aunque comience a Sports administrator. Conducir  No conduzca durante 24horas si recibi un medicamento para ayudarlo a relajarse (sedante) durante el procedimiento.  No conduzca ni use maquinaria pesada mientras toma analgsicos recetados. Baarse  No tome baos de inmersin, no nade ni use el jacuzzi hasta que el mdico lo autorice. Puede ducharse. Cuidados de la incisin   Siga las indicaciones del mdico acerca del cuidado de las incisiones. Haga lo siguiente: ? Lvese las manos con agua y jabn antes de cambiar la venda (vendaje). Use desinfectante para manos si no dispone de [place]. ? Cambie el vendaje como se lo haya indicado el mdico. ? No retire los puntos (suturas), la goma para cerrar la piel o las tiras [place]. Es posible que estos cierres cutneos Animal nutritionist en la piel durante 2semanas o ms tiempo. Si los bordes de las tiras adhesivas empiezan a despegarse y Therapist, sports, puede recortar los que estn sueltos. No retire las tiras [facility] por completo a menos que el mdico se lo indique.  [place] reas de la incisin para detectar signos de infeccin. Est atento a los siguientes signos: ? Dolor, hinchazn o enrojecimiento. ? Lquido o sangre. ? Calor. ? Pus o mal olor. Actividad  Retome sus [employer] se lo  haya indicado el mdico. Pregntele al mdico qu actividades son seguras para usted.  No levante ningn objeto que pese ms de 10libras (4,5kg) o el lmite de peso que le indiquen hasta que el mdico le diga que  puede [place]. Instrucciones generales  A fin de prevenir o tratar el estreimiento mientras toma analgsicos recetados, el mdico puede recomendarle lo siguiente: ? Beba suficiente lquido para mantener la orina de color amarillo plido. ? Tomar medicamentos recetados o de [employer]. ? Consumir alimentos ricos en fibra, como frutas y verduras frescas, cereales integrales y frijoles. ? Limitar el consumo de alimentos ricos en grasa y azcares procesados, como alimentos fritos o dulces.  No consuma ningn producto que contenga nicotina o tabaco, como cigarrillos y Psychologist, sport and exercise. Si necesita ayuda para dejar de fumar, consulte al mdico.  Concurra a todas las visitas de control como se lo haya indicado el mdico. Esto es importante. Comunquese con un mdico si:  Experimenta dolor de hombro.  Se siente mareado o se desmaya.  No puede eliminar los gases ni defecar.  Siente nuseas o vomita.  Presenta una erupcin cutnea.  Tiene enrojecimiento, hinchazn o dolor alrededor de alguna incisin.  Le sale lquido o sangre de alguna incisin.  Cualquiera de las incisiones est caliente al tacto.  Tiene pus o percibe que sale mal olor del lugar de alguna incisin.  Tiene fiebre o siente escalofros. Solicite ayuda de inmediato si:  Siente dolor intenso.  Tiene vmitos que no se calman.  Tiene una hemorragia por la vagina.  Alguna incisin se abre.  Tiene dificultad para respirar.  Siente dolor en el pecho. Resumen  Despus del procedimiento, es frecuente sentir molestias leves en el abdomen y dolor de Investment banker, operational.  [place] reas de la incisin para detectar signos de infeccin.  Retome sus actividades normales como se lo haya indicado el mdico. Pregntele al mdico qu actividades son seguras para usted. Esta informacin no tiene Marine scientist el consejo del mdico. Asegrese de hacerle al mdico cualquier pregunta que tenga. Document  Revised: 05/27/2017 Document Reviewed: 05/27/2017 Elsevier Patient Education  [place] INSTRUCTIONS: Laparoscopy  The following instructions have been prepared to help you care for yourself upon your return home today.  Wound care: Marland Kitchen Do not get the incision wet for the first 24 hours. The incision should be kept clean and dry. . The Band-Aids or dressings may be removed the day after surgery. . Should the incision become sore, red, and swollen after the first week, check with your doctor.  Personal hygiene: . Shower the day after your procedure.  Activity and limitations: . Do NOT drive or operate any equipment today. . Do NOT lift anything more than 15 pounds for 2-3 weeks after surgery. . Do NOT rest in bed all day. . Walking is encouraged. Walk each day, starting slowly with 5-minute walks 3 or 4 times a day. Slowly increase the length of your walks. . Walk up and down stairs slowly. . Do NOT do strenuous activities, such as golfing, playing tennis, bowling, running, biking, weight lifting, gardening, mowing, or vacuuming for 2-4 weeks. Ask your doctor when it is okay to start.  Diet: Eat a light meal as desired this evening. You may resume your usual diet tomorrow.  Return to work: This is dependent on the type of work you do. For the most part you can return to a desk job within a week of surgery. If you  are more active at work, please discuss this with your doctor.  What to expect after your surgery: You may have a slight burning sensation when you urinate on the first day. You may have a very small amount of blood in the urine. Expect to have a small amount of vaginal discharge/light bleeding for 1-2 weeks. It is not unusual to have abdominal soreness and bruising for up to 2 weeks. You may be tired and need more rest for about 1 week. You may experience shoulder pain for 24-72 hours. Lying flat in bed may relieve it.  Call your doctor for any of the  following: . Develop a fever of 100.4 or greater . Inability to urinate 6 hours after discharge from hospital . Severe pain not relieved by pain medications . Persistent of heavy bleeding at incision site . Redness or swelling around incision site after a week . Increasing nausea or vomiting  Patient Signature________________________________________ Nurse Signature_________________________________________

## 2019-11-11 NOTE — Interval H&P Note (Signed)
History and Physical Interval Note 11/11/2019 8:19 AM  [name]  has presented today for surgery, with the diagnosis of PELVIC PAIN OVARIAN CYST, HYDROSALPINX.  The various methods of treatment have been discussed with the patient with the help of a Spanish interpreter. Reemphasized concern about adhesive disease and risk of bowel injury; will halt procedure if there is too much adhesive disease to lower risk of injury to bowel or other surrounding organs. Patient agreed with this plan.  After consideration of risks, benefits and other options for treatment, the patient has consented to  Procedure(s): LAPAROSCOPIC BILATERAL SALPINGO-OOPHORECTOMY as a surgical intervention.  The patient's history has been reviewed, patient examined, no change in status, stable for surgery.  I have reviewed the patient's chart and labs.  Questions were answered to the patient's satisfaction.  To OR when ready.   [name], MD, [place] for [employer], [place]

## 2019-11-13 ENCOUNTER — Other Ambulatory Visit: Payer: Self-pay

## 2019-11-13 ENCOUNTER — Emergency Department (HOSPITAL_COMMUNITY)
Admission: EM | Admit: 2019-11-13 | Discharge: 2019-11-13 | Disposition: A | Discharge status: Home / Self Care | Payer: Self-pay | Attending: Emergency Medicine | Admitting: Emergency Medicine

## 2019-11-13 ENCOUNTER — Emergency Department (HOSPITAL_COMMUNITY): Payer: Self-pay

## 2019-11-13 DIAGNOSIS — I10 Essential (primary) hypertension: Secondary | ICD-10-CM | POA: Insufficient documentation

## 2019-11-13 DIAGNOSIS — E039 Hypothyroidism, unspecified: Secondary | ICD-10-CM | POA: Insufficient documentation

## 2019-11-13 DIAGNOSIS — E119 Type 2 diabetes mellitus without complications: Secondary | ICD-10-CM | POA: Insufficient documentation

## 2019-11-13 DIAGNOSIS — Z8673 Personal history of transient ischemic attack (TIA), and cerebral infarction without residual deficits: Secondary | ICD-10-CM | POA: Insufficient documentation

## 2019-11-13 DIAGNOSIS — J45909 Unspecified asthma, uncomplicated: Secondary | ICD-10-CM | POA: Insufficient documentation

## 2019-11-13 DIAGNOSIS — Z9049 Acquired absence of other specified parts of digestive tract: Secondary | ICD-10-CM | POA: Insufficient documentation

## 2019-11-13 DIAGNOSIS — Z7984 Long term (current) use of oral hypoglycemic drugs: Secondary | ICD-10-CM | POA: Insufficient documentation

## 2019-11-13 DIAGNOSIS — M25512 Pain in left shoulder: Secondary | ICD-10-CM | POA: Insufficient documentation

## 2019-11-13 DIAGNOSIS — Z79899 Other long term (current) drug therapy: Secondary | ICD-10-CM | POA: Insufficient documentation

## 2019-11-13 DIAGNOSIS — Z7982 Long term (current) use of aspirin: Secondary | ICD-10-CM | POA: Insufficient documentation

## 2019-11-13 LAB — CBC WITH DIFFERENTIAL/PLATELET
Abs Immature Granulocytes: 0.07 10*3/uL (ref 0.00–0.07)
Basophils Absolute: 0.1 10*3/uL (ref 0.0–0.1)
Basophils Relative: 1 %
Eosinophils Absolute: 0.2 10*3/uL (ref 0.0–0.5)
Eosinophils Relative: 2 %
HCT: 42.8 % (ref 36.0–46.0)
Hemoglobin: 13.6 g/dL (ref 12.0–15.0)
Immature Granulocytes: 1 %
Lymphocytes Relative: 34 %
Lymphs Abs: 3.1 10*3/uL (ref 0.7–4.0)
MCH: 29.2 pg (ref 26.0–34.0)
MCHC: 31.8 g/dL (ref 30.0–36.0)
MCV: 92 fL (ref 80.0–100.0)
Monocytes Absolute: 0.6 10*3/uL (ref 0.1–1.0)
Monocytes Relative: 7 %
Neutro Abs: 5.3 10*3/uL (ref 1.7–7.7)
Neutrophils Relative %: 55 %
Platelets: 145 10*3/uL — ABNORMAL LOW (ref 150–400)
RBC: 4.65 MIL/uL (ref 3.87–5.11)
RDW: 13.2 % (ref 11.5–15.5)
WBC: 9.3 10*3/uL (ref 4.0–10.5)
nRBC: 0 % (ref 0.0–0.2)

## 2019-11-13 LAB — COMPREHENSIVE METABOLIC PANEL
ALT: 43 U/L (ref 0–44)
AST: 25 U/L (ref 15–41)
Albumin: 3.8 g/dL (ref 3.5–5.0)
Alkaline Phosphatase: 78 U/L (ref 38–126)
Anion gap: 12 (ref 5–15)
BUN: 15 mg/dL (ref 6–20)
CO2: 25 mmol/L (ref 22–32)
Calcium: 9.1 mg/dL (ref 8.9–10.3)
Chloride: 103 mmol/L (ref 98–111)
Creatinine, Ser: 0.77 mg/dL (ref 0.44–1.00)
GFR calc Af Amer: >60 mL/min (ref >60)
GFR calc non Af Amer: >60 mL/min (ref >60)
Glucose, Bld: 101 mg/dL — ABNORMAL HIGH (ref 70–99)
Potassium: 4.1 mmol/L (ref 3.5–5.1)
Sodium: 140 mmol/L (ref 135–145)
Total Bilirubin: 0.5 mg/dL (ref 0.3–1.2)
Total Protein: 7.1 g/dL (ref 6.5–8.1)

## 2019-11-13 MED ORDER — HYDROCODONE-ACETAMINOPHEN 5-325 MG PO TABS
2.0000 | ORAL_TABLET | Freq: Once | ORAL | Status: AC
  Administered 2019-11-13: 22:00:00 2 via ORAL
  Filled 2019-11-13: qty 2

## 2019-11-13 MED ORDER — IOHEXOL 350 MG/ML SOLN
100.0000 mL | Freq: Once | INTRAVENOUS | Status: AC | PRN
  Administered 2019-11-13: 100 mL via INTRAVENOUS

## 2019-11-13 NOTE — ED Notes (Signed)
Patient verbalizes understanding of discharge instructions. Opportunity for questioning and answers were provided. Armband removed by staff, pt discharged from ED to home via POV  

## 2019-11-13 NOTE — Discharge Instructions (Addendum)
The pain in your shoulder may be from the air in your abdomen from your surgery.  Your chest ct is normal.  Follow up with your gynecologist as scheduled.  Return if pain worsens or if not continuing to improve

## 2019-11-13 NOTE — ED Notes (Signed)
Pt transported to CT ?

## 2019-11-13 NOTE — ED Triage Notes (Signed)
C/o left sided shoulder pain since las tnight. Stated she recently had a surgery on ovaries last Wednesday and was instructed by [facility] to come to ED. Pain is worst with movement, denies shortness or breathe, or chest pain.

## 2019-11-13 NOTE — ED Provider Notes (Signed)
[facility] EMERGENCY DEPARTMENT Provider Note   CSN: [id number] Arrival date & time: 11/13/19  1315     History Chief Complaint  Patient presents with  . Shoulder Pain    [name] is a 49 y.o. female.  The history is provided by the patient. No language interpreter was used.  Shoulder Pain Location:  Shoulder Shoulder location:  L shoulder Injury: no   Pain details:    Quality:  Aching   Radiates to:  Does not radiate   Severity:  Moderate   Onset quality:  Sudden   Duration:  2 days   Timing:  Constant Dislocation: no   Relieved by:  Nothing Worsened by:  Nothing Ineffective treatments:  None tried Pt had laproscopic surgery 2 days ago due to abdominal adhesions and ovarian cyst.  Pt complains of pain in her left shoulder,      Past Medical History:  Diagnosis Date  . Bilateral ovarian cysts   . Chronic pelvic pain in female   . Complication of anesthesia   . Hemorrhoids   . Hepatic steatosis 07/2006, 04/2014   on Abd CT done for pain  . History of anal fissures   . History of CVA (cerebrovascular accident) without residual deficits followed by pcp--- takes asa 81mg    04/ 2008--- due to cerebrovascular occlusion,  left basal ganglia subacute infarct;   per pt no residuals  . History of ectopic pregnancy    2007--  treated with medication  . History of hyperthyroidism    s/p  RAI   03-25-2001  . Hydrosalpinx   . Hypertension    followed by pcp  . Hypothyroidism, postradioiodine therapy    followed by pcp---  multinodular goiter;   s/p  RAI 03-25-2001 for hyperthyroidism  . PCOS (polycystic ovarian syndrome)   . Pelvic adhesions   . PONV (postoperative nausea and vomiting)   . Seasonal asthma    followed by pcp--- no inhaler, takes singulair  . Type II diabetes mellitus ([place]) dx'd 12/2015   followed by pcp ---   (11-06-2019  per pt does not check blood sugar at home)    Patient Active Problem List   Diagnosis Date Noted  . Breast pain, left 07/23/2019  . 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 Mass of occipital region 01/11/2017  . Encounter for well adult exam with abnormal findings 01/11/2017  . Controlled diabetes mellitus type 2 with complications ([place]) [id number]  . Endometrioma   . Chronic female pelvic pain 05/07/2012  . Anemia 04/03/2011  . MIGRAINE HEADACHE 05/12/2007  . Cerebral artery occlusion with cerebral infarction ([place]) 12/12/2006  . Hypothyroidism 10/31/2006  . HLD (hyperlipidemia) 10/31/2006  . Overweight (BMI 25.0-29.9) 10/31/2006  . HYPERTENSION, BENIGN SYSTEMIC 10/31/2006  . Asthma 10/31/2006  . UMBILICAL HERNIA [id number]    Past Surgical History:  Procedure Laterality Date  . ABDOMINAL HYSTERECTOMY  04/24/2012   Procedure: HYSTERECTOMY ABDOMINAL;  Surgeon: Terrance Mass, MD;  Location: [facility];  Service: Gynecology;;  . ANAL SPHINCTEROTOMY  05-08-2019    [facility]  . APPENDECTOMY  1997   ruptured  . CERVICAL CERCLAGE  11-06-2008;  10-07-2010  @[facility]   . CESAREAN SECTION W/BTL Bilateral 12-24-2010   @[facility]    bilateral tubal ligation with filshie clips  . COLONOSCOPY  last one 05-08-2019  @[facility]   anal fissure, polyps (TA, HP), rpt 7 yrs ([facility] @ [facility])  . ESOPHAGOGASTRODUODENOSCOPY  10/2014   WNL  . EXCISION OF ENDOMETRIOMA N/A 08/20/2016  Procedure: EXCISION OF ENDOMETRIOMA LEFT LOWER QUADRANT;  Surgeon: Georganna Skeans, MD;  Location: [facility];  Service: General;  Laterality: N/A;  . EXPLORATORY LAPAROTOMY  03-03-2004    @[facility]    WITH LYSIS ADHESIONS  . FLEXIBLE SIGMOIDOSCOPY  03/12/2019    @[facility]   s/p botox injection for anal fissure ([facility] @ [facility])  . LAPAROSCOPIC CHOLECYSTECTOMY  09/1995  . LAPAROSCOPIC HYSTERECTOMY  04/24/2012   Procedure: HYSTERECTOMY TOTAL LAPAROSCOPIC;  Surgeon: Terrance Mass, MD;  Location: [facility];  Service: Gynecology;  Laterality: N/A;  2 1/2 hours OR time.  Dr. Phineas Real to assisting   . LAPAROSCOPIC LYSIS  INTESTINAL ADHESIONS    . LAPAROSCOPIC SALPINGO OOPHERECTOMY Bilateral 11/11/2019   Procedure: Diagnostic LAPAROSCOPY ;  Surgeon: Osborne Oman, MD;  Location: [facility];  Service: Gynecology;  Laterality: Bilateral;     OB History    Gravida  5   Para  1   Term      Preterm  1   AB  3   Living  1     SAB  3   TAB      Ectopic      Multiple      Live Births              Family History  Problem Relation Age of Onset  . Hypertension Father   . Stroke Father        hemorrhagic, deceased  . Diabetes Mother   . Uterine cancer Mother   . Coronary artery disease Neg Hx   . Cancer Neg Hx     Social History   Tobacco Use  . Smoking status: Never Smoker  . Smokeless tobacco: Never Used  Substance Use Topics  . Alcohol use: No  . Drug use: No    Home Medications Prior to Admission medications   Medication Sig Start Date End Date Taking? Authorizing Provider  acetaminophen (TYLENOL) 500 MG tablet Take 1,000 mg by mouth as needed for moderate pain.    Yes [provider]  aspirin 81 MG tablet Take 1 tablet (81 mg total) by mouth daily. 01/11/17  Yes Ria Bush, MD  Calcium Citrate-Vitamin D (CALCIUM + D PO) Take 1 tablet by mouth daily.    Yes [provider]  docusate sodium (COLACE) 100 MG capsule Take 1 capsule (100 mg total) by mouth 2 (two) times daily as needed for mild constipation or moderate constipation. 11/11/19  Yes Anyanwu, Sallyanne Havers, MD  levothyroxine (SYNTHROID) 200 MCG tablet Take 200 mcg by mouth every morning. 10/05/19  Yes [provider]  lisinopril-hydrochlorothiazide (ZESTORETIC) 20-25 MG tablet Take 1 tablet by mouth daily. Patient taking differently: Take 1 tablet by mouth daily.  03/16/19  Yes Ria Bush, MD  metFORMIN (GLUCOPHAGE) 500 MG tablet Take 1 tablet (500 mg total) by mouth daily with breakfast. Patient taking differently: Take 500 mg by mouth daily with breakfast.  03/16/19   Yes Ria Bush, MD  montelukast (SINGULAIR) 10 MG tablet Take 1 tablet (10 mg total) by mouth at bedtime. 03/16/19  Yes Ria Bush, MD  oxyCODONE-acetaminophen (PERCOCET/ROXICET) 5-325 MG tablet Take 1 tablet by mouth every 4 (four) hours as needed for severe pain. 11/11/19  Yes Anyanwu, Sallyanne Havers, MD  vitamin E (VITAMIN E) 180 MG (400 UNITS) capsule Take 400 Units by mouth daily.   Yes [provider]    Allergies    Voltaren [diclofenac sodium]  Review of Systems  Review of Systems  All other systems reviewed and are negative.   Physical Exam Updated Vital Signs BP (!) 134/91 (BP Location: Left Arm)   Pulse 70   Temp 97.7 F (36.5 C) (Oral)   Resp 16   Ht 5\' 2"  (1.575 m)   Wt 78 kg   LMP 11/12/2011   SpO2 98%   BMI 31.46 kg/m   Physical Exam Vitals reviewed.  HENT:     Head: Normocephalic.     Nose: Nose normal.     Mouth/Throat:     Mouth: Mucous membranes are moist.  Cardiovascular:     Rate and Rhythm: Normal rate.     Pulses: Normal pulses.  Pulmonary:     Effort: Pulmonary effort is normal.  Abdominal:     General: Abdomen is flat.  Musculoskeletal:        General: Tenderness present. Normal range of motion.     Cervical back: Normal range of motion.  Skin:    General: Skin is warm.  Neurological:     General: No focal deficit present.     Mental Status: She is alert.  Psychiatric:        Mood and Affect: Mood normal.     ED Results / Procedures / Treatments   Labs (all labs ordered are listed, but only abnormal results are displayed) Labs Reviewed - No data to display  EKG EKG Interpretation  Date/Time:  Friday November 13 2019 13:42:05 EST Ventricular Rate:  70 PR Interval:  128 QRS Duration: 94 QT Interval:  398 QTC Calculation: 429 R Axis:   71 Text Interpretation: Normal sinus rhythm Normal ECG No significant change since last tracing Confirmed by Quintella Reichert [phone]) on 11/13/2019 4:58:55 PM   Radiology No  results found.  Procedures Procedures (including critical care time)  Medications Ordered in ED Medications - No data to display  ED Course  I have reviewed the triage vital signs and the nursing notes.  Pertinent labs & imaging results that were available during my care of the patient were reviewed by me and considered in my medical decision making (see chart for details).    MDM Rules/Calculators/A&P                      MDM:  Labs reviewed.  Ct angio chest shows no evidence of PE.  Pt given 2 hydrocodone.  Pt reports she feels better.  Pt advised to follow up with her OBgyn.   Final Clinical Impression(s) / ED Diagnoses Final diagnoses:  Acute pain of left shoulder    Rx / DC Orders ED Discharge Orders    None       Sidney Ace 11/13/19 2356    Quintella Reichert, MD 11/15/19 1454

## 2019-11-18 ENCOUNTER — Ambulatory Visit: Payer: Self-pay | Admitting: Obstetrics & Gynecology

## 2019-11-19 ENCOUNTER — Other Ambulatory Visit: Payer: Self-pay

## 2019-11-19 ENCOUNTER — Other Ambulatory Visit (INDEPENDENT_AMBULATORY_CARE_PROVIDER_SITE_OTHER): Payer: Self-pay

## 2019-11-19 DIAGNOSIS — E039 Hypothyroidism, unspecified: Secondary | ICD-10-CM

## 2019-11-19 LAB — T4, FREE: Free T4: 1.42 ng/dL (ref 0.60–1.60)

## 2019-11-19 LAB — TSH: TSH: 0.17 u[IU]/mL — ABNORMAL LOW (ref 0.35–4.50)

## 2019-11-29 ENCOUNTER — Other Ambulatory Visit: Payer: Self-pay | Admitting: Family Medicine

## 2019-12-01 ENCOUNTER — Telehealth: Payer: Self-pay

## 2019-12-01 NOTE — Telephone Encounter (Signed)
Ria Bush, MD  11/29/2019 1:03 PM EDT    Plz notify thyroid function remains too high - what dose is she currently taking? I believe she is on levothyroxine 118mcg. Plz verify with her. Let me know what she is on so we can drop to next lowest dose.     Used language line and talked with interpreter, Shanon Brow, [phone] who tried to contact the pt and her spouse but both numbers said they were not accepting calls at this time. No Vm was available to leave a msg.

## 2019-12-02 ENCOUNTER — Encounter: Payer: Self-pay | Admitting: Obstetrics & Gynecology

## 2019-12-02 ENCOUNTER — Ambulatory Visit (INDEPENDENT_AMBULATORY_CARE_PROVIDER_SITE_OTHER): Payer: Self-pay | Admitting: Obstetrics & Gynecology

## 2019-12-02 ENCOUNTER — Other Ambulatory Visit: Payer: Self-pay

## 2019-12-02 VITALS — BP 135/82 | HR 92 | Ht 62.0 in | Wt 167.1 lb

## 2019-12-02 DIAGNOSIS — Z603 Acculturation difficulty: Secondary | ICD-10-CM

## 2019-12-02 DIAGNOSIS — Z09 Encounter for follow-up examination after completed treatment for conditions other than malignant neoplasm: Secondary | ICD-10-CM

## 2019-12-02 NOTE — Progress Notes (Signed)
     Subjective:     [name] is a 49 y.o. [phone] female who presents to the clinic 3 weeks status post diagnostic laparoscopy for chronic pelvic pain, ovarian cyst and hydrosalpinx.  Patient is Spanish-speaking only, interpreter present for this encounter.  Surgery was unable to be completed due to extensive abdominopelvic adhesive disease and inability to visualize adnexa.  However, patient reports feeling much better.  Eating a regular diet without difficulty. Bowel movements are normal. The patient is not having any pain.  The following portions of the patient's history were reviewed and updated as appropriate: allergies, current medications, past family history, past medical history, past social history, past surgical history and problem list.  Review of Systems Pertinent items noted in HPI and remainder of comprehensive ROS otherwise negative.    Objective:    BP 135/82   Pulse 92   Ht 5\' 2"  (1.575 m)   Wt 167 lb 1.6 oz (75.8 kg)   LMP 11/12/2011   BMI 30.56 kg/m  General:  alert and no distress  Abdomen: soft, bowel sounds active, non-tender  Incision:   healing well, no drainage, no erythema, no hernia, no seroma, no swelling, no dehiscence, incision well approximated     Assessment:    Doing well postoperatively. Operative findings again reviewed in detail.  Recommended no surgical intervention unless she was willing to go with General Surgery and also run the risk of bowel injury and its sequelae. Pathology report discussed.    Plan:   1. Continue any current medications. 2. Wound care discussed. 3. Activity restrictions: none 4. Anticipated return to work: not applicable. 5. Follow up as needed.     [name], MD, [place] for [employer], [place]

## 2019-12-15 ENCOUNTER — Other Ambulatory Visit: Payer: Self-pay | Admitting: Family Medicine

## 2019-12-15 MED ORDER — LEVOTHYROXINE SODIUM 175 MCG PO TABS: 175.0000 ug | ORAL_TABLET | Freq: Every morning | ORAL | 1 refills | Status: DC

## 2019-12-16 ENCOUNTER — Ambulatory Visit: Payer: Self-pay | Attending: Internal Medicine

## 2019-12-16 DIAGNOSIS — Z23 Encounter for immunization: Secondary | ICD-10-CM

## 2019-12-16 NOTE — Progress Notes (Signed)
   Covid-19 Vaccination Clinic  Name:  [name] [name] [name]    MRN: [id number] DOB: 1970/10/01  12/16/2019  [name] was observed post Covid-19 immunization for 15 minutes without incident. She was provided with Vaccine Information Sheet and instruction to access the V-Safe system.   [name] was instructed to call 911 with any severe reactions post vaccine: Marland Kitchen Difficulty breathing  . Swelling of face and throat  . A fast heartbeat  . A bad rash all over body  . Dizziness and weakness   Immunizations Administered    Name Date Dose VIS Date Route   Pfizer COVID-19 Vaccine 12/16/2019  3:44 PM 0.3 mL 08/14/2019 Intramuscular   Manufacturer: [place]   Lot: [id number]   [place]: [id number]

## 2019-12-30 ENCOUNTER — Other Ambulatory Visit: Payer: Self-pay | Admitting: Family Medicine

## 2020-01-06 ENCOUNTER — Ambulatory Visit: Payer: Self-pay | Attending: Internal Medicine

## 2020-01-06 DIAGNOSIS — Z23 Encounter for immunization: Secondary | ICD-10-CM

## 2020-01-06 NOTE — Progress Notes (Signed)
   Covid-19 Vaccination Clinic  Name:  Pearline Cables [facility] Ailaina Kalp    MRN: [id number] DOB: [birth year]  01/06/2020  Ms. Birky was observed post Covid-19 immunization for 15 minutes without incident. She was provided with Vaccine Information Sheet and instruction to access the V-Safe system.   Ms. Flees was instructed to call 911 with any severe reactions post vaccine: Marland Kitchen Difficulty breathing  . Swelling of face and throat  . A fast heartbeat  . A bad rash all over body  . Dizziness and weakness   Immunizations Administered    Name Date Dose VIS Date [place] COVID-19 Vaccine 01/06/2020  8:18 AM 0.3 mL 10/28/2018 Intramuscular   Manufacturer: [place]   Lot: [id number]   [place]: [id number]

## 2020-01-07 ENCOUNTER — Telehealth: Payer: Self-pay

## 2020-01-07 NOTE — Telephone Encounter (Signed)
Patient states that she received her second vaccine yesterday and has some body aches, and fever. Patient states that she is taking Tylenol every 4-6 hours a needed. Patient advise to continue to hydrate and rest and to go to ED if she starts to develop any serve symptoms. Patient also advise to continue her pcp to see if they would recommend any thing different. Patient will call doctor office today

## 2020-01-07 NOTE — Telephone Encounter (Signed)
Noted  

## 2020-01-07 NOTE — Telephone Encounter (Signed)
Pt's husband said pt had second covid vaccine on 01/06/20; this morning pt has fever 100.4, all joints are hurting at pain level of 8 and pt has headache, pain level of 8. pts husband said pt needs to be seen. Costella Hatcher will take pt to [facility] on [address]. FYI to Dr Darnell Level.

## 2020-01-07 NOTE — Telephone Encounter (Signed)
Noted. plz call tomorrow for an update.  

## 2020-01-08 ENCOUNTER — Encounter: Payer: Self-pay | Admitting: Family Medicine

## 2020-01-08 NOTE — Telephone Encounter (Signed)
Spoke with pt for update on sxs.  Says she was did not go to [facility].  States she is feeling better.  Still has HA sometimes, body aches, chills and fever- 99- 100.2.  Pt is taking Tylenol.  Advised pt, per Dr. Darnell Level, to continue to monitor her sxs and continue Tylenol.  If sxs worsen, she needs to seek urgent care.  Pt verbalizes understanding.  FYI to Dr. Darnell Level.

## 2020-01-25 ENCOUNTER — Other Ambulatory Visit: Payer: No Typology Code available for payment source

## 2020-02-08 ENCOUNTER — Ambulatory Visit (HOSPITAL_COMMUNITY)
Admission: EM | Admit: 2020-02-08 | Discharge: 2020-02-08 | Disposition: A | Discharge status: Home / Self Care | Payer: Self-pay | Attending: Family Medicine | Admitting: Family Medicine

## 2020-02-08 ENCOUNTER — Encounter (HOSPITAL_COMMUNITY): Payer: Self-pay | Admitting: Emergency Medicine

## 2020-02-08 ENCOUNTER — Other Ambulatory Visit: Payer: Self-pay

## 2020-02-08 DIAGNOSIS — S0502XA Injury of conjunctiva and corneal abrasion without foreign body, left eye, initial encounter: Secondary | ICD-10-CM

## 2020-02-08 DIAGNOSIS — H5789 Other specified disorders of eye and adnexa: Secondary | ICD-10-CM

## 2020-02-08 MED ORDER — FLUORESCEIN SODIUM 1 MG OP STRP
ORAL_STRIP | OPHTHALMIC | Status: AC
  Filled 2020-02-08: qty 1

## 2020-02-08 MED ORDER — TETRACAINE HCL 0.5 % OP SOLN
OPHTHALMIC | Status: AC
  Filled 2020-02-08: qty 4

## 2020-02-08 MED ORDER — OFLOXACIN 0.3 % OP SOLN: 1.0000 [drp] | Freq: Four times a day (QID) | OPHTHALMIC | 0 refills | Status: AC

## 2020-02-08 NOTE — ED Provider Notes (Signed)
[facility]   [id number] 02/08/20 Arrival Time: 7425  CC: EYE REDNESS  SUBJECTIVE:  [name] is a 49 y.o. female who presents with complaint of eye irritation that began abruptly this morning. Reports foreign body sensation to L eye. Denies a precipitating event, trauma, or close contacts with similar symptoms. Has tried OTC eye drops without relief. Symptoms are made worse with blinking. Denies similar symptoms in the past. Denies fever, chills, nausea, vomiting, eye pain, painful eye movements, halos, discharge, itching, vision changes, double vision, periorbital erythema.     Denies contact lens use.    ROS: As per HPI.  All other pertinent ROS negative.     Past Medical History:  Diagnosis Date  . Bilateral ovarian cysts   . Chronic pelvic pain in female   . Complication of anesthesia   . Hemorrhoids   . Hepatic steatosis 07/2006, 04/2014   on Abd CT done for pain  . History of anal fissures   . History of CVA (cerebrovascular accident) without residual deficits followed by pcp--- takes asa 81mg    04/ 2008--- due to cerebrovascular occlusion,  left basal ganglia subacute infarct;   per pt no residuals  . History of ectopic pregnancy    2007--  treated with medication  . History of hyperthyroidism    s/p  RAI   03-25-2001  . Hydrosalpinx   . Hypertension    followed by pcp  . Hypothyroidism, postradioiodine therapy    followed by pcp---  multinodular goiter;   s/p  RAI 03-25-2001 for hyperthyroidism  . PCOS (polycystic ovarian syndrome)   . Pelvic adhesions   . PONV (postoperative nausea and vomiting)   . Seasonal asthma    followed by pcp--- no inhaler, takes singulair  . Type II diabetes mellitus ([place]) dx'd 12/2015   followed by pcp ---   (11-06-2019  per pt does not check blood sugar at home)   Past Surgical History:  Procedure Laterality Date  . ABDOMINAL HYSTERECTOMY  04/24/2012   Procedure: HYSTERECTOMY ABDOMINAL;  Surgeon:  Terrance Mass, MD;  Location: [facility];  Service: Gynecology;;  . ANAL SPHINCTEROTOMY  05-08-2019    @[facility]  . APPENDECTOMY  1997   ruptured  . CERVICAL CERCLAGE  11-06-2008;  10-07-2010  @[facility]   . CESAREAN SECTION W/BTL Bilateral 12-24-2010   @[facility]    bilateral tubal ligation with filshie clips  . COLONOSCOPY  last one 05-08-2019  @[facility]   anal fissure, polyps (TA, HP), rpt 7 yrs ([facility] @ [facility])  . ESOPHAGOGASTRODUODENOSCOPY  10/2014   WNL  . EXCISION OF ENDOMETRIOMA N/A 08/20/2016   Procedure: EXCISION OF ENDOMETRIOMA LEFT LOWER QUADRANT;  Surgeon: Georganna Skeans, MD;  Location: [facility];  Service: General;  Laterality: N/A;  . EXPLORATORY LAPAROTOMY  03-03-2004    @[facility]    WITH LYSIS ADHESIONS  . FLEXIBLE SIGMOIDOSCOPY  03/12/2019    @[facility]   s/p botox injection for anal fissure ([facility] @ [facility])  . LAPAROSCOPIC CHOLECYSTECTOMY  09/1995  . LAPAROSCOPIC HYSTERECTOMY  04/24/2012   Procedure: HYSTERECTOMY TOTAL LAPAROSCOPIC;  Surgeon: Terrance Mass, MD;  Location: [facility];  Service: Gynecology;  Laterality: N/A;  2 1/2 hours OR time.  Dr. Phineas Real to assisting   . LAPAROSCOPIC LYSIS INTESTINAL ADHESIONS    . LAPAROSCOPIC SALPINGO OOPHERECTOMY Bilateral 11/11/2019   diagnostic laparoscopy unsuccessful - extensive adhesions, unable to visualize adnexa (Anyanwu, Sallyanne Havers, MD)   Allergies  Allergen Reactions  . Voltaren [Diclofenac Sodium] Rash  Diffuse pruritic urticarial rash    No current facility-administered medications on file prior to encounter.   Current Outpatient Medications on File Prior to Encounter  Medication Sig Dispense Refill  . acetaminophen (TYLENOL) 500 MG tablet Take 1,000 mg by mouth as needed for moderate pain.     Marland Kitchen aspirin 81 MG tablet Take 1 tablet (81 mg total) by mouth daily. (Patient not taking: Reported on 12/02/2019)    . Calcium Citrate-Vitamin D (CALCIUM + D PO) Take 1 tablet by mouth daily.     Marland Kitchen docusate sodium (COLACE) 100 MG  capsule Take 1 capsule (100 mg total) by mouth 2 (two) times daily as needed for mild constipation or moderate constipation. 30 capsule 2  . levothyroxine (SYNTHROID) 175 MCG tablet Take 1 tablet (175 mcg total) by mouth every morning. 90 tablet 1  . lisinopril-hydrochlorothiazide (ZESTORETIC) 20-25 MG tablet Take 1 tablet by mouth daily 90 tablet 0  . metFORMIN (GLUCOPHAGE) 500 MG tablet Take 1 tablet (500 mg total) by mouth daily with breakfast. (Patient taking differently: Take 500 mg by mouth daily with breakfast. ) 90 tablet 1  . montelukast (SINGULAIR) 10 MG tablet Take 1 tablet (10 mg total) by mouth at bedtime. 90 tablet 3  . oxyCODONE-acetaminophen (PERCOCET/ROXICET) 5-325 MG tablet Take 1 tablet by mouth every 4 (four) hours as needed for severe pain. (Patient not taking: Reported on 12/02/2019) 30 tablet 0  . vitamin E (VITAMIN E) 180 MG (400 UNITS) capsule Take 400 Units by mouth daily.     Social History   Socioeconomic History  . Marital status: Married    Spouse name: Not on file  . Number of children: 1  . Years of education: Not on file  . Highest education level: 8th grade  Occupational History  . Not on file  Tobacco Use  . Smoking status: Never Smoker  . Smokeless tobacco: Never Used  Substance and Sexual Activity  . Alcohol use: No  . Drug use: No  . Sexual activity: Not on file    Comment: HYSTERECTOMY  Other Topics Concern  . Not on file  Social History Narrative   Caffeine: none   Lives with husband and 1 daughter (2012)   Occupation: stay at home mom, prior was daycare provider   Edu: 9th grade   Activity: walks daily 1/2-1 hour   Diet: good water, fruits/vegetables daily   Social Determinants of Health   Financial Resource Strain:   . Difficulty of Paying Living Expenses:   Food Insecurity:   . Worried About Charity fundraiser in the Last Year:   . Arboriculturist in the Last Year:   Transportation Needs: Unmet Transportation Needs  . Lack of  Transportation (Medical): Yes  . Lack of Transportation (Non-Medical): Not on file  Physical Activity:   . Days of Exercise per Week:   . Minutes of Exercise per Session:   Stress:   . Feeling of Stress :   Social Connections:   . Frequency of Communication with Friends and Family:   . Frequency of Social Gatherings with Friends and Family:   . Attends Religious Services:   . Active Member of Clubs or Organizations:   . Attends Archivist Meetings:   Marland Kitchen Marital Status:   Intimate Partner Violence:   . Fear of Current or Ex-Partner:   . Emotionally Abused:   Marland Kitchen Physically Abused:   . Sexually Abused:    Family History  Problem Relation Age  of Onset  . Hypertension Father   . Stroke Father        hemorrhagic, deceased  . Diabetes Mother   . Uterine cancer Mother   . Coronary artery disease Neg Hx   . Cancer Neg Hx     OBJECTIVE:    Visual Acuity      Vitals:   02/08/20 1630  BP: (!) 149/98  Pulse: 83  Resp: 14  Temp: 98.1 F (36.7 C)  TempSrc: Oral  SpO2: 100%    General appearance: alert; no distress Eyes: No conjunctival erythema. PERRL; EOMI without discomfort;  no obvious drainage; lid everted without obvious FB; minimal fluorescein uptake to L side of sclera just outside of iris, 45mm Neck: supple Lungs: clear to auscultation bilaterally Heart: regular rate and rhythm Skin: warm and dry Psychological: alert and cooperative; normal mood and affect   ASSESSMENT & PLAN:  1. Abrasion of left cornea, initial encounter   2. Eye irritation     Meds ordered this encounter  Medications  . ofloxacin (OCUFLOX) 0.3 % ophthalmic solution    Sig: Place 1 drop into the left eye 4 (four) times daily for 5 days.    Dispense:  5 mL    Refill:  0    Order Specific Question:   Supervising Provider    Answer:   Chase Picket [id number]    Corneal abrasion Use ofloxacin as prescribed and to completion Use OTC systane or genteal gel eye drops at night  as needed for symptomatic relief Use OTC ibuprofen or tylenol as needed for pain relief Return here or follow up with ophthamolgy if symptoms persists or worsen such as fever, chills, redness, swelling, eye pain, painful eye movements, vision changes.  Reviewed expectations re: course of current medical issues. Questions answered. Outlined signs and symptoms indicating need for more acute intervention. Patient verbalized understanding. After Visit Summary given.    Faustino Congress, NP 02/09/20 1339

## 2020-02-08 NOTE — Discharge Instructions (Signed)
Lacri lube ointment  Follow up as needed

## 2020-02-08 NOTE — ED Triage Notes (Signed)
Pt c/o left eye pain onset this morning. Pt states she feels like there is something in her eye. She uses eye drops and redness clears up but still feels pain. Vision is blurry right now. Triage completed with the help of [place] interpreter.

## 2020-02-18 ENCOUNTER — Other Ambulatory Visit: Payer: Self-pay

## 2020-02-18 ENCOUNTER — Ambulatory Visit
Admission: RE | Admit: 2020-02-18 | Discharge: 2020-02-18 | Disposition: A | Discharge status: Home / Self Care | Payer: No Typology Code available for payment source | Source: Ambulatory Visit | Attending: Obstetrics and Gynecology | Admitting: Obstetrics and Gynecology

## 2020-02-18 ENCOUNTER — Ambulatory Visit
Admission: RE | Admit: 2020-02-18 | Discharge: 2020-02-18 | Disposition: A | Discharge status: Home / Self Care | Payer: Self-pay | Source: Ambulatory Visit | Attending: Obstetrics and Gynecology | Admitting: Obstetrics and Gynecology

## 2020-02-18 DIAGNOSIS — R928 Other abnormal and inconclusive findings on diagnostic imaging of breast: Secondary | ICD-10-CM

## 2020-03-03 ENCOUNTER — Other Ambulatory Visit: Payer: Self-pay | Admitting: Obstetrics and Gynecology

## 2020-03-03 DIAGNOSIS — N6320 Unspecified lump in the left breast, unspecified quadrant: Secondary | ICD-10-CM

## 2020-03-16 ENCOUNTER — Other Ambulatory Visit: Payer: Self-pay

## 2020-04-05 ENCOUNTER — Other Ambulatory Visit: Payer: Self-pay

## 2020-04-05 ENCOUNTER — Ambulatory Visit
Admission: RE | Admit: 2020-04-05 | Discharge: 2020-04-05 | Disposition: A | Discharge status: Home / Self Care | Payer: No Typology Code available for payment source | Source: Ambulatory Visit | Attending: Obstetrics and Gynecology | Admitting: Obstetrics and Gynecology

## 2020-04-05 DIAGNOSIS — N6320 Unspecified lump in the left breast, unspecified quadrant: Secondary | ICD-10-CM

## 2020-04-26 ENCOUNTER — Other Ambulatory Visit: Payer: Self-pay | Admitting: Family Medicine

## 2020-04-26 NOTE — Telephone Encounter (Signed)
E-scribed refill.  Plz schedule cpe and lab visits.  

## 2020-05-04 ENCOUNTER — Other Ambulatory Visit: Payer: Self-pay | Admitting: Family Medicine

## 2020-05-05 ENCOUNTER — Other Ambulatory Visit: Payer: Self-pay | Admitting: Family Medicine

## 2020-07-07 ENCOUNTER — Other Ambulatory Visit: Payer: Self-pay | Admitting: Family Medicine

## 2020-07-07 NOTE — Telephone Encounter (Signed)
E-scribed refill.  Plz schedule cpe and lab visits.  She 'no-showed' for 03/16/20 labs and is overdue for CPE.

## 2020-07-08 NOTE — Telephone Encounter (Signed)
Patient is scheduled. EM 07/08/20

## 2020-08-05 ENCOUNTER — Other Ambulatory Visit: Payer: Self-pay | Admitting: Family Medicine

## 2020-09-21 ENCOUNTER — Other Ambulatory Visit: Payer: No Typology Code available for payment source

## 2020-09-21 ENCOUNTER — Other Ambulatory Visit: Payer: Self-pay | Admitting: Family Medicine

## 2020-09-21 DIAGNOSIS — E039 Hypothyroidism, unspecified: Secondary | ICD-10-CM

## 2020-09-21 DIAGNOSIS — Z1159 Encounter for screening for other viral diseases: Secondary | ICD-10-CM

## 2020-09-21 DIAGNOSIS — E118 Type 2 diabetes mellitus with unspecified complications: Secondary | ICD-10-CM

## 2020-09-21 DIAGNOSIS — E785 Hyperlipidemia, unspecified: Secondary | ICD-10-CM

## 2020-09-21 DIAGNOSIS — D649 Anemia, unspecified: Secondary | ICD-10-CM

## 2020-09-28 ENCOUNTER — Encounter: Payer: No Typology Code available for payment source | Admitting: Family Medicine

## 2020-10-01 ENCOUNTER — Encounter (HOSPITAL_COMMUNITY): Payer: Self-pay

## 2020-10-01 ENCOUNTER — Ambulatory Visit (HOSPITAL_COMMUNITY)
Admission: EM | Admit: 2020-10-01 | Discharge: 2020-10-01 | Disposition: A | Discharge status: Home / Self Care | Payer: No Typology Code available for payment source | Attending: Medical Oncology | Admitting: Medical Oncology

## 2020-10-01 ENCOUNTER — Other Ambulatory Visit: Payer: Self-pay

## 2020-10-01 DIAGNOSIS — J069 Acute upper respiratory infection, unspecified: Secondary | ICD-10-CM

## 2020-10-01 MED ORDER — FLUTICASONE PROPIONATE 50 MCG/ACT NA SUSP: 2.0000 | Freq: Every day | NASAL | 0 refills | Status: DC

## 2020-10-01 MED ORDER — BENZONATATE 100 MG PO CAPS
100.0000 mg | ORAL_CAPSULE | Freq: Three times a day (TID) | ORAL | 0 refills | Status: DC
Start: 2020-10-01 — End: 2020-12-07

## 2020-10-01 NOTE — ED Provider Notes (Signed)
[facility]    CSN: [id number] Arrival date & time: 10/01/20  1601      History   Chief Complaint Chief Complaint  Patient presents with  . Sore Throat    X 5 days  . Otalgia    Left primarily x 5 days    HPI [name] [name] is a 50 y.o. female. She declines interpretor today and is able to answer my questions and explain understanding back to me.   HPI   Cold Symptoms: Pt states that for the past 5 days she has had cold symptoms of sore throat, otalgia, nasal congestion, sinus drainage. She has had loss of taste and smell along with a fever of 102F at home. She denies chest pain, SOB, vomiting, diarrhea. She has not taken anything for symptoms. No known sick contacts. She asks what she needs to be concerns with since she is vaccinated and likely has COVID-19. She also asks about how long the sore throat typically lasts.  She has had her [place] vaccination series.    Past Medical History:  Diagnosis Date  . Bilateral ovarian cysts   . Chronic pelvic pain in female   . Complication of anesthesia   . Hemorrhoids   . Hepatic steatosis 07/2006, 04/2014   on Abd CT done for pain  . History of anal fissures   . History of CVA (cerebrovascular accident) without residual deficits followed by pcp--- takes asa 81mg    04/ 2008--- due to cerebrovascular occlusion,  left basal ganglia subacute infarct;   per pt no residuals  . History of ectopic pregnancy    2007--  treated with medication  . History of hyperthyroidism    s/p  RAI   03-25-2001  . Hydrosalpinx   . Hypertension    followed by pcp  . Hypothyroidism, postradioiodine therapy    followed by pcp---  multinodular goiter;   s/p  RAI 03-25-2001 for hyperthyroidism  . PCOS (polycystic ovarian syndrome)   . Pelvic adhesions   . PONV (postoperative nausea and vomiting)   . Seasonal asthma    followed by pcp--- no inhaler, takes singulair  . Type II diabetes mellitus ([place]) dx'd 12/2015    followed by pcp ---   (11-06-2019  per pt does not check blood sugar at home)    Patient Active Problem List   Diagnosis Date Noted  . Breast pain, left 07/23/2019  . 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 Mass of occipital region 01/11/2017  . Encounter for well adult exam with abnormal findings 01/11/2017  . Controlled diabetes mellitus type 2 with complications ([place]) 40/97/3532  . Endometrioma   . Chronic female pelvic pain 05/07/2012  . Anemia 04/03/2011  . MIGRAINE HEADACHE 05/12/2007  . Cerebral artery occlusion with cerebral infarction ([place]) 12/12/2006  . Hypothyroidism 10/31/2006  . HLD (hyperlipidemia) 10/31/2006  . Overweight (BMI 25.0-29.9) 10/31/2006  . HYPERTENSION, BENIGN SYSTEMIC 10/31/2006  . Asthma 10/31/2006  . UMBILICAL HERNIA 99/24/2683    Past Surgical History:  Procedure Laterality Date  . ABDOMINAL HYSTERECTOMY  04/24/2012   Procedure: HYSTERECTOMY ABDOMINAL;  Surgeon: Terrance Mass, MD;  Location: [facility];  Service: Gynecology;;  . ANAL SPHINCTEROTOMY  05-08-2019    @[facility]  . APPENDECTOMY  1997   ruptured  . CERVICAL CERCLAGE  11-06-2008;  10-07-2010  @[facility]   . CESAREAN SECTION W/BTL Bilateral 12-24-2010   @[facility]    bilateral tubal ligation with filshie clips  . COLONOSCOPY  last one 05-08-2019  @[facility]  anal fissure, polyps (TA, HP), rpt 7 yrs ([facility] @ [facility])  . ESOPHAGOGASTRODUODENOSCOPY  10/2014   WNL  . EXCISION OF ENDOMETRIOMA N/A 08/20/2016   Procedure: EXCISION OF ENDOMETRIOMA LEFT LOWER QUADRANT;  Surgeon: Georganna Skeans, MD;  Location: [facility];  Service: General;  Laterality: N/A;  . EXPLORATORY LAPAROTOMY  03-03-2004    @[facility]    WITH LYSIS ADHESIONS  . FLEXIBLE SIGMOIDOSCOPY  03/12/2019    @[facility]   s/p botox injection for anal fissure ([facility] @ [facility])  . LAPAROSCOPIC CHOLECYSTECTOMY  09/1995  . LAPAROSCOPIC HYSTERECTOMY  04/24/2012   Procedure: HYSTERECTOMY TOTAL LAPAROSCOPIC;  Surgeon: Terrance Mass, MD;  Location: [facility];   Service: Gynecology;  Laterality: N/A;  2 1/2 hours OR time.  Dr. Phineas Real to assisting   . LAPAROSCOPIC LYSIS INTESTINAL ADHESIONS    . LAPAROSCOPIC SALPINGO OOPHERECTOMY Bilateral 11/11/2019   diagnostic laparoscopy unsuccessful - extensive adhesions, unable to visualize adnexa (Anyanwu, Ugonna A, MD)    OB History    Gravida  5   Para  1   Term      Preterm  1   AB  3   Living  1     SAB  3   IAB      Ectopic      Multiple      Live Births               Home Medications    Prior to Admission medications   Medication Sig Start Date End Date Taking? Authorizing Provider  acetaminophen (TYLENOL) 500 MG tablet Take 1,000 mg by mouth as needed for moderate pain.    Yes [provider]  aspirin 81 MG tablet Take 1 tablet (81 mg total) by mouth daily. 01/11/17  Yes Ria Bush, MD  benzonatate (TESSALON) 100 MG capsule Take 1 capsule (100 mg total) by mouth every 8 (eight) hours. 10/01/20  Yes Mildred Tuccillo M, PA-C  Calcium Citrate-Vitamin D (CALCIUM + D PO) Take 1 tablet by mouth daily.    Yes [provider]  fluticasone (FLONASE) 50 MCG/ACT nasal spray Place 2 sprays into both nostrils daily. 10/01/20  Yes Talene Glastetter, Holli Humbles, PA-C  levothyroxine (SYNTHROID) 175 MCG tablet TAKE 1 TABLET BY MOUTH EVERY MORNING 07/07/20  Yes Ria Bush, MD  lisinopril-hydrochlorothiazide (ZESTORETIC) 20-25 MG tablet Take 1 tablet by mouth once daily 08/08/20  Yes Ria Bush, MD  metFORMIN (GLUCOPHAGE) 500 MG tablet Take 1 tablet (500 mg total) by mouth daily with breakfast. Patient taking differently: Take 500 mg by mouth daily with breakfast. 03/16/19  Yes Ria Bush, MD  montelukast (SINGULAIR) 10 MG tablet Take 1 tablet (10 mg total) by mouth at bedtime. 03/16/19  Yes Ria Bush, MD  vitamin E 180 MG (400 UNITS) capsule Take 400 Units by mouth daily.   Yes [provider]    Family History Family History  Problem Relation  Age of Onset  . Hypertension Father   . Stroke Father        hemorrhagic, deceased  . Diabetes Mother   . Uterine cancer Mother   . Coronary artery disease Neg Hx   . Cancer Neg Hx     Social History Social History   Tobacco Use  . Smoking status: Never Smoker  . Smokeless tobacco: Never Used  Vaping Use  . Vaping Use: Never used  Substance Use Topics  . Alcohol use: No  . Drug use: No     Allergies  Voltaren [diclofenac sodium]   Review of Systems Review of Systems  As stated above in HPI  Physical Exam Triage Vital Signs ED Triage Vitals  Enc Vitals Group     BP 10/01/20 1629 (!) 156/94     Pulse Rate 10/01/20 1629 (!) 112     Resp 10/01/20 1629 19     Temp 10/01/20 1629 99.1 F (37.3 C)     Temp Source 10/01/20 1629 Oral     SpO2 10/01/20 1629 99 %     Weight --      Height --      Head Circumference --      Peak Flow --      Pain Score 10/01/20 1631 10     Pain Loc --      Pain Edu? --      Excl. in [place]? --    No data found.  Updated Vital Signs BP (!) 156/94 (BP Location: Right Arm)   Pulse (!) 112   Temp 99.1 F (37.3 C) (Oral)   Resp 19   LMP 11/12/2011   SpO2 99%   Physical Exam Vitals and nursing note reviewed.  Constitutional:      General: She is not in acute distress.    Appearance: She is not ill-appearing, toxic-appearing or diaphoretic.  HENT:     Head: Normocephalic.     Right Ear: Tympanic membrane normal. No drainage, swelling or tenderness. No middle ear effusion. Tympanic membrane is not erythematous.     Left Ear: No drainage, swelling or tenderness.  No middle ear effusion. Tympanic membrane is not erythematous.     Nose: Congestion present. No rhinorrhea.     Mouth/Throat:     Mouth: Mucous membranes are moist.     Pharynx: No pharyngeal swelling, oropharyngeal exudate, posterior oropharyngeal erythema or uvula swelling.     Tonsils: No tonsillar exudate or tonsillar abscesses.  Eyes:     Conjunctiva/sclera:  Conjunctivae normal.  Cardiovascular:     Rate and Rhythm: Normal rate and regular rhythm.     Heart sounds: Normal heart sounds.  Pulmonary:     Effort: Pulmonary effort is normal.     Breath sounds: Normal breath sounds.  Musculoskeletal:     Cervical back: Normal range of motion.     UC Treatments / Results  Labs (all labs ordered are listed, but only abnormal results are displayed) Labs Reviewed  SARS CORONAVIRUS 2 (TAT 6-24 HRS)    Radiology No results found.  Procedures Procedures (including critical care time)  Medications Ordered in UC Medications - No data to display  Initial Impression / Assessment and Plan / UC Course  I have reviewed the triage vital signs and the nursing notes.  Pertinent labs & imaging results that were available during my care of the patient were reviewed by me and considered in my medical decision making (see chart for details).     New. COVID-19 test pending. I discussed with patient that given her symptoms of having a fever along with cold symptoms and loss of taste or smell she likely does have COVID-19. I recommended self quarantine which we discussed. I discussed oxygen monitoring with patient. I am going to send her in [place] and Flonase to help with her symptoms. We discussed that typically the sore throat lasts for about 7 days for people with COVID-19 and should resolve on its own. Discussed the importance of rest, hydration and healthy balanced diet. We discussed red flag symptoms that  would warrant further work-up.  Final Clinical Impressions(s) / UC Diagnoses   Final diagnoses:  URI with cough and congestion   Discharge Instructions   None    ED Prescriptions    Medication Sig Dispense Auth. Provider   benzonatate (TESSALON) 100 MG capsule Take 1 capsule (100 mg total) by mouth every 8 (eight) hours. 21 capsule Shandrea Lusk M, PA-C   fluticasone [facility]) 50 MCG/ACT nasal spray Place 2 sprays into both nostrils daily.  16 mL Hughie Closs, [place]     PDMP not reviewed this encounter.   Hughie Closs, [place] 10/01/20 1712

## 2020-10-01 NOTE — ED Triage Notes (Signed)
Patient complains of sore throat and left ear pain x 5 days. Pt also states she has some moderate nasal congestion and drainage. Pt is aox4 and ambulatory.

## 2020-10-03 ENCOUNTER — Other Ambulatory Visit: Payer: Self-pay | Admitting: Family Medicine

## 2020-10-03 ENCOUNTER — Telehealth: Payer: Self-pay | Admitting: Family Medicine

## 2020-10-03 ENCOUNTER — Other Ambulatory Visit (INDEPENDENT_AMBULATORY_CARE_PROVIDER_SITE_OTHER): Payer: Self-pay

## 2020-10-03 DIAGNOSIS — R059 Cough, unspecified: Secondary | ICD-10-CM

## 2020-10-03 NOTE — Telephone Encounter (Signed)
Pt scheduled for MyChart visit on 10/04/20 at 12:30.

## 2020-10-03 NOTE — Telephone Encounter (Signed)
Patient is still really sick. She went to the ED. Can we fit her in to do a mychart video visit tom 10/04/20? Please advise. EM

## 2020-10-04 ENCOUNTER — Other Ambulatory Visit: Payer: Self-pay

## 2020-10-04 ENCOUNTER — Other Ambulatory Visit: Payer: Self-pay | Admitting: Family Medicine

## 2020-10-04 ENCOUNTER — Telehealth (INDEPENDENT_AMBULATORY_CARE_PROVIDER_SITE_OTHER): Payer: Self-pay | Admitting: Family Medicine

## 2020-10-04 ENCOUNTER — Encounter: Payer: Self-pay | Admitting: Family Medicine

## 2020-10-04 VITALS — Temp 103.0°F | Ht 62.0 in | Wt 155.0 lb

## 2020-10-04 DIAGNOSIS — J4520 Mild intermittent asthma, uncomplicated: Secondary | ICD-10-CM

## 2020-10-04 DIAGNOSIS — E118 Type 2 diabetes mellitus with unspecified complications: Secondary | ICD-10-CM

## 2020-10-04 DIAGNOSIS — J22 Unspecified acute lower respiratory infection: Secondary | ICD-10-CM

## 2020-10-04 DIAGNOSIS — Z20822 Contact with and (suspected) exposure to covid-19: Secondary | ICD-10-CM

## 2020-10-04 DIAGNOSIS — R509 Fever, unspecified: Secondary | ICD-10-CM

## 2020-10-04 DIAGNOSIS — Z8673 Personal history of transient ischemic attack (TIA), and cerebral infarction without residual deficits: Secondary | ICD-10-CM

## 2020-10-04 DIAGNOSIS — I10 Essential (primary) hypertension: Secondary | ICD-10-CM

## 2020-10-04 MED ORDER — ALBUTEROL SULFATE HFA 108 (90 BASE) MCG/ACT IN AERS
2.0000 | INHALATION_SPRAY | Freq: Four times a day (QID) | RESPIRATORY_TRACT | 3 refills | Status: DC | PRN
Start: 2020-10-04 — End: 2020-10-04

## 2020-10-04 MED ORDER — MONTELUKAST SODIUM 10 MG PO TABS: 10.0000 mg | ORAL_TABLET | Freq: Every day | ORAL | 1 refills | Status: DC

## 2020-10-04 NOTE — Assessment & Plan Note (Addendum)
6d respiratory symptoms of fever, congestion, ST and cough along with malaise and fatigue in setting of COVID positive daughter. Anticipate COVID infection, test from yesterday afternoon pending. Given comorbidities, will refer to [place] outpatient treatment team to see if pt eligible (anticipate test will return positive).  Red flags to seek in-person care reviewed.  Reviewed supportive measures at home including vit D,C,zinc fluids and rest.  Some wheezing in asthmatic - rx albuterol inhaler PRN as well as refill singulair. Discussed low threshold to start steroid taper, however will defer at this time in diabetic.

## 2020-10-04 NOTE — Progress Notes (Signed)
Patient ID: [name], female    DOB: 1971/02/26, 50 y.o.   MRN: [id number]  Virtual visit completed through [place], a video enabled telemedicine application. Due to national recommendations of social distancing due to COVID-19, a virtual visit is felt to be most appropriate for this patient at this time. Reviewed limitations, risks, security and privacy concerns of performing a virtual visit and the availability of in person appointments. I also reviewed that there may be a patient responsible charge related to this service. The patient agreed to proceed.   Patient location: home Provider location: [place] at [facility], office Persons participating in this virtual visit: patient, provider   If any vitals were documented, they were collected by patient at home unless specified below.    Temp (!) 103 F (39.4 C) Comment: 6:30 this morning  Ht 5\' 2"  (1.575 m)   Wt 155 lb (70.3 kg)   LMP 11/12/2011   BMI 28.35 kg/m   BP Readings from Last 3 Encounters:  10/01/20 (!) 156/94  02/08/20 (!) 149/98  12/02/19 135/82  Repeat current temp 97.6 CC: cough, congestion, close COVID exposure Subjective:   HPI: [name] is a 50 y.o. female presenting on 10/04/2020 for Cough (C/o prod cough with green mucous, chest congestion, fever- max 103 and occasional SOB.  Sxs started 09/28/20.   Taking Tylenol.)   Last seen here 03/2019.   6d h/o hoarseness, progressed to malaise, fatigue, fever, L earache, productive cough with congestion, ST with sensation of throat swelling. Symptoms worsen at night time. Notes some wheezing without dyspnea. No nausea/vomiting or abd pain, diarrhea.   Seen at [facility] over the weekend with 5d h/o fever Tmax 103, ST, earache, congestion, drainage. She doesn't have loss of taste/smell. Note reviewed. It seems plan was covid test but this was not done. Treated with flonase, tessalon perls. O2 sat 99% at that time.   COVID tested yesterday through our office - results pending.  Taking tylenol 1000mg  Q4 hours - not controlling fever.  She's also taking singulair with benefit. She doesn't have albuterol.   She is COVID vaccinated with [place] 12/2019, 01/2020, not boosted 10yo daughter recently tested positive for COVID but she is largely asymptomatic.   H/o uncontrolled hypothyroidism.  Risk factors include h/o CVA, HTN, T2DM, asthma.  Lab Results  Component Value Date   HGBA1C 6.1 03/16/2019  Ongoing pelvic pain s/p failed laparoscopic lysis of adhesions - planning to see [facility] surgeon for further evaluation (appt has been postponed multiple times due to pandemic).      Relevant past medical, surgical, family and social history reviewed and updated as indicated. Interim medical history since our last visit reviewed. Allergies and medications reviewed and updated. Outpatient Medications Prior to Visit  Medication Sig Dispense Refill  . acetaminophen (TYLENOL) 500 MG tablet Take 1,000 mg by mouth as needed for moderate pain.     Marland Kitchen aspirin 81 MG tablet Take 1 tablet (81 mg total) by mouth daily.    . benzonatate (TESSALON) 100 MG capsule Take 1 capsule (100 mg total) by mouth every 8 (eight) hours. 21 capsule 0  . Calcium Citrate-Vitamin D (CALCIUM + D PO) Take 1 tablet by mouth daily.     . fluticasone (FLONASE) 50 MCG/ACT nasal spray Place 2 sprays into both nostrils daily. 16 mL 0  . levothyroxine (SYNTHROID) 175 MCG tablet TAKE 1 TABLET BY MOUTH EVERY MORNING 90 tablet 0  . lisinopril-hydrochlorothiazide (ZESTORETIC)  20-25 MG tablet Take 1 tablet by mouth once daily 90 tablet 0  . metFORMIN (GLUCOPHAGE) 500 MG tablet Take 1 tablet (500 mg total) by mouth daily with breakfast. (Patient taking differently: Take 500 mg by mouth daily with breakfast.) 90 tablet 1  . vitamin E 180 MG (400 UNITS) capsule Take 400 Units by mouth daily.    . montelukast (SINGULAIR) 10 MG tablet Take 1 tablet (10 mg total)  by mouth at bedtime. 90 tablet 3   No facility-administered medications prior to visit.     Per HPI unless specifically indicated in ROS section below Review of Systems Objective:  Temp (!) 103 F (39.4 C) Comment: 6:30 this morning  Ht 5\' 2"  (1.575 m)   Wt 155 lb (70.3 kg)   LMP 11/12/2011   BMI 28.35 kg/m   Wt Readings from Last 3 Encounters:  10/04/20 155 lb (70.3 kg)  12/02/19 167 lb 1.6 oz (75.8 kg)  11/13/19 172 lb (78 kg)       Physical exam: Gen: alert, NAD, tired but non toxic appearing Pulm: speaks in complete sentences without increased work of breathing, cough present Psych: normal mood, normal thought content      Results for orders placed or performed in visit on 11/19/19  T4, free  Result Value Ref Range   Free T4 1.42 0.60 - 1.60 ng/dL  TSH  Result Value Ref Range   TSH 0.17 (L) 0.35 - 4.50 uIU/mL   Assessment & Plan:  Advised to return when feeling better for labs and CPE as overdue.  Problem List Items Addressed This Visit    HYPERTENSION, BENIGN SYSTEMIC   History of ischemic stroke   Controlled diabetes mellitus type 2 with complications (HCC)   Asthma   Relevant Medications   montelukast (SINGULAIR) 10 MG tablet   albuterol (VENTOLIN HFA) 108 (90 Base) MCG/ACT inhaler   Acute respiratory infection - Primary    6d respiratory symptoms of fever, congestion, ST and cough along with malaise and fatigue in setting of COVID positive daughter. Anticipate COVID infection, test from yesterday afternoon pending. Given comorbidities, will refer to [place] outpatient treatment team to see if pt eligible (anticipate test will return positive).  Red flags to seek in-person care reviewed.  Reviewed supportive measures at home including vit D,C,zinc fluids and rest.  Some wheezing in asthmatic - rx albuterol inhaler PRN as well as refill singulair. Discussed low threshold to start steroid taper, however will defer at this time in diabetic.       Relevant Orders    Ambulatory referral for Covid Treatment    Other Visit Diagnoses    Fever, unspecified fever cause       Relevant Orders   Ambulatory referral for Covid Treatment   Close exposure to COVID-19 virus       Relevant Orders   Ambulatory referral for Covid Treatment       Meds ordered this encounter  Medications  . montelukast (SINGULAIR) 10 MG tablet    Sig: Take 1 tablet (10 mg total) by mouth at bedtime.    Dispense:  90 tablet    Refill:  1  . albuterol (VENTOLIN HFA) 108 (90 Base) MCG/ACT inhaler    Sig: Inhale 2 puffs into the lungs every 6 (six) hours as needed for wheezing or shortness of breath.    Dispense:  8 g    Refill:  3   Orders Placed This Encounter  Procedures  . Ambulatory referral for Covid  Treatment    Referral Priority:   Routine    Referral Type:   Auth/Cert    Referral Reason:   Specialty Services Required    Number of Visits Requested:   1    I discussed the assessment and treatment plan with the patient. The patient was provided an opportunity to ask questions and all were answered. The patient agreed with the plan and demonstrated an understanding of the instructions. The patient was advised to call back or seek an in-person evaluation if the symptoms worsen or if the condition fails to improve as anticipated.  Follow up plan: Return if symptoms worsen or fail to improve.  Ria Bush, MD

## 2020-10-05 LAB — NOVEL CORONAVIRUS, NAA: SARS-CoV-2, NAA: DETECTED — AB

## 2020-10-05 LAB — SARS-COV-2, NAA 2 DAY TAT

## 2020-10-07 ENCOUNTER — Telehealth: Payer: Self-pay | Admitting: Family Medicine

## 2020-10-07 NOTE — Telephone Encounter (Signed)
Spoke with pt asking for an update.  States she feels better.  Still c/o cough but not as bad and has some fatigue.  She says the inhaler and the Singulair are helping.  I relayed Dr. Synthia Innocent message.  Pt verbalizes understanding and expresses her thanks.

## 2020-10-07 NOTE — Telephone Encounter (Signed)
Patient never read [employer].  Please call - COVID test positive. How are symptoms, how is she doing with albuterol inhaler and singulair refills?  If worsening shortness of breath or fever, rec in person eval over weekend.

## 2020-11-25 ENCOUNTER — Ambulatory Visit (HOSPITAL_COMMUNITY)
Admission: EM | Admit: 2020-11-25 | Discharge: 2020-11-25 | Disposition: A | Discharge status: Home / Self Care | Payer: Self-pay | Attending: Student | Admitting: Student

## 2020-11-25 ENCOUNTER — Other Ambulatory Visit: Payer: Self-pay

## 2020-11-25 ENCOUNTER — Encounter (HOSPITAL_COMMUNITY): Payer: Self-pay | Admitting: *Deleted

## 2020-11-25 ENCOUNTER — Telehealth: Payer: Self-pay | Admitting: Family Medicine

## 2020-11-25 DIAGNOSIS — A084 Viral intestinal infection, unspecified: Secondary | ICD-10-CM

## 2020-11-25 MED ORDER — ONDANSETRON 8 MG PO TBDP: 8.0000 mg | ORAL_TABLET | Freq: Three times a day (TID) | ORAL | 0 refills | Status: DC | PRN

## 2020-11-25 MED ORDER — LOPERAMIDE HCL 2 MG PO CAPS: 2.0000 mg | ORAL_CAPSULE | Freq: Four times a day (QID) | ORAL | 0 refills | Status: DC | PRN

## 2020-11-25 NOTE — Telephone Encounter (Signed)
I spoke with pt; pt said starting on 11/24/20 pt has had upper and lower sharpe abd pain that comes and goes; N&V&D; pt also having fever and chills. pts daughter had similar symptoms on 11/21/20 and she went to hospital for eval. Pt said her daughter was neg for covid. Pt said she is having a lot of abd pain now and is going to [facility] on [address]. Sending note to Dr Darnell Level as Juluis Rainier.

## 2020-11-25 NOTE — Telephone Encounter (Addendum)
Noted. Will await [facility] evaluation.  plz call Monday for update on symptoms.

## 2020-11-25 NOTE — ED Provider Notes (Signed)
[place]    CSN: [id number] Arrival date & time: 11/25/20  1213      History   Chief Complaint Chief Complaint  Patient presents with  . Abdominal Pain  . Emesis  . Diarrhea    HPI [name] is a 50 y.o. female presenting with nausea, vomiting, diarrhea x1 day following exposure to viral gastroenteritis. Endorses 10 episodes of bilious vomiting, and about 10 episodes of watery diarrhea. Generalized crampy abdominal pain. Denies fevers/chills, n/v/d, shortness of breath, chest pain, cough, congestion, facial pain, teeth pain, headaches, sore throat, loss of taste/smell, swollen lymph nodes, ear pain.    HPI  History reviewed. No pertinent past medical history.  There are no problems to display for this patient.   History reviewed. No pertinent surgical history.  OB History   No obstetric history on file.      Home Medications    Prior to Admission medications   Medication Sig Start Date End Date Taking? Authorizing Provider  loperamide (IMODIUM) 2 MG capsule Take 1 capsule (2 mg total) by mouth 4 (four) times daily as needed for diarrhea or loose stools. 11/25/20  Yes Hazel Sams, PA-C  ondansetron (ZOFRAN ODT) 8 MG disintegrating tablet Take 1 tablet (8 mg total) by mouth every 8 (eight) hours as needed for nausea or vomiting. 11/25/20  Yes Hazel Sams, PA-C    Family History History reviewed. No pertinent family history.  Social History Social History   Tobacco Use  . Smoking status: Never Smoker  . Smokeless tobacco: Never Used     Allergies   Patient has no known allergies.   Review of Systems Review of Systems  Constitutional: Negative for appetite change, chills, diaphoresis, fever and unexpected weight change.  HENT: Negative for congestion, ear pain, sinus pressure, sinus pain, sneezing, sore throat and trouble swallowing.   Respiratory: Negative for cough, chest tightness and shortness of breath.    Cardiovascular: Negative for chest pain.  Gastrointestinal: Positive for diarrhea, nausea and vomiting. Negative for abdominal distention, abdominal pain, anal bleeding, blood in stool, constipation and rectal pain.  Genitourinary: Negative for dysuria, flank pain, frequency and urgency.  Musculoskeletal: Negative for back pain and myalgias.  Neurological: Negative for dizziness, light-headedness and headaches.  All other systems reviewed and are negative.    Physical Exam Triage Vital Signs ED Triage Vitals [11/25/20 1246]  Enc Vitals Group     BP 133/78     Pulse Rate 100     Resp 20     Temp 98.8 F (37.1 C)     Temp Source Oral     SpO2 96 %     Weight      Height      Head Circumference      Peak Flow      Pain Score      Pain Loc      Pain Edu?      Excl. in [place]?    No data found.  Updated Vital Signs BP 133/78 (BP Location: Right Arm)   Pulse 100   Temp 98.8 F (37.1 C) (Oral)   Resp 20   SpO2 96%   Visual Acuity Right Eye Distance:   Left Eye Distance:   Bilateral Distance:    Right Eye Near:   Left Eye Near:    Bilateral Near:     Physical Exam Vitals reviewed.  Constitutional:      General: She is not in acute distress.  Appearance: Normal appearance. She is not ill-appearing.  HENT:     Head: Normocephalic and atraumatic.     Mouth/Throat:     Mouth: Mucous membranes are moist.     Comments: Moist mucous membranes Eyes:     Extraocular Movements: Extraocular movements intact.     Pupils: Pupils are equal, round, and reactive to light.  Cardiovascular:     Rate and Rhythm: Normal rate and regular rhythm.     Heart sounds: Normal heart sounds.  Pulmonary:     Effort: Pulmonary effort is normal.     Breath sounds: Normal breath sounds. No wheezing, rhonchi or rales.  Abdominal:     General: Bowel sounds are normal. There is no distension.     Palpations: Abdomen is soft. There is no mass.     Tenderness: There is generalized abdominal  tenderness. There is no right CVA tenderness, left CVA tenderness, guarding or rebound. Negative signs include Murphy's sign, Rovsing's sign and McBurney's sign.  Skin:    General: Skin is warm.     Capillary Refill: Capillary refill takes less than 2 seconds.     Comments: Good skin turgor  Neurological:     General: No focal deficit present.     Mental Status: She is alert and oriented to person, place, and time.  Psychiatric:        Mood and Affect: Mood normal.        Behavior: Behavior normal.      UC Treatments / Results  Labs (all labs ordered are listed, but only abnormal results are displayed) Labs Reviewed - No data to display  EKG   Radiology No results found.  Procedures Procedures (including critical care time)  Medications Ordered in UC Medications - No data to display  Initial Impression / Assessment and Plan / UC Course  I have reviewed the triage vital signs and the nursing notes.  Pertinent labs & imaging results that were available during my care of the patient were reviewed by me and considered in my medical decision making (see chart for details).     This patient is a 50 year old female presenting with viral gastroenteritis. Today this pt is afebrile nontachycardic nontachypneic, oxygenating well on room air, no wheezes rhonchi or rales. Appears well hydrated.   States she could not be pregnant.  zofran and imodium sent. Rec good hydration, BRAT diet.  Return precautions discussed.  This chart was dictated using voice recognition software, Dragon. Despite the best efforts of this provider to proofread and correct errors, errors may still occur which can change documentation meaning.  Final Clinical Impressions(s) / UC Diagnoses   Final diagnoses:  Viral gastroenteritis     Discharge Instructions     -Start the nausea medication- Zofran ODT (ondansetron), up to 3x daily. Dissolve this under your tongue.  -You can also take the diarrhea  medication- Imodium, up to 4x daily.  -Make sure to drink plenty of fluids and eat a bland diet as tolerated. -If you are still unable to keep fluids down despite treatment, or if you develop new symptoms like severe right-sided abdominal pain, dizziness, shortness of breath, chest pain- head straight to the ED or call 911.     ED Prescriptions    Medication Sig Dispense Auth. Provider   ondansetron (ZOFRAN ODT) 8 MG disintegrating tablet Take 1 tablet (8 mg total) by mouth every 8 (eight) hours as needed for nausea or vomiting. 20 tablet Hazel Sams, PA-C   loperamide (  IMODIUM) 2 MG capsule Take 1 capsule (2 mg total) by mouth 4 (four) times daily as needed for diarrhea or loose stools. 21 capsule Hazel Sams, PA-C     PDMP not reviewed this encounter.   Hazel Sams, PA-C 11/25/20 1339

## 2020-11-25 NOTE — Discharge Instructions (Addendum)
-  Start the nausea medication- Zofran ODT (ondansetron), up to 3x daily. Dissolve this under your tongue.  -You can also take the diarrhea medication- Imodium, up to 4x daily.  -Make sure to drink plenty of fluids and eat a bland diet as tolerated. -If you are still unable to keep fluids down despite treatment, or if you develop new symptoms like severe right-sided abdominal pain, dizziness, shortness of breath, chest pain- head straight to the ED or call 911.

## 2020-11-25 NOTE — Telephone Encounter (Signed)
Returned pt's call.  She c/o nausea, vomiting, diarrhea and abd pain.  Sxs started yesterday.  Mentioned that her daughter had same sxs, which started 11/21/20 and she was taken to the hospital.   Fwd to [facility] to triage.

## 2020-11-25 NOTE — Telephone Encounter (Signed)
Patient requesting a call back. EM

## 2020-11-27 ENCOUNTER — Other Ambulatory Visit: Payer: Self-pay | Admitting: Family Medicine

## 2020-11-28 ENCOUNTER — Other Ambulatory Visit: Payer: Self-pay | Admitting: Family Medicine

## 2020-11-28 ENCOUNTER — Encounter: Payer: Self-pay | Admitting: Family Medicine

## 2020-11-28 NOTE — Telephone Encounter (Signed)
Pt has canceled or no showed multiple appointments in the last 1.5 years. Did an acute virtual visit 10/2020 for illness. Will forward to Dr Danise Mina to decide about refills.

## 2020-11-28 NOTE — Telephone Encounter (Signed)
Spoke to patient and she gave the phone to her husband. Was advised that his wife went to the UC next to [facility] as instructed. Patient's husband stated that his wife was given medication and is doing much better today. Advised Mr. Nouri to call the office back if his wife does not continue to improve.

## 2020-11-28 NOTE — Telephone Encounter (Signed)
Pt has canceled or no showed multiple appointments in the last 1.5 years. Did an acute virtual visit 10/2020 for illness. Will forward to Dr Danise Mina to decide about refills. Last TSH was 11-19-19

## 2020-11-28 NOTE — Telephone Encounter (Signed)
Thank you :)

## 2020-12-02 NOTE — Telephone Encounter (Signed)
ERx 

## 2020-12-02 NOTE — Telephone Encounter (Signed)
Pt called and wanted to speak to lisa

## 2020-12-02 NOTE — Telephone Encounter (Addendum)
E-scribed refill.    Spoke with pt scheduling lab visit on 12/05/20 at 9:05 and CPE on 12/07/20 at 9:00.

## 2020-12-05 ENCOUNTER — Other Ambulatory Visit (INDEPENDENT_AMBULATORY_CARE_PROVIDER_SITE_OTHER): Payer: Self-pay

## 2020-12-05 ENCOUNTER — Other Ambulatory Visit: Payer: Self-pay

## 2020-12-05 DIAGNOSIS — E118 Type 2 diabetes mellitus with unspecified complications: Secondary | ICD-10-CM

## 2020-12-05 DIAGNOSIS — E039 Hypothyroidism, unspecified: Secondary | ICD-10-CM

## 2020-12-05 DIAGNOSIS — Z1159 Encounter for screening for other viral diseases: Secondary | ICD-10-CM

## 2020-12-05 DIAGNOSIS — E785 Hyperlipidemia, unspecified: Secondary | ICD-10-CM

## 2020-12-05 DIAGNOSIS — D649 Anemia, unspecified: Secondary | ICD-10-CM

## 2020-12-05 LAB — CBC WITH DIFFERENTIAL/PLATELET
Basophils Absolute: 0 10*3/uL (ref 0.0–0.1)
Basophils Relative: 0.5 % (ref 0.0–3.0)
Eosinophils Absolute: 0.3 10*3/uL (ref 0.0–0.7)
Eosinophils Relative: 3.2 % (ref 0.0–5.0)
HCT: 43.1 % (ref 36.0–46.0)
Hemoglobin: 14.2 g/dL (ref 12.0–15.0)
Lymphocytes Relative: 31.8 % (ref 12.0–46.0)
Lymphs Abs: 2.6 10*3/uL (ref 0.7–4.0)
MCHC: 32.9 g/dL (ref 30.0–36.0)
MCV: 89.4 fl (ref 78.0–100.0)
Monocytes Absolute: 0.8 10*3/uL (ref 0.1–1.0)
Monocytes Relative: 9.4 % (ref 3.0–12.0)
Neutro Abs: 4.5 10*3/uL (ref 1.4–7.7)
Neutrophils Relative %: 55.1 % (ref 43.0–77.0)
Platelets: 159 10*3/uL (ref 150.0–400.0)
RBC: 4.82 Mil/uL (ref 3.87–5.11)
RDW: 13.1 % (ref 11.5–15.5)
WBC: 8.2 10*3/uL (ref 4.0–10.5)

## 2020-12-05 LAB — COMPREHENSIVE METABOLIC PANEL
ALT: 39 U/L — ABNORMAL HIGH (ref 0–35)
AST: 18 U/L (ref 0–37)
Albumin: 4.2 g/dL (ref 3.5–5.2)
Alkaline Phosphatase: 102 U/L (ref 39–117)
BUN: 20 mg/dL (ref 6–23)
CO2: 31 mEq/L (ref 19–32)
Calcium: 9.7 mg/dL (ref 8.4–10.5)
Chloride: 99 mEq/L (ref 96–112)
Creatinine, Ser: 0.84 mg/dL (ref 0.40–1.20)
GFR: 81.21 mL/min (ref >60.00)
Glucose, Bld: 146 mg/dL — ABNORMAL HIGH (ref 70–99)
Potassium: 4.6 mEq/L (ref 3.5–5.1)
Sodium: 137 mEq/L (ref 135–145)
Total Bilirubin: 0.3 mg/dL (ref 0.2–1.2)
Total Protein: 7.5 g/dL (ref 6.0–8.3)

## 2020-12-05 LAB — HEMOGLOBIN A1C: Hgb A1c MFr Bld: 7.1 % — ABNORMAL HIGH (ref 4.6–6.5)

## 2020-12-05 LAB — LIPID PANEL
Cholesterol: 211 mg/dL — ABNORMAL HIGH (ref 0–200)
HDL: 31.6 mg/dL — ABNORMAL LOW (ref >39.00)
Total CHOL/HDL Ratio: 7
Triglycerides: 486 mg/dL — ABNORMAL HIGH (ref 0.0–149.0)

## 2020-12-05 LAB — TSH: TSH: 1.95 u[IU]/mL (ref 0.35–4.50)

## 2020-12-05 LAB — T4, FREE: Free T4: 1.03 ng/dL (ref 0.60–1.60)

## 2020-12-05 LAB — LDL CHOLESTEROL, DIRECT: Direct LDL: 107 mg/dL

## 2020-12-06 LAB — HEPATITIS C ANTIBODY
Hepatitis C Ab: NONREACTIVE
SIGNAL TO CUT-OFF: 0.02 (ref <1.00)

## 2020-12-07 ENCOUNTER — Encounter: Payer: Self-pay | Admitting: Family Medicine

## 2020-12-07 ENCOUNTER — Other Ambulatory Visit (HOSPITAL_COMMUNITY): Payer: Self-pay

## 2020-12-07 ENCOUNTER — Ambulatory Visit: Payer: Self-pay | Admitting: Family Medicine

## 2020-12-07 ENCOUNTER — Other Ambulatory Visit: Payer: Self-pay

## 2020-12-07 VITALS — BP 118/82 | HR 88 | Temp 98.1°F | Ht 62.25 in | Wt 167.5 lb

## 2020-12-07 DIAGNOSIS — J4520 Mild intermittent asthma, uncomplicated: Secondary | ICD-10-CM

## 2020-12-07 DIAGNOSIS — R102 Pelvic and perineal pain: Secondary | ICD-10-CM

## 2020-12-07 DIAGNOSIS — I10 Essential (primary) hypertension: Secondary | ICD-10-CM

## 2020-12-07 DIAGNOSIS — E039 Hypothyroidism, unspecified: Secondary | ICD-10-CM

## 2020-12-07 DIAGNOSIS — Z8673 Personal history of transient ischemic attack (TIA), and cerebral infarction without residual deficits: Secondary | ICD-10-CM

## 2020-12-07 DIAGNOSIS — E118 Type 2 diabetes mellitus with unspecified complications: Secondary | ICD-10-CM

## 2020-12-07 DIAGNOSIS — Z0001 Encounter for general adult medical examination with abnormal findings: Secondary | ICD-10-CM

## 2020-12-07 DIAGNOSIS — E785 Hyperlipidemia, unspecified: Secondary | ICD-10-CM

## 2020-12-07 DIAGNOSIS — N809 Endometriosis, unspecified: Secondary | ICD-10-CM

## 2020-12-07 DIAGNOSIS — G8929 Other chronic pain: Secondary | ICD-10-CM

## 2020-12-07 MED ORDER — ATORVASTATIN CALCIUM 40 MG PO TABS
40.0000 mg | ORAL_TABLET | Freq: Every day | ORAL | 3 refills | Status: DC
  Filled 2020-12-07 – 2021-01-18 (×3): qty 90, 90d supply, fill #0

## 2020-12-07 MED ORDER — LISINOPRIL-HYDROCHLOROTHIAZIDE 20-25 MG PO TABS
1.0000 | ORAL_TABLET | Freq: Every day | ORAL | 3 refills | Status: DC
Start: 2020-12-07 — End: 2021-03-30
  Filled 2020-12-07 – 2021-01-18 (×3): qty 90, 90d supply, fill #0

## 2020-12-07 MED ORDER — METFORMIN HCL ER 500 MG PO TB24
500.0000 mg | ORAL_TABLET | Freq: Every day | ORAL | 3 refills | Status: DC
  Filled 2020-12-07 – 2021-05-18 (×4): qty 90, 90d supply, fill #0

## 2020-12-07 MED ORDER — LEVOTHYROXINE SODIUM 175 MCG PO TABS
175.0000 ug | ORAL_TABLET | Freq: Every morning | ORAL | 3 refills | Status: DC
  Filled 2020-12-07: qty 5, 5d supply, fill #0
  Filled 2020-12-07: qty 85, 85d supply, fill #0
  Filled 2021-01-03 – 2021-01-18 (×2): qty 90, 90d supply, fill #0
  Filled 2021-05-17: qty 90, 90d supply, fill #1
  Filled 2021-08-29 – 2021-09-16 (×2): qty 90, 90d supply, fill #2

## 2020-12-07 NOTE — Assessment & Plan Note (Signed)
Remote L globus pallidus infarct. Continue aspirin. Restart statin.

## 2020-12-07 NOTE — Assessment & Plan Note (Signed)
Chronic, stable on zestoretic - continue.

## 2020-12-07 NOTE — Assessment & Plan Note (Signed)
Chronic, stable. Continue current regimen. 

## 2020-12-07 NOTE — Assessment & Plan Note (Addendum)
Predominant hypertriglyceridemia currently off meds with trig levels 490s. In diabetic and h/o stroke, will start atorvastatin 40mg  - advised monitor for myalgias and let us know if they develop to titration dose. The ASCVD Risk score Mikey Bussing DC Jr., et al., 2013) failed to calculate for the following reasons:   The patient has a prior MI or stroke diagnosis

## 2020-12-07 NOTE — Assessment & Plan Note (Signed)
Discussed with patient

## 2020-12-07 NOTE — Assessment & Plan Note (Addendum)
Known adhesions and h/o endometriosis Has established with [facility] surgery - planning pelvic imaging then PFPT.  Needs to apply for financial assistance

## 2020-12-07 NOTE — Patient Instructions (Addendum)
Puede llamar a [facility] [phone] y preguntar sobre tarjeta de identificacion.  Comeinze atorvastatina 40mg  diarios para controlar colesterol especialmente trigliceridos. Dejenos saber si le empiezan a doler los musculos con Astronomer.  He rellenado otras medicinas.  Regresar en 6 meses para seguir diabetes  Health Maintenance, Female Adopting a healthy lifestyle and getting preventive care are important in promoting health and wellness. Ask your health care provider about:  The right schedule for you to have regular tests and exams.  Things you can do on your own to prevent diseases and keep yourself healthy. What should I know about diet, weight, and exercise? Eat a healthy diet  Eat a diet that includes plenty of vegetables, fruits, low-fat dairy products, and lean protein.  Do not eat a lot of foods that are high in solid fats, added sugars, or sodium.   Maintain a healthy weight Body mass index (BMI) is used to identify weight problems. It estimates body fat based on height and weight. Your health care provider can help determine your BMI and help you achieve or maintain a healthy weight. Get regular exercise Get regular exercise. This is one of the most important things you can do for your health. Most adults should:  Exercise for at least 150 minutes each week. The exercise should increase your heart rate and make you sweat (moderate-intensity exercise).  Do strengthening exercises at least twice a week. This is in addition to the moderate-intensity exercise.  Spend less time sitting. Even light physical activity can be beneficial. Watch cholesterol and blood lipids Have your blood tested for lipids and cholesterol at 50 years of age, then have this test every 5 years. Have your cholesterol levels checked more often if:  Your lipid or cholesterol levels are high.  You are older than 50 years of age.  You are at high risk for heart  disease. What should I know about cancer screening? Depending on your health history and family history, you may need to have cancer screening at various ages. This may include screening for:  Breast cancer.  Cervical cancer.  Colorectal cancer.  Skin cancer.  Lung cancer. What should I know about heart disease, diabetes, and high blood pressure? Blood pressure and heart disease  High blood pressure causes heart disease and increases the risk of stroke. This is more likely to develop in people who have high blood pressure readings, are of African descent, or are overweight.  Have your blood pressure checked: ? Every 3-5 years if you are 59-81 years of age. ? Every year if you are 42 years old or older. Diabetes Have regular diabetes screenings. This checks your fasting blood sugar level. Have the screening done:  Once every three years after age 18 if you are at a normal weight and have a low risk for diabetes.  More often and at a younger age if you are overweight or have a high risk for diabetes. What should I know about preventing infection? Hepatitis B If you have a higher risk for hepatitis B, you should be screened for this virus. Talk with your health care provider to find out if you are at risk for hepatitis B infection. Hepatitis C Testing is recommended for:  Everyone born from [birth year] through 1965.  Anyone with known risk factors for hepatitis C. Sexually transmitted infections (STIs)  Get screened for STIs, including gonorrhea and chlamydia, if: ? You are sexually active and are younger than 50 years of age. ?  You are older than 50 years of age and your health care provider tells you that you are at risk for this type of infection. ? Your sexual activity has changed since you were last screened, and you are at increased risk for chlamydia or gonorrhea. Ask your health care provider if you are at risk.  Ask your health care provider about whether you are at high  risk for HIV. Your health care provider may recommend a prescription medicine to help prevent HIV infection. If you choose to take medicine to prevent HIV, you should first get tested for HIV. You should then be tested every 3 months for as long as you are taking the medicine. Pregnancy  If you are about to stop having your period (premenopausal) and you may become pregnant, seek counseling before you get pregnant.  Take 400 to 800 micrograms (mcg) of folic acid every day if you become pregnant.  Ask for birth control (contraception) if you want to prevent pregnancy. Osteoporosis and menopause Osteoporosis is a disease in which the bones lose minerals and strength with aging. This can result in bone fractures. If you are 43 years old or older, or if you are at risk for osteoporosis and fractures, ask your health care provider if you should:  Be screened for bone loss.  Take a calcium or vitamin D supplement to lower your risk of fractures.  Be given hormone replacement therapy (HRT) to treat symptoms of menopause. Follow these instructions at home: Lifestyle  Do not use any products that contain nicotine or tobacco, such as cigarettes, e-cigarettes, and chewing tobacco. If you need help quitting, ask your health care provider.  Do not use street drugs.  Do not share needles.  Ask your health care provider for help if you need support or information about quitting drugs. Alcohol use  Do not drink alcohol if: ? Your health care provider tells you not to drink. ? You are pregnant, may be pregnant, or are planning to become pregnant.  If you drink alcohol: ? Limit how much you use to 0-1 drink a day. ? Limit intake if you are breastfeeding.  Be aware of how much alcohol is in your drink. In the [place] one drink equals one 12 oz bottle of beer (355 mL), one 5 oz glass of wine (148 mL), or one 1 oz glass of hard liquor (44 mL). General instructions  Schedule regular health, dental,  and eye exams.  Stay current with your vaccines.  Tell your health care provider if: ? You often feel depressed. ? You have ever been abused or do not feel safe at home. Summary  Adopting a healthy lifestyle and getting preventive care are important in promoting health and wellness.  Follow your health care provider's instructions about healthy diet, exercising, and getting tested or screened for diseases.  Follow your health care provider's instructions on monitoring your cholesterol and blood pressure. This information is not intended to replace advice given to you by your health care provider. Make sure you discuss any questions you have with your health care provider. Document Revised: 08/13/2018 Document Reviewed: 08/13/2018 Elsevier Patient Education  2021 [employer].

## 2020-12-07 NOTE — Assessment & Plan Note (Addendum)
Preventative protocols reviewed and updated unless pt declined. Discussed healthy diet and lifestyle.  Info provided for [employer] ID program.

## 2020-12-07 NOTE — Assessment & Plan Note (Signed)
Continue singulair with PRN albuterol.

## 2020-12-07 NOTE — Assessment & Plan Note (Signed)
Notes difficulty tolerating IR metformin (?blurry vision) - will trial XR metformin. RTC 6 mo DM f/u visit.

## 2020-12-07 NOTE — Progress Notes (Signed)
Patient ID: [name], female    DOB: Oct 09, 1970, 50 y.o.   MRN: [id number]  This visit was conducted in person.  BP 118/82   Pulse 88   Temp 98.1 F (36.7 C) (Temporal)   Ht 5' 2.25" (1.581 m)   Wt 167 lb 8 oz (76 kg)   LMP 11/12/2011   SpO2 96%   BMI 30.39 kg/m    CC: CPE Subjective:   HPI: [name] is a 50 y.o. female presenting on 12/07/2020 for Annual Exam   Self pay patient.  Correct name is [place] visit for abd pain, vomiting, diarrhea, thought to have viral gastroenteritis - treated with zofran and imodium. Symptoms have improved. Daughter and husband also ill at the same time. Notes some residual hip and bone pain.   Most recently saw [facility] gynecology (minimally invasive surgery division) last month for ongoing LLQ pain in setting of prior endometriosis and previous endometrioma removal from L abdominal wall. Recommended MRI abd/pelvis, PFPT and lab work when she gets insurance. Planning to apply for [facility] care through [place].   She had diagnostic laparoscopy 11/2019 for ovarian cysts and hydrosalpinx - found extensive adhesions throughout.   Tested positive for COVID 10/2020, symptoms did improve but she notes ongoing wheezing - taking singulair and albuterol inhaler with benefit. Notes increased anxiety and fatigue since COVID as well.   Preventative: COLONOSCOPY 10/2014 1 hyperplastic polyp, rpt 10 yrs [facility]) Colonoscopy 02/2019 - anal fissure, polyps (adenoma), rpt 7 yrs ([place] [facility]) Well woman - s/p hysterectomy 2013 for heavy bleeding. No vaginal bleeding.  [facility] - benign apocrine metaplasia of L breast - rpt 39yr.  Flu shot - occasionally COVID vaccine Pfizer 12/2019, 01/2020, declines booster as she had hives after 2nd dose  Td 2003, Tdap 01/2017  Shingrix - discussed, will defer for now  Seat belt use discussed  Sunscreen use  discussed. No changing moles on skin.  Non smoker  No alcohol Dentist - due  Eye exam - has not seen.   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 Interim medical history since our last visit reviewed. Allergies and medications reviewed and updated. Outpatient Medications Prior to Visit  Medication Sig Dispense Refill  . acetaminophen (TYLENOL) 500 MG tablet Take 1,000 mg by mouth as needed for moderate pain.     Marland Kitchen albuterol (VENTOLIN HFA) 108 (90 Base) MCG/ACT inhaler INHALE 2 PUFFS INTO THE LUNGS EVERY 6 HOURS AS NEEDED FOR WHEEZING OR SHORTNESS OF BREATH 18 g 3  . aspirin 81 MG tablet Take 1 tablet (81 mg total) by mouth daily.    . Calcium Citrate-Vitamin D (CALCIUM + D PO) Take 1 tablet by mouth daily.     . fluticasone (FLONASE) 50 MCG/ACT nasal spray Place 2 sprays into both nostrils daily. 16 mL 0  . montelukast (SINGULAIR) 10 MG tablet TAKE 1 TABLET BY MOUTH EVERY NIGHT AT BEDTIME 90 tablet 1  . vitamin E 180 MG (400 UNITS) capsule Take 400 Units by mouth daily.    Marland Kitchen levothyroxine (SYNTHROID) 175 MCG tablet TAKE 1 TABLET BY MOUTH EVERY MORNING 90 tablet 0  . lisinopril-hydrochlorothiazide (ZESTORETIC) 20-25 MG  tablet Take 1 tablet by mouth once daily 90 tablet 0  . loperamide (IMODIUM) 2 MG capsule Take 1 capsule (2 mg total) by mouth 4 (four) times daily as needed for diarrhea or loose stools. 21 capsule 0  . metFORMIN (GLUCOPHAGE) 500 MG tablet Take 1 tablet (500 mg total) by mouth daily with breakfast. (Patient taking differently: Take 500 mg by mouth daily with breakfast.) 90 tablet 1  . ondansetron (ZOFRAN ODT) 8 MG disintegrating tablet Take 1 tablet (8 mg total) by mouth every 8 (eight) hours as needed for nausea or  vomiting. 20 tablet 0  . benzonatate (TESSALON) 100 MG capsule Take 1 capsule (100 mg total) by mouth every 8 (eight) hours. 21 capsule 0   No facility-administered medications prior to visit.     Per HPI unless specifically indicated in ROS section below Review of Systems  Constitutional: Negative for activity change, appetite change, chills, fatigue, fever and unexpected weight change.  HENT: Negative for hearing loss.   Eyes: Negative for visual disturbance.  Respiratory: Positive for wheezing. Negative for cough, chest tightness and shortness of breath.   Cardiovascular: Negative for chest pain, palpitations and leg swelling.  Gastrointestinal: Negative for abdominal distention, abdominal pain, blood in stool, constipation, diarrhea, nausea and vomiting.  Genitourinary: Negative for difficulty urinating and hematuria.  Musculoskeletal: Negative for arthralgias, myalgias and neck pain.  Skin: Negative for rash.  Neurological: Negative for dizziness, seizures, syncope and headaches.  Hematological: Negative for adenopathy. Does not bruise/bleed easily.  Psychiatric/Behavioral: Negative for dysphoric mood. The patient is not nervous/anxious.    Objective:  BP 118/82   Pulse 88   Temp 98.1 F (36.7 C) (Temporal)   Ht 5' 2.25" (1.581 m)   Wt 167 lb 8 oz (76 kg)   LMP 11/12/2011   SpO2 96%   BMI 30.39 kg/m   Wt Readings from Last 3 Encounters:  12/07/20 167 lb 8 oz (76 kg)  10/04/20 155 lb (70.3 kg)  12/02/19 167 lb 1.6 oz (75.8 kg)      Physical Exam Vitals and nursing note reviewed.  Constitutional:      General: She is not in acute distress.    Appearance: Normal appearance. She is well-developed. She is not ill-appearing.  HENT:     Head: Normocephalic and atraumatic.     Right Ear: Hearing, tympanic membrane, ear canal and external ear normal.     Left Ear: Hearing, tympanic membrane, ear canal and external ear normal.  Eyes:     General: No scleral icterus.     Extraocular Movements: Extraocular movements intact.     Conjunctiva/sclera: Conjunctivae normal.     Pupils: Pupils are equal, round, and reactive to light.  Cardiovascular:     Rate and Rhythm: Normal rate and regular rhythm.     Pulses: Normal pulses.          Radial pulses are 2+ on the right side and 2+ on the left side.     Heart sounds: Normal heart sounds. No murmur heard.   Pulmonary:     Effort: Pulmonary effort is normal. No respiratory distress.     Breath sounds: Normal breath sounds. No wheezing, rhonchi or rales.  Abdominal:     General: Bowel sounds are normal. There is no distension.     Palpations: Abdomen is soft. There is no mass.     Tenderness: There is no abdominal tenderness. There is no guarding or rebound.     Hernia: No hernia  is present.     Comments: Chronic L pelvic discomfort to palpation   Musculoskeletal:        General: Normal range of motion.     Cervical back: Normal range of motion and neck supple.     Right lower leg: No edema.     Left lower leg: No edema.  Lymphadenopathy:     Cervical: No cervical adenopathy.  Skin:    General: Skin is warm and dry.     Findings: No rash.  Neurological:     General: No focal deficit present.     Mental Status: She is alert and oriented to person, place, and time.     Comments: CN grossly intact, station and gait intact  Psychiatric:        Behavior: Behavior normal.        Thought Content: Thought content normal.        Judgment: Judgment normal.       Results for orders placed or performed in visit on 12/05/20  CBC with Differential/Platelet  Result Value Ref Range   WBC 8.2 4.0 - 10.5 K/uL   RBC 4.82 3.87 - 5.11 Mil/uL   Hemoglobin 14.2 12.0 - 15.0 g/dL   HCT 43.1 36.0 - 46.0 %   MCV 89.4 78.0 - 100.0 fl   MCHC 32.9 30.0 - 36.0 g/dL   RDW 13.1 11.5 - 15.5 %   Platelets 159.0 150.0 - 400.0 K/uL   Neutrophils Relative % 55.1 43.0 - 77.0 %   Lymphocytes Relative 31.8 12.0 - 46.0 %    Monocytes Relative 9.4 3.0 - 12.0 %   Eosinophils Relative 3.2 0.0 - 5.0 %   Basophils Relative 0.5 0.0 - 3.0 %   Neutro Abs 4.5 1.4 - 7.7 K/uL   Lymphs Abs 2.6 0.7 - 4.0 K/uL   Monocytes Absolute 0.8 0.1 - 1.0 K/uL   Eosinophils Absolute 0.3 0.0 - 0.7 K/uL   Basophils Absolute 0.0 0.0 - 0.1 K/uL  T4, free  Result Value Ref Range   Free T4 1.03 0.60 - 1.60 ng/dL  Hepatitis C antibody  Result Value Ref Range   Hepatitis C Ab NON-REACTIVE NON-REACTI   SIGNAL TO CUT-OFF 0.02 <1.00  Hemoglobin A1c  Result Value Ref Range   Hgb A1c MFr Bld 7.1 (H) 4.6 - 6.5 %  TSH  Result Value Ref Range   TSH 1.95 0.35 - 4.50 uIU/mL  Comprehensive metabolic panel  Result Value Ref Range   Sodium 137 135 - 145 mEq/L   Potassium 4.6 3.5 - 5.1 mEq/L   Chloride 99 96 - 112 mEq/L   CO2 31 19 - 32 mEq/L   Glucose, Bld 146 (H) 70 - 99 mg/dL   BUN 20 6 - 23 mg/dL   Creatinine, Ser 0.84 0.40 - 1.20 mg/dL   Total Bilirubin 0.3 0.2 - 1.2 mg/dL   Alkaline Phosphatase 102 39 - 117 U/L   AST 18 0 - 37 U/L   ALT 39 (H) 0 - 35 U/L   Total Protein 7.5 6.0 - 8.3 g/dL   Albumin 4.2 3.5 - 5.2 g/dL   GFR 81.21 >60.00 mL/min   Calcium 9.7 8.4 - 10.5 mg/dL  Lipid panel  Result Value Ref Range   Cholesterol 211 (H) 0 - 200 mg/dL   Triglycerides (H) 0.0 - 149.0 mg/dL    486.0 Triglyceride is over 400; calculations on Lipids are invalid.   HDL 31.60 (L) >39.00 mg/dL   Total CHOL/HDL Ratio  7   LDL cholesterol, direct  Result Value Ref Range   Direct LDL 107.0 mg/dL   Assessment & Plan:  This visit occurred during the SARS-CoV-2 public health emergency.  Safety protocols were in place, including screening questions prior to the visit, additional usage of staff PPE, and extensive cleaning of exam room while observing appropriate contact time as indicated for disinfecting solutions.   Problem List Items Addressed This Visit    Hypothyroidism    Chronic, stable. Continue current regimen.       Relevant  Medications   levothyroxine (SYNTHROID) 175 MCG tablet   Dyslipidemia    Predominant hypertriglyceridemia currently off meds with trig levels 490s. In diabetic and h/o stroke, will start atorvastatin 40mg  - advised monitor for myalgias and let us know if they develop to titration dose. The ASCVD Risk score Mikey Bussing DC Jr., et al., 2013) failed to calculate for the following reasons:   The patient has a prior MI or stroke diagnosis       Relevant Medications   atorvastatin (LIPITOR) 40 MG tablet   HYPERTENSION, BENIGN SYSTEMIC    Chronic, stable on zestoretic - continue.      Relevant Medications   lisinopril-hydrochlorothiazide (ZESTORETIC) 20-25 MG tablet   atorvastatin (LIPITOR) 40 MG tablet   History of ischemic stroke    Remote L globus pallidus infarct. Continue aspirin. Restart statin.       Asthma    Continue singulair with PRN albuterol.       Chronic female pelvic pain    Known adhesions and h/o endometriosis Has established with [facility] GYN surgery - planning pelvic imaging then PFPT.  Needs to apply for financial assistance      Endometriosis    Discussed with patient.       Controlled diabetes mellitus type 2 with complications ([place])    Notes difficulty tolerating IR metformin (?blurry vision) - will trial XR metformin. RTC 6 mo DM f/u visit.       Relevant Medications   lisinopril-hydrochlorothiazide (ZESTORETIC) 20-25 MG tablet   metFORMIN (GLUCOPHAGE XR) 500 MG 24 hr tablet   atorvastatin (LIPITOR) 40 MG tablet   Encounter for well adult exam with abnormal findings - Primary    Preventative protocols reviewed and updated unless pt declined. Discussed healthy diet and lifestyle.  Info provided for [employer] ID program.           Meds ordered this encounter  Medications  . levothyroxine (SYNTHROID) 175 MCG tablet    Sig: Take 1 tablet (175 mcg total) by mouth every morning.    Dispense:  90 tablet    Refill:  3  . lisinopril-hydrochlorothiazide  (ZESTORETIC) 20-25 MG tablet    Sig: Take 1 tablet by mouth daily.    Dispense:  90 tablet    Refill:  3  . metFORMIN (GLUCOPHAGE XR) 500 MG 24 hr tablet    Sig: Take 1 tablet (500 mg total) by mouth daily with breakfast.    Dispense:  90 tablet    Refill:  3  . atorvastatin (LIPITOR) 40 MG tablet    Sig: Take 1 tablet (40 mg total) by mouth daily.    Dispense:  90 tablet    Refill:  3   No orders of the defined types were placed in this encounter.   Patient instructions: Puede llamar a [facility] [phone] y preguntar sobre tarjeta de identificacion.  Comeinze atorvastatina 40mg  diarios para controlar colesterol especialmente trigliceridos. Dejenos saber si  le empiezan a doler los musculos con Astronomer.  He rellenado otras medicinas.  Regresar en 6 meses para seguir diabetes  Follow up plan: Return in about 6 months (around 06/08/2021), or if symptoms worsen or fail to improve, for follow up visit.  Ria Bush, MD

## 2020-12-08 ENCOUNTER — Other Ambulatory Visit (HOSPITAL_COMMUNITY): Payer: Self-pay

## 2020-12-09 ENCOUNTER — Other Ambulatory Visit (HOSPITAL_COMMUNITY): Payer: Self-pay

## 2020-12-10 ENCOUNTER — Other Ambulatory Visit (HOSPITAL_COMMUNITY): Payer: Self-pay

## 2020-12-16 ENCOUNTER — Other Ambulatory Visit (HOSPITAL_COMMUNITY): Payer: Self-pay

## 2021-01-03 ENCOUNTER — Other Ambulatory Visit (HOSPITAL_COMMUNITY): Payer: Self-pay

## 2021-01-11 ENCOUNTER — Other Ambulatory Visit (HOSPITAL_COMMUNITY): Payer: Self-pay

## 2021-01-18 ENCOUNTER — Other Ambulatory Visit (HOSPITAL_COMMUNITY): Payer: Self-pay

## 2021-01-26 ENCOUNTER — Other Ambulatory Visit (HOSPITAL_COMMUNITY): Payer: Self-pay

## 2021-02-14 ENCOUNTER — Other Ambulatory Visit: Payer: Self-pay

## 2021-02-14 DIAGNOSIS — R234 Changes in skin texture: Secondary | ICD-10-CM

## 2021-03-09 ENCOUNTER — Telehealth (INDEPENDENT_AMBULATORY_CARE_PROVIDER_SITE_OTHER): Payer: Self-pay | Admitting: Family Medicine

## 2021-03-09 ENCOUNTER — Encounter: Payer: Self-pay | Admitting: Family Medicine

## 2021-03-09 ENCOUNTER — Other Ambulatory Visit (HOSPITAL_COMMUNITY): Payer: Self-pay

## 2021-03-09 VITALS — Temp 98.0°F

## 2021-03-09 DIAGNOSIS — R0981 Nasal congestion: Secondary | ICD-10-CM

## 2021-03-09 DIAGNOSIS — R059 Cough, unspecified: Secondary | ICD-10-CM

## 2021-03-09 MED ORDER — BENZONATATE 100 MG PO CAPS
100.0000 mg | ORAL_CAPSULE | Freq: Three times a day (TID) | ORAL | 0 refills | Status: DC | PRN
  Filled 2021-03-09: qty 20, 7d supply, fill #0

## 2021-03-09 MED FILL — Albuterol Sulfate Inhal Aero 108 MCG/ACT (90MCG Base Equiv): RESPIRATORY_TRACT | 20 days supply | Qty: 8.5 | Fill #0 | Status: AC

## 2021-03-09 NOTE — Progress Notes (Signed)
Virtual Visit via Telephone Note  I connected with [name] on 03/09/21 at 12:40 PM EDT by telephone and verified that I am speaking with the correct person using two identifiers.   I discussed the limitations, risks, security and privacy concerns of performing an evaluation and management service by telephone and the availability of in person appointments. I also discussed with the patient that there may be a patient responsible charge related to this service. The patient expressed understanding and agreed to proceed.  Location patient: home, [place] Location provider: work or home office Participants present for the call: patient, provider, patient's husband Patient did not have a visit with me in the prior 7 days to address this/these issue(s).   History of Present Illness:  Acute telemedicine visit for cough: -Onset: 5-6 days -Symptoms include:nasal congestion, cough, uses albuterol a few times per day for the cough and it is helping -Denies:CP, SOB, fever, NVD, inability to eat/drink/get out bed, wheezing -Pertinent past medical history: HTN, Asthma, obesity -Pertinent medication allergies:  Allergies  Allergen Reactions   Covid-19 (Mrna) Vaccine Therapist, music) [Covid-19 (Mrna) Vaccine] Hives    Hives after 2nd Pfizer COVID vaccine   Voltaren [Diclofenac Sodium] Rash    Diffuse pruritic urticarial rash   -COVID-19 vaccine status: vaccinated x 2   Observations/Objective: Patient sounds cheerful and well on the phone. I do not appreciate any SOB. Speech and thought processing are grossly intact. Patient reported vitals:  Assessment and Plan:  Cough  Nasal congestion  -we discussed possible serious and likely etiologies, options for evaluation and workup, limitations of telemedicine visit vs in person visit, treatment, treatment risks and precautions. Pt prefers to treat via telemedicine empirically rather than in person at this moment. Query Covid19, VURI,  bronchitis vs other. She opted for covid testing and she and her husband agree to do this today, nasal saline, albuterol as needed q4-6 hours and tessalon for cough. Other care tips provided in patient instructions.  Work/School slipped offered: declined Scheduled follow up with PCP offered: advised to schedule follow up Monday with PCP office. Husband agrees to call to schedule.  Advised to seek prompt in person care if worsening, new symptoms arise, or if is not improving with treatment. Advised of options for inperson care in case PCP office not available. Did let the patient know that I only do telemedicine shifts for [place] on Tuesdays and Thursdays and advised a follow up visit with PCP or at an [facility] if has further questions or concerns.   Follow Up Instructions:  I did not refer this patient for an OV with me in the next 24 hours for this/these issue(s).  I discussed the assessment and treatment plan with the patient. The patient was provided an opportunity to ask questions and all were answered. The patient agreed with the plan and demonstrated an understanding of the instructions.   I spent 15 minutes on the date of this visit in the care of this patient. See summary of tasks completed to properly care for this patient in the detailed notes above which also included counseling of above, review of PMH, medications, allergies, evaluation of the patient and ordering and/or  instructing patient on testing and care options.     Lucretia Kern, DO

## 2021-03-09 NOTE — Patient Instructions (Signed)
  HOME CARE TIPS:  -[facility] testing information: [email or web address] OR [phone] Most pharmacies also offer testing and home test kits. If the Covid19 test is positive, please make a prompt follow up visit with your primary care office or with [facility] to discuss treatment options. Treatments for Covid19 are best given early in the course of the illness.   -continue the albuterol every 4-6 hours as needed for asthma symptoms, wheezing, excessive cough, etc - if symptoms are not responding to the albuterol or chest pain or trouble breathing please seek immediate medical care.   -I sent the medication(s) we discussed to your pharmacy: Meds ordered this encounter  Medications   benzonatate (TESSALON PERLES) 100 MG capsule    Sig: Take 1 capsule (100 mg total) by mouth 3 (three) times daily as needed.    Dispense:  20 capsule    Refill:  0   -can use tylenol  if needed for fevers, aches and pains per instructions  -can use nasal saline a few times per day if you have nasal congestion; sometimes  a short course of Afrin nasal spray for 3 days can help with symptoms as well  -stay hydrated, drink plenty of fluids and eat small healthy meals - avoid dairy  -can take 1000 IU (19mcg) Vit D3 and 100-500 mg of Vit C daily per instructions  -If the Covid test is positive, check out the [facility] website for more information on home care, transmission and treatment for COVID19  -follow up with your doctor in 2-3 days unless improving and feeling better  -stay home while sick, except to seek medical care. If you have COVID19, ideally it would be best to stay home for a full 10 days since the onset of symptoms PLUS one day of no fever and feeling better. Wear a good mask that fits snugly (such as N95 or KN95) if around others to reduce the risk of transmission.  It was nice to meet you today, and I really hope you are feeling better soon. I help  [facility] out with telemedicine visits on Tuesdays and Thursdays and am available for visits on those days. If you have any concerns or questions following this visit please schedule a follow up visit with your Primary Care doctor or seek care at a local urgent care clinic to avoid delays in care.    Seek in person care or schedule a follow up video visit promptly if your symptoms worsen, new concerns arise or you are not improving with treatment. Call 911 and/or seek emergency care if your symptoms are severe or life threatening.

## 2021-03-30 ENCOUNTER — Other Ambulatory Visit: Payer: Self-pay | Admitting: Family Medicine

## 2021-04-11 ENCOUNTER — Other Ambulatory Visit: Payer: Self-pay

## 2021-04-11 ENCOUNTER — Ambulatory Visit: Payer: Self-pay

## 2021-05-16 ENCOUNTER — Ambulatory Visit
Admission: RE | Admit: 2021-05-16 | Discharge: 2021-05-16 | Disposition: A | Discharge status: Home / Self Care | Payer: No Typology Code available for payment source | Source: Ambulatory Visit | Attending: Obstetrics and Gynecology | Admitting: Obstetrics and Gynecology

## 2021-05-16 ENCOUNTER — Other Ambulatory Visit: Payer: Self-pay

## 2021-05-16 ENCOUNTER — Ambulatory Visit: Payer: Self-pay | Admitting: *Deleted

## 2021-05-16 ENCOUNTER — Other Ambulatory Visit: Payer: Self-pay | Admitting: Obstetrics and Gynecology

## 2021-05-16 VITALS — BP 157/99 | Wt 173.6 lb

## 2021-05-16 DIAGNOSIS — Z1239 Encounter for other screening for malignant neoplasm of breast: Secondary | ICD-10-CM

## 2021-05-16 DIAGNOSIS — N6452 Nipple discharge: Secondary | ICD-10-CM

## 2021-05-16 DIAGNOSIS — N644 Mastodynia: Secondary | ICD-10-CM

## 2021-05-16 DIAGNOSIS — R234 Changes in skin texture: Secondary | ICD-10-CM

## 2021-05-16 NOTE — Patient Instructions (Signed)
Explained breast self awareness with [facility]. Patient did not need a Pap smear today due to last Pap smear was in July 2020 per patient and patient has history of a hysterectomy for benign reasons. Let patient know that she doesn't need any further Pap smears due to her history of a hysterectomy for benign reasons. Referred patient to the [facility] for a diagnostic mammogram. Appointment scheduled Tuesday, May 16, 2021 at 1030. Patient aware of appointment and will be there. [place] verbalized understanding.  Steffon Gladu, Arvil Chaco, RN 9:39 AM

## 2021-05-16 NOTE — Progress Notes (Signed)
[name] is a 50 y.o. female who presents to [facility] clinic today with complaint of left breast pain for over 18 months that comes and goes. Patient rates the pain at a 9 out of 10. Patient stated the pain is the same as per previous exam 04/05/2020. Diagnostic mammogram was completed for follow-up that was benign. Patient complained of spontaneous left breast milky discharge x 8 months.   Pap Smear: Pap not smear completed today. Last Pap smear was in July 2020 by Dr. Orlie Dakin and normal per patient. Per patient has no history of an abnormal Pap smear. Patient has a history of a hysterectomy 04/24/2012 due to AUB. Patient doesn't need any further Pap smears due to her history of a hysterectomy for benign reasons per BCCCP and ASCCP guidelines. Last Pap smear result is not in Epic. Previous Pap smear 03/31/2012 and hysterectomy results are in [place].   Physical exam: Breasts Breasts symmetrical. No skin abnormalities bilateral breasts. No nipple retraction bilateral breasts. No nipple discharge bilateral breasts. Unable to express and nipple discharge on exam. No lymphadenopathy. No lumps palpated bilateral breasts. Unable to palpate any lumps in patients area of concern within left breast. Complaints of left lower breast pain on exam.      MM DIAG BREAST TOMO UNI LEFT  Result Date: 02/18/2020 CLINICAL DATA:  Patient for short-term follow-up probably benign left breast mass 7:30 o'clock. EXAM: DIGITAL DIAGNOSTIC LEFT MAMMOGRAM WITH CAD AND TOMO ULTRASOUND LEFT BREAST COMPARISON:  Previous exam(s). ACR Breast Density Category c: The breast tissue is heterogeneously dense, which may obscure small masses. FINDINGS: Persistent lobular mass located within the medial left breast demonstrated best on the cc view. No new masses, calcifications or distortion identified within the left breast. Mammographic images were processed with CAD. Targeted ultrasound is performed, showing a grossly  stable 6 x 2 x 5 mm cluster of cysts left breast 7:30 o'clock 4 cm from the nipple. IMPRESSION: Stable probably benign left breast mass favored to represent a cluster of cysts. RECOMMENDATION: Bilateral diagnostic mammography and left breast ultrasound 03/2020 I have discussed the findings and recommendations with the patient. If applicable, a reminder letter will be sent to the patient regarding the next appointment. BI-RADS CATEGORY  3: Probably benign. Electronically Signed   By: Lovey Newcomer M.D.   On: 02/18/2020 10:25   MS DIGITAL SCREENING TOMO BILATERAL  Result Date: 04/03/2019 CLINICAL DATA:  Screening. EXAM: DIGITAL SCREENING BILATERAL MAMMOGRAM WITH TOMO AND CAD COMPARISON:  None ACR Breast Density Category c: The breast tissue is heterogeneously dense, which may obscure small masses. FINDINGS: In the left breast, possible mass further evaluation. In the right breast, no findings suspicious for malignancy. Images were processed with CAD. IMPRESSION: Further evaluation is suggested for possible masses in the left breast. RECOMMENDATION: Diagnostic mammogram and possibly ultrasound of the left breast. (Code:[id number]) The patient will be contacted regarding the findings, and additional imaging will be scheduled. BI-RADS CATEGORY  0: Incomplete. Need additional imaging evaluation and/or prior mammograms for comparison. Electronically Signed   By: Nolon Nations M.D.   On: 04/03/2019 08:16   MS DIGITAL DIAG TOMO BILAT  Result Date: 04/05/2020 CLINICAL DATA:  50 year old female for follow-up of LEFT breast mass and for annual bilateral mammogram. EXAM: DIGITAL DIAGNOSTIC BILATERAL MAMMOGRAM WITH CAD AND TOMO ULTRASOUND LEFT BREAST COMPARISON:  Previous exam(s). ACR Breast Density Category b: There are scattered areas of fibroglandular density. FINDINGS: 2D/3D full field views of both breasts demonstrate no  suspicious mass, distortion or worrisome calcifications. The previously identified LEFT breast  masses identified mammographically are now difficult to visualize. Mammographic images were processed with CAD. Targeted ultrasound is performed, showing a 0.6 x 0.3 x 0.4 cm area of apocrine metaplasia at the 7:30 position of the LEFT breast 4 cm from the nipple, slightly decreased in size from the prior study. IMPRESSION: 1. Benign Apocrine metaplasia within the LOWER LEFT breast. No further imaging follow-up recommended. 2. No mammographic evidence of breast malignancy. RECOMMENDATION: Bilateral screening mammogram in 1 year. I have discussed the findings and recommendations with the patient. If applicable, a reminder letter will be sent to the patient regarding the next appointment. BI-RADS CATEGORY  2: Benign. Electronically Signed   By: Margarette Canada M.D.   On: 04/05/2020 11:30   MS DIGITAL DIAG TOMO UNI LEFT  Addendum Date: 07/23/2019   ADDENDUM REPORT: 07/23/2019 15:18 ADDENDUM: There is a typo in the body of the report. It should read that there is probable apocrine metaplasia at 7:30, 4 cm from the nipple. There is a second typo in the recommendations section which should also read recommend six-month follow-up mammogram and ultrasound of the probable apocrine metaplasia seen in the medial left breast. Electronically Signed   By: Dorise Bullion III M.D   On: 07/23/2019 15:18   Result Date: 07/23/2019 CLINICAL DATA:  The patient was called back from screening mammography performed July 30th, 2020 for 2 left breast masses. EXAM: DIGITAL DIAGNOSTIC LEFT MAMMOGRAM WITH TOMO ULTRASOUND LEFT BREAST COMPARISON:  Previous exam(s). ACR Breast Density Category b: There are scattered areas of fibroglandular density. FINDINGS: Both breast masses are only seen on the CC view. First is located laterally. This mass is oval and circumscribed. The second is seen medially and demonstrates several gentle lobulations, measuring up to 5 mm. On physical exam, no suspicious lumps are identified. Targeted ultrasound is  performed, showing a simple cyst in the lateral left breast correlating with the mammographic findings. There is probable April 2 metaplasia at 7:30, 4 cm from the nipple on the left, likely correlating with the mammographic finding measuring 7 x 4 by 5 mm. IMPRESSION: Fibrocystic changes laterally in the left breast. Probable apical metaplasia at 7:30 in the left breast, best seen mammographically. RECOMMENDATION: Recommend six-month follow-up mammogram and ultrasound of the probable hip for 2 metaplasia seen in the medial left breast. I have discussed the findings and recommendations with the patient. If applicable, a reminder letter will be sent to the patient regarding the next appointment. BI-RADS CATEGORY  3: Probably benign. Electronically Signed: By: Dorise Bullion III M.D On: 07/23/2019 12:01     Pelvic/Bimanual Pap is not indicated today per BCCCP guidelines.   Smoking History: Patient has never smoked.   Patient Navigation: Patient education provided. Access to services provided for patient through [facility] program. Spanish interpreter Rudene Anda from [facility] provided.   Colorectal Cancer Screening: Per patient has had colonoscopy completed on 02/13/2019.  No complaints today.    Breast and Cervical Cancer Risk Assessment: Patient does not have family history of breast cancer, known genetic mutations, or radiation treatment to the chest before age 57. Patient does not have history of cervical dysplasia, immunocompromised, or DES exposure in-utero.  Risk Assessment     Risk Scores       05/16/2021 07/23/2019   Last edited by: Demetrius Revel, LPN Karrie Fluellen, Heath Gold, RN   5-year risk: 1 % 0.9 %   Lifetime risk: 8.6 % 8.9 %  A: BCCCP exam without pap smear Complaint of left breast pain, discharge, and possible lumps.  P: Referred patient to the [facility] for a diagnostic mammogram. Appointment scheduled Tuesday, May 16, 2021 at  1030.  Loletta Parish, RN 05/16/2021 9:39 AM

## 2021-05-17 ENCOUNTER — Other Ambulatory Visit (HOSPITAL_COMMUNITY): Payer: Self-pay

## 2021-05-18 ENCOUNTER — Other Ambulatory Visit (HOSPITAL_COMMUNITY): Payer: Self-pay

## 2021-05-18 ENCOUNTER — Ambulatory Visit
Admission: RE | Admit: 2021-05-18 | Discharge: 2021-05-18 | Disposition: A | Discharge status: Home / Self Care | Payer: No Typology Code available for payment source | Source: Ambulatory Visit | Attending: Obstetrics and Gynecology | Admitting: Obstetrics and Gynecology

## 2021-05-18 DIAGNOSIS — N6452 Nipple discharge: Secondary | ICD-10-CM

## 2021-05-18 MED ORDER — GADOBENATE DIMEGLUMINE 529 MG/ML IV SOLN
8.0000 mL | Freq: Once | INTRAVENOUS | Status: AC | PRN
  Administered 2021-05-18: 8 mL via INTRAVENOUS

## 2021-05-26 ENCOUNTER — Other Ambulatory Visit: Payer: Self-pay | Admitting: Surgery

## 2021-05-26 DIAGNOSIS — N6320 Unspecified lump in the left breast, unspecified quadrant: Secondary | ICD-10-CM

## 2021-05-26 DIAGNOSIS — N6452 Nipple discharge: Secondary | ICD-10-CM

## 2021-05-31 ENCOUNTER — Encounter: Payer: Self-pay | Admitting: Family Medicine

## 2021-05-31 DIAGNOSIS — N63 Unspecified lump in unspecified breast: Secondary | ICD-10-CM | POA: Insufficient documentation

## 2021-06-06 ENCOUNTER — Other Ambulatory Visit: Payer: Self-pay | Admitting: Surgery

## 2021-06-06 DIAGNOSIS — N6452 Nipple discharge: Secondary | ICD-10-CM

## 2021-06-16 ENCOUNTER — Inpatient Hospital Stay: Admission: RE | Admit: 2021-06-16 | Payer: No Typology Code available for payment source | Source: Ambulatory Visit

## 2021-06-16 ENCOUNTER — Other Ambulatory Visit: Payer: No Typology Code available for payment source

## 2021-06-19 ENCOUNTER — Other Ambulatory Visit: Payer: Self-pay | Admitting: Surgery

## 2021-06-19 DIAGNOSIS — N6452 Nipple discharge: Secondary | ICD-10-CM

## 2021-06-22 ENCOUNTER — Ambulatory Visit
Admission: RE | Admit: 2021-06-22 | Discharge: 2021-06-22 | Disposition: A | Discharge status: Home / Self Care | Payer: No Typology Code available for payment source | Source: Ambulatory Visit | Attending: Surgery | Admitting: Surgery

## 2021-06-22 ENCOUNTER — Other Ambulatory Visit: Payer: Self-pay | Admitting: General Practice

## 2021-06-22 ENCOUNTER — Other Ambulatory Visit: Payer: Self-pay

## 2021-06-22 DIAGNOSIS — N6452 Nipple discharge: Secondary | ICD-10-CM

## 2021-06-22 MED ORDER — GADOBUTROL 1 MMOL/ML IV SOLN
8.0000 mL | Freq: Once | INTRAVENOUS | Status: AC | PRN
  Administered 2021-06-22: 8 mL via INTRAVENOUS

## 2021-07-16 ENCOUNTER — Other Ambulatory Visit (HOSPITAL_COMMUNITY): Payer: Self-pay

## 2021-07-16 MED FILL — Albuterol Sulfate Inhal Aero 108 MCG/ACT (90MCG Base Equiv): RESPIRATORY_TRACT | 25 days supply | Qty: 18 | Fill #1 | Status: AC

## 2021-07-17 ENCOUNTER — Other Ambulatory Visit (HOSPITAL_COMMUNITY): Payer: Self-pay

## 2021-08-15 ENCOUNTER — Other Ambulatory Visit: Payer: Self-pay

## 2021-08-15 ENCOUNTER — Other Ambulatory Visit (HOSPITAL_COMMUNITY): Payer: Self-pay

## 2021-08-15 ENCOUNTER — Ambulatory Visit (INDEPENDENT_AMBULATORY_CARE_PROVIDER_SITE_OTHER): Payer: No Typology Code available for payment source

## 2021-08-15 ENCOUNTER — Encounter (HOSPITAL_COMMUNITY): Payer: Self-pay | Admitting: Emergency Medicine

## 2021-08-15 ENCOUNTER — Ambulatory Visit (HOSPITAL_COMMUNITY)
Admission: EM | Admit: 2021-08-15 | Discharge: 2021-08-15 | Disposition: A | Discharge status: Home / Self Care | Payer: No Typology Code available for payment source | Attending: Internal Medicine | Admitting: Internal Medicine

## 2021-08-15 DIAGNOSIS — Z20822 Contact with and (suspected) exposure to covid-19: Secondary | ICD-10-CM | POA: Insufficient documentation

## 2021-08-15 DIAGNOSIS — R509 Fever, unspecified: Secondary | ICD-10-CM | POA: Insufficient documentation

## 2021-08-15 DIAGNOSIS — R0602 Shortness of breath: Secondary | ICD-10-CM

## 2021-08-15 DIAGNOSIS — J209 Acute bronchitis, unspecified: Secondary | ICD-10-CM

## 2021-08-15 DIAGNOSIS — R059 Cough, unspecified: Secondary | ICD-10-CM

## 2021-08-15 DIAGNOSIS — J029 Acute pharyngitis, unspecified: Secondary | ICD-10-CM | POA: Insufficient documentation

## 2021-08-15 LAB — POC INFLUENZA A AND B ANTIGEN (URGENT CARE ONLY)
INFLUENZA A ANTIGEN, POC: NEGATIVE
INFLUENZA B ANTIGEN, POC: NEGATIVE

## 2021-08-15 MED ORDER — HYDROCODONE BIT-HOMATROP MBR 5-1.5 MG/5ML PO SOLN: 5.0000 mL | Freq: Four times a day (QID) | ORAL | 0 refills | Status: DC | PRN

## 2021-08-15 MED ORDER — AZITHROMYCIN 250 MG PO TABS
ORAL_TABLET | ORAL | 0 refills | Status: DC
  Filled 2021-08-15: qty 6, 5d supply, fill #0

## 2021-08-15 MED ORDER — HYDROCODONE BIT-HOMATROP MBR 5-1.5 MG/5ML PO SOLN
ORAL | 0 refills | Status: DC
  Filled 2021-08-15: qty 120, 6d supply, fill #0

## 2021-08-15 MED ORDER — METHYLPREDNISOLONE 4 MG PO TBPK
ORAL_TABLET | ORAL | 0 refills | Status: DC
  Filled 2021-08-15: qty 21, 6d supply, fill #0

## 2021-08-15 NOTE — ED Triage Notes (Signed)
Pt presents with cough, chills, sore throat and sob Xs 10 days.

## 2021-08-15 NOTE — ED Provider Notes (Signed)
[facility]    CSN: [id number] Arrival date & time: 08/15/21  1220      History   Chief Complaint Chief Complaint  Patient presents with   Sore Throat   Cough   Shortness of Breath    HPI [name] is a 50 y.o. female who presents with nose congestion and ST with mild cough for the past 10 days, but since yesterday developed subjective fever, body aches and worse non productive cough. Has been using her inhaler. Her eyes have been watering a lot. She did a  home covid test on 12/8 and was neg. Since she has Asthma, she does not leave her house and when she does it is only to pick up her daughter, but does not get out of the car. Her family has not been sick.     Past Medical History:  Diagnosis Date   Bilateral ovarian cysts    Chronic pelvic pain in female    Complication of anesthesia    Hemorrhoids    Hepatic steatosis 07/2006, 04/2014   on Abd CT done for pain   History of anal fissures    History of CVA (cerebrovascular accident) without residual deficits followed by pcp--- takes asa 81mg    04/ 2008--- due to cerebrovascular occlusion,  left basal ganglia subacute infarct;   per pt no residuals   History of ectopic pregnancy    2007--  treated with medication   History of hyperthyroidism    s/p  RAI   03-25-2001   Hydrosalpinx    Hypertension    followed by pcp   Hypothyroidism, postradioiodine therapy    followed by pcp---  multinodular goiter;   s/p  RAI 03-25-2001 for hyperthyroidism   PCOS (polycystic ovarian syndrome)    Pelvic adhesions    PONV (postoperative nausea and vomiting)    Seasonal asthma    followed by pcp--- no inhaler, takes singulair   Type II diabetes mellitus ([place]) dx'd 12/2015   followed by pcp ---   (11-06-2019  per pt does not check blood sugar at home)    Patient Active Problem List   Diagnosis Date Noted   Breast mass 05/31/2021   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   Mass of occipital region  01/11/2017   Encounter for well adult exam with abnormal findings 01/11/2017   Controlled diabetes mellitus type 2 with complications ([place]) 95/05/3266   Endometriosis    Chronic female pelvic pain 05/07/2012   MIGRAINE HEADACHE 05/12/2007   History of ischemic stroke 12/12/2006   Hypothyroidism 10/31/2006   Dyslipidemia 10/31/2006   Obesity, Class I, BMI 30-34.9 10/31/2006   HYPERTENSION, BENIGN SYSTEMIC 10/31/2006   Asthma 10/31/2006    Past Surgical History:  Procedure Laterality Date   ABDOMINAL HYSTERECTOMY  04/24/2012   Procedure: HYSTERECTOMY ABDOMINAL;  Surgeon: Terrance Mass, MD;  Location: [facility];  Service: Gynecology;;   ANAL SPHINCTEROTOMY  05-08-2019    @[facility]   ruptured   CERVICAL CERCLAGE  11-06-2008;  10-07-2010  @[facility]    CESAREAN SECTION W/BTL Bilateral 12-24-2010   @[facility]    bilateral tubal ligation with filshie clips   COLONOSCOPY  last one 05-08-2019  @[facility]   anal fissure, polyps (TA, HP), rpt 7 yrs ([facility] @ [facility])   ESOPHAGOGASTRODUODENOSCOPY  10/2014   WNL   EXCISION OF ENDOMETRIOMA N/A 08/20/2016   Procedure: EXCISION OF ENDOMETRIOMA LEFT LOWER QUADRANT;  Surgeon: Georganna Skeans, MD;  Location: [facility];  Service: General;  Laterality: N/A;   EXPLORATORY LAPAROTOMY  03-03-2004    @[facility]    WITH LYSIS ADHESIONS   FLEXIBLE SIGMOIDOSCOPY  03/12/2019    @[facility]   s/p botox injection for anal fissure ([facility] @ [facility])   LAPAROSCOPIC CHOLECYSTECTOMY  09/1995   LAPAROSCOPIC HYSTERECTOMY  04/24/2012   Procedure: HYSTERECTOMY TOTAL LAPAROSCOPIC;  Surgeon: Terrance Mass, MD;  Location: [facility];  Service: Gynecology;  Laterality: N/A;  2 1/2 hours OR time.  Dr. Phineas Real to assisting    [place] OOPHERECTOMY Bilateral 11/11/2019   diagnostic laparoscopy unsuccessful - extensive adhesions, unable to visualize adnexa (Anyanwu, Sallyanne Havers, MD)    OB History     Gravida  5    Para  1   Term  0   Preterm  1   AB  3   Living         SAB  3   IAB  0   Ectopic  0   Multiple      Live Births               Home Medications    Prior to Admission medications   Medication Sig Start Date End Date Taking? Authorizing Provider  azithromycin (ZITHROMAX Z-PAK) 250 MG tablet Take 2 tablets by mouth today, then 1 tablet daily for 4 days 08/15/21  Yes Rodriguez-Southworth, Sunday Spillers, PA-C  HYDROcodone bit-homatropine (HYCODAN) 5-1.5 MG/5ML syrup Take 5 mLs by mouth every 6 (six) hours as needed for cough. 08/15/21  Yes Rodriguez-Southworth, Sunday Spillers, PA-C  methylPREDNISolone (MEDROL DOSEPAK) 4 MG TBPK tablet Take as directed 08/15/21  Yes Rodriguez-Southworth, Sunday Spillers, PA-C  acetaminophen (TYLENOL) 500 MG tablet Take 1,000 mg by mouth as needed for moderate pain.     [provider]  albuterol (VENTOLIN HFA) 108 (90 Base) MCG/ACT inhaler INHALE 2 PUFFS INTO THE LUNGS EVERY 6 HOURS AS NEEDED FOR WHEEZING OR SHORTNESS OF BREATH 10/04/20 10/04/21  Ria Bush, MD  aspirin 81 MG tablet Take 1 tablet (81 mg total) by mouth daily. 01/11/17   Ria Bush, MD  atorvastatin (LIPITOR) 40 MG tablet Take 1 tablet (40 mg total) by mouth daily. 12/07/20   Ria Bush, MD  benzonatate (TESSALON PERLES) 100 MG capsule Take 1 capsule (100 mg total) by mouth 3 (three) times daily as needed. 03/09/21   Lucretia Kern, DO  Calcium Citrate-Vitamin D (CALCIUM + D PO) Take 1 tablet by mouth daily.     [provider]  fluticasone (FLONASE) 50 MCG/ACT nasal spray Place 2 sprays into both nostrils daily. 10/01/20   Hughie Closs, PA-C  levothyroxine (SYNTHROID) 175 MCG tablet Take 1 tablet (175 mcg total) by mouth every morning. 12/07/20   Ria Bush, MD  lisinopril-hydrochlorothiazide (ZESTORETIC) 20-25 MG tablet Take 1 tablet by mouth once daily 03/30/21   Ria Bush, MD  metFORMIN (GLUCOPHAGE XR) 500 MG 24 hr tablet Take 1 tablet by mouth daily  with breakfast. 12/07/20   Ria Bush, MD  montelukast (SINGULAIR) 10 MG tablet TAKE 1 TABLET BY MOUTH EVERY NIGHT AT BEDTIME 10/04/20 10/04/21  Ria Bush, MD  vitamin E 180 MG (400 UNITS) capsule Take 400 Units by mouth daily.    [provider]    Family History Family History  Problem Relation Age of Onset   Hypertension Father    Stroke Father        hemorrhagic, deceased   Diabetes Mother    Uterine cancer  Mother    Coronary artery disease Neg Hx    Cancer Neg Hx     Social History Social History   Tobacco Use   Smoking status: Never   Smokeless tobacco: Never  Vaping Use   Vaping Use: Never used  Substance Use Topics   Alcohol use: No   Drug use: No     Allergies   Covid-19 (mrna) vaccine (pfizer) [covid-19 (mrna) vaccine], Amoxicillin, and Voltaren [diclofenac sodium]   Review of Systems Review of Systems  Constitutional:  Positive for appetite change, chills, diaphoresis, fatigue and fever. Negative for activity change.  HENT:  Positive for congestion, rhinorrhea and sore throat. Negative for ear discharge, ear pain and trouble swallowing.   Eyes:  Negative for discharge.  Respiratory:  Positive for cough, shortness of breath and wheezing. Negative for chest tightness.   Cardiovascular:  Negative for chest pain.  Gastrointestinal:  Negative for diarrhea, nausea and vomiting.  Musculoskeletal:  Positive for myalgias.  Skin:  Negative for rash.  Neurological:  Positive for headaches.  Hematological:  Negative for adenopathy.    Physical Exam Triage Vital Signs ED Triage Vitals  Enc Vitals Group     BP 08/15/21 1352 (!) 174/113     Pulse Rate 08/15/21 1352 (!) 111     Resp 08/15/21 1352 17     Temp 08/15/21 1352 100.1 F (37.8 C)     Temp Source 08/15/21 1352 Oral     SpO2 08/15/21 1352 97 %     Weight --      Height --      Head Circumference --      Peak Flow --      Pain Score 08/15/21 1349 10     Pain Loc --      Pain Edu?  --      Excl. in [place]? --    No data found.  Updated Vital Signs BP (!) 174/113 (BP Location: Right Arm)    Pulse (!) 111    Temp 100.1 F (37.8 C) (Oral)    Resp 17    LMP 11/12/2011    SpO2 97%   Visual Acuity Right Eye Distance:   Left Eye Distance:   Bilateral Distance:    Right Eye Near:   Left Eye Near:    Bilateral Near:       Physical Exam Vitals signs and nursing note reviewed.  Constitutional:      General: She is not in acute distress.    Appearance: Normal appearance. She is ill-appearing, but not toxic-appearing or diaphoretic.  HENT:     Head: Normocephalic.     Right Ear: Tympanic membrane, ear canal and external ear normal.     Left Ear: Tympanic membrane, ear canal and external ear normal.     Nose: Nose normal.     Mouth/Throat:     Mouth: Mucous membranes are moist.  Eyes:     General: No scleral icterus.       Right eye: No discharge.        Left eye: No discharge.     Conjunctiva/sclera: Conjunctivae normal.  Neck:     Musculoskeletal: Neck supple. No neck rigidity.  Cardiovascular:     Rate and Rhythm: Normal rate and regular rhythm.     Heart sounds: No murmur.  Pulmonary:     Effort: Pulmonary effort is normal.     Breath sounds: Normal breath sounds.  Musculoskeletal: Normal range of motion.  Lymphadenopathy:  Cervical: No cervical adenopathy.  Skin:    General: Skin is warm and dry.     Coloration: Skin is not jaundiced.     Findings: No rash.  Neurological:     Mental Status: She is alert and oriented to person, place, and time.     Gait: Gait normal.  Psychiatric:        Mood and Affect: Mood normal.        Behavior: Behavior normal.        Thought Content: Thought content normal.        Judgment: Judgment normal.   UC Treatments / Results  Labs (all labs ordered are listed, but only abnormal results are displayed) Labs Reviewed  SARS CORONAVIRUS 2 (TAT 6-24 HRS)  POC INFLUENZA A AND B ANTIGEN (URGENT CARE ONLY)     EKG   Radiology DG Chest 2 View  Result Date: 08/15/2021 CLINICAL DATA:  cough, SOB EXAM: CHEST - 2 VIEW COMPARISON:  Radiograph 11/13/2019, chest CT 11/13/2019 FINDINGS: Unchanged cardiomediastinal silhouette. There is no focal airspace disease. There is no large pleural effusion. No visible pneumothorax. There is no acute osseous abnormality. IMPRESSION: No evidence of acute cardiopulmonary disease. Electronically Signed   By: Maurine Simmering M.D.   On: 08/15/2021 14:21    Procedures Procedures (including critical care time)  Medications Ordered in UC Medications - No data to display  Initial Impression / Assessment and Plan / UC Course  I have reviewed the triage vital signs and the nursing notes. Pertinent labs & imaging results that were available during my care of the patient were reviewed by me and considered in my medical decision making (see chart for details). Covid test is pending. Possibly bronchitis. In the mean time I will cover her for bronchitis with Zpack, medrol dose pack and placed her on Hycodan as noted.  We will notify her if the Covid test is positive.     Final Clinical Impressions(s) / UC Diagnoses   Final diagnoses:  Acute bronchitis, unspecified organism   Discharge Instructions   None    ED Prescriptions     Medication Sig Dispense Auth. Provider   methylPREDNISolone (MEDROL DOSEPAK) 4 MG TBPK tablet Take as directed 21 tablet Rodriguez-Southworth, Sunday Spillers, PA-C   azithromycin (ZITHROMAX Z-PAK) 250 MG tablet Take 2 tablets by mouth today, then 1 tablet daily for 4 days 6 tablet Rodriguez-Southworth, Sunday Spillers, PA-C   HYDROcodone bit-homatropine (HYCODAN) 5-1.5 MG/5ML syrup Take 5 mLs by mouth every 6 (six) hours as needed for cough. 120 mL Rodriguez-Southworth, Sunday Spillers, PA-C      I have reviewed the PDMP during this encounter.   Shelby Mattocks, PA-C 08/15/21 1531

## 2021-08-16 LAB — SARS CORONAVIRUS 2 (TAT 6-24 HRS): SARS Coronavirus 2: NEGATIVE

## 2021-08-29 ENCOUNTER — Other Ambulatory Visit (HOSPITAL_COMMUNITY): Payer: Self-pay

## 2021-09-07 ENCOUNTER — Other Ambulatory Visit (HOSPITAL_COMMUNITY): Payer: Self-pay

## 2021-09-16 ENCOUNTER — Other Ambulatory Visit (HOSPITAL_COMMUNITY): Payer: Self-pay

## 2021-11-14 ENCOUNTER — Other Ambulatory Visit: Payer: Self-pay

## 2021-11-14 ENCOUNTER — Other Ambulatory Visit: Payer: Self-pay | Admitting: Obstetrics and Gynecology

## 2021-11-14 ENCOUNTER — Other Ambulatory Visit: Payer: No Typology Code available for payment source

## 2021-11-14 ENCOUNTER — Ambulatory Visit
Admission: RE | Admit: 2021-11-14 | Discharge: 2021-11-14 | Disposition: A | Discharge status: Home / Self Care | Payer: No Typology Code available for payment source | Source: Ambulatory Visit | Attending: Obstetrics and Gynecology | Admitting: Obstetrics and Gynecology

## 2021-11-14 DIAGNOSIS — R234 Changes in skin texture: Secondary | ICD-10-CM

## 2022-01-02 ENCOUNTER — Other Ambulatory Visit: Payer: Self-pay | Admitting: Family Medicine

## 2022-01-03 ENCOUNTER — Other Ambulatory Visit (HOSPITAL_COMMUNITY): Payer: Self-pay

## 2022-01-03 MED ORDER — LEVOTHYROXINE SODIUM 175 MCG PO TABS
175.0000 ug | ORAL_TABLET | Freq: Every morning | ORAL | 0 refills | Status: DC
  Filled 2022-01-03: qty 90, 90d supply, fill #0

## 2022-01-05 ENCOUNTER — Other Ambulatory Visit (HOSPITAL_COMMUNITY): Payer: Self-pay

## 2022-01-22 ENCOUNTER — Encounter: Payer: Self-pay | Admitting: Family Medicine

## 2022-01-22 ENCOUNTER — Ambulatory Visit (INDEPENDENT_AMBULATORY_CARE_PROVIDER_SITE_OTHER): Payer: Self-pay | Admitting: Family Medicine

## 2022-01-22 ENCOUNTER — Other Ambulatory Visit (HOSPITAL_COMMUNITY): Payer: Self-pay

## 2022-01-22 VITALS — BP 140/86 | HR 82 | Temp 97.6°F | Ht 62.25 in | Wt 170.4 lb

## 2022-01-22 DIAGNOSIS — E039 Hypothyroidism, unspecified: Secondary | ICD-10-CM

## 2022-01-22 DIAGNOSIS — M79672 Pain in left foot: Secondary | ICD-10-CM

## 2022-01-22 DIAGNOSIS — E785 Hyperlipidemia, unspecified: Secondary | ICD-10-CM

## 2022-01-22 DIAGNOSIS — I10 Essential (primary) hypertension: Secondary | ICD-10-CM

## 2022-01-22 DIAGNOSIS — M722 Plantar fascial fibromatosis: Secondary | ICD-10-CM | POA: Insufficient documentation

## 2022-01-22 DIAGNOSIS — E118 Type 2 diabetes mellitus with unspecified complications: Secondary | ICD-10-CM

## 2022-01-22 MED ORDER — TRAMADOL HCL 50 MG PO TABS
50.0000 mg | ORAL_TABLET | Freq: Two times a day (BID) | ORAL | 0 refills | Status: DC | PRN
  Filled 2022-01-22 – 2022-04-24 (×2): qty 20, 10d supply, fill #0

## 2022-01-22 MED ORDER — METFORMIN HCL ER 500 MG PO TB24
500.0000 mg | ORAL_TABLET | Freq: Every day | ORAL | 3 refills | Status: DC
  Filled 2022-01-22 – 2022-02-02 (×2): qty 90, 90d supply, fill #0

## 2022-01-22 MED ORDER — MONTELUKAST SODIUM 10 MG PO TABS
ORAL_TABLET | Freq: Every day | ORAL | 3 refills | Status: DC
  Filled 2022-01-22 – 2022-02-02 (×2): qty 90, 90d supply, fill #0
  Filled 2022-05-18: qty 90, 90d supply, fill #1

## 2022-01-22 MED ORDER — ALBUTEROL SULFATE HFA 108 (90 BASE) MCG/ACT IN AERS
2.0000 | INHALATION_SPRAY | Freq: Four times a day (QID) | RESPIRATORY_TRACT | 3 refills | Status: DC | PRN
  Filled 2022-01-22: qty 18, 25d supply, fill #0
  Filled 2022-02-02: qty 6.7, 25d supply, fill #0
  Filled 2022-09-25: qty 6.7, 25d supply, fill #1

## 2022-01-22 MED ORDER — LEVOTHYROXINE SODIUM 175 MCG PO TABS
175.0000 ug | ORAL_TABLET | Freq: Every morning | ORAL | 0 refills | Status: DC
Start: 2022-01-22 — End: 2022-02-09
  Filled 2022-01-22 – 2022-02-02 (×2): qty 90, 90d supply, fill #0

## 2022-01-22 MED ORDER — LISINOPRIL-HYDROCHLOROTHIAZIDE 20-25 MG PO TABS
1.0000 | ORAL_TABLET | Freq: Every day | ORAL | 3 refills | Status: DC
  Filled 2022-01-22: qty 90, 90d supply, fill #0
  Filled 2022-01-22: qty 44, 44d supply, fill #0
  Filled 2022-02-02: qty 90, 90d supply, fill #0
  Filled 2022-05-18: qty 90, 90d supply, fill #1
  Filled 2022-08-08: qty 90, 90d supply, fill #2
  Filled 2022-12-05: qty 90, 90d supply, fill #3

## 2022-01-22 MED ORDER — ATORVASTATIN CALCIUM 40 MG PO TABS
40.0000 mg | ORAL_TABLET | Freq: Every day | ORAL | 3 refills | Status: DC
  Filled 2022-01-22 – 2022-02-02 (×2): qty 90, 90d supply, fill #0
  Filled 2022-05-18: qty 90, 90d supply, fill #1

## 2022-01-22 NOTE — Assessment & Plan Note (Signed)
Update A1c next month when she returns for labs. She continues metformin XR once daily.

## 2022-01-22 NOTE — Progress Notes (Signed)
Patient ID: [name], female    DOB: 1970/09/15, 51 y.o.   MRN: [id number]  This visit was conducted in person.  BP 140/86   Pulse 82   Temp 97.6 F (36.4 C) (Temporal)   Ht 5' 2.25" (1.581 m)   Wt 170 lb 6 oz (77.3 kg)   LMP 11/12/2011   SpO2 97%   BMI 30.91 kg/m    CC: L foot pain at heel  Subjective:   HPI: [name] is a 51 y.o. female presenting on 01/22/2022 for Foot Pain (C/o sever L foot pain in the heel. Started about 5 mos ago, worsened in past 2 mos. )   Self pay patient. Correct name is [name]   76moh/o L heel pain, acutely worse in the past 2 months.  Denies inciting trauma/injury or fall. No burning pain or numbness.  Walking with a limp, now having L popliteal tenderness which she attributes to favoring that leg.   She's been taking tylenol '1000mg'$  2-3 times a day without much benefit.  She's been using ice, tennisball rolling, voltaren without benefit.      Relevant past medical, surgical, family and social history reviewed and updated as indicated. Interim medical history since our last visit reviewed. Allergies and medications reviewed and updated. Outpatient Medications Prior to Visit  Medication Sig Dispense Refill   acetaminophen (TYLENOL) 500 MG tablet Take 1,000 mg by mouth as needed for moderate pain.      aspirin 81 MG tablet Take 1 tablet (81 mg total) by mouth daily.     Calcium Citrate-Vitamin D (CALCIUM + D PO) Take 1 tablet by mouth daily.      fluticasone (FLONASE) 50 MCG/ACT nasal spray Place 2 sprays into both nostrils daily. 16 mL 0   vitamin E 180 MG (400 UNITS) capsule Take 400 Units by mouth daily.     albuterol (VENTOLIN HFA) 108 (90 Base) MCG/ACT inhaler INHALE 2 PUFFS INTO THE LUNGS EVERY 6 HOURS AS NEEDED FOR WHEEZING OR SHORTNESS OF BREATH 18 g 3   atorvastatin (LIPITOR) 40 MG tablet Take 1 tablet (40 mg total) by mouth daily. 90 tablet 3   azithromycin (ZITHROMAX  Z-PAK) 250 MG tablet Take 2 tablets by mouth today, then 1 tablet daily for 4 days 6 tablet 0   benzonatate (TESSALON PERLES) 100 MG capsule Take 1 capsule (100 mg total) by mouth 3 (three) times daily as needed. 20 capsule 0   HYDROcodone bit-homatropine (HYCODAN) 5-1.5 MG/5ML syrup Take 5 mLs by mouth every 6 (six) hours as needed for cough. 120 mL 0   HYDROcodone bit-homatropine (HYCODAN) 5-1.5 MG/5ML syrup Take 5 mLs by mouth every 6 hours as needed for cough. 120 mL 0   levothyroxine (SYNTHROID) 175 MCG tablet Take 1 tablet (175 mcg total) by mouth every morning. NEEDS PHYSICAL BEFORE NEXT REFILL 90 tablet 0   lisinopril-hydrochlorothiazide (ZESTORETIC) 20-25 MG tablet Take 1 tablet by mouth once daily 90 tablet 3   metFORMIN (GLUCOPHAGE XR) 500 MG 24 hr tablet Take 1 tablet by mouth daily with breakfast. 90 tablet 3   methylPREDNISolone (MEDROL DOSEPAK) 4 MG TBPK tablet Take as directed 21 tablet 0   montelukast (SINGULAIR) 10 MG tablet TAKE 1 TABLET BY MOUTH EVERY NIGHT AT BEDTIME 90 tablet 1   No facility-administered medications prior to visit.     Per HPI unless specifically indicated in ROS section below Review of Systems  Objective:  BP  140/86   Pulse 82   Temp 97.6 F (36.4 C) (Temporal)   Ht 5' 2.25" (1.581 m)   Wt 170 lb 6 oz (77.3 kg)   LMP 11/12/2011   SpO2 97%   BMI 30.91 kg/m   Wt Readings from Last 3 Encounters:  01/22/22 170 lb 6 oz (77.3 kg)  05/16/21 173 lb 9.6 oz (78.7 kg)  12/07/20 167 lb 8 oz (76 kg)      Physical Exam Vitals and nursing note reviewed.  Constitutional:      Appearance: Normal appearance. She is not ill-appearing.     Comments: Uncomfortable appearing  Musculoskeletal:        General: Tenderness present. No swelling or deformity. Normal range of motion.     Right lower leg: No edema.     Left lower leg: No edema.     Comments:  2+ DP bilaterally R foot WNL L foot: No ligament laxity, no pain at achilles tendon  No pain at  navicular or at base of 5th MT No pain at malleoli bilaterally Marked discomfort to palpation at sole of heel to midfoot sole  Skin:    General: Skin is warm and dry.     Findings: No rash.  Neurological:     Mental Status: She is alert.  Psychiatric:        Mood and Affect: Mood normal.        Behavior: Behavior normal.      Results for orders placed or performed during the hospital encounter of 08/15/21  SARS CORONAVIRUS 2 (TAT 6-24 HRS) Nasopharyngeal Nasopharyngeal Swab   Specimen: Nasopharyngeal Swab  Result Value Ref Range   SARS Coronavirus 2 NEGATIVE NEGATIVE  POC Influenza A & B Ag (Urgent Care)  Result Value Ref Range   INFLUENZA A ANTIGEN, POC NEGATIVE NEGATIVE   INFLUENZA B ANTIGEN, POC NEGATIVE NEGATIVE   Lab Results  Component Value Date   TSH 1.95 12/05/2020    Lab Results  Component Value Date   HGBA1C 7.1 (H) 12/05/2020    Assessment & Plan:   Problem List Items Addressed This Visit     Hypothyroidism    Update TSH when she returns for labs early June. Levothyroxine refilled.        Relevant Medications   levothyroxine (SYNTHROID) 175 MCG tablet   Other Relevant Orders   TSH   Dyslipidemia    She's been off atorvastatin - advised restart this, med refilled.        Relevant Medications   atorvastatin (LIPITOR) 40 MG tablet   HYPERTENSION, BENIGN SYSTEMIC    Chronic, stable period on zestoretic - continue this, refilled.        Relevant Medications   atorvastatin (LIPITOR) 40 MG tablet   lisinopril-hydrochlorothiazide (ZESTORETIC) 20-25 MG tablet   Controlled diabetes mellitus type 2 with complications ([place])    Update A1c next month when she returns for labs. She continues metformin XR once daily.        Relevant Medications   atorvastatin (LIPITOR) 40 MG tablet   lisinopril-hydrochlorothiazide (ZESTORETIC) 20-25 MG tablet   metFORMIN (GLUCOPHAGE XR) 500 MG 24 hr tablet   Other Relevant Orders   Hemoglobin A1c   Left foot pain -  Primary    Story/exam most consistent with L heel plantar fasciitis, r/o heel spur.  So far has failed conservative treatment tried.  NSAID contraindicated in h/o HTN and stroke.  She's already using high dose tylenol with poor relief.  Discussed other  PF treatment including frozen water bottle massage nightly, gentle stretching with towel, using heel lifts in shoes. Will refer to podiatry for further evaluation of poorly responding plantar fasciitis.  Rx tramadol for breakthrough pain.        Relevant Orders   Ambulatory referral to Podiatry     Meds ordered this encounter  Medications   albuterol (VENTOLIN HFA) 108 (90 Base) MCG/ACT inhaler    Sig: INHALE 2 PUFFS INTO THE LUNGS EVERY 6 HOURS AS NEEDED FOR WHEEZING OR SHORTNESS OF BREATH    Dispense:  18 g    Refill:  3   atorvastatin (LIPITOR) 40 MG tablet    Sig: Take 1 tablet (40 mg total) by mouth daily.    Dispense:  90 tablet    Refill:  3   montelukast (SINGULAIR) 10 MG tablet    Sig: TAKE 1 TABLET BY MOUTH EVERY NIGHT AT BEDTIME    Dispense:  90 tablet    Refill:  3   levothyroxine (SYNTHROID) 175 MCG tablet    Sig: Take 1 tablet (175 mcg total) by mouth every morning. NEEDS PHYSICAL BEFORE NEXT REFILL    Dispense:  90 tablet    Refill:  0   lisinopril-hydrochlorothiazide (ZESTORETIC) 20-25 MG tablet    Sig: Take 1 tablet by mouth daily.    Dispense:  90 tablet    Refill:  3   metFORMIN (GLUCOPHAGE XR) 500 MG 24 hr tablet    Sig: Take 1 tablet by mouth daily with breakfast.    Dispense:  90 tablet    Refill:  3   traMADol (ULTRAM) 50 MG tablet    Sig: Take 1 tablet (50 mg total) by mouth 2 (two) times daily as needed for up to 10 days for moderate pain (sedation precautions).    Dispense:  20 tablet    Refill:  0   Orders Placed This Encounter  Procedures   TSH    Standing Status:   Future    Standing Expiration Date:   01/23/2023   Hemoglobin A1c    Standing Status:   Future    Standing Expiration Date:    01/23/2023   Ambulatory referral to Podiatry    Referral Priority:   Routine    Referral Type:   Consultation    Referral Reason:   Specialty Services Required    Requested Specialty:   Podiatry    Number of Visits Requested:   1     Patient Instructions  He rellenado medicamentos hoy. Haga cita para laboratorios para Junio. Haga cita para 6 meses para fisico.  Use botella de agua congelada cada noche y ruede esta debajo del pie.  Trate "gel insert heel lift" puede comprar en cualquier farmacia Estiramiento regular 1-2 veces al dia con toalla.  La remitiremos a podiatra para evaluacion [phone].   Fascitis plantar Plantar Fasciitis  La fascitis plantar es una afeccin dolorosa que se produce en el taln. Ocurre cuando la banda de tejido que [employer] dedos con el hueso del taln (fascia plantar) se irrita. Esto puede ocurrir por Financial planner ejercicio u otras actividades repetitivas (lesin por uso excesivo). La fascitis plantar puede causar desde una leve irritacin hasta dolor intenso que dificulta que la persona camine o se [place]. Por lo general, el dolor es peor a la maana despus de dormir, o despus de Public affairs consultant sentado o acostado durante un perodo de [place]. El dolor tambin puede empeorar despus de caminar o estar de pie  por [employer]. Cules son las causas? Esta afeccin puede ser causada por lo siguiente: Estar de pie durante largos perodos. Usar zapatos que no tengan un buen soporte para el arco. Realizar actividades que implican esfuerzo para las articulaciones (actividades de alto impacto). Esto incluye el ballet y la actividad fsica que hace que el corazn lata ms rpido (ejercicio aerbico), Solicitor. Tener sobrepeso. Tener una forma de caminar (andar) anormal. Presentar rigidez muscular en la parte posterior de la parte inferior de la pierna (pantorrilla). Arcos [employer] o pies planos. Comenzar una nueva actividad fsica. Cules son los  signos o sntomas? El sntoma principal de esta afeccin es el dolor en el taln. El dolor puede empeorar despus de lo siguiente: Con los primeros pasos luego de estar en reposo, especialmente por la maana despus de dormir o de haber estado sentado o acostado durante un [place]. Largos perodos de Public affairs consultant de pie. El dolor puede disminuir despus de 30 a 45 minutos de [place], como caminar apaciblemente. Cmo se diagnostica? Esta afeccin se puede diagnosticar en funcin de los antecedentes mdicos, un examen fsico y los sntomas. El mdico controlar lo siguiente: Un rea dolorida en la parte inferior del pie. Arco alto en el pie o pies planos. Dolor al Agricultural consultant. Dificultad para mover el pie. Pueden realizarle estudios de diagnstico por imagen para confirmar el diagnstico, por ejemplo: Radiografas. Ecografa. Resonancia magntica (RM). Cmo se trata? El tratamiento de la fascitis plantar depende de la gravedad de su afeccin. El tratamiento puede incluir: Reposo, hielo, presin (compresin) y Lexicographer (elevar) el pie afectado. Esto se denomina tratamiento de RHCE (reposo, hielo, compresin, elevacin). El mdico puede recomendarle terapia de RHCE junto con medicamentos de venta libre para Best boy. Ejercicios para [place] pantorrillas y la fascia plantar. Una frula que [employer] estirado y [place] mientras usted duerme (frula nocturna). Fisioterapia para [employer] sntomas y Customer service manager en el futuro. Inyecciones de medicamentos con corticoesteroides (cortisona) para Best boy y la inflamacin. Estimular su fascia plantar lesionada con impulsos elctricos (tratamiento con ondas de choque extracorpreas). Esto generalmente es la ltima opcin de tratamiento antes de la [place]. [place], si los otros tratamientos no han funcionado despus de 12 meses. Siga estas instrucciones en su casa: Control del dolor, la rigidez y la hinchazn  Si  se lo indican, aplique hielo sobre la zona dolorida. Para hacer esto: Ponga el hielo en una bolsa de plstico o use una botella de [place]. Coloque una toalla entre la piel y la bolsa de hielo o la botella. Frote la parte inferior del pie sobre la bolsa o la botella. Haga esto durante 20 minutos, de 2 a 3 veces al [employer]. Use calzado deportivo con amortiguacin de aire o gel, o pruebe usar plantillas blandas diseadas para la fascitis plantar. Cuando est sentado o acostado, eleve el pie por encima del nivel del corazn. Actividad Evite las actividades que le causan dolor. Pregntele al mdico qu actividades son seguras para usted. Haga los ejercicios de fisioterapia y estiramiento como se lo haya indicado el mdico. Intente hacer actividades y tipos de ejercicio que sean ms suaves para las articulaciones (de bajo impacto). Por ejemplo, nadar, hacer ejercicios [employer], y Catering manager. Instrucciones generales Use los medicamentos de venta libre y los recetados solamente como se lo haya indicado el mdico. Si el mdico se lo indica, use una frula nocturna para dormir. Afloje la frula si los  dedos de los pies se le entumecen, siente hormigueos o se le enfran y se tornan de Optician, dispensing. Mantenga un peso saludable, o colabore con su mdico para perder [employer]. Cumpla con todas las visitas de seguimiento. Esto es importante. Comunquese con un mdico si tiene: Sntomas que no desaparecen con Teacher, early years/pre. Dolor que [place]. Dolor que afecta su capacidad de moverse o de Optometrist sus actividades diarias. Resumen La fascitis plantar es una afeccin dolorosa que se produce en el taln. Ocurre cuando la banda de tejido que [employer] dedos con el hueso del taln (fascia plantar) se irrita. El dolor en el taln es el sntoma principal de esta afeccin. Puede empeorar despus de Buyer, retail ejercicio o de Public affairs consultant quieto de pie durante mucho tiempo. El tratamiento  vara, pero normalmente comienza con el reposo, la aplicacin de hielo, la aplicacin de presin (compresin) y la elevacin del pie afectado. Esto se denomina tratamiento de RHCE (reposo, hielo, compresin, elevacin). Tambin se pueden usar analgsicos de venta libre para Financial controller. Esta informacin no tiene Marine scientist el consejo del mdico. Asegrese de hacerle al mdico cualquier pregunta que tenga. Document Revised: 02/22/2020 Document Reviewed: 02/22/2020 Elsevier Patient Education  [place].  He rellenado medicamentos hoy. Haga cita para laboratorios para Junio. Haga cita para 6 meses para fisico.  Use botella de agua congelada cada noche y ruede esta debajo del pie.  Trate "gel insert heel lift" puede comprar en cualquier farmacia Estiramiento regular 1-2 veces al dia con toalla.  La remitiremos a podiatra para evaluacion [phone].   Follow up plan: Return in about 6 months (around 07/25/2022) for annual exam, prior fasting for blood work.  Ria Bush, MD

## 2022-01-22 NOTE — Assessment & Plan Note (Signed)
She's been off atorvastatin - advised restart this, med refilled.

## 2022-01-22 NOTE — Assessment & Plan Note (Signed)
Chronic, stable period on zestoretic - continue this, refilled.

## 2022-01-22 NOTE — Assessment & Plan Note (Signed)
Story/exam most consistent with L heel plantar fasciitis, r/o heel spur.  So far has failed conservative treatment tried.  NSAID contraindicated in h/o HTN and stroke.  She's already using high dose tylenol with poor relief.  Discussed other PF treatment including frozen water bottle massage nightly, gentle stretching with towel, using heel lifts in shoes. Will refer to podiatry for further evaluation of poorly responding plantar fasciitis.  Rx tramadol for breakthrough pain.

## 2022-01-22 NOTE — Patient Instructions (Addendum)
He rellenado medicamentos hoy. Haga cita para laboratorios para Junio. Haga cita para 6 meses para fisico.  Use botella de agua congelada cada noche y ruede esta debajo del pie.  Trate "gel insert heel lift" puede comprar en cualquier farmacia Estiramiento regular 1-2 veces al dia con toalla.  La remitiremos a podiatra para evaluacion [phone].   Fascitis plantar Plantar Fasciitis  La fascitis plantar es una afeccin dolorosa que se produce en el taln. Ocurre cuando la banda de tejido que [employer] dedos con el hueso del taln (fascia plantar) se irrita. Esto puede ocurrir por Financial planner ejercicio u otras actividades repetitivas (lesin por uso excesivo). La fascitis plantar puede causar desde una leve irritacin hasta dolor intenso que dificulta que la persona camine o se [place]. Por lo general, el dolor es peor a la maana despus de dormir, o despus de Public affairs consultant sentado o acostado durante un perodo de [place]. El dolor tambin puede empeorar despus de caminar o estar de pie por [employer]. Cules son las causas? Esta afeccin puede ser causada por lo siguiente: Estar de pie durante largos perodos. Usar zapatos que no tengan un buen soporte para el arco. Realizar actividades que implican esfuerzo para las articulaciones (actividades de alto impacto). Esto incluye el ballet y la actividad fsica que hace que el corazn lata ms rpido (ejercicio aerbico), Solicitor. Tener sobrepeso. Tener una forma de caminar (andar) anormal. Presentar rigidez muscular en la parte posterior de la parte inferior de la pierna (pantorrilla). Arcos [employer] o pies planos. Comenzar una nueva actividad fsica. Cules son los signos o sntomas? El sntoma principal de esta afeccin es el dolor en el taln. El dolor puede empeorar despus de lo siguiente: Con los primeros pasos luego de estar en reposo, especialmente por la maana despus de dormir o de haber estado sentado o acostado  durante un [place]. Largos perodos de Public affairs consultant de pie. El dolor puede disminuir despus de 30 a 45 minutos de [place], como caminar apaciblemente. Cmo se diagnostica? Esta afeccin se puede diagnosticar en funcin de los antecedentes mdicos, un examen fsico y los sntomas. El mdico controlar lo siguiente: Un rea dolorida en la parte inferior del pie. [place] alto en el pie o pies planos. Dolor al Agricultural consultant. Dificultad para mover el pie. Pueden realizarle estudios de diagnstico por imagen para confirmar el diagnstico, por ejemplo: Radiografas. Ecografa. Resonancia magntica (RM). Cmo se trata? El tratamiento de la fascitis plantar depende de la gravedad de su afeccin. El tratamiento puede incluir: Reposo, hielo, presin (compresin) y Lexicographer (elevar) el pie afectado. Esto se denomina tratamiento de RHCE (reposo, hielo, compresin, elevacin). El mdico puede recomendarle terapia de RHCE junto con medicamentos de venta libre para Best boy. Ejercicios para [place] pantorrillas y la fascia plantar. Una frula que [employer] estirado y [place] mientras usted duerme (frula nocturna). Fisioterapia para [employer] sntomas y Customer service manager en el futuro. Inyecciones de medicamentos con corticoesteroides (cortisona) para Best boy y la inflamacin. Estimular su fascia plantar lesionada con impulsos elctricos (tratamiento con ondas de choque extracorpreas). Esto generalmente es la ltima opcin de tratamiento antes de la [place]. [place], si los otros tratamientos no han funcionado despus de 12 meses. Siga estas instrucciones en su casa: Control del dolor, la rigidez y la hinchazn  Si se lo indican, aplique hielo sobre la zona dolorida. Para hacer esto: Ponga el hielo en una bolsa de plstico o use una botella de  [place] congelada. Coloque una toalla entre la piel y la bolsa de hielo o la botella. Frote la parte inferior del pie sobre la bolsa o  la botella. Haga esto durante 20 minutos, de 2 a 3 veces al [employer]. Use calzado deportivo con amortiguacin de aire o gel, o pruebe usar plantillas blandas diseadas para la fascitis plantar. Cuando est sentado o acostado, eleve el pie por encima del nivel del corazn. Actividad Evite las actividades que le causan dolor. Pregntele al mdico qu actividades son seguras para usted. Haga los ejercicios de fisioterapia y estiramiento como se lo haya indicado el mdico. Intente hacer actividades y tipos de ejercicio que sean ms suaves para las articulaciones (de bajo impacto). Por ejemplo, nadar, hacer ejercicios [employer], y Catering manager. Instrucciones generales Use los medicamentos de venta libre y los recetados solamente como se lo haya indicado el mdico. Si el mdico se lo indica, use una frula nocturna para dormir. Afloje la frula si los dedos de los pies se le entumecen, siente hormigueos o se le enfran y se tornan de Optician, dispensing. Mantenga un peso saludable, o colabore con su mdico para perder [employer]. Cumpla con todas las visitas de seguimiento. Esto es importante. Comunquese con un mdico si tiene: Sntomas que no desaparecen con Teacher, early years/pre. Dolor que [place]. Dolor que afecta su capacidad de moverse o de Optometrist sus actividades diarias. Resumen La fascitis plantar es una afeccin dolorosa que se produce en el taln. Ocurre cuando la banda de tejido que [employer] dedos con el hueso del taln (fascia plantar) se irrita. El dolor en el taln es el sntoma principal de esta afeccin. Puede empeorar despus de Buyer, retail ejercicio o de Public affairs consultant quieto de pie durante mucho tiempo. El tratamiento vara, pero normalmente comienza con el reposo, la aplicacin de hielo, la aplicacin de presin (compresin) y la elevacin del pie afectado. Esto se denomina tratamiento de RHCE (reposo, hielo, compresin, elevacin). Tambin se pueden usar analgsicos de venta  libre para Financial controller. Esta informacin no tiene Marine scientist el consejo del mdico. Asegrese de hacerle al mdico cualquier pregunta que tenga. Document Revised: 02/22/2020 Document Reviewed: 02/22/2020 Elsevier Patient Education  [place].

## 2022-01-22 NOTE — Assessment & Plan Note (Signed)
Update TSH when she returns for labs early June. Levothyroxine refilled.

## 2022-02-01 ENCOUNTER — Encounter: Payer: Self-pay | Admitting: Podiatry

## 2022-02-01 ENCOUNTER — Other Ambulatory Visit (HOSPITAL_COMMUNITY): Payer: Self-pay

## 2022-02-01 ENCOUNTER — Other Ambulatory Visit: Payer: Self-pay | Admitting: Podiatry

## 2022-02-01 ENCOUNTER — Ambulatory Visit (INDEPENDENT_AMBULATORY_CARE_PROVIDER_SITE_OTHER): Payer: PRIVATE HEALTH INSURANCE | Admitting: Podiatry

## 2022-02-01 ENCOUNTER — Ambulatory Visit (INDEPENDENT_AMBULATORY_CARE_PROVIDER_SITE_OTHER): Payer: Self-pay

## 2022-02-01 DIAGNOSIS — M722 Plantar fascial fibromatosis: Secondary | ICD-10-CM | POA: Diagnosis not present

## 2022-02-01 DIAGNOSIS — R7303 Prediabetes: Secondary | ICD-10-CM | POA: Insufficient documentation

## 2022-02-01 DIAGNOSIS — M778 Other enthesopathies, not elsewhere classified: Secondary | ICD-10-CM

## 2022-02-01 DIAGNOSIS — I10 Essential (primary) hypertension: Secondary | ICD-10-CM | POA: Insufficient documentation

## 2022-02-01 MED ORDER — METHYLPREDNISOLONE 4 MG PO TBPK
ORAL_TABLET | ORAL | 0 refills | Status: DC
  Filled 2022-02-01: qty 21, 6d supply, fill #0

## 2022-02-01 MED ORDER — TRIAMCINOLONE ACETONIDE 40 MG/ML IJ SUSP
20.0000 mg | Freq: Once | INTRAMUSCULAR | Status: AC
  Administered 2022-02-01: 20 mg

## 2022-02-01 NOTE — Patient Instructions (Signed)

## 2022-02-02 ENCOUNTER — Other Ambulatory Visit (INDEPENDENT_AMBULATORY_CARE_PROVIDER_SITE_OTHER): Payer: PRIVATE HEALTH INSURANCE

## 2022-02-02 ENCOUNTER — Other Ambulatory Visit (HOSPITAL_COMMUNITY): Payer: Self-pay

## 2022-02-02 DIAGNOSIS — E118 Type 2 diabetes mellitus with unspecified complications: Secondary | ICD-10-CM

## 2022-02-02 DIAGNOSIS — E039 Hypothyroidism, unspecified: Secondary | ICD-10-CM

## 2022-02-02 LAB — HEMOGLOBIN A1C: Hgb A1c MFr Bld: 7.7 % — ABNORMAL HIGH (ref 4.6–6.5)

## 2022-02-02 LAB — TSH: TSH: 0.33 u[IU]/mL — ABNORMAL LOW (ref 0.35–5.50)

## 2022-02-03 NOTE — Progress Notes (Signed)
Subjective:  Patient ID: [name], female    DOB: 1971/07/03,  MRN: [id number] HPI Chief Complaint  Patient presents with   Foot Pain    Plantar heel left - aching x 6 months, AM pain, tried Tylenol and rolling ice bottle-no help   Diabetes    Last a1c was 7.1   New Patient (Initial Visit)    51 y.o. female presents with the above complaint.   ROS: Denies fever chills nausea vomiting muscle aches pains calf pain back pain chest pain shortness of breath.  Past Medical History:  Diagnosis Date   Bilateral ovarian cysts    Chronic pelvic pain in female    Complication of anesthesia    Hemorrhoids    Hepatic steatosis 07/2006, 04/2014   on Abd CT done for pain   History of anal fissures    History of CVA (cerebrovascular accident) without residual deficits followed by pcp--- takes asa '81mg'$    04/ 2008--- due to cerebrovascular occlusion,  left basal ganglia subacute infarct;   per pt no residuals   History of ectopic pregnancy    2007--  treated with medication   History of hyperthyroidism    s/p  RAI   03-25-2001   Hydrosalpinx    Hypertension    followed by pcp   Hypothyroidism, postradioiodine therapy    followed by pcp---  multinodular goiter;   s/p  RAI 03-25-2001 for hyperthyroidism   PCOS (polycystic ovarian syndrome)    Pelvic adhesions    PONV (postoperative nausea and vomiting)    Seasonal asthma    followed by pcp--- no inhaler, takes singulair   Type II diabetes mellitus ([place]) dx'd 12/2015   followed by pcp ---   (11-06-2019  per pt does not check blood sugar at home)   Past Surgical History:  Procedure Laterality Date   ABDOMINAL HYSTERECTOMY  04/24/2012   Procedure: HYSTERECTOMY ABDOMINAL;  Surgeon: Terrance Mass, MD;  Location: [facility];  Service: Gynecology;;   ANAL SPHINCTEROTOMY  05-08-2019    '@[facility]   ruptured   CERVICAL CERCLAGE  11-06-2008;  10-07-2010  '@[facility]'$    CESAREAN SECTION W/BTL Bilateral 12-24-2010   '@[facility]'$    bilateral  tubal ligation with filshie clips   COLONOSCOPY  last one 05-08-2019  [facility]   anal fissure, polyps (TA, HP), rpt 7 yrs ([facility]herty @ [facility])   ESOPHAGOGASTRODUODENOSCOPY  10/2014   WNL   EXCISION OF ENDOMETRIOMA N/A 08/20/2016   Procedure: EXCISION OF ENDOMETRIOMA LEFT LOWER QUADRANT;  Surgeon: Georganna Skeans, MD;  Location: [facility];  Service: General;  Laterality: N/A;   EXPLORATORY LAPAROTOMY  03-03-2004    '@[facility]    WITH LYSIS ADHESIONS   FLEXIBLE SIGMOIDOSCOPY  03/12/2019    [facility]   s/p botox injection for anal fissure ([facility] @ [facility])   LAPAROSCOPIC CHOLECYSTECTOMY  09/1995   LAPAROSCOPIC HYSTERECTOMY  04/24/2012   Procedure: HYSTERECTOMY TOTAL LAPAROSCOPIC;  Surgeon: Terrance Mass, MD;  Location: [facility];  Service: Gynecology;  Laterality: N/A;  2 1/2 hours OR time.  Dr. Phineas Real to [place] OOPHERECTOMY Bilateral 11/11/2019   diagnostic laparoscopy unsuccessful - extensive adhesions, unable to visualize adnexa (Anyanwu, Sallyanne Havers, MD)    Current Outpatient Medications:    methylPREDNISolone (MEDROL DOSEPAK) 4 MG TBPK tablet, Take as directd on package-6 day taper, Disp: 21 tablet, Rfl: 0   acetaminophen (TYLENOL) 500 MG tablet, Take 1,000 mg  by mouth as needed for moderate pain. , Disp: , Rfl:    albuterol (VENTOLIN HFA) 108 (90 Base) MCG/ACT inhaler, INHALE 2 PUFFS INTO THE LUNGS EVERY 6 HOURS AS NEEDED FOR WHEEZING OR SHORTNESS OF BREATH, Disp: 6.7 g, Rfl: 3   aspirin 81 MG tablet, Take 1 tablet (81 mg total) by mouth daily., Disp: , Rfl:    atorvastatin (LIPITOR) 40 MG tablet, Take 1 tablet (40 mg total) by mouth daily., Disp: 90 tablet, Rfl: 3   Calcium Citrate-Vitamin D (CALCIUM + D PO), Take 1 tablet by mouth daily. , Disp: , Rfl:    fluticasone (FLONASE) 50 MCG/ACT nasal spray, Place 2 sprays into both nostrils daily., Disp: 16 mL, Rfl: 0   levothyroxine (SYNTHROID) 175 MCG tablet, Take 1  tablet by mouth every morning. NEEDS PHYSICAL BEFORE NEXT REFILL, Disp: 90 tablet, Rfl: 0   lisinopril-hydrochlorothiazide (ZESTORETIC) 20-25 MG tablet, Take 1 tablet by mouth daily., Disp: 90 tablet, Rfl: 3   metFORMIN (GLUCOPHAGE-XR) 500 MG 24 hr tablet, Take 1 tablet by mouth daily with breakfast., Disp: 90 tablet, Rfl: 3   montelukast (SINGULAIR) 10 MG tablet, TAKE 1 TABLET BY MOUTH EVERY NIGHT AT BEDTIME, Disp: 90 tablet, Rfl: 3   vitamin E 180 MG (400 UNITS) capsule, Take 400 Units by mouth daily., Disp: , Rfl:   Allergies  Allergen Reactions   Covid-19 (Mrna) Vaccine Therapist, music) [Covid-19 (Mrna) Vaccine] Hives    Hives after 2nd Pfizer COVID vaccine   Amoxicillin Rash   Voltaren [Diclofenac Sodium] Rash    Diffuse pruritic urticarial rash    Review of Systems Objective:  There were no vitals filed for this visit.  General: Well developed, nourished, in no acute distress, alert and oriented x3   Dermatological: Skin is warm, dry and supple bilateral. Nails x 10 are well maintained; remaining integument appears unremarkable at this time. There are no open sores, no preulcerative lesions, no rash or signs of infection present.  Vascular: Dorsalis Pedis artery and Posterior Tibial artery pedal pulses are 2/4 bilateral with immedate capillary fill time. Pedal hair growth present. No varicosities and no lower extremity edema present bilateral.   Neruologic: Grossly intact via light touch bilateral. Vibratory intact via tuning fork bilateral. Protective threshold with Semmes Wienstein monofilament intact to all pedal sites bilateral. Patellar and Achilles deep tendon reflexes 2+ bilateral. No Babinski or clonus noted bilateral.   Musculoskeletal: No gross boney pedal deformities bilateral. No pain, crepitus, or limitation noted with foot and ankle range of motion bilateral. Muscular strength 5/5 in all groups tested bilateral.  She has pain on palpation medial calcaneal tubercle of the left  heel.  No pain on medial-lateral compression of the calcaneus no pain on palpation of the Achilles.  Gait: Unassisted, Nonantalgic.    Radiographs:  Radiographs of the left foot demonstrate an osseously mature individual with a rectus foot type.  She does have small plantar and posterior calcaneal heel spurs with a soft tissue increase in density at the plantar fascial calcaneal insertion site.  Assessment & Plan:   Assessment: Fasciitis left.  Plan: Discussed etiology pathology conservative versus surgical therapies.  At this point I started her on meloxicam 15 mg 1 p.o. daily placed her in a plantar fascial brace and I injected her left heel.  I also discussed appropriate shoe gear stretching exercises and ice therapy.  I will follow-up with her in 1 month     Ashlley Booher T. [place], [place]

## 2022-02-09 ENCOUNTER — Ambulatory Visit (INDEPENDENT_AMBULATORY_CARE_PROVIDER_SITE_OTHER): Payer: PRIVATE HEALTH INSURANCE | Admitting: Family Medicine

## 2022-02-09 ENCOUNTER — Other Ambulatory Visit (HOSPITAL_COMMUNITY): Payer: Self-pay

## 2022-02-09 ENCOUNTER — Encounter: Payer: Self-pay | Admitting: Family Medicine

## 2022-02-09 VITALS — BP 136/78 | HR 89 | Temp 97.3°F | Ht 62.25 in | Wt 168.5 lb

## 2022-02-09 DIAGNOSIS — E669 Obesity, unspecified: Secondary | ICD-10-CM | POA: Diagnosis not present

## 2022-02-09 DIAGNOSIS — E1169 Type 2 diabetes mellitus with other specified complication: Secondary | ICD-10-CM

## 2022-02-09 DIAGNOSIS — Z0001 Encounter for general adult medical examination with abnormal findings: Secondary | ICD-10-CM | POA: Diagnosis not present

## 2022-02-09 DIAGNOSIS — I10 Essential (primary) hypertension: Secondary | ICD-10-CM

## 2022-02-09 DIAGNOSIS — E785 Hyperlipidemia, unspecified: Secondary | ICD-10-CM

## 2022-02-09 DIAGNOSIS — E039 Hypothyroidism, unspecified: Secondary | ICD-10-CM

## 2022-02-09 DIAGNOSIS — J4540 Moderate persistent asthma, uncomplicated: Secondary | ICD-10-CM

## 2022-02-09 DIAGNOSIS — M722 Plantar fascial fibromatosis: Secondary | ICD-10-CM

## 2022-02-09 DIAGNOSIS — Z8673 Personal history of transient ischemic attack (TIA), and cerebral infarction without residual deficits: Secondary | ICD-10-CM

## 2022-02-09 MED ORDER — LEVOTHYROXINE SODIUM 150 MCG PO TABS
150.0000 ug | ORAL_TABLET | Freq: Every morning | ORAL | 1 refills | Status: DC
  Filled 2022-02-09 – 2022-04-24 (×3): qty 90, 90d supply, fill #0
  Filled 2022-08-08: qty 30, 30d supply, fill #1
  Filled 2022-09-20: qty 30, 30d supply, fill #2
  Filled 2022-10-30: qty 30, 30d supply, fill #3

## 2022-02-09 MED ORDER — ASMANEX HFA 100 MCG/ACT IN AERO
1.0000 | INHALATION_SPRAY | Freq: Two times a day (BID) | RESPIRATORY_TRACT | 6 refills | Status: DC
  Filled 2022-02-09: qty 13, 30d supply, fill #0

## 2022-02-09 MED ORDER — METFORMIN HCL ER 500 MG PO TB24
1000.0000 mg | ORAL_TABLET | Freq: Every day | ORAL | 3 refills | Status: DC
  Filled 2022-02-09 – 2022-05-18 (×2): qty 180, 90d supply, fill #0

## 2022-02-09 NOTE — Assessment & Plan Note (Signed)
Notes worsening asthma control since COVID infection last year with recurrent symptoms during the day and nocturnal symptoms twice a week.  Recommend start daily controller inhaler-we will send in Asmanex to take 100 mcg 1 puff twice daily, continue Singulair and as needed albuterol inhaler.  Reassess troll at follow-up visit.

## 2022-02-09 NOTE — Assessment & Plan Note (Signed)
Chronic, stable on Zestoretic-continue.

## 2022-02-09 NOTE — Assessment & Plan Note (Signed)
She just recently restarted atorvastatin.  We will not check FLP at this time but rather in 4 to 6 weeks when she returns fasting for labs. The 10-year ASCVD risk score (Arnett DK, et al., 2019) is: 8.1%   Values used to calculate the score:     Age: 51 years     Sex: Female     Is Non-Hispanic African American: No     Diabetic: Yes     Tobacco smoker: No     Systolic Blood Pressure: 436 mmHg     Is BP treated: Yes     HDL Cholesterol: 31.6 mg/dL     Total Cholesterol: 211 mg/dL

## 2022-02-09 NOTE — Assessment & Plan Note (Signed)
Weight loss noted, congratulated.  She is motivated to continue healthy diet and lifestyle changes to affect sustainable weight loss.

## 2022-02-09 NOTE — Assessment & Plan Note (Signed)
Appreciate podiatry care, status post steroid injection and Medrol dose pack with significant improvement.  Plan follow-up later this month.

## 2022-02-09 NOTE — Assessment & Plan Note (Signed)
Preventative protocols reviewed and updated unless pt declined. Discussed healthy diet and lifestyle.  

## 2022-02-09 NOTE — Progress Notes (Signed)
Patient ID: [name], female    DOB: 07/15/1971, 51 y.o.   MRN: [id number]  This visit was conducted in person.  BP 136/78   Pulse 89   Temp (!) 97.3 F (36.3 C) (Temporal)   Ht 5' 2.25" (1.581 m)   Wt 168 lb 8 oz (76.4 kg)   LMP 11/12/2011   SpO2 97%   BMI 30.57 kg/m    CC: CPE Subjective:   HPI: [name] is a 51 y.o. female presenting on 02/09/2022 for Annual Exam   Saw podiatry last week for plantar fasciitis, s/p steroid injection and placed in brace, also started medrol dosepak. Notes significant benefit with this treatment.   Just recently restarted atorvastatin.  DM - continues metformin XR '500mg'$  daily.  Asthma - intermittent dyspnea with wheezing since COVID 2022. Using albuterol daily. 1-2 night time awakenings per week.   Preventative: COLONOSCOPY 10/2014 1 hyperplastic polyp, rpt 10 yrs [facility]) Colonoscopy 02/2019 - anal fissure, polyps (adenoma), rpt 7 yrs ([facility] @ [facility]) Well woman - s/p hysterectomy 2013 for heavy bleeding. No vaginal bleeding.  Mammogram 11/2021 - [facility]  Lung cancer screening - not eligible  Flu shot - intermittent COVID vaccine - [place] 12/2019, 01/2020, no booster Td 2003, Tdap 01/2017  Shingrix - discussed, will defer for now  Seat belt use discussed  Sunscreen use discussed. No changing moles on skin.  Non smoker  No alcohol  Dentist - seen 08/2021 Eye exam - due - will refer.    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 Interim medical history since our last visit reviewed. Allergies and medications reviewed and updated. Outpatient Medications Prior to Visit  Medication Sig Dispense Refill   acetaminophen (TYLENOL) 500 MG tablet Take 1,000  mg by mouth as needed for moderate pain.      albuterol (VENTOLIN HFA) 108 (90 Base) MCG/ACT inhaler INHALE 2 PUFFS INTO THE LUNGS EVERY 6 HOURS AS NEEDED FOR WHEEZING OR SHORTNESS OF BREATH 6.7 g 3   aspirin 81 MG tablet Take 1 tablet (81 mg total) by mouth daily.     atorvastatin (LIPITOR) 40 MG tablet Take 1 tablet (40 mg total) by mouth daily. 90 tablet 3   Calcium Citrate-Vitamin D (CALCIUM + D PO) Take 1 tablet by mouth daily.      fluticasone (FLONASE) 50 MCG/ACT nasal spray Place 2 sprays into both nostrils daily. 16 mL 0   lisinopril-hydrochlorothiazide (ZESTORETIC) 20-25 MG tablet Take 1 tablet by mouth daily. 90 tablet 3   montelukast (SINGULAIR) 10 MG tablet TAKE 1 TABLET BY MOUTH EVERY NIGHT AT BEDTIME 90 tablet 3   vitamin E 180 MG (400 UNITS) capsule Take 400 Units by mouth daily.     levothyroxine (SYNTHROID) 175 MCG tablet Take 1 tablet by mouth every morning. NEEDS PHYSICAL BEFORE NEXT REFILL 90 tablet 0   metFORMIN (GLUCOPHAGE-XR) 500 MG 24 hr tablet Take 1 tablet by mouth daily with breakfast. 90 tablet 3   methylPREDNISolone (MEDROL DOSEPAK) 4 MG TBPK tablet Take as directd on package-6 day taper 21 tablet 0   No facility-administered medications prior to visit.     Per HPI unless specifically indicated in ROS section below Review of Systems  Constitutional:  Negative  for activity change, appetite change, chills, fatigue, fever and unexpected weight change.  HENT:  Negative for hearing loss.   Eyes:  Positive for visual disturbance (blurry distant vision).  Respiratory:  Positive for cough, shortness of breath and wheezing. Negative for chest tightness.   Cardiovascular:  Negative for chest pain, palpitations and leg swelling.  Gastrointestinal:  Negative for abdominal distention, abdominal pain, blood in stool, constipation, diarrhea, nausea and vomiting.  Genitourinary:  Negative for difficulty urinating and hematuria.  Musculoskeletal:  Negative for arthralgias,  myalgias and neck pain.  Skin:  Negative for rash.  Neurological:  Negative for dizziness, seizures, syncope and headaches.  Hematological:  Negative for adenopathy. Does not bruise/bleed easily.  Psychiatric/Behavioral:  Negative for dysphoric mood. The patient is not nervous/anxious.     Objective:  BP 136/78   Pulse 89   Temp (!) 97.3 F (36.3 C) (Temporal)   Ht 5' 2.25" (1.581 m)   Wt 168 lb 8 oz (76.4 kg)   LMP 11/12/2011   SpO2 97%   BMI 30.57 kg/m   Wt Readings from Last 3 Encounters:  02/09/22 168 lb 8 oz (76.4 kg)  01/22/22 170 lb 6 oz (77.3 kg)  05/16/21 173 lb 9.6 oz (78.7 kg)      Physical Exam Vitals and nursing note reviewed.  Constitutional:      Appearance: Normal appearance. She is not ill-appearing.  HENT:     Head: Normocephalic and atraumatic.     Right Ear: Tympanic membrane, ear canal and external ear normal. There is no impacted cerumen.     Left Ear: Tympanic membrane, ear canal and external ear normal. There is no impacted cerumen.  Eyes:     General:        Right eye: No discharge.        Left eye: No discharge.     Extraocular Movements: Extraocular movements intact.     Conjunctiva/sclera: Conjunctivae normal.     Pupils: Pupils are equal, round, and reactive to light.  Neck:     Thyroid: No thyroid mass or thyromegaly.  Cardiovascular:     Rate and Rhythm: Normal rate and regular rhythm.     Pulses: Normal pulses.     Heart sounds: Normal heart sounds. No murmur heard. Pulmonary:     Effort: Pulmonary effort is normal. No respiratory distress.     Breath sounds: Normal breath sounds. No wheezing, rhonchi or rales.  Abdominal:     General: Bowel sounds are normal. There is no distension.     Palpations: Abdomen is soft. There is no mass.     Tenderness: There is no abdominal tenderness. There is no guarding or rebound.     Hernia: No hernia is present.  Musculoskeletal:     Cervical back: Normal range of motion and neck supple. No  rigidity.     Right lower leg: No edema.     Left lower leg: No edema.  Lymphadenopathy:     Cervical: No cervical adenopathy.  Skin:    General: Skin is warm and dry.     Findings: No rash.  Neurological:     General: No focal deficit present.     Mental Status: She is alert. Mental status is at baseline.  Psychiatric:        Mood and Affect: Mood normal.        Behavior: Behavior normal.       Results for orders placed or performed in visit on 02/02/22  Hemoglobin A1c  Result Value Ref Range   Hgb A1c MFr Bld 7.7 (H) 4.6 - 6.5 %  TSH  Result Value Ref Range   TSH 0.33 (L) 0.35 - 5.50 uIU/mL   Lab Results  Component Value Date   CHOL 211 (H) 12/05/2020   HDL 31.60 (L) 12/05/2020   LDLCALC 155 (H) 11/12/2013   LDLDIRECT 107.0 12/05/2020   TRIG (H) 12/05/2020    486.0 Triglyceride is over 400; calculations on Lipids are invalid.   CHOLHDL 7 12/05/2020   Lab Results  Component Value Date   CREATININE 0.84 12/05/2020   BUN 20 12/05/2020   NA 137 12/05/2020   K 4.6 12/05/2020   CL 99 12/05/2020   CO2 31 12/05/2020   Lab Results  Component Value Date   WBC 8.2 12/05/2020   HGB 14.2 12/05/2020   HCT 43.1 12/05/2020   MCV 89.4 12/05/2020   PLT 159.0 12/05/2020    Assessment & Plan:   Problem List Items Addressed This Visit     Encounter for well adult exam with abnormal findings - Primary (Chronic)    Preventative protocols reviewed and updated unless pt declined. Discussed healthy diet and lifestyle.       Hypothyroidism    TSH low - drop levothyroxine dose to 150 mcg daily, recheck thyroid function in 4 to 6 weeks when she returns for fasting labs.      Relevant Medications   levothyroxine (SYNTHROID) 150 MCG tablet   Other Relevant Orders   TSH   Dyslipidemia associated with type 2 diabetes mellitus ([place])    She just recently restarted atorvastatin.  We will not check FLP at this time but rather in 4 to 6 weeks when she returns fasting for labs. The  10-year ASCVD risk score (Arnett DK, et al., 2019) is: 8.1%   Values used to calculate the score:     Age: 59 years     Sex: Female     Is Non-Hispanic African American: No     Diabetic: Yes     Tobacco smoker: No     Systolic Blood Pressure: 924 mmHg     Is BP treated: Yes     HDL Cholesterol: 31.6 mg/dL     Total Cholesterol: 211 mg/dL      Relevant Medications   metFORMIN (GLUCOPHAGE-XR) 500 MG 24 hr tablet   Other Relevant Orders   Lipid panel   Comprehensive metabolic panel   Obesity, Class I, BMI 30-34.9    Weight loss noted, congratulated.  She is motivated to continue healthy diet and lifestyle changes to affect sustainable weight loss.      HYPERTENSION, BENIGN SYSTEMIC    Chronic, stable on Zestoretic-continue.      History of ischemic stroke    Remote history of left globus pallidus infarct.  Continue aspirin, statin.      Asthma    Notes worsening asthma control since COVID infection last year with recurrent symptoms during the day and nocturnal symptoms twice a week.  Recommend start daily controller inhaler-we will send in Asmanex to take 100 mcg 1 puff twice daily, continue Singulair and as needed albuterol inhaler.  Reassess troll at follow-up visit.      Relevant Medications   Mometasone Furoate (ASMANEX HFA) 100 MCG/ACT AERO   Type 2 diabetes mellitus with other specified complication ([place])    Referred to eye doctor for diabetic retinopathy screening. A1c continues deteriorating-increase metformin XR to 2 tablets daily for a total of  1000 mg daily. Return to clinic 3 months for diabetes recheck.      Relevant Medications   metFORMIN (GLUCOPHAGE-XR) 500 MG 24 hr tablet   Other Relevant Orders   Ambulatory referral to Ophthalmology   Microalbumin / creatinine urine ratio   Fructosamine   Plantar fasciitis of left foot    Appreciate podiatry care, status post steroid injection and Medrol dose pack with significant improvement.  Plan follow-up later this  month.        Meds ordered this encounter  Medications   Mometasone Furoate (ASMANEX HFA) 100 MCG/ACT AERO    Sig: Inhale 1 puff into the lungs in the morning and at bedtime.    Dispense:  13 g    Refill:  6   levothyroxine (SYNTHROID) 150 MCG tablet    Sig: Take 1 tablet by mouth every morning.    Dispense:  90 tablet    Refill:  1    Note new dose   metFORMIN (GLUCOPHAGE-XR) 500 MG 24 hr tablet    Sig: Take 2 tablets by mouth daily with breakfast.    Dispense:  180 tablet    Refill:  3   Orders Placed This Encounter  Procedures   Lipid panel    Standing Status:   Future    Standing Expiration Date:   02/10/2023   Comprehensive metabolic panel    Standing Status:   Future    Standing Expiration Date:   02/10/2023   TSH    Standing Status:   Future    Standing Expiration Date:   02/10/2023   Microalbumin / creatinine urine ratio    Standing Status:   Future    Standing Expiration Date:   02/10/2023   Fructosamine    Standing Status:   Future    Standing Expiration Date:   02/10/2023   Ambulatory referral to Ophthalmology    Referral Priority:   Routine    Referral Type:   Consultation    Referral Reason:   Specialty Services Required    Requested Specialty:   Ophthalmology    Number of Visits Requested:   1     Patient instructions: Regresar en 4-6 semanas en ayunas para laboratorios.  Baje dosis de levothyroxine a 169mg diarios- nueva receta en la farmacia.  SDebroah Ballerdosis de metformin XR a 2 pastillas diarias.  Comienze asmanex 1 inhalacion dos veces al dia todos los dias para mejor control de asthma. Siga singulair diario y albuterol de rescate.  Revise con seguro si cubre vacuna contra shingles (Shingrix).  La remitiremos a doctor de ojo.  Regresar en 3 meses para revision de diabetes.   Follow up plan: Return in about 3 months (around 05/12/2022) for follow up visit.  JRia Bush MD

## 2022-02-09 NOTE — Assessment & Plan Note (Signed)
TSH low - drop levothyroxine dose to 150 mcg daily, recheck thyroid function in 4 to 6 weeks when she returns for fasting labs.

## 2022-02-09 NOTE — Assessment & Plan Note (Signed)
Referred to eye doctor for diabetic retinopathy screening. A1c continues deteriorating-increase metformin XR to 2 tablets daily for a total of 1000 mg daily. Return to clinic 3 months for diabetes recheck.

## 2022-02-09 NOTE — Assessment & Plan Note (Signed)
Remote history of left globus pallidus infarct.  Continue aspirin, statin.

## 2022-02-09 NOTE — Patient Instructions (Addendum)
Regresar en 4-6 semanas en ayunas para laboratorios.  Baje dosis de levothyroxine a 185mg diarios- nueva receta en la farmacia.  SDebroah Ballerdosis de metformin XR a 2 pastillas diarias.  Comienze asmanex 1 inhalacion dos veces al dia todos los dias para mejor control de asthma. Siga singulair diario y albuterol de rescate.  Revise con seguro si cubre vacuna contra shingles (Shingrix).  La remitiremos a doctor de ojo.  Regresar en 3 meses para revision de diabetes.   MOrangeburgMaintenance for Postmenopausal Women La menopausia es un proceso normal en el cual la capacidad de quedar embarazada llega a su fin. Este proceso ocurre lentamente a lo largo de un perodo de muchos meses o aos; por lo general, entre los 460y los 568aos. La menopausia es completa cuando no se ha tenido el perodo menstrual por 12 meses. Es importante hablar con el mdico sobre algunas de las enfermedades ms comunes que afectan a las mujeres despus de la menopausia (mujeres posmenopusicas). Estas incluyen la enfermedad cardaca, el cncer y la prdida sea (osteoporosis). Adoptar un estilo de vida saludable y recibir atencin preventiva pueden ayudar a promover la salud y eMusician Las medidas que tome tambin pueden reducir las probabilidades de dActoralgunas de estas enfermedades frecuentes. Cules son los signos y sntomas de la menopausia? Durante la menopausia, puede tener los siguientes sntomas: ANurse, learning disability Estos pueden ser moderados o intensos. Sudoracin nocturna. Disminucin del deseo sexual. Cambios en el estado de nimo. Dolores de c[place] Cansancio (fatiga). Irritabilidad. Problemas de memoria. Problemas para quedarse dormida o para seguir durmiendo. Hable con el mdico sobre las opciones de tratamiento para sus sntomas. Necesito terapia de reemplazo hormonal? La terapia de reemplazo hormonal es eficaz para tratar los sntomas causados por  la menopausia, como los acaloramientos y las sudoraciones nocturnas. La reposicin hormonal conlleva ciertos riesgos, especialmente a medida que una mujer envejece. Si est pensando en usar estrgeno o estrgeno con progestina, analice los beneficios y los riesgos con el mdico. Cmo puedo reducir el riesgo de tener enfermedad cardaca y accidente cerebrovascular? A medida que se envejece, aumenta el riesgo de enfermedad cardaca, infarto de miocardio y accidente cerebrovascular. Una de las causas puede ser un cambio en las hormonas del cuerpo durante la menopausia. Esto puede afectar la forma en que el organismo procesa las gPatchogue los triglicridos y el colesterol de su dieta. El infarto de miocardio y el accidente cerebrovascular son emergencias mdicas. Hay muchas cosas que se pueden hacer para ayudar a prevenir la enfermedad cardaca y el accidente cerebrovascular. Contrlese la presin arterial La hipertensin arterial causa enfermedades cardacas y aSerbiael riesgo de accidente cerebrovascular. Es ms probable que esto se manifieste en las personas que tienen lecturas de presin arterial alta o tienen sobrepeso. Hgase controlar la presin arterial: Cada 3 a 5 aos si tiene entre 18 y 377aos. Todos los aos si es mayor de 40 aos. Consuma una dieta saludable  Consuma una dieta que incluya muchas verduras, frutas, productos lcteos con bajo contenido de gDjiboutiy pAdvertising account planner No consuma muchos alimentos ricos en grasas slidas, azcares agregados o sodio. Haga ejercicio con regularidad Haga ejercicio con regularidad. Esta es una de las prcticas ms importantes que puede hacer por su salud. La mayora de los adultos deben seguir estas pautas: Intente realizar al menos 150 minutos de actividad fsica por semana. El ejercicio debe aumentar la frecuencia cardaca y hNature conservation officertranspirar (ejercicio de intensidad moderada). Intente hacer  ejercicios de elongacin por lo menos dos veces por semana.  Agrguelos al plan de ejercicio de intensidad moderada. Pase menos tiempo sentada. Incluso la actividad fsica ligera puede ser beneficiosa. Otros consejos Trabaje con su mdico para Science writer o Theatre manager un peso saludable. No consuma ningn producto que contenga nicotina o tabaco. Estos productos incluyen cigarrillos, tabaco para Higher education careers adviser y aparatos de vapeo, como los Psychologist, sport and exercise. Si necesita ayuda para dejar de consumir estos productos, consulte al mdico. Conozca sus cifras. Pdale al mdico que le controle el colesterol y el nivel sanguneo de azcar en la sangre (glucosa). Siga hacindose anlisis de [employer] se lo haya indicado el mdico. Necesito realizarme pruebas de deteccin del cncer? Segn sus antecedentes mdicos y familiares, es posible que deba realizarse pruebas de deteccin del cncer en diferentes etapas de la vida. Esto puede incluir pruebas de deteccin de lo siguiente: Cncer de mama. Cncer de cuello uterino. Cncer de pulmn. Cncer colorrectal. Cul es mi riesgo de tener osteoporosis? Despus de la menopausia, puede correr un riesgo ms alto de tener osteoporosis. La osteoporosis es una afeccin en la cual la destruccin de la masa sea ocurre con mayor rapidez que su formacin. Para ayudar a prevenir esta afeccin o las fracturas seas que pueden ocurrir a causa de [place], usted puede tomar las siguientes medidas: Si tiene entre 32 y 29 aos, reciba como mnimo 1000 mg de calcio y 600 unidades internacionales (UI) de vitamina D por Training and development officer. Si tiene ms de 50 aos pero menos de 70 aos, reciba como mnimo 1200 mg de calcio y al menos 600 unidades internacionales (UI) de vitamina D por da. Si tiene ms de 70 aos, reciba como mnimo 1200 mg de calcio y al menos 800 unidades internacionales (UI) de vitamina D por da. Fumar y beber alcohol en exceso aumentan el riesgo de osteoporosis. Consuma alimentos ricos en calcio y vitamina D, y haga ejercicios con soporte de  peso varias veces a la [place], como se lo haya indicado el mdico. De qu manera la menopausia afecta mi salud mental? La depresin puede presentarse a cualquier edad, pero es ms frecuente a medida que una persona envejece. Los sntomas comunes de depresin incluyen lo siguiente: Sensacin de depresin. Cambios en los patrones de sueo. Cambios en el apetito o en los hbitos de alimentacin. Sensacin de falta general de motivacin o placer al [employer] actividades que sola disfrutar. Crisis frecuentes de llanto. Hable con el mdico si cree que est experimentando alguno de estos sntomas. Indicaciones generales Visite a su mdico para hacerse exmenes de bienestar peridicos y aplicarse vacunas. Puede incluir: Programar exmenes peridicos dentales, de la salud y de Public librarian. Recibir y [employer]. Estos incluyen los siguientes: Human resources officer. Aplquese esta vacuna todos los aos antes de que comience la temporada de gripe. Vacuna contra la neumona. Vacuna contra el herpes. Vacuna contra el ttanos, la difteria y la tos Dyann Ruddle (Tdap). El mdico tambin puede recomendarle que se aplique otras vacunas. Notifique a su mdico si alguna vez ha sido vctima de abuso o si no se siente seguro en su hogar. Resumen La menopausia es un proceso normal en el cual la capacidad de quedar embarazada llega a su fin. Esta condicin causa acaloramientos, sudoraciones nocturnas, disminucin del inters en el sexo, cambios en el estado de nimo, dolores de [place] o falta de sueo. El tratamiento de esta afeccin puede incluir una terapia de reemplazo hormonal. Tome medidas para mantenerse sana, entre ellas, hacer ejercicio  con regularidad, seguir una dieta saludable, controlar su peso y medirse la presin arterial y los niveles de Dispensing optician. Hgase pruebas para Film/video editor y depresin. Asegrese de estar al da con todas las vacunas. Esta informacin no tiene Hydrologist el consejo del mdico. Asegrese de hacerle al mdico cualquier pregunta que tenga. Document Revised: 01/24/2021 Document Reviewed: 01/24/2021 Elsevier Patient Education  [place].

## 2022-02-12 ENCOUNTER — Telehealth: Payer: Self-pay

## 2022-02-12 ENCOUNTER — Other Ambulatory Visit (HOSPITAL_COMMUNITY): Payer: Self-pay

## 2022-02-12 NOTE — Telephone Encounter (Signed)
Prior auth started for Asmanex HFA 100MCG/ACT aerosol. [name] KeyRosalee Kaufman - PA Case ID: [id number] Waiting for determination.

## 2022-02-15 ENCOUNTER — Other Ambulatory Visit (HOSPITAL_COMMUNITY): Payer: Self-pay

## 2022-02-15 MED ORDER — FLUTICASONE PROPIONATE HFA 44 MCG/ACT IN AERO
2.0000 | INHALATION_SPRAY | Freq: Two times a day (BID) | RESPIRATORY_TRACT | 12 refills | Status: DC
  Filled 2022-02-15: qty 10.6, 30d supply, fill #0

## 2022-02-15 NOTE — Telephone Encounter (Signed)
Plz notify pt asmanex denied by insurance. I instead want her to start flovent HFA 2 puffs twice daily sent to [place].

## 2022-02-15 NOTE — Telephone Encounter (Signed)
Prior auth for Asmanex HFA 100MCG/ACT aerosol has been denied. [name] KeyRosalee Kaufman - PA Case ID: [id number] From [place]:  I have reviewed the request to cover Asmanex hfa 100 mcg HFA AER AD. The information  submitted did not meet the criteria necessary to approve this medication. Based on the information provided, I am unable to approve coverage for this medication because: There is no indication that your patient has documented failure, inadequate response,  contraindication per FDA label, or intolerance to one of the following drugs: Arnuity Ellipta, Flovent  Diskus or Flovent HFA. Cigna approves coverage for Alvesco, Asmanex, Asmanex HFA, and Qvar as medically necessary  when there is a documented failure, inadequate response, contraindication per FDA label, or  intolerance to one of the following drugs: Arnuity Ellipta, Flovent Diskus or Flovent HFA.  Denial letter sent to scanning.

## 2022-02-16 NOTE — Telephone Encounter (Signed)
No, he's going to have her try Flovent first.  I'll call pt to let her know.

## 2022-02-16 NOTE — Telephone Encounter (Signed)
Left message on vm per dpr notifying pt Asmanex inhaler was denied by ins co.  So Dr. Darnell Level sent rx for Flovent inhaler instead to [facility].

## 2022-02-21 ENCOUNTER — Telehealth: Payer: Self-pay | Admitting: Podiatry

## 2022-02-21 NOTE — Telephone Encounter (Signed)
Pt called and is having a lot of pain. She stated the pain went away for about 4 days after the injections. What would you recommend. Pt has appt 7.6.2023

## 2022-02-22 ENCOUNTER — Ambulatory Visit (INDEPENDENT_AMBULATORY_CARE_PROVIDER_SITE_OTHER): Payer: PRIVATE HEALTH INSURANCE | Admitting: *Deleted

## 2022-02-22 DIAGNOSIS — M722 Plantar fascial fibromatosis: Secondary | ICD-10-CM | POA: Diagnosis not present

## 2022-02-22 NOTE — Progress Notes (Signed)
Patient presents to the office today with her husband to be fitted for a CAM boot.  She is continuing to have pain and Dr. Al Corpus recommended immobilization.   I fitted her for the boot and reviewed instructions for wearing.   She will follow up with Dr. Al Corpus in 2 weeks.

## 2022-02-22 NOTE — Telephone Encounter (Signed)
Called patient-spoke with her husband. They will be coming by the office today around 1:30 to get fitted for the boot. Advised to continue meloxicam at this time.

## 2022-02-24 ENCOUNTER — Other Ambulatory Visit (HOSPITAL_COMMUNITY): Payer: Self-pay

## 2022-03-08 ENCOUNTER — Encounter: Payer: Self-pay | Admitting: Podiatry

## 2022-03-08 ENCOUNTER — Ambulatory Visit: Payer: PRIVATE HEALTH INSURANCE | Admitting: Podiatry

## 2022-03-08 DIAGNOSIS — M722 Plantar fascial fibromatosis: Secondary | ICD-10-CM | POA: Diagnosis not present

## 2022-03-08 MED ORDER — TRIAMCINOLONE ACETONIDE 40 MG/ML IJ SUSP
20.0000 mg | Freq: Once | INTRAMUSCULAR | Status: AC
  Administered 2022-03-08: 40 mg

## 2022-03-08 MED ORDER — DEXAMETHASONE SODIUM PHOSPHATE 120 MG/30ML IJ SOLN: 2.0000 mg | Freq: Once | INTRAMUSCULAR | Status: DC

## 2022-03-08 NOTE — Progress Notes (Signed)
She presents today with her interpreter as well as her daughter states that it still hurts the foot may be a little bit better but the boot seems to help as she refers to her left plantar fasciitis.  Objective: Pulses are palpable retains pain on palpation medial calcaneal tubercle of the left heel.  Assessment: Chronic intractable Planter fasciitis left.  Plan: I injected it today 30 mg of Kenalog and local anesthetic.  We decided that if this does not make her feel better at this point with the cam walker as well an MRI will be necessary on next visit.

## 2022-03-29 ENCOUNTER — Other Ambulatory Visit (HOSPITAL_COMMUNITY): Payer: Self-pay

## 2022-03-29 ENCOUNTER — Ambulatory Visit: Payer: Commercial Managed Care - HMO | Admitting: Podiatry

## 2022-03-29 ENCOUNTER — Encounter: Payer: Self-pay | Admitting: Podiatry

## 2022-03-29 DIAGNOSIS — M722 Plantar fascial fibromatosis: Secondary | ICD-10-CM

## 2022-03-29 MED ORDER — MELOXICAM 15 MG PO TABS
15.0000 mg | ORAL_TABLET | Freq: Every day | ORAL | 3 refills | Status: DC
  Filled 2022-03-29: qty 30, 30d supply, fill #0

## 2022-03-29 MED ORDER — TRIAMCINOLONE ACETONIDE 40 MG/ML IJ SUSP
20.0000 mg | Freq: Once | INTRAMUSCULAR | Status: AC
  Administered 2022-03-29: 20 mg

## 2022-03-29 NOTE — Progress Notes (Signed)
She presents today with her interpreter she states that she feels about 50 to 60% improvement of her Planter fasciitis left.  Continues to wear her cam boot.  Objective: Vital signs stable alert oriented x3 there is no erythema edema salines drainage odor still has moderate pain on palpation MucoClear tubercle of the left heel.  Assessment: Planter fasciitis left foot.  Plan: I reinjected her third dose of Kenalog.  Also started her on meloxicam.  We will follow-up with her in 1 month if not considerably better we will consider MRI.

## 2022-04-14 ENCOUNTER — Other Ambulatory Visit (HOSPITAL_COMMUNITY): Payer: Self-pay

## 2022-04-16 ENCOUNTER — Other Ambulatory Visit (HOSPITAL_COMMUNITY): Payer: Self-pay

## 2022-04-23 ENCOUNTER — Other Ambulatory Visit (HOSPITAL_COMMUNITY): Payer: Self-pay

## 2022-04-24 ENCOUNTER — Other Ambulatory Visit (HOSPITAL_COMMUNITY): Payer: Self-pay

## 2022-05-03 ENCOUNTER — Other Ambulatory Visit (HOSPITAL_COMMUNITY): Payer: Self-pay

## 2022-05-03 ENCOUNTER — Ambulatory Visit: Payer: Commercial Managed Care - HMO | Admitting: Podiatry

## 2022-05-03 ENCOUNTER — Encounter: Payer: Self-pay | Admitting: Podiatry

## 2022-05-03 DIAGNOSIS — M722 Plantar fascial fibromatosis: Secondary | ICD-10-CM

## 2022-05-03 MED ORDER — TRAMADOL HCL 50 MG PO TABS
50.0000 mg | ORAL_TABLET | Freq: Three times a day (TID) | ORAL | 0 refills | Status: AC | PRN
  Filled 2022-05-03: qty 15, 5d supply, fill #0

## 2022-05-03 NOTE — Progress Notes (Signed)
[name] presents today states that her foot may only be about 40% better particularly while she is wearing the boot if she is not in the boot she states that is no better at all as she refers to her left foot.  She also states that she has a mass behind her left knee that is painful and feels like it is pulling on her calf.  Denies fever chills nausea vomit muscle aches pains calf pain back pain chest pain shortness of breath.  Objective: Vital signs are stable alert oriented x3.  The calf is supple on palpation there is no areas of warmth.  Behind her knee along the lateral aspect there is a soft tissue mass possibly a bursa does not appear to be pulsatile.  Possibly Bakers cyst.  She has severe pain on palpation medial calcaneal tubercle of her left heel.  No pain on medial-lateral compression of the calcaneus.  Assessment: Failure to alleviate symptoms of Planter fasciitis.  New symptoms posterior knee pain is present today.  Plan: Discussed etiology pathology and surgical therapies.  At this point I feel is necessary to refer her to orthopedics for the posterior knee pain.  We are also going to ask for an MRI for evaluation of a plantar fascial tear of her left heel.  This will be for differential diagnosis as well as surgical planning.

## 2022-05-18 ENCOUNTER — Other Ambulatory Visit: Payer: PRIVATE HEALTH INSURANCE

## 2022-05-18 ENCOUNTER — Other Ambulatory Visit (HOSPITAL_COMMUNITY): Payer: Self-pay

## 2022-05-21 ENCOUNTER — Other Ambulatory Visit (HOSPITAL_COMMUNITY): Payer: Self-pay

## 2022-06-22 ENCOUNTER — Ambulatory Visit: Payer: Commercial Managed Care - HMO | Admitting: Podiatry

## 2022-06-22 ENCOUNTER — Other Ambulatory Visit (HOSPITAL_COMMUNITY): Payer: Self-pay

## 2022-06-22 DIAGNOSIS — M722 Plantar fascial fibromatosis: Secondary | ICD-10-CM | POA: Diagnosis not present

## 2022-06-22 MED ORDER — MELOXICAM 15 MG PO TABS
15.0000 mg | ORAL_TABLET | Freq: Every day | ORAL | 0 refills | Status: DC
  Filled 2022-06-22 – 2022-07-12 (×2): qty 30, 30d supply, fill #0

## 2022-06-22 NOTE — Progress Notes (Signed)
  Subjective:  Patient ID: [name], female    DOB: 06/04/1971,  MRN: [id number]  Chief Complaint  Patient presents with   Foot Pain    Left foot pain to the arch. Complaining of numbness to foot. Patient did not get the MRI done due to the location not having an order. Patient is taking Tramadol as needed.     51 y.o. female presents with the above complaint.  Following up for chronic left foot pain.  She has pain to the left arch and left heel.  She has previously been recommended to have an MRI of the left heel by Dr. Milinda Pointer.  She did not get the MRI as there was issue when she went to get it may have gone to the wrong image center..  She is still having chronic pain.  This is status post multiple conservative interventions including multiple steroid injections x3 in the left heel stretching icing and cam boot immobilization.  She is currently wearing cam boot on left foot which does help slightly.   Review of Systems: Negative except as noted in the HPI. Denies N/V/F/Ch.   Objective:  There were no vitals filed for this visit. There is no height or weight on file to calculate BMI. Constitutional Well developed. Well nourished.  Vascular Dorsalis pedis pulses palpable bilaterally. Posterior tibial pulses palpable bilaterally. Capillary refill normal to all digits.  No cyanosis or clubbing noted. Pedal hair growth normal.  Neurologic Normal speech. Oriented to person, place, and time. Epicritic sensation to light touch grossly present bilaterally.  Dermatologic Nails well groomed and normal in appearance. No open wounds. No skin lesions.  Orthopedic: Normal joint ROM without pain or crepitus bilaterally. No visible deformities. Tender to palpation at the calcaneal tuber left. No pain with calcaneal squeeze left. Ankle ROM diminished range of motion left. Silfverskiold Test: negative left.    Assessment:   1. Plantar fasciitis of left foot    Plan:   Patient was evaluated and treated and all questions answered.  Plantar Fasciitis, left, chronic, not improved  - XR reviewed as above.  - Educated on icing and stretching. Instructions given.  - Recommend MRI Left foot as previously discussed. Pt has chronic pain not improved by 3 x steroid injection left heel as well as stretching icing and inserts.  - Discussed surgical intervention may ultimately be necessary to help with chronic left plantar fasciitis. This would involved plantar fasciectomy of the left foot.  - Pharmacologic management: Meloxicam 15 mg take once daily as needed for pain. Educated on risks/benefits and proper taking of medication.   Return in about 4 weeks (around 07/20/2022) for Follow up MRI left foot, PF.

## 2022-07-07 ENCOUNTER — Other Ambulatory Visit (HOSPITAL_COMMUNITY): Payer: Self-pay

## 2022-07-08 ENCOUNTER — Other Ambulatory Visit: Payer: Self-pay

## 2022-07-12 ENCOUNTER — Other Ambulatory Visit (HOSPITAL_COMMUNITY): Payer: Self-pay

## 2022-07-16 ENCOUNTER — Encounter: Payer: Self-pay | Admitting: Family Medicine

## 2022-07-16 ENCOUNTER — Other Ambulatory Visit (HOSPITAL_COMMUNITY): Payer: Self-pay

## 2022-07-16 ENCOUNTER — Ambulatory Visit (INDEPENDENT_AMBULATORY_CARE_PROVIDER_SITE_OTHER): Payer: Commercial Managed Care - HMO | Admitting: Family Medicine

## 2022-07-16 VITALS — BP 136/84 | HR 85 | Temp 97.3°F | Ht 62.25 in | Wt 172.4 lb

## 2022-07-16 DIAGNOSIS — E039 Hypothyroidism, unspecified: Secondary | ICD-10-CM

## 2022-07-16 DIAGNOSIS — M722 Plantar fascial fibromatosis: Secondary | ICD-10-CM

## 2022-07-16 DIAGNOSIS — Z8673 Personal history of transient ischemic attack (TIA), and cerebral infarction without residual deficits: Secondary | ICD-10-CM | POA: Diagnosis not present

## 2022-07-16 DIAGNOSIS — Z23 Encounter for immunization: Secondary | ICD-10-CM | POA: Diagnosis not present

## 2022-07-16 DIAGNOSIS — E1169 Type 2 diabetes mellitus with other specified complication: Secondary | ICD-10-CM | POA: Diagnosis not present

## 2022-07-16 LAB — POCT GLYCOSYLATED HEMOGLOBIN (HGB A1C): Hemoglobin A1C: 8.4 % — AB (ref 4.0–5.6)

## 2022-07-16 LAB — COMPREHENSIVE METABOLIC PANEL
ALT: 25 U/L (ref 0–35)
AST: 12 U/L (ref 0–37)
Albumin: 4.1 g/dL (ref 3.5–5.2)
Alkaline Phosphatase: 94 U/L (ref 39–117)
BUN: 17 mg/dL (ref 6–23)
CO2: 32 mEq/L (ref 19–32)
Calcium: 9.6 mg/dL (ref 8.4–10.5)
Chloride: 100 mEq/L (ref 96–112)
Creatinine, Ser: 0.72 mg/dL (ref 0.40–1.20)
GFR: 96.62 mL/min (ref >60.00)
Glucose, Bld: 177 mg/dL — ABNORMAL HIGH (ref 70–99)
Potassium: 4.4 mEq/L (ref 3.5–5.1)
Sodium: 137 mEq/L (ref 135–145)
Total Bilirubin: 0.3 mg/dL (ref 0.2–1.2)
Total Protein: 7.1 g/dL (ref 6.0–8.3)

## 2022-07-16 LAB — TSH: TSH: 0.56 u[IU]/mL (ref 0.35–5.50)

## 2022-07-16 LAB — MICROALBUMIN / CREATININE URINE RATIO
Creatinine,U: 30.2 mg/dL
Microalb Creat Ratio: 2.3 mg/g (ref 0.0–30.0)
Microalb, Ur: <0.7 mg/dL (ref 0.0–1.9)

## 2022-07-16 MED ORDER — TRULICITY 0.75 MG/0.5ML ~~LOC~~ SOAJ
0.7500 mg | SUBCUTANEOUS | 3 refills | Status: DC
  Filled 2022-07-16 – 2022-07-17 (×2): qty 2, 28d supply, fill #0

## 2022-07-16 NOTE — Progress Notes (Signed)
Patient ID: [name], female    DOB: 10-02-1970, 51 y.o.   MRN: [id number]  This visit was conducted in person.  BP 136/84   Pulse 85   Temp (!) 97.3 F (36.3 C) (Temporal)   Ht 5' 2.25" (1.581 m)   Wt 172 lb 6.4 oz (78.2 kg)   LMP 11/12/2011   SpO2 96%   BMI 31.28 kg/m    CC: diabetes follow up Subjective:   HPI: [name] is a 51 y.o. female presenting on 07/16/2022 for Diabetes (Here for f/u and fasting labs.  However, pt ate around 11:00 today.  Also, pt c/o L foot pain.  H/o plantar fasciitis. )   DM - does not regularly check sugars. Compliant with antihyperglycemic regimen which includes: metformin XR '1000mg'$  daily with breakfast. Denies low sugars or hypoglycemic symptoms. Denies paresthesias, blurry vision. Last diabetic eye exam DUE. Glucometer brand: doesn't have this at home. Last foot exam: DUE. DSME: has not completed. No fmhx thyroid cancer.   Lab Results  Component Value Date   HGBA1C 8.4 (A) 07/16/2022   Diabetic Foot Exam - Simple   No data filed    Lab Results  Component Value Date   MICROALBUR 1.0 01/08/2017     Chronic L foot pain - has seen podiatrist s/p L plantar steroid injection x3, oral steroid and brace use with improvement in symptoms back 02/2022. She continues seeing podiatrist, last seen 06/2022. She was also wearing  CAM walker boot. Planned MRI. Started on meloxicam '15mg'$  daily - doesn't feel well when she takes this. Notes topical CBD helps.   Above complicated by L baker's cyst s/p aspiration and steroid injection through ortho.      Relevant past medical, surgical, family and social history reviewed and updated as indicated. Interim medical history since our last visit reviewed. Allergies and medications reviewed and updated. Outpatient Medications Prior to Visit  Medication Sig Dispense Refill   acetaminophen (TYLENOL) 500 MG tablet Take 1,000 mg by mouth as needed for moderate pain.       albuterol (VENTOLIN HFA) 108 (90 Base) MCG/ACT inhaler INHALE 2 PUFFS INTO THE LUNGS EVERY 6 HOURS AS NEEDED FOR WHEEZING OR SHORTNESS OF BREATH 6.7 g 3   aspirin 81 MG tablet Take 1 tablet (81 mg total) by mouth daily.     atorvastatin (LIPITOR) 40 MG tablet Take 1 tablet (40 mg total) by mouth daily. 90 tablet 3   Calcium Citrate-Vitamin D (CALCIUM + D PO) Take 1 tablet by mouth daily.      fluticasone (FLONASE) 50 MCG/ACT nasal spray Place 2 sprays into both nostrils daily. 16 mL 0   fluticasone (FLOVENT HFA) 44 MCG/ACT inhaler Inhale 2 puffs into the lungs in the morning and at bedtime. 10.6 g 12   levothyroxine (SYNTHROID) 150 MCG tablet Take 1 tablet by mouth every morning. 90 tablet 1   lisinopril-hydrochlorothiazide (ZESTORETIC) 20-25 MG tablet Take 1 tablet by mouth daily. 90 tablet 3   meloxicam (MOBIC) 15 MG tablet Take 1 tablet (15 mg total) by mouth daily. 30 tablet 0   metFORMIN (GLUCOPHAGE-XR) 500 MG 24 hr tablet Take 2 tablets by mouth daily with breakfast. 180 tablet 3   montelukast (SINGULAIR) 10 MG tablet TAKE 1 TABLET BY MOUTH EVERY NIGHT AT BEDTIME 90 tablet 3   vitamin E 180 MG (400 UNITS) capsule Take 400 Units by mouth daily.     meloxicam (MOBIC) 15 MG tablet Take 1  tablet (15 mg total) by mouth daily. 30 tablet 3   No facility-administered medications prior to visit.     Per HPI unless specifically indicated in ROS section below Review of Systems  Objective:  BP 136/84   Pulse 85   Temp (!) 97.3 F (36.3 C) (Temporal)   Ht 5' 2.25" (1.581 m)   Wt 172 lb 6.4 oz (78.2 kg)   LMP 11/12/2011   SpO2 96%   BMI 31.28 kg/m   Wt Readings from Last 3 Encounters:  07/16/22 172 lb 6.4 oz (78.2 kg)  02/09/22 168 lb 8 oz (76.4 kg)  01/22/22 170 lb 6 oz (77.3 kg)      Physical Exam Vitals and nursing note reviewed.  Constitutional:      Appearance: Normal appearance. She is not ill-appearing.  Eyes:     Extraocular Movements: Extraocular movements intact.      Conjunctiva/sclera: Conjunctivae normal.     Pupils: Pupils are equal, round, and reactive to light.  Cardiovascular:     Rate and Rhythm: Normal rate and regular rhythm.     Pulses: Normal pulses.     Heart sounds: Normal heart sounds. No murmur heard. Pulmonary:     Effort: Pulmonary effort is normal. No respiratory distress.     Breath sounds: Normal breath sounds. No wheezing, rhonchi or rales.  Musculoskeletal:        General: Tenderness present.     Right lower leg: No edema.     Left lower leg: No edema.     Comments:  See HPI for foot exam if done Marked tenderness to palpation of left sole at heel and into longitudinal arch Discomfort with calc squeeze test  No pain at achilles  Skin:    General: Skin is warm and dry.     Findings: No rash.  Neurological:     Mental Status: She is alert.  Psychiatric:        Mood and Affect: Mood normal.        Behavior: Behavior normal.       Results for orders placed or performed in visit on 07/16/22  POCT glycosylated hemoglobin (Hb A1C)  Result Value Ref Range   Hemoglobin A1C 8.4 (A) 4.0 - 5.6 %   HbA1c POC (<> result, manual entry)     HbA1c, POC (prediabetic range)     HbA1c, POC (controlled diabetic range)     Lab Results  Component Value Date   CREATININE 0.84 12/05/2020   BUN 20 12/05/2020   NA 137 12/05/2020   K 4.6 12/05/2020   CL 99 12/05/2020   CO2 31 12/05/2020    Lab Results  Component Value Date   TSH 0.33 (L) 02/02/2022    Assessment & Plan:   Problem List Items Addressed This Visit     Hypothyroidism    Chronic, stable on levothyroxine 171mg daily. Update TSH.       History of ischemic stroke    Avoid NSAIDs in h/o CVA.       Type 2 diabetes mellitus with other specified complication (HCC) - Primary    Chronic, deteriorated despite recent increase in metformin XR to '1000mg'$  daily. Discussed benefits of GLP1RA weekly injection as well as mechanism of action and side effects/adverse effects to  monitor for. No fmhx thyroid cancer.  Rx trulicity 0.'75mg'$  weekly for patient to price out. I also asked her to check on insurance preferred glucometer brand to prescribe.  # provided to call Dr GZenia Resides  office for appointment.       Relevant Medications   Dulaglutide (TRULICITY) 9.23 RA/0.7MA SOPN   Other Relevant Orders   POCT glycosylated hemoglobin (Hb A1C) (Completed)   Ambulatory referral to diabetic education   Plantar fasciitis of left foot    Chronic issue.  She's been unable to complete MRI - states she's called a few days prior to appt to cancel it, unsure why. It seems prior authorization has not been able to be obtained. I asked her to reach out to her insurance to inquire about why it's difficult to get approved Treatment limitations - would want to avoid steroids in uncontrolled DM hx  as well as NSAIDs in CVA hx. Consider tramadol vs gabapentin for possible neuropathic component. She prefers to try oral CBD.       Other Visit Diagnoses     Need for influenza vaccination       Relevant Orders   Flu Vaccine QUAD 57moIM (Fluarix, Fluzone & Alfiuria Quad PF) (Completed)        Meds ordered this encounter  Medications   Dulaglutide (TRULICITY) 02.63MFH/5.4TGSOPN    Sig: Inject 0.75 mg into the skin once a week.    Dispense:  2 mL    Refill:  3   Orders Placed This Encounter  Procedures   Flu Vaccine QUAD 664moM (Fluarix, Fluzone & Alfiuria Quad PF)   Ambulatory referral to diabetic education    Referral Priority:   Routine    Referral Type:   Consultation    Referral Reason:   Specialty Services Required    Number of Visits Requested:   1   POCT glycosylated hemoglobin (Hb A1C)     Patient Instructions  Flu shot today  Labs today.  Pare meloxicam.  Puede tratar gotitas de CBD. Si no ayuda, dejeme saber si quiere tratar medicina para dolor de nervio llamada gabapentina.  Llame a su seguro para preguntar sobre resonancia magnetica del pie.  Pregunte al seguro  cual es la marca preferida de glucometro y dejeme saber para recetarla.  Llame al Dr GrKaty Fitche ojos para cita para examen diabetico ([phone] remitire a nutricionista para diabetes en [place]  Trate trulicity 0.'75mg'$  semanal para diabetes - he mandado a su farmacia.  Regresar en 3 meses para diabetes.   Follow up plan: Return in about 3 months (around 10/16/2022) for follow up visit.  JaRia BushMD

## 2022-07-16 NOTE — Assessment & Plan Note (Addendum)
Chronic issue.  She's been unable to complete MRI - states she's called a few days prior to appt to cancel it, unsure why. It seems prior authorization has not been able to be obtained. I asked her to reach out to her insurance to inquire about why it's difficult to get approved Treatment limitations - would want to avoid steroids in uncontrolled DM hx  as well as NSAIDs in CVA hx. Consider tramadol vs gabapentin for possible neuropathic component. She prefers to try oral CBD.

## 2022-07-16 NOTE — Assessment & Plan Note (Signed)
Chronic, stable on levothyroxine 169mg daily. Update TSH.

## 2022-07-16 NOTE — Patient Instructions (Addendum)
Flu shot today  Labs today.  Pare meloxicam.  Puede tratar gotitas de CBD. Si no ayuda, dejeme saber si quiere tratar medicina para dolor de nervio llamada gabapentina.  Llame a su seguro para preguntar sobre resonancia magnetica del pie.  Pregunte al seguro cual es la marca preferida de glucometro y dejeme saber para recetarla.  Llame al Dr Katy Fitch de ojos para cita para examen diabetico [phone]  La remitire a nutricionista para diabetes en [place]  Trate trulicity 0.'75mg'$  semanal para diabetes - he mandado a su farmacia.  Regresar en 3 meses para diabetes.

## 2022-07-16 NOTE — Assessment & Plan Note (Signed)
Avoid NSAIDs in h/o CVA.

## 2022-07-16 NOTE — Assessment & Plan Note (Signed)
Chronic, deteriorated despite recent increase in metformin XR to '1000mg'$  daily. Discussed benefits of GLP1RA weekly injection as well as mechanism of action and side effects/adverse effects to monitor for. No fmhx thyroid cancer.  Rx trulicity 0.'75mg'$  weekly for patient to price out. I also asked her to check on insurance preferred glucometer brand to prescribe.  # provided to call Dr Zenia Resides office for appointment.

## 2022-07-17 ENCOUNTER — Other Ambulatory Visit (HOSPITAL_COMMUNITY): Payer: Self-pay

## 2022-07-18 ENCOUNTER — Telehealth: Payer: Self-pay | Admitting: Podiatry

## 2022-07-18 NOTE — Telephone Encounter (Signed)
Pt called stating [facility] cancel her appt now twice and wants to know what should she do?  Please advise

## 2022-07-19 NOTE — Telephone Encounter (Signed)
[facility] does all prior authorizations for their department so that shouldn't be the issue. I have reached out to the head leader at [facility] to see what is going on and why the patients appts were canceled. She will get back to me soon.

## 2022-07-19 NOTE — Telephone Encounter (Signed)
Do you want both of the MRI'd done? There are 2 MRI orders in there for ankle and foot. Do you want [facility] to do both of them.

## 2022-07-23 ENCOUNTER — Ambulatory Visit: Payer: Commercial Managed Care - HMO | Admitting: Podiatry

## 2022-07-24 ENCOUNTER — Ambulatory Visit: Payer: Commercial Managed Care - HMO | Admitting: Podiatry

## 2022-07-25 ENCOUNTER — Ambulatory Visit
Admission: RE | Admit: 2022-07-25 | Discharge: 2022-07-25 | Disposition: A | Discharge status: Home / Self Care | Payer: Commercial Managed Care - HMO | Source: Ambulatory Visit | Attending: Podiatry | Admitting: Podiatry

## 2022-07-25 DIAGNOSIS — M722 Plantar fascial fibromatosis: Secondary | ICD-10-CM

## 2022-08-01 ENCOUNTER — Other Ambulatory Visit: Payer: Self-pay

## 2022-08-01 ENCOUNTER — Emergency Department (HOSPITAL_COMMUNITY)
Admission: EM | Admit: 2022-08-01 | Discharge: 2022-08-01 | Disposition: A | Discharge status: Home / Self Care | Payer: Commercial Managed Care - HMO | Attending: Emergency Medicine | Admitting: Emergency Medicine

## 2022-08-01 ENCOUNTER — Ambulatory Visit (HOSPITAL_COMMUNITY)
Admission: EM | Admit: 2022-08-01 | Discharge: 2022-08-01 | Disposition: A | Discharge status: Home / Self Care | Payer: Commercial Managed Care - HMO | Attending: Family Medicine | Admitting: Family Medicine

## 2022-08-01 ENCOUNTER — Encounter (HOSPITAL_COMMUNITY): Payer: Self-pay

## 2022-08-01 ENCOUNTER — Ambulatory Visit (INDEPENDENT_AMBULATORY_CARE_PROVIDER_SITE_OTHER): Payer: Commercial Managed Care - HMO

## 2022-08-01 ENCOUNTER — Emergency Department (HOSPITAL_COMMUNITY): Payer: Commercial Managed Care - HMO

## 2022-08-01 DIAGNOSIS — R109 Unspecified abdominal pain: Secondary | ICD-10-CM

## 2022-08-01 DIAGNOSIS — K859 Acute pancreatitis without necrosis or infection, unspecified: Secondary | ICD-10-CM | POA: Insufficient documentation

## 2022-08-01 DIAGNOSIS — E119 Type 2 diabetes mellitus without complications: Secondary | ICD-10-CM | POA: Insufficient documentation

## 2022-08-01 DIAGNOSIS — Z7984 Long term (current) use of oral hypoglycemic drugs: Secondary | ICD-10-CM | POA: Insufficient documentation

## 2022-08-01 DIAGNOSIS — I10 Essential (primary) hypertension: Secondary | ICD-10-CM | POA: Insufficient documentation

## 2022-08-01 DIAGNOSIS — Z79899 Other long term (current) drug therapy: Secondary | ICD-10-CM | POA: Diagnosis not present

## 2022-08-01 DIAGNOSIS — R1012 Left upper quadrant pain: Secondary | ICD-10-CM

## 2022-08-01 DIAGNOSIS — E039 Hypothyroidism, unspecified: Secondary | ICD-10-CM | POA: Insufficient documentation

## 2022-08-01 DIAGNOSIS — Z7982 Long term (current) use of aspirin: Secondary | ICD-10-CM | POA: Diagnosis not present

## 2022-08-01 LAB — COMPREHENSIVE METABOLIC PANEL
ALT: 28 U/L (ref 0–44)
AST: 17 U/L (ref 15–41)
Albumin: 4 g/dL (ref 3.5–5.0)
Alkaline Phosphatase: 90 U/L (ref 38–126)
Anion gap: 9 (ref 5–15)
BUN: 14 mg/dL (ref 6–20)
CO2: 27 mmol/L (ref 22–32)
Calcium: 9.7 mg/dL (ref 8.9–10.3)
Chloride: 102 mmol/L (ref 98–111)
Creatinine, Ser: 0.84 mg/dL (ref 0.44–1.00)
GFR, Estimated: >60 mL/min (ref >60)
Glucose, Bld: 112 mg/dL — ABNORMAL HIGH (ref 70–99)
Potassium: 3.9 mmol/L (ref 3.5–5.1)
Sodium: 138 mmol/L (ref 135–145)
Total Bilirubin: 0.4 mg/dL (ref 0.3–1.2)
Total Protein: 7.5 g/dL (ref 6.5–8.1)

## 2022-08-01 LAB — CBC WITH DIFFERENTIAL/PLATELET
Abs Immature Granulocytes: 0.03 10*3/uL (ref 0.00–0.07)
Basophils Absolute: 0.1 10*3/uL (ref 0.0–0.1)
Basophils Relative: 1 %
Eosinophils Absolute: 0.1 10*3/uL (ref 0.0–0.5)
Eosinophils Relative: 1 %
HCT: 45.4 % (ref 36.0–46.0)
Hemoglobin: 14.6 g/dL (ref 12.0–15.0)
Immature Granulocytes: 0 %
Lymphocytes Relative: 23 %
Lymphs Abs: 2.2 10*3/uL (ref 0.7–4.0)
MCH: 28.8 pg (ref 26.0–34.0)
MCHC: 32.2 g/dL (ref 30.0–36.0)
MCV: 89.5 fL (ref 80.0–100.0)
Monocytes Absolute: 0.7 10*3/uL (ref 0.1–1.0)
Monocytes Relative: 8 %
Neutro Abs: 6.3 10*3/uL (ref 1.7–7.7)
Neutrophils Relative %: 67 %
Platelets: 137 10*3/uL — ABNORMAL LOW (ref 150–400)
RBC: 5.07 MIL/uL (ref 3.87–5.11)
RDW: 12.5 % (ref 11.5–15.5)
WBC: 9.4 10*3/uL (ref 4.0–10.5)
nRBC: 0 % (ref 0.0–0.2)

## 2022-08-01 LAB — POCT URINALYSIS DIPSTICK, ED / UC
Bilirubin Urine: NEGATIVE
Glucose, UA: NEGATIVE mg/dL
Hgb urine dipstick: NEGATIVE
Ketones, ur: NEGATIVE mg/dL
Nitrite: NEGATIVE
Protein, ur: NEGATIVE mg/dL
Specific Gravity, Urine: >=1.03 (ref 1.005–1.030)
Urobilinogen, UA: 0.2 mg/dL (ref 0.0–1.0)
pH: 5.5 (ref 5.0–8.0)

## 2022-08-01 LAB — POC URINE PREG, ED: Preg Test, Ur: NEGATIVE

## 2022-08-01 LAB — LIPASE, BLOOD: Lipase: 102 U/L — ABNORMAL HIGH (ref 11–51)

## 2022-08-01 MED ORDER — OXYCODONE-ACETAMINOPHEN 5-325 MG PO TABS
2.0000 | ORAL_TABLET | Freq: Once | ORAL | Status: AC
  Administered 2022-08-01: 2 via ORAL
  Filled 2022-08-01: qty 2

## 2022-08-01 MED ORDER — ONDANSETRON HCL 4 MG/2ML IJ SOLN
4.0000 mg | Freq: Once | INTRAMUSCULAR | Status: AC
  Administered 2022-08-01: 4 mg via INTRAVENOUS
  Filled 2022-08-01: qty 2

## 2022-08-01 MED ORDER — SODIUM CHLORIDE 0.9 % IV BOLUS
1000.0000 mL | Freq: Once | INTRAVENOUS | Status: AC
  Administered 2022-08-01: 1000 mL via INTRAVENOUS

## 2022-08-01 MED ORDER — MORPHINE SULFATE (PF) 4 MG/ML IV SOLN
4.0000 mg | Freq: Once | INTRAVENOUS | Status: AC
  Administered 2022-08-01: 4 mg via INTRAVENOUS
  Filled 2022-08-01: qty 1

## 2022-08-01 MED ORDER — IOHEXOL 350 MG/ML SOLN
75.0000 mL | Freq: Once | INTRAVENOUS | Status: AC | PRN
  Administered 2022-08-01: 75 mL via INTRAVENOUS

## 2022-08-01 MED ORDER — OXYCODONE-ACETAMINOPHEN 5-325 MG PO TABS: 1.0000 | ORAL_TABLET | ORAL | 0 refills | Status: DC | PRN

## 2022-08-01 MED ORDER — ONDANSETRON HCL 4 MG PO TABS: 4.0000 mg | ORAL_TABLET | Freq: Four times a day (QID) | ORAL | 0 refills | Status: DC | PRN

## 2022-08-01 MED ORDER — HYDROMORPHONE HCL 1 MG/ML IJ SOLN
1.0000 mg | Freq: Once | INTRAMUSCULAR | Status: DC
  Filled 2022-08-01: qty 1

## 2022-08-01 NOTE — ED Triage Notes (Signed)
LUQLLQ pain that started yesterday  was seen at [facility] yesterday and was discharged. Patient reports pain continues.  Loss of appetitie + nausea no vomiting.

## 2022-08-01 NOTE — ED Provider Notes (Signed)
[facility] EMERGENCY DEPARTMENT Provider Note   CSN: [id number] Arrival date & time: 08/01/22  1252     History  Chief Complaint  Patient presents with   Abdominal Pain    [name] is a 51 y.o. female with a history including type 2 diabetes, hypothyroidism, hyperlipidemia and hypertension presenting for evaluation of upper abdominal pain which started yesterday.  She describes constant pain in her left upper quadrant which radiates into her left lower quadrant along with loss of appetite and mild nausea without emesis.  She denies fevers or chills, no vomiting, no change in bowel habits.  She also denies dysuria or reduced urinary frequency.  She is currently hungry but has had reduced p.o. intake secondary to worsened pain with eating.  She was seen at our urgent care center prior to being transferred here for further evaluation.  Surgical history is significant for appendectomy, cholecystectomy and complete hysterectomy.  She has had no treatments prior to arrival.  The history is provided by the patient. The history is limited by a language barrier. A language interpreter was used.       Home Medications Prior to Admission medications   Medication Sig Start Date End Date Taking? Authorizing Provider  ondansetron (ZOFRAN) 4 MG tablet Take 1 tablet (4 mg total) by mouth every 6 (six) hours as needed for nausea or vomiting. 08/01/22  Yes Dwayne Begay, Almyra Free, PA-C  oxyCODONE-acetaminophen (PERCOCET/ROXICET) 5-325 MG tablet Take 1 tablet by mouth every 4 (four) hours as needed for severe pain. 08/01/22  Yes Jmarion Christiano, Almyra Free, PA-C  acetaminophen (TYLENOL) 500 MG tablet Take 1,000 mg by mouth as needed for moderate pain.     [provider]  albuterol (VENTOLIN HFA) 108 (90 Base) MCG/ACT inhaler INHALE 2 PUFFS INTO THE LUNGS EVERY 6 HOURS AS NEEDED FOR WHEEZING OR SHORTNESS OF BREATH 01/22/22 01/22/23  Ria Bush, MD  aspirin 81 MG tablet Take 1  tablet (81 mg total) by mouth daily. 01/11/17   Ria Bush, MD  atorvastatin (LIPITOR) 40 MG tablet Take 1 tablet (40 mg total) by mouth daily. 01/22/22   Ria Bush, MD  Calcium Citrate-Vitamin D (CALCIUM + D PO) Take 1 tablet by mouth daily.     [provider]  Dulaglutide (TRULICITY) 4.09 WJ/1.9JY SOPN Inject 0.75 mg into the skin once a week. 07/16/22   Ria Bush, MD  fluticasone [facility]) 50 MCG/ACT nasal spray Place 2 sprays into both nostrils daily. 10/01/20   Hughie Closs, PA-C  fluticasone (FLOVENT HFA) 44 MCG/ACT inhaler Inhale 2 puffs into the lungs in the morning and at bedtime. 02/15/22   Ria Bush, MD  levothyroxine (SYNTHROID) 150 MCG tablet Take 1 tablet by mouth every morning. 02/09/22   Ria Bush, MD  lisinopril-hydrochlorothiazide (ZESTORETIC) 20-25 MG tablet Take 1 tablet by mouth daily. 01/22/22   Ria Bush, MD  meloxicam (MOBIC) 15 MG tablet Take 1 tablet (15 mg total) by mouth daily. 06/22/22   Standiford, Nena Alexander, DPM  metFORMIN (GLUCOPHAGE-XR) 500 MG 24 hr tablet Take 2 tablets by mouth daily with breakfast. 02/09/22   Ria Bush, MD  montelukast (SINGULAIR) 10 MG tablet TAKE 1 TABLET BY MOUTH EVERY NIGHT AT BEDTIME 01/22/22 01/22/23  Ria Bush, MD  vitamin E 180 MG (400 UNITS) capsule Take 400 Units by mouth daily.    [provider]      Allergies    Covid-19 (mrna) vaccine (pfizer) [covid-19 (mrna) vaccine], Meloxicam, Amoxicillin, and Voltaren [diclofenac sodium]  Review of Systems   Review of Systems  Constitutional:  Negative for fever.  HENT:  Negative for congestion and sore throat.   Eyes: Negative.   Respiratory:  Negative for chest tightness and shortness of breath.   Cardiovascular:  Negative for chest pain.  Gastrointestinal:  Positive for abdominal pain and nausea. Negative for diarrhea and vomiting.  Genitourinary: Negative.  Negative for dysuria.  Musculoskeletal:   Negative for arthralgias, joint swelling and neck pain.  Skin: Negative.  Negative for rash and wound.  Neurological:  Negative for dizziness, weakness, light-headedness, numbness and headaches.  Psychiatric/Behavioral: Negative.      Physical Exam Updated Vital Signs BP 135/75 (BP Location: Left Arm)   Pulse 70   Temp 98.1 F (36.7 C) (Oral)   Resp 18   Ht 5' 2.25" (1.581 m)   Wt 78 kg   LMP 11/12/2011   SpO2 98%   BMI 31.21 kg/m  Physical Exam Vitals and nursing note reviewed.  Constitutional:      Appearance: She is well-developed.  HENT:     Head: Normocephalic and atraumatic.  Eyes:     Conjunctiva/sclera: Conjunctivae normal.  Cardiovascular:     Rate and Rhythm: Normal rate and regular rhythm.     Heart sounds: Normal heart sounds.  Pulmonary:     Effort: Pulmonary effort is normal.     Breath sounds: Normal breath sounds. No wheezing.  Abdominal:     General: Bowel sounds are normal.     Palpations: Abdomen is soft.     Tenderness: There is abdominal tenderness in the epigastric area, left upper quadrant and left lower quadrant. There is no guarding or rebound.  Musculoskeletal:        General: Normal range of motion.     Cervical back: Normal range of motion.  Skin:    General: Skin is warm and dry.  Neurological:     Mental Status: She is alert.     ED Results / Procedures / Treatments   Labs (all labs ordered are listed, but only abnormal results are displayed) Labs Reviewed  COMPREHENSIVE METABOLIC PANEL - Abnormal; Notable for the following components:      Result Value   Glucose, Bld 112 (*)    All other components within normal limits  CBC WITH DIFFERENTIAL/PLATELET - Abnormal; Notable for the following components:   Platelets 137 (*)    All other components within normal limits  LIPASE, BLOOD - Abnormal; Notable for the following components:   Lipase 102 (*)    All other components within normal limits  ETHANOL     EKG None  Radiology CT Abdomen Pelvis W Contrast  Result Date: 08/01/2022 CLINICAL DATA:  Left-sided rib pain worse with deep inspiration EXAM: CT ABDOMEN AND PELVIS WITH CONTRAST TECHNIQUE: Multidetector CT imaging of the abdomen and pelvis was performed using the standard protocol following bolus administration of intravenous contrast. RADIATION DOSE REDUCTION: This exam was performed according to the departmental dose-optimization program which includes automated exposure control, adjustment of the Laura and/or kV according to patient size and/or use of iterative reconstruction technique. CONTRAST:  75m OMNIPAQUE IOHEXOL 350 MG/ML SOLN COMPARISON:  CT abdomen and pelvis dated 04/06/2016 FINDINGS: Lower chest: No focal consolidation or pulmonary nodule in the lung bases. No pleural effusion or pneumothorax demonstrated. Partially imaged heart size is normal. Small left posterior diaphragmatic hernia containing peritoneal fat (3:13), similar to 04/06/2016. Hepatobiliary: No focal hepatic lesions. No intra or extrahepatic biliary ductal dilation. Cholecystectomy. Pancreas:  Subtle peripancreatic stranding at the pancreatic tail. No findings of necrosis. No main pancreatic ductal dilation. Spleen: Normal in size without focal abnormality. Adrenals/Urinary Tract: No adrenal nodules. No suspicious renal mass, calculi or hydronephrosis. No focal bladder wall thickening. Stomach/Bowel: Normal appearance of the stomach. No evidence of bowel wall thickening, distention, or inflammatory changes. Appendectomy. Vascular/Lymphatic: Noncalcified aortic atherosclerosis. No enlarged abdominal or pelvic lymph nodes. Reproductive: Status post hysterectomy. No adnexal masses. Other: No free fluid, fluid collection, or free air. Musculoskeletal: No acute or abnormal lytic or blastic osseous lesions. IMPRESSION: 1. Subtle peripancreatic stranding at the pancreatic tail, suggestive of acute interstitial edematous  pancreatitis. No findings of necrosis. No peripancreatic fluid collection. 2.  Aortic Atherosclerosis (ICD10-I70.0). Electronically Signed   By: Darrin Nipper M.D.   On: 08/01/2022 15:46   DG Abd 1 View  Result Date: 08/01/2022 CLINICAL DATA:  LEFT upper quadrant pain EXAM: ABDOMEN - 1 VIEW COMPARISON:  None Available. FINDINGS: Cholecystectomy clips the RIGHT upper quadrant. No dilated large or small bowel. Gas and stool in the rectum. No pathologic calcifications. No organomegaly IMPRESSION: No acute abdominal findings. Electronically Signed   By: Suzy Bouchard M.D.   On: 08/01/2022 12:17    Procedures Procedures    Medications Ordered in ED Medications  iohexol (OMNIPAQUE) 350 MG/ML injection 75 mL (75 mLs Intravenous Contrast Given 08/01/22 1528)  sodium chloride 0.9 % bolus 1,000 mL (0 mLs Intravenous Stopped 08/01/22 1907)  ondansetron (ZOFRAN) injection 4 mg (4 mg Intravenous Given 08/01/22 1654)  morphine (PF) 4 MG/ML injection 4 mg (4 mg Intravenous Given 08/01/22 1655)  oxyCODONE-acetaminophen (PERCOCET/ROXICET) 5-325 MG per tablet 2 tablet (2 tablets Oral Given 08/01/22 1813)    ED Course/ Medical Decision Making/ A&P                           Medical Decision Making Patient presenting with a 2-day history of upper abdominal and lower abdominal pain, mostly left-sided.  Her exam is significant for left upper abdominal pain without guarding.  Minimal discomfort left lower abdomen.  She has required multiple doses of pain medication here, including morphine which did not improve her pain,  next given oxycodone as this is what she would be discharged home with, but still in pain after giving this.  Discussed admission for further pain control and support with IV fluids.  Pt prefers to go home, but is aware she is offered admission.  She will return for escalating pain, uncontrolled vomiting, development of fever.    Both she and husband agree with this plan.  FYI, pt understands  [place] but does not speak well, husband at bedside fluent.   Amount and/or Complexity of Data Reviewed Labs: ordered.    Details: Labs are abnormal with a lipase of 102, her LFTs are normal, also her WBC count is 9.4.  Glucose of 112.  Radiology: ordered.    Details: CT is revealing for inflammation in the pancreatic tail suggesting mild pancreatitis.  Risk Prescription drug management.           Final Clinical Impression(s) / ED Diagnoses Final diagnoses:  Acute pancreatitis without infection or necrosis, unspecified pancreatitis type    Rx / DC Orders ED Discharge Orders          Ordered    oxyCODONE-acetaminophen (PERCOCET/ROXICET) 5-325 MG tablet  Every 4 hours PRN        08/01/22 2023    ondansetron (ZOFRAN) 4 MG tablet  Every 6 hours PRN        08/01/22 2023              Landis Martins 08/01/22 2025    Davonna Belling, MD 08/01/22 2328

## 2022-08-01 NOTE — ED Notes (Signed)
Discharge instructions reviewed with pt and family. Verbalized understanding of instructions. Belongings with pt upon discharge. Pt taken to car in wheelchair.

## 2022-08-01 NOTE — Discharge Instructions (Signed)
Le atendieron hoy por dolor abdominal. [name] y tus radiografas fueron negativas hoy. Dado su nivel de dolor abdominal sin causa conocida le recomiendo acudir a urgencias para mayor evaluacin.  You were seen today for abdominal pain.  Your urine and xray were negative today.  Given your level of abdominal pain without a known cause I recommend you go to the ER for further evaluation.

## 2022-08-01 NOTE — ED Provider Notes (Addendum)
[place]    CSN: [id number] Arrival date & time: 08/01/22  1013      History   Chief Complaint Chief Complaint  Patient presents with   Flank Pain    HPI [name] is a 51 y.o. female.   Spanish interpreter used today.  She is here for left upper abdominal pain since last night.  Took tylenol without pain.  She has never had this pain before.  The pain is constant.  10/10 in pain.  Pain is worse when she takes a deep breath in.   Pain is sharp.   No n/v.  No fevers/chills.  No diarrhea or constipation.  She did drink this morning with her medications, nothing to eat.  She does not drink ETOH.           Past Medical History:  Diagnosis Date   Bilateral ovarian cysts    Chronic pelvic pain in female    Complication of anesthesia    Hemorrhoids    Hepatic steatosis 07/2006, 04/2014   on Abd CT done for pain   History of anal fissures    History of CVA (cerebrovascular accident) without residual deficits followed by pcp--- takes asa '81mg'$    04/ 2008--- due to cerebrovascular occlusion,  left basal ganglia subacute infarct;   per pt no residuals   History of ectopic pregnancy    2007--  treated with medication   History of hyperthyroidism    s/p  RAI   03-25-2001   Hydrosalpinx    Hypertension    followed by pcp   Hypothyroidism, postradioiodine therapy    followed by pcp---  multinodular goiter;   s/p  [facility] 03-25-2001 for hyperthyroidism   PCOS (polycystic ovarian syndrome)    Pelvic adhesions    PONV (postoperative nausea and vomiting)    Seasonal asthma    followed by pcp--- no inhaler, takes singulair   Type II diabetes mellitus ([place]) dx'd 12/2015   followed by pcp ---   (11-06-2019  per pt does not check blood sugar at home)    Patient Active Problem List   Diagnosis Date Noted   Plantar fasciitis of left foot 01/22/2022   Breast mass 05/31/2021   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   Mass of occipital region 01/11/2017    Encounter for well adult exam with abnormal findings 01/11/2017   Type 2 diabetes mellitus with other specified complication ([place]) 33/54/5625   Endometriosis    Chronic female pelvic pain 05/07/2012   MIGRAINE HEADACHE 05/12/2007   History of ischemic stroke 12/12/2006   Hypothyroidism 10/31/2006   Dyslipidemia associated with type 2 diabetes mellitus ([place]) 10/31/2006   Obesity, Class I, BMI 30-34.9 10/31/2006   HYPERTENSION, BENIGN SYSTEMIC 10/31/2006   Asthma 10/31/2006    Past Surgical History:  Procedure Laterality Date   ABDOMINAL HYSTERECTOMY  04/24/2012   Procedure: HYSTERECTOMY ABDOMINAL;  Surgeon: Terrance Mass, MD;  Location: [facility];  Service: Gynecology;;   ANAL SPHINCTEROTOMY     APPENDECTOMY  1997   ruptured   CERVICAL CERCLAGE  11-06-2008;  10-07-2010  '@[facility]'$    CESAREAN SECTION W/BTL Bilateral 12/2010   bilateral tubal ligation with filshie clips   COLONOSCOPY  05/2019   anal fissure, polyps (TA, HP), rpt 7 yrs ([facility] @ [facility])   ESOPHAGOGASTRODUODENOSCOPY  10/2014   WNL   EXCISION OF ENDOMETRIOMA N/A 08/20/2016   Procedure: EXCISION OF ENDOMETRIOMA LEFT LOWER QUADRANT;  Surgeon: Georganna Skeans, MD;  Location: [facility];  Service: General;  Laterality: N/A;   EXPLORATORY LAPAROTOMY  03/2004   WITH LYSIS ADHESIONS   FLEXIBLE SIGMOIDOSCOPY  10/2018   s/p botox injection for anal fissure ([facility] @ [facility])   LAPAROSCOPIC CHOLECYSTECTOMY  09/1995   LAPAROSCOPIC HYSTERECTOMY  04/24/2012   Procedure: HYSTERECTOMY TOTAL LAPAROSCOPIC;  Surgeon: Terrance Mass, MD;  Location: [facility];  Service: Gynecology;  Laterality: N/A;  2 1/2 hours OR time.  Dr. Phineas Real to assisting    [place] OOPHERECTOMY Bilateral 11/11/2019   diagnostic laparoscopy unsuccessful - extensive adhesions, unable to visualize adnexa (Anyanwu, Sallyanne Havers, MD)    OB History     Gravida  5   Para  1   Term  0   Preterm   1   AB  3   Living         SAB  3   IAB  0   Ectopic  0   Multiple      Live Births               Home Medications    Prior to Admission medications   Medication Sig Start Date End Date Taking? Authorizing Provider  acetaminophen (TYLENOL) 500 MG tablet Take 1,000 mg by mouth as needed for moderate pain.     [provider]  albuterol (VENTOLIN HFA) 108 (90 Base) MCG/ACT inhaler INHALE 2 PUFFS INTO THE LUNGS EVERY 6 HOURS AS NEEDED FOR WHEEZING OR SHORTNESS OF BREATH 01/22/22 01/22/23  Ria Bush, MD  aspirin 81 MG tablet Take 1 tablet (81 mg total) by mouth daily. 01/11/17   Ria Bush, MD  atorvastatin (LIPITOR) 40 MG tablet Take 1 tablet (40 mg total) by mouth daily. 01/22/22   Ria Bush, MD  Calcium Citrate-Vitamin D (CALCIUM + D PO) Take 1 tablet by mouth daily.     [provider]  Dulaglutide (TRULICITY) 9.32 IZ/1.2WP SOPN Inject 0.75 mg into the skin once a week. 07/16/22   Ria Bush, MD  fluticasone [facility]) 50 MCG/ACT nasal spray Place 2 sprays into both nostrils daily. 10/01/20   Hughie Closs, PA-C  fluticasone (FLOVENT HFA) 44 MCG/ACT inhaler Inhale 2 puffs into the lungs in the morning and at bedtime. 02/15/22   Ria Bush, MD  levothyroxine (SYNTHROID) 150 MCG tablet Take 1 tablet by mouth every morning. 02/09/22   Ria Bush, MD  lisinopril-hydrochlorothiazide (ZESTORETIC) 20-25 MG tablet Take 1 tablet by mouth daily. 01/22/22   Ria Bush, MD  meloxicam (MOBIC) 15 MG tablet Take 1 tablet (15 mg total) by mouth daily. 06/22/22   Standiford, Nena Alexander, DPM  metFORMIN (GLUCOPHAGE-XR) 500 MG 24 hr tablet Take 2 tablets by mouth daily with breakfast. 02/09/22   Ria Bush, MD  montelukast (SINGULAIR) 10 MG tablet TAKE 1 TABLET BY MOUTH EVERY NIGHT AT BEDTIME 01/22/22 01/22/23  Ria Bush, MD  vitamin E 180 MG (400 UNITS) capsule Take 400 Units by mouth daily.    [provider]     Family History Family History  Problem Relation Age of Onset   Hypertension Father    Stroke Father        hemorrhagic, deceased   Diabetes Mother    Uterine cancer Mother    Coronary artery disease Neg Hx    Cancer Neg Hx     Social History Social History   Tobacco Use   Smoking status: Never   Smokeless tobacco: Never  Vaping Use  Vaping Use: Never used  Substance Use Topics   Alcohol use: No   Drug use: No     Allergies   Covid-19 (mrna) vaccine (pfizer) [covid-19 (mrna) vaccine], Meloxicam, Amoxicillin, and Voltaren [diclofenac sodium]   Review of Systems Review of Systems  Constitutional: Negative.   HENT: Negative.    Respiratory: Negative.    Cardiovascular: Negative.   Gastrointestinal:  Positive for abdominal pain. Negative for constipation and diarrhea.  Genitourinary:  Positive for flank pain.  Skin: Negative.   Hematological: Negative.   Psychiatric/Behavioral: Negative.       Physical Exam Triage Vital Signs ED Triage Vitals  Enc Vitals Group     BP 08/01/22 1102 (!) 141/92     Pulse Rate 08/01/22 1102 84     Resp 08/01/22 1102 12     Temp 08/01/22 1102 98.2 F (36.8 C)     Temp Source 08/01/22 1102 Oral     SpO2 08/01/22 1102 95 %     Weight --      Height --      Head Circumference --      Peak Flow --      Pain Score 08/01/22 1100 10     Pain Loc --      Pain Edu? --      Excl. in [place]? --    No data found.  Updated Vital Signs BP (!) 141/92 (BP Location: Right Arm)   Pulse 84   Temp 98.2 F (36.8 C) (Oral)   Resp 12   LMP 11/12/2011   SpO2 95%   Visual Acuity Right Eye Distance:   Left Eye Distance:   Bilateral Distance:    Right Eye Near:   Left Eye Near:    Bilateral Near:     Physical Exam Constitutional:      Appearance: Normal appearance. She is ill-appearing.  Cardiovascular:     Rate and Rhythm: Normal rate and regular rhythm.  Pulmonary:     Effort: Pulmonary effort is normal.     Breath sounds:  Normal breath sounds.  Abdominal:     General: Bowel sounds are decreased.     Palpations: Abdomen is soft.     Tenderness: There is abdominal tenderness in the suprapubic area, left upper quadrant and left lower quadrant. There is left CVA tenderness. There is no right CVA tenderness or rebound.  Musculoskeletal:     Cervical back: Normal range of motion and neck supple.  Skin:    General: Skin is warm.  Neurological:     General: No focal deficit present.     Mental Status: She is alert.  Psychiatric:        Mood and Affect: Mood normal.      UC Treatments / Results  Labs (all labs ordered are listed, but only abnormal results are displayed) Labs Reviewed  POCT URINALYSIS DIPSTICK, ED / UC - Abnormal; Notable for the following components:      Result Value   Leukocytes,Ua SMALL (*)    All other components within normal limits  POC URINE PREG, ED    EKG   Radiology DG Abd 1 View  Result Date: 08/01/2022 CLINICAL DATA:  LEFT upper quadrant pain EXAM: ABDOMEN - 1 VIEW COMPARISON:  None Available. FINDINGS: Cholecystectomy clips the RIGHT upper quadrant. No dilated large or small bowel. Gas and stool in the rectum. No pathologic calcifications. No organomegaly IMPRESSION: No acute abdominal findings. Electronically Signed   By: Helane Gunther.D.  On: 08/01/2022 12:17    Procedures Procedures (including critical care time)  Medications Ordered in UC Medications - No data to display  Initial Impression / Assessment and Plan / UC Course  I have reviewed the triage vital signs and the nursing notes.  Pertinent labs & imaging results that were available during my care of the patient were reviewed by me and considered in my medical decision making (see chart for details).    Final Clinical Impressions(s) / UC Diagnoses   Final diagnoses:  Abdominal pain, unspecified abdominal location     Discharge Instructions      Le atendieron hoy por dolor abdominal. Curtis Sites y tus radiografas fueron negativas hoy. Dado su nivel de dolor abdominal sin causa conocida le recomiendo acudir a urgencias para mayor evaluacin.  You were seen today for abdominal pain.  Your urine and xray were negative today.  Given your level of abdominal pain without a known cause I recommend you go to the ER for further evaluation.     ED Prescriptions   None    PDMP not reviewed this encounter.   Rondel Oh, MD 08/01/22 1228    Rondel Oh, MD 08/01/22 1452

## 2022-08-01 NOTE — Discharge Instructions (Addendum)
Take the medications prescribed for pain and nausea.  Refer to the instructions about pancreatitis below.  I recommend a clear liquid diet for the next 24 to 48 hours, after which you can slowly increase your diet as tolerated.  If you are pain returns, you will know to back off on your solid food intake.  Make sure you are maintaining the calories you need, check your blood glucose often while you are on this temporary diet change.

## 2022-08-01 NOTE — ED Triage Notes (Signed)
Patient here with complaint of LLQ abdominal pain and poor appetite that started a a few days ago. Patient is alert, oriented, and in no apparent distress at this time.

## 2022-08-01 NOTE — ED Provider Triage Note (Signed)
Emergency Medicine Provider Triage Evaluation Note  [name] , a 51 y.o. female  was evaluated in triage.  Pt complains of left lower quadrant abdominal pain, lack of appetite, nausea, no diarrhea, no vomiting.  She went to urgent care earlier today after the pain began yesterday and was sent here for evaluation.  Patient denies history of substantial medical problems, takes medication for blood pressure only.  Review of Systems  Positive: Abdominal pain Negative: Fever, vomiting  Physical Exam  BP (!) 146/100 (BP Location: Right Arm)   Pulse 82   Temp 98 F (36.7 C)   Resp 15   LMP 11/12/2011   SpO2 96%  Gen:   Awake, no distress speaking clearly Resp:  Normal effort no increased work of breathing MSK:   Moves extremities without difficulty no deformity Other:  Skin: No rash Abdominal: Tender palpation left lower quadrant  Medical Decision Making  Medically screening exam initiated at 1:02 PM.  Appropriate orders placed.  [name] was informed that the remainder of the evaluation will be completed by another provider, this initial triage assessment does not replace that evaluation, and the importance of remaining in the ED until their evaluation is complete.   Carmin Muskrat, MD 08/01/22 1331

## 2022-08-01 NOTE — ED Notes (Signed)
Patient is being discharged from the Urgent Care and sent to the Emergency Department via POV. Per Dr. Eustace Moore, patient is in need of higher level of care due to abdominal pain. Patient is aware and verbalizes understanding of plan of care.  Vitals:   08/01/22 1102  BP: (!) 141/92  Pulse: 84  Resp: 12  Temp: 98.2 F (36.8 C)  SpO2: 95%

## 2022-08-01 NOTE — ED Notes (Signed)
This RN has called CT to inform them the patient has an IV and is ready for transport to CT.

## 2022-08-01 NOTE — ED Triage Notes (Signed)
Pt is here for sharp pain on the left side (rib) cage since last night. Pt states when she takes a deep breath , worsens

## 2022-08-02 ENCOUNTER — Telehealth: Payer: Self-pay

## 2022-08-02 NOTE — Telephone Encounter (Signed)
Transition Care Management Follow-up Telephone Call Date of discharge and from where: [facility] 08-01-22 Dx: acute pancreatitis without infection or necrosis How have you been since you were released from the hospital? Doing ok  Any questions or concerns? No  Items Reviewed: Did the pt receive and understand the discharge instructions provided? Yes  Medications obtained and verified? Yes  Other? No  Any new allergies since your discharge? No  Dietary orders reviewed? Yes Do you have support at home? Yes   Home Care and Equipment/Supplies: Were home health services ordered? no If so, what is the name of the agency? na  Has the agency set up a time to come to the patient's home? not applicable Were any new equipment or medical supplies ordered?  No What is the name of the medical supply agency? na Were you able to get the supplies/equipment? not applicable Do you have any questions related to the use of the equipment or supplies? No  Functional Questionnaire: (I = Independent and D = Dependent) ADLs: I  Bathing/Dressing- I  Meal Prep- I  Eating- I  Maintaining continence- I  Transferring/Ambulation- I  Managing Meds- I  Follow up appointments reviewed:  PCP Hospital f/u appt confirmed? Yes  Scheduled to see Dr Danise Mina on 08-06-22 @ [facility] f/u appt confirmed? No  . Are transportation arrangements needed? No  If their condition worsens, is the pt aware to call PCP or go to the Emergency Dept.? Yes Was the patient provided with contact information for the PCP's office or ED? Yes Was to pt encouraged to call back with questions or concerns? Yes   Juanda Crumble LPN [facility] [phone]

## 2022-08-06 ENCOUNTER — Encounter: Payer: Self-pay | Admitting: Family Medicine

## 2022-08-06 ENCOUNTER — Ambulatory Visit (INDEPENDENT_AMBULATORY_CARE_PROVIDER_SITE_OTHER): Payer: Commercial Managed Care - HMO | Admitting: Family Medicine

## 2022-08-06 ENCOUNTER — Ambulatory Visit: Payer: Commercial Managed Care - HMO | Admitting: Podiatry

## 2022-08-06 VITALS — BP 130/76 | HR 102 | Temp 97.5°F | Ht 62.25 in | Wt 166.6 lb

## 2022-08-06 DIAGNOSIS — M722 Plantar fascial fibromatosis: Secondary | ICD-10-CM

## 2022-08-06 DIAGNOSIS — E785 Hyperlipidemia, unspecified: Secondary | ICD-10-CM | POA: Diagnosis not present

## 2022-08-06 DIAGNOSIS — E1169 Type 2 diabetes mellitus with other specified complication: Secondary | ICD-10-CM | POA: Diagnosis not present

## 2022-08-06 DIAGNOSIS — Z8719 Personal history of other diseases of the digestive system: Secondary | ICD-10-CM | POA: Insufficient documentation

## 2022-08-06 DIAGNOSIS — K859 Acute pancreatitis without necrosis or infection, unspecified: Secondary | ICD-10-CM | POA: Diagnosis not present

## 2022-08-06 NOTE — Progress Notes (Signed)
Patient ID: [name], female    DOB: 12-Jun-1971, 51 y.o.   MRN: [id number]  This visit was conducted in person.  BP 130/76   Pulse (!) 102   Temp (!) 97.5 F (36.4 C) (Temporal)   Ht 5' 2.25" (1.581 m)   Wt 166 lb 9.6 oz (75.6 kg) Comment: Wearing boot on L leg  LMP 11/12/2011   SpO2 97%   BMI 30.23 kg/m    CC: hosp f/u visit  Subjective:   HPI: [name] is a 51 y.o. female presenting on 08/06/2022 for Hospitalization Follow-up (Seen on 08/01/22 at [facility] ED, dx acute pancreatitis w/o infection or necrosis.  )   First UCC then ER visit 08/01/2022 for upper abdominal pain, found to have acute appendicitis by CT scan (peripancreatic stranding at tail of pancreas without necrosis suggestive of acute interstitial edematous pancreatitis) with lipase 102. Treated with IVF, zofran, morphine and oxycodone. Discharged with Rx zofran and oxycodone APAP 5/325 PRN.   Since home, has continued to have epigastric discomfort and difficulty tolerating PO. Ongoing anorexia, nausea, fatigue. Only ate some solids yesterday, none yet today, lemon tea. She notes some diarrhea that started 2d ago.   No fevers/chills, cough, dysuria or UTI symptoms, no blood in stool or urine.   Lab Results  Component Value Date   HGBA1C 8.4 (A) 07/16/2022    S/p previous appendectomy, cholecystectomy, hysterectomy.  No alcohol intake.   Planned left foot surgery for plantar fasciitis ([facility] in [place]).     Relevant past medical, surgical, family and social history reviewed and updated as indicated. Interim medical history since our last visit reviewed. Allergies and medications reviewed and updated. Outpatient Medications Prior to Visit  Medication Sig Dispense Refill   acetaminophen (TYLENOL) 500 MG tablet Take 1,000 mg by mouth as needed for moderate pain.      albuterol (VENTOLIN HFA) 108 (90 Base) MCG/ACT inhaler INHALE 2 PUFFS INTO THE LUNGS EVERY 6  HOURS AS NEEDED FOR WHEEZING OR SHORTNESS OF BREATH 6.7 g 3   aspirin 81 MG tablet Take 1 tablet (81 mg total) by mouth daily.     atorvastatin (LIPITOR) 40 MG tablet Take 1 tablet (40 mg total) by mouth daily. 90 tablet 3   Calcium Citrate-Vitamin D (CALCIUM + D PO) Take 1 tablet by mouth daily.      fluticasone (FLONASE) 50 MCG/ACT nasal spray Place 2 sprays into both nostrils daily. 16 mL 0   fluticasone (FLOVENT HFA) 44 MCG/ACT inhaler Inhale 2 puffs into the lungs in the morning and at bedtime. 10.6 g 12   levothyroxine (SYNTHROID) 150 MCG tablet Take 1 tablet by mouth every morning. 90 tablet 1   lisinopril-hydrochlorothiazide (ZESTORETIC) 20-25 MG tablet Take 1 tablet by mouth daily. 90 tablet 3   meloxicam (MOBIC) 15 MG tablet Take 1 tablet (15 mg total) by mouth daily. 30 tablet 0   metFORMIN (GLUCOPHAGE-XR) 500 MG 24 hr tablet Take 2 tablets by mouth daily with breakfast. 180 tablet 3   montelukast (SINGULAIR) 10 MG tablet TAKE 1 TABLET BY MOUTH EVERY NIGHT AT BEDTIME 90 tablet 3   ondansetron (ZOFRAN) 4 MG tablet Take 1 tablet (4 mg total) by mouth every 6 (six) hours as needed for nausea or vomiting. 12 tablet 0   oxyCODONE-acetaminophen (PERCOCET/ROXICET) 5-325 MG tablet Take 1 tablet by mouth every 4 (four) hours as needed for severe pain. 24 tablet 0   vitamin E 180 MG (400  UNITS) capsule Take 400 Units by mouth daily.     Dulaglutide (TRULICITY) 9.56 LO/7.5IE SOPN Inject 0.75 mg into the skin once a week. 2 mL 3   No facility-administered medications prior to visit.     Per HPI unless specifically indicated in ROS section below Review of Systems  Objective:  BP 130/76   Pulse (!) 102   Temp (!) 97.5 F (36.4 C) (Temporal)   Ht 5' 2.25" (1.581 m)   Wt 166 lb 9.6 oz (75.6 kg) Comment: Wearing boot on L leg  LMP 11/12/2011   SpO2 97%   BMI 30.23 kg/m   Wt Readings from Last 3 Encounters:  08/06/22 166 lb 9.6 oz (75.6 kg)  08/01/22 172 lb (78 kg)  07/16/22 172 lb 6.4  oz (78.2 kg)      Physical Exam Vitals and nursing note reviewed.  Constitutional:      Appearance: Normal appearance. She is not ill-appearing.  HENT:     Mouth/Throat:     Mouth: Mucous membranes are moist.     Pharynx: Oropharynx is clear. No oropharyngeal exudate or posterior oropharyngeal erythema.  Eyes:     Extraocular Movements: Extraocular movements intact.     Pupils: Pupils are equal, round, and reactive to light.  Cardiovascular:     Rate and Rhythm: Normal rate and regular rhythm.     Pulses: Normal pulses.     Heart sounds: Normal heart sounds. No murmur heard. Pulmonary:     Effort: Pulmonary effort is normal. No respiratory distress.     Breath sounds: Normal breath sounds. No wheezing, rhonchi or rales.  Abdominal:     General: Bowel sounds are normal. There is no distension.     Palpations: Abdomen is soft. There is no mass.     Tenderness: There is abdominal tenderness (moderate) in the left upper quadrant. There is no right CVA tenderness, left CVA tenderness, guarding or rebound. Negative signs include Murphy's sign.     Hernia: No hernia is present.  Musculoskeletal:     Right lower leg: No edema.     Left lower leg: No edema.     Comments: L foot boot in place  Skin:    General: Skin is warm and dry.     Findings: No rash.  Neurological:     Mental Status: She is alert.  Psychiatric:        Mood and Affect: Mood normal.        Behavior: Behavior normal.       Results for orders placed or performed during the hospital encounter of 08/01/22  Comprehensive metabolic panel  Result Value Ref Range   Sodium 138 135 - 145 mmol/L   Potassium 3.9 3.5 - 5.1 mmol/L   Chloride 102 98 - 111 mmol/L   CO2 27 22 - 32 mmol/L   Glucose, Bld 112 (H) 70 - 99 mg/dL   BUN 14 6 - 20 mg/dL   Creatinine, Ser 0.84 0.44 - 1.00 mg/dL   Calcium 9.7 8.9 - 10.3 mg/dL   Total Protein 7.5 6.5 - 8.1 g/dL   Albumin 4.0 3.5 - 5.0 g/dL   AST 17 15 - 41 U/L   ALT 28 0 - 44 U/L    Alkaline Phosphatase 90 38 - 126 U/L   Total Bilirubin 0.4 0.3 - 1.2 mg/dL   GFR, Estimated >60 >60 mL/min   Anion gap 9 5 - 15  CBC with Differential  Result Value Ref Range  WBC 9.4 4.0 - 10.5 K/uL   RBC 5.07 3.87 - 5.11 MIL/uL   Hemoglobin 14.6 12.0 - 15.0 g/dL   HCT 45.4 36.0 - 46.0 %   MCV 89.5 80.0 - 100.0 fL   MCH 28.8 26.0 - 34.0 pg   MCHC 32.2 30.0 - 36.0 g/dL   RDW 12.5 11.5 - 15.5 %   Platelets 137 (L) 150 - 400 K/uL   nRBC 0.0 0.0 - 0.2 %   Neutrophils Relative % 67 %   Neutro Abs 6.3 1.7 - 7.7 K/uL   Lymphocytes Relative 23 %   Lymphs Abs 2.2 0.7 - 4.0 K/uL   Monocytes Relative 8 %   Monocytes Absolute 0.7 0.1 - 1.0 K/uL   Eosinophils Relative 1 %   Eosinophils Absolute 0.1 0.0 - 0.5 K/uL   Basophils Relative 1 %   Basophils Absolute 0.1 0.0 - 0.1 K/uL   Immature Granulocytes 0 %   Abs Immature Granulocytes 0.03 0.00 - 0.07 K/uL  Lipase, blood  Result Value Ref Range   Lipase 102 (H) 11 - 51 U/L    Assessment & Plan:   Problem List Items Addressed This Visit       Unprioritized   Dyslipidemia associated with type 2 diabetes mellitus ([place])    Update FLP. She continues atorvastatin '40mg'$  daily. H/o hypertriglyceridemia - consider fibrate vs vascepa vs lovaza if persistently elevated.  The 10-year ASCVD risk score (Arnett DK, et al., 2019) is: 7.4%   Values used to calculate the score:     Age: 33 years     Sex: Female     Is Non-Hispanic African American: No     Diabetic: Yes     Tobacco smoker: No     Systolic Blood Pressure: 269 mmHg     Is BP treated: Yes     HDL Cholesterol: 31.6 mg/dL     Total Cholesterol: 211 mg/dL       Type 2 diabetes mellitus with other specified complication ([place])    Trulicity recently started - will stop this.  Continue metformin XR '1000mg'$  daily.  Stay off [place] with hx pancreatitis.       Acute pancreatitis without infection or necrosis - Primary    Episode of acute pancreatitis in setting of recent GLP1RA  commencement (Trulicity). Will stop this. Will check triglyceride levels. Reviewed ongoing supportive care at home, discussed bowel rest, pain control (will refill oxycodone), reviewed red flags to seek urgent care or return to ER. Pt agrees with plan.  No signs of infection or pancreatitis complication. To let me know if diarrhea doesn't improve.       Relevant Orders   Lipid panel   Lipase   Comprehensive metabolic panel   CBC with Differential/Platelet     No orders of the defined types were placed in this encounter.  Orders Placed This Encounter  Procedures   Lipid panel   Lipase   Comprehensive metabolic panel   CBC with Differential/Platelet     Patient instructions: Tyler Aas Trulicity (dulaglutide) inyeccion.  Laboratorios hoy para revisar niveles de pancreas y trigliceridos.  Siga tomando pequeas cantidades de liquido diario para mantenerse hidratada.  Busque pedialyte o oral rehydration solution or powder en farmacia sin receta.  Si fiebre >101, o dolor empeora o empieza a vomitar, busque cuidados medicos en urgenias de nuevo, dejeme saber si no mejora un poco cada dia.   Follow up plan: Return in about 3 months (around 11/05/2022), or if symptoms worsen  or fail to improve, for follow up visit.  Ria Bush, MD

## 2022-08-06 NOTE — Assessment & Plan Note (Addendum)
Episode of acute pancreatitis in setting of recent GLP1RA commencement (Trulicity). Will stop this. Will check triglyceride levels. Reviewed ongoing supportive care at home, discussed bowel rest, pain control (will refill oxycodone), reviewed red flags to seek urgent care or return to ER. Pt agrees with plan.  No signs of infection or pancreatitis complication. To let me know if diarrhea doesn't improve.

## 2022-08-06 NOTE — Progress Notes (Signed)
  Subjective:  Patient ID: [name], female    DOB: 09/01/1971,  MRN: [id number]  Chief Complaint  Patient presents with   Results    MRI results    51 y.o. female presents for follow up for chronic left foot pain.  She has pain to the left arch and left heel.  She has recently been able to get her MRI of the left foot and ankle, here to discuss results.  She is still having chronic pain in left heel.  This is status post multiple conservative interventions including multiple steroid injections x3 in the left heel stretching icing and cam boot immobilization.  She is currently wearing cam boot on left foot which does help slightly.   Review of Systems: Negative except as noted in the HPI. Denies N/V/F/Ch.   Objective:  There were no vitals filed for this visit. There is no height or weight on file to calculate BMI. Constitutional Well developed. Well nourished.  Vascular Dorsalis pedis pulses palpable bilaterally. Posterior tibial pulses palpable bilaterally. Capillary refill normal to all digits.  No cyanosis or clubbing noted. Pedal hair growth normal.  Neurologic Normal speech. Oriented to person, place, and time. Epicritic sensation to light touch grossly present bilaterally.  Dermatologic Nails well groomed and normal in appearance. No open wounds. No skin lesions.  Orthopedic: Normal joint ROM without pain or crepitus bilaterally. No visible deformities. Tender to palpation at the calcaneal tuber left. No pain with calcaneal squeeze left. Ankle ROM diminished range of motion left. Silf[place]old Test: negative left.   02/01/22 XR Foot L 3 views weightbearing AP lateral oblique Attention directed to the left plantar heel there is noted to be plantar calcaneal spur present at the tuber.  Also with retrocalcaneal spur present.  07/25/2022 MRI left ankle without contrast 1. 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 2. Moderate-to-severe plantar fasciitis with thickening of the plantar fascia (measuring up to 7 mm) and edema about its attachment on the calcaneus. 3. There is also tendinopathy of the origin of the flexor digitorum brevis tendon.  Assessment:   1. Plantar fasciitis of left foot     Plan:  Patient was evaluated and treated and all questions answered.  Plantar Fasciitis, left, chronic, not improved  -Discussed MRI results with the patient. -Findings consistent with chronic thickening of the plantar fascia related to planter fasciitis. -At this point time as the patient has failed multiple rounds of conservative therapy including injections stretching icing physical therapy etc. I recommend surgical intervention. -Discussed that surgical intervention in her case would consist of endoscopic plantar fasciotomy with likely heel spur resection. -Discussed poss risk of incomplete resolution of pain possibility of residual pain numbness and/or need for further surgery as well as risk of infection blood clot etc. -Discussed with alternatives risk benefits with the patient as well as expected postoperative recovery course.  Patient would like to proceed with surgical intervention.  Informed consent was obtained at this visit. -Surgical plan will consist of endoscopic plantar fasciotomy of the left heel with likely heel spur resection at the same time. -We will begin surgical planning patient to follow-up after surgery.   Return for after OR.        Everitt Amber, DPM [facility]                   08/06/2022

## 2022-08-06 NOTE — Assessment & Plan Note (Signed)
Trulicity recently started - will stop this.  Continue metformin XR '1000mg'$  daily.  Stay off [place] with hx pancreatitis.

## 2022-08-06 NOTE — Patient Instructions (Addendum)
Pare Trulicity (dulaglutide) inyeccion.  Laboratorios hoy para revisar niveles de pancreas y trigliceridos.  Siga tomando pequeas cantidades de liquido diario para mantenerse hidratada.  Busque pedialyte o oral rehydration solution or powder en farmacia sin receta.  Si fiebre >101, o dolor empeora o empieza a vomitar, busque cuidados medicos en urgenias de nuevo, dejeme saber si no mejora un poco cada dia.   Plan de alimentacin para la pancreatitis Pancreatitis Eating Plan La pancreatitis ocurre cuando el pncreas se irrita e hincha (inflama). El pncreas es una glndula pequea que est ubicada detrs del [place]. Ayuda al cuerpo a digerir los alimentos y regular el Dispensing optician. La pancreatitis puede afectar la forma en que el organismo digiere los alimentos, especialmente los alimentos con grasa. Tambin pueden presentarse otros sntomas, como dolor en el abdomen (dolor abdominal) o nuseas. Cuando una persona tiene pancreatitis, seguir un plan de alimentacin con bajo contenido de grasa puede ayudarla a controlar los sntomas y a recuperarse con mayor rapidez. Consulte con su mdico o nutricionista para crear un plan de alimentacin que sea adecuado para usted. Cules son algunos consejos para seguir este plan? Lea las etiquetas de los alimentos Utilice la informacin incluida en las etiquetas de los alimentos para Theatre manager un registro de la cantidad de grasa que consume: Consulte el tamao de la porcin. Busque la cantidad de grasas totales en gramos (g) por porcin. Los alimentos con bajo contenido de [place] tienen 3 g de grasa o menos por porcin. Los alimentos sin contenido de [place] tienen 0.5 g de grasa o menos por porcin. Lleve un registro de la cantidad de grasa que consume en funcin de la cantidad de porciones que come. Por ejemplo, si come 2 porciones, la cantidad de grasa que consume ser [employer] lo indicado en la etiqueta. Al ir de compras  Compre alimentos con bajo  contenido de grasa o sin grasa, por ejemplo: Frutas y verduras frescas, congeladas o enlatadas. Cereales, que incluyan panes, pastas y arroz. Carne magra, carne de ave, pescado y otros alimentos proteicos. Productos lcteos semidescremados o descremados. Evite comprar productos de panadera y otros dulces hechos con [place], [place] y [place]. Evite comprar tentempis con grasa agregada, como cualquier alimento con [place] o con sabor a queso. Al cocinar Quite la piel de las aves y el exceso de grasa de la carne. Limite la cantidad de grasa y aceite que [place] a 6 cucharaditas (30 ml) o menos por da. Para cocinar, opte por mtodos con bajo contenido de grasa, como hervir, asar, Interior and spatial designer, cocer al vapor y Teacher, music. Use aceite en aerosol para cocinar. Agregue caldo de pollo desgrasado para darle ms sabor y la humedad a Environmental education officer. Evite agregar crema para espesar las sopas y salsas. Use otros productos para espesar, por ejemplo, almidn de maz o pasta de tomate. Planificacin de las comidas  Consuma una dieta con bajo contenido de grasa como se lo haya indicado el nutricionista. En la [employer], esto significa no consumir ms de 55 a 65 g de Engineer, water. Coma comidas pequeas, con frecuencia, [place]. Por ejemplo, puede comer 5 o 6 comidas pequeas en lugar de 3 comidas abundantes. Beba suficiente lquido como para Theatre manager la orina de color amarillo plido. No beba alcohol. Si necesita ayuda para dejar de hacerlo, hable con el mdico. Limite la cantidad de cafena que consume, por ejemplo, caf negro, t negro y t verde, bebidas con cafena y bebidas energizantes. Planifique incluir  alimentos variados en su alimentacin. Incluya frutas, verduras, cereales integrales y protenas magras, as como productos lcteos descremados o bajos en grasas. Necesita seguir una dieta equilibrada para tener una buena salud general. Informacin general Informe a su mdico o  al nutricionista si tiene una prdida de peso no planificada con este plan de alimentacin. Es posible que le indiquen que siga una dieta de lquidos transparentes o alimentos blandos cuando los sntomas vuelvan a Arts administrator, lo que se denomina "agudizacin". Hable con el mdico sobre cmo controlar su dieta durante la agudizacin de los sntomas. Tome vitaminas o suplementos como se lo haya indicado el mdico. Es posible que necesite tomar los siguientes: Vitaminas adicionales que se disuelven en grasa (son liposolubles), como las vitaminas A, D, E y K. Suplementos nutricionales. Trabaje con un nutricionista, especialmente si tiene [employer], como obesidad, osteoporosis o diabetes mellitus. Algunas personas necesitan tratamientos adicionales como, por ejemplo: [place] o cpsulas para reemplazar enzimas (terapia oral de reemplazo de enzimas pancreticas). Alimentacin a travs de una sonda en el estmago o el intestino (alimentacin enteral). Qu alimentos debo evitar? Lambert Mody Frutas fritas. Frutas servidas con mantequilla o crema. Verduras Verduras fritas. Verduras cocinadas con [place], queso o crema. Granos Bizcochos, waffles, donas, pasteles y medialunas. Tartas y galletitas. Palomitas de maz saborizadas con mantequilla. Galletas comunes. Carnes y otras protenas [place] de carne con alto contenido de Lobbyist. Carne de ave con piel. Vsceras. Carne precocinada o curada, como embutidos o panes de carne. Huevos enteros. Frutos secos y Engineer, mining de frutos secos. Lcteos Leche entera y [place] con 2 % de grasa. Sells Helados de [employer]. Crema y [place] de crema y [place]. Queso, como el queso crema. Phoebe Sharps. Bebidas Vino, cerveza y licor. Es posible que los productos que se enumeran ms [place] no constituyan una lista completa de los alimentos y las bebidas que Nurse, adult. Consulte a un nutricionista para obtener ms informacin. Resumen La pancreatitis  puede afectar la forma en que el organismo digiere los alimentos, especialmente los alimentos con grasa. Cuando una persona tiene pancreatitis, se le recomienda que siga un plan de alimentacin con bajo contenido de grasa para ayudarlo a recuperarse ms rpidamente y a Emergency planning/management officer. No beba alcohol. Limite la cantidad de cafena que consume y beba suficiente lquido para Theatre manager la orina de color amarillo plido. Esta informacin no tiene Marine scientist el consejo del mdico. Asegrese de hacerle al mdico cualquier pregunta que tenga. Document Revised: 09/14/2021 Document Reviewed: 09/14/2021 Elsevier Patient Education  [place].

## 2022-08-06 NOTE — Assessment & Plan Note (Signed)
Update FLP. She continues atorvastatin '40mg'$  daily. H/o hypertriglyceridemia - consider fibrate vs vascepa vs lovaza if persistently elevated.  The 10-year ASCVD risk score (Arnett DK, et al., 2019) is: 7.4%   Values used to calculate the score:     Age: 51 years     Sex: Female     Is Non-Hispanic African American: No     Diabetic: Yes     Tobacco smoker: No     Systolic Blood Pressure: 496 mmHg     Is BP treated: Yes     HDL Cholesterol: 31.6 mg/dL     Total Cholesterol: 211 mg/dL

## 2022-08-07 ENCOUNTER — Telehealth: Payer: Self-pay

## 2022-08-07 LAB — COMPREHENSIVE METABOLIC PANEL
ALT: 47 U/L — ABNORMAL HIGH (ref 0–35)
AST: 19 U/L (ref 0–37)
Albumin: 4.6 g/dL (ref 3.5–5.2)
Alkaline Phosphatase: 110 U/L (ref 39–117)
BUN: 20 mg/dL (ref 6–23)
CO2: 29 mEq/L (ref 19–32)
Calcium: 9.8 mg/dL (ref 8.4–10.5)
Chloride: 99 mEq/L (ref 96–112)
Creatinine, Ser: 1 mg/dL (ref 0.40–1.20)
GFR: 65.11 mL/min (ref >60.00)
Glucose, Bld: 154 mg/dL — ABNORMAL HIGH (ref 70–99)
Potassium: 3.9 mEq/L (ref 3.5–5.1)
Sodium: 138 mEq/L (ref 135–145)
Total Bilirubin: 0.3 mg/dL (ref 0.2–1.2)
Total Protein: 7.7 g/dL (ref 6.0–8.3)

## 2022-08-07 LAB — LIPASE: Lipase: 39 U/L (ref 11.0–59.0)

## 2022-08-07 LAB — LIPID PANEL
Cholesterol: 242 mg/dL — ABNORMAL HIGH (ref 0–200)
HDL: 37.4 mg/dL — ABNORMAL LOW (ref >39.00)
NonHDL: 204.31
Total CHOL/HDL Ratio: 6
Triglycerides: 296 mg/dL — ABNORMAL HIGH (ref 0.0–149.0)
VLDL: 59.2 mg/dL — ABNORMAL HIGH (ref 0.0–40.0)

## 2022-08-07 LAB — CBC WITH DIFFERENTIAL/PLATELET
Basophils Absolute: 0.1 10*3/uL (ref 0.0–0.1)
Basophils Relative: 0.7 % (ref 0.0–3.0)
Eosinophils Absolute: 0.1 10*3/uL (ref 0.0–0.7)
Eosinophils Relative: 1.3 % (ref 0.0–5.0)
HCT: 45.5 % (ref 36.0–46.0)
Hemoglobin: 15.2 g/dL — ABNORMAL HIGH (ref 12.0–15.0)
Lymphocytes Relative: 24.2 % (ref 12.0–46.0)
Lymphs Abs: 1.8 10*3/uL (ref 0.7–4.0)
MCHC: 33.4 g/dL (ref 30.0–36.0)
MCV: 89.1 fl (ref 78.0–100.0)
Monocytes Absolute: 0.5 10*3/uL (ref 0.1–1.0)
Monocytes Relative: 7.2 % (ref 3.0–12.0)
Neutro Abs: 4.9 10*3/uL (ref 1.4–7.7)
Neutrophils Relative %: 66.6 % (ref 43.0–77.0)
Platelets: 133 10*3/uL — ABNORMAL LOW (ref 150.0–400.0)
RBC: 5.1 Mil/uL (ref 3.87–5.11)
RDW: 12.9 % (ref 11.5–15.5)
WBC: 7.3 10*3/uL (ref 4.0–10.5)

## 2022-08-07 LAB — LDL CHOLESTEROL, DIRECT: Direct LDL: 170 mg/dL

## 2022-08-07 NOTE — Telephone Encounter (Signed)
Received surgery paperwork from the [facility] office. Left a message for [name] to call and schedule surgery with Dr. Loel Lofty.

## 2022-08-08 ENCOUNTER — Other Ambulatory Visit (HOSPITAL_COMMUNITY): Payer: Self-pay

## 2022-08-09 ENCOUNTER — Telehealth: Payer: Self-pay | Admitting: Podiatry

## 2022-08-09 ENCOUNTER — Other Ambulatory Visit (HOSPITAL_COMMUNITY): Payer: Self-pay

## 2022-08-09 NOTE — Telephone Encounter (Signed)
DOS: 08/22/2022  [employer]  Endoscopic Plantar Fasciotomy Lt (84132) Heel Spur Resection Lt (44010)  Deductible: $0 Out-of-Pocket: $1,700 with $571.18 remaining CoInsurance: 25%  Prior authorization is not required per [employer].  Reference #: [id number]  Patient Search> Results> Patient Details> [id number] Benefit Reference Number: [id number] Coverage type: [place] Status Select a TIN and provider/group to verify the network status for this patient's coverage details. Click to see your list of [place] [facility] Eligibility as of date Aug 09, 2022  Notification, Referral and Precertification Requirements  Procedure Code Search Procedure Code Benefits/Results This is not a guarantee of coverage or that the coverage amounts shown will remain unchanged until the date services are rendered. Any claim submitted is subject to all plan provisions including eligibility requirements, exclusions, limitations and state mandates. Coverage will be determined on the basis of the facts existing when services are rendered.  [facility] [id number] - OSTECTOMY, CALCANEUS; FOR SPUR, WITH OR WITHOUT PLANTAR FASCIAL RELEASE  In-Network Out-of-Network In-Network No Precertification Required  Prior authorization requirements may be different based on the network information for the servicing provider. Participation in a [employer] or out-of-network situations may impact the precertification requirements displayed here.  Place of Service: [place] 32% No applicable designations  Copayment Does not apply No applicable designations  Deductible Not applicable No applicable designations  Maximum Unlimited No applicable designations  Out-Of-Pocket Maximum Individual remaining:  $571.18 Total: $9,518.84  Notes  No applicable designations  [phone] - ENDOSCOPIC PLANTAR  FASCIOTOMY  In-Network Out-of-Network In-Network No Precertification Required  Prior authorization requirements may be different based on the network information for the servicing provider. Participation in a [employer] or out-of-network situations may impact the precertification requirements displayed here.  Place of Service: [place] 30% No applicable designations  Copayment Does not apply No applicable designations  Deductible Not applicable No applicable designations  Maximum Unlimited No applicable designations  Out-Of-Pocket Maximum Individual remaining:  $571.18 Total: $1,601.09  Notes  No applicable designations

## 2022-08-22 ENCOUNTER — Other Ambulatory Visit (HOSPITAL_COMMUNITY): Payer: Self-pay

## 2022-08-22 ENCOUNTER — Other Ambulatory Visit (INDEPENDENT_AMBULATORY_CARE_PROVIDER_SITE_OTHER): Payer: Commercial Managed Care - HMO | Admitting: Podiatry

## 2022-08-22 ENCOUNTER — Encounter: Payer: Self-pay | Admitting: Podiatry

## 2022-08-22 DIAGNOSIS — M7732 Calcaneal spur, left foot: Secondary | ICD-10-CM | POA: Diagnosis not present

## 2022-08-22 DIAGNOSIS — M722 Plantar fascial fibromatosis: Secondary | ICD-10-CM | POA: Diagnosis not present

## 2022-08-22 DIAGNOSIS — Z9889 Other specified postprocedural states: Secondary | ICD-10-CM

## 2022-08-22 MED ORDER — ASPIRIN 81 MG PO TBEC: 81.0000 mg | DELAYED_RELEASE_TABLET | Freq: Two times a day (BID) | ORAL | 0 refills | Status: AC

## 2022-08-22 MED ORDER — OXYCODONE-ACETAMINOPHEN 5-325 MG PO TABS: 1.0000 | ORAL_TABLET | ORAL | 0 refills | Status: AC | PRN

## 2022-08-22 MED ORDER — CLINDAMYCIN HCL 300 MG PO CAPS: 300.0000 mg | ORAL_CAPSULE | Freq: Three times a day (TID) | ORAL | 0 refills | Status: AC

## 2022-08-22 NOTE — Progress Notes (Signed)
Postop medications sent 

## 2022-08-31 ENCOUNTER — Ambulatory Visit (INDEPENDENT_AMBULATORY_CARE_PROVIDER_SITE_OTHER): Payer: Commercial Managed Care - HMO

## 2022-08-31 ENCOUNTER — Ambulatory Visit (INDEPENDENT_AMBULATORY_CARE_PROVIDER_SITE_OTHER): Payer: Commercial Managed Care - HMO | Admitting: Podiatry

## 2022-08-31 DIAGNOSIS — Z09 Encounter for follow-up examination after completed treatment for conditions other than malignant neoplasm: Secondary | ICD-10-CM

## 2022-08-31 DIAGNOSIS — M722 Plantar fascial fibromatosis: Secondary | ICD-10-CM

## 2022-08-31 DIAGNOSIS — M7732 Calcaneal spur, left foot: Secondary | ICD-10-CM

## 2022-08-31 NOTE — Progress Notes (Signed)
  Subjective:  Patient ID: [name], female    DOB: 01-08-1971,  MRN: [id number]  Chief Complaint  Patient presents with   Routine Post Op    POV #1 DOS 08/22/2022 LT FOOT EPF, POSS SPUR RESECTION    DOS: 08/22/22 Procedure: Left foot endoscopic plantar fasciotomy and heel spur resection  51 y.o. female returns for post-op check.  She states she is doing better recently since surgery.  She had pain initially and a lot of swelling for the first week after surgery.  She is now 9 days postoperatively.  She is not taking much if any medication for pain at this point in time.  Has been nonweightbearing in a cam boot since surgery has kept the dressing clean dry and intact since surgery.  Review of Systems: Negative except as noted in the HPI. Denies N/V/F/Ch.   Objective:  There were no vitals filed for this visit. There is no height or weight on file to calculate BMI. Constitutional Well developed. Well nourished.  Vascular Foot warm and well perfused. Capillary refill normal to all digits.  Calf is soft and supple, no posterior calf or knee pain, negative Homans' sign  Neurologic Normal speech. Oriented to person, place, and time. Epicritic sensation to light touch grossly present bilaterally.  Dermatologic Skin healing well without signs of infection at the 2 portals medial and lateral heel.  Skin is well-healed enough to remove sutures. Skin edges well coapted without signs of infection.  Orthopedic: Tenderness to palpation noted about the surgical site.   Multiple view plain film radiographs: 3 views left foot AP lateral oblique nonweightbearing 08/31/2022.  Findings: Attention directed to the plantar calcaneus of the left foot there is noted to be interval resection of the plantar calcaneal spur.  No other osseous abnormality or fracture identified. Assessment:   1. Postoperative examination   2. Plantar fasciitis of left foot   3. Heel spur, left    Plan:   Patient was evaluated and treated and all questions answered.  S/p foot surgery left foot endoscopic plantar fasciotomy and heel spur resection -Progressing as expected post-operatively. -XR: As above no acute postoperative complication -WB Status: Nonweightbearing in cam boot until 2 weeks postoperatively and then transition to weightbearing in the cam boot thereafter until her next follow-up. -Sutures: Removed at this visit. -Medications: Tylenol or ibuprofen as needed for pain control -Foot redressed with Steri-Strips and large Band-Aid.  Patient is okay to get the foot wet at this time and wash but should continue to dry the area thoroughly and apply new Band-Aid over the incisions on either side of the heel for another week.  Return in about 2 weeks (around 09/14/2022) for 2nd POV L foot EPF, spur resection.         Everitt Amber, DPM [facility]

## 2022-09-14 ENCOUNTER — Ambulatory Visit (INDEPENDENT_AMBULATORY_CARE_PROVIDER_SITE_OTHER): Payer: Commercial Managed Care - HMO | Admitting: Podiatry

## 2022-09-14 VITALS — BP 138/84

## 2022-09-14 DIAGNOSIS — Z09 Encounter for follow-up examination after completed treatment for conditions other than malignant neoplasm: Secondary | ICD-10-CM

## 2022-09-14 DIAGNOSIS — M722 Plantar fascial fibromatosis: Secondary | ICD-10-CM

## 2022-09-14 NOTE — Progress Notes (Signed)
  Subjective:  Patient ID: [name], female    DOB: 09-29-1970,  MRN: [id number]  Chief Complaint  Patient presents with   Routine Post Op    Pt states she is having throbbing pain near the heel area , this started about 1 week ago. She denies any other concerns or any swelling     DOS: 08/22/22 Procedure: Left foot endoscopic plantar fasciotomy and heel spur resection  52 y.o. female returns for post-op check.  She states she is doing well.  She says that the pain has decreased she only has pain when she walks sometimes.  She does think that the boot is causing her pain.  She has been walking in the cam boot since last visit.  Review of Systems: Negative except as noted in the HPI. Denies N/V/F/Ch.   Objective:   Vitals:   09/14/22 0958  BP: 138/84   There is no height or weight on file to calculate BMI. Constitutional Well developed. Well nourished.  Vascular Foot warm and well perfused. Capillary refill normal to all digits.  Calf is soft and supple, no posterior calf or knee pain, negative Homans' sign  Neurologic Normal speech. Oriented to person, place, and time. Epicritic sensation to light touch grossly present bilaterally.  Dermatologic Skin well-healed to the medial and lateral portal.   Orthopedic: Tenderness to palpation noted about the surgical site.   Multiple view plain film radiographs: 3 views left foot AP lateral oblique nonweightbearing 08/31/2022.  Findings: Attention directed to the plantar calcaneus of the left foot there is noted to be interval resection of the plantar calcaneal spur.  No other osseous abnormality or fracture identified. Assessment:   1. Postoperative examination   2. Plantar fasciitis of left foot     Plan:  Patient was evaluated and treated and all questions answered.  S/p foot surgery left foot endoscopic plantar fasciotomy and heel spur resection -Progressing as expected post-operatively.  Pain decreased from  prior -XR: Deferred at this visit -WB Status: Continue to be weightbearing and can transition out of the cam boot into regular shoes with inserts. -Recommend power step orthotics which she does not have for her shoes.  Dispensed 1 pair of power steps for the patient -Medications: Tylenol or ibuprofen as needed for pain control -Will place referral to physical therapy for continued rehab of the left heel after plantar fasciotomy and heel spur resection.  Referral to [facility] faxed. -Patient will follow-up in 5 weeks for left plantar fasciitis heel spur resection  Return in about 5 weeks (around 10/19/2022) for POV L heel PF/spur resection.         Everitt Amber, DPM [facility]

## 2022-09-14 NOTE — Patient Instructions (Signed)

## 2022-09-18 ENCOUNTER — Other Ambulatory Visit (HOSPITAL_COMMUNITY): Payer: Self-pay

## 2022-09-20 ENCOUNTER — Other Ambulatory Visit (HOSPITAL_COMMUNITY): Payer: Self-pay

## 2022-09-21 ENCOUNTER — Other Ambulatory Visit (HOSPITAL_COMMUNITY): Payer: Self-pay

## 2022-09-24 ENCOUNTER — Ambulatory Visit: Payer: Commercial Managed Care - HMO | Admitting: Dietician

## 2022-09-25 ENCOUNTER — Other Ambulatory Visit (HOSPITAL_COMMUNITY): Payer: Self-pay

## 2022-10-19 ENCOUNTER — Encounter: Payer: Self-pay | Admitting: Podiatry

## 2022-10-31 ENCOUNTER — Other Ambulatory Visit (HOSPITAL_COMMUNITY): Payer: Self-pay

## 2022-11-02 ENCOUNTER — Ambulatory Visit (INDEPENDENT_AMBULATORY_CARE_PROVIDER_SITE_OTHER): Payer: Self-pay | Admitting: Podiatry

## 2022-11-02 DIAGNOSIS — Z09 Encounter for follow-up examination after completed treatment for conditions other than malignant neoplasm: Secondary | ICD-10-CM

## 2022-11-02 DIAGNOSIS — M722 Plantar fascial fibromatosis: Secondary | ICD-10-CM

## 2022-11-02 NOTE — Progress Notes (Signed)
Pt was a no show for apt, no charge 

## 2022-11-13 ENCOUNTER — Ambulatory Visit: Payer: Self-pay | Admitting: Dietician

## 2022-11-24 ENCOUNTER — Other Ambulatory Visit: Payer: Self-pay | Admitting: Family Medicine

## 2022-11-24 DIAGNOSIS — D696 Thrombocytopenia, unspecified: Secondary | ICD-10-CM | POA: Insufficient documentation

## 2022-11-24 DIAGNOSIS — E039 Hypothyroidism, unspecified: Secondary | ICD-10-CM

## 2022-11-24 DIAGNOSIS — E1169 Type 2 diabetes mellitus with other specified complication: Secondary | ICD-10-CM

## 2022-11-24 DIAGNOSIS — Z8719 Personal history of other diseases of the digestive system: Secondary | ICD-10-CM

## 2022-11-27 ENCOUNTER — Other Ambulatory Visit: Payer: Self-pay

## 2022-12-05 ENCOUNTER — Other Ambulatory Visit: Payer: Self-pay

## 2022-12-05 ENCOUNTER — Other Ambulatory Visit (HOSPITAL_COMMUNITY): Payer: Self-pay

## 2022-12-05 ENCOUNTER — Other Ambulatory Visit: Payer: Self-pay | Admitting: Family Medicine

## 2022-12-05 DIAGNOSIS — E039 Hypothyroidism, unspecified: Secondary | ICD-10-CM

## 2022-12-05 MED ORDER — LEVOTHYROXINE SODIUM 150 MCG PO TABS
150.0000 ug | ORAL_TABLET | Freq: Every morning | ORAL | 0 refills | Status: DC
  Filled 2022-12-05: qty 90, 90d supply, fill #0

## 2022-12-05 NOTE — Telephone Encounter (Signed)
LVMTCB and sched

## 2022-12-05 NOTE — Telephone Encounter (Signed)
E-scribed refill.  Plz schedule CPE and fasting lab visits for additional refills. 

## 2022-12-05 NOTE — Telephone Encounter (Signed)
Noted  

## 2022-12-10 ENCOUNTER — Other Ambulatory Visit (HOSPITAL_COMMUNITY): Payer: Self-pay

## 2023-03-25 ENCOUNTER — Ambulatory Visit (HOSPITAL_COMMUNITY)
Admission: EM | Admit: 2023-03-25 | Discharge: 2023-03-25 | Disposition: A | Discharge status: Home / Self Care | Payer: BLUE CROSS/BLUE SHIELD | Attending: Family Medicine | Admitting: Family Medicine

## 2023-03-25 DIAGNOSIS — K644 Residual hemorrhoidal skin tags: Secondary | ICD-10-CM

## 2023-03-25 MED ORDER — IBUPROFEN 600 MG PO TABS: 600.0000 mg | ORAL_TABLET | Freq: Three times a day (TID) | ORAL | 0 refills | Status: DC | PRN

## 2023-03-25 MED ORDER — HYDROCORTISONE (PERIANAL) 2.5 % EX CREA: 1.0000 | TOPICAL_CREAM | Freq: Two times a day (BID) | CUTANEOUS | 0 refills | Status: DC

## 2023-03-25 NOTE — ED Triage Notes (Signed)
Here for rectal pain x 1 week. Pt states she feels a lump around her anus.

## 2023-03-25 NOTE — ED Provider Notes (Signed)
[facility]    CSN: [id number] Arrival date & time: 03/25/23  1839      History   Chief Complaint Chief Complaint  Patient presents with   Rectal Bleeding    HPI [name] is a 52 y.o. female.    Rectal Bleeding Here for rectal pain.  For the last 2 weeks she has had intermittent pain in her rectum.  She actually is not had any bleeding as stated by staff above,  Though she has had that in the past.  She has had some diarrhea in the last week or so.  No fever and no vomiting.  She reports a history of a stroke, but she is not taking any Plavix or other blood thinners. Past Medical History:  Diagnosis Date   Bilateral ovarian cysts    Chronic pelvic pain in female    Complication of anesthesia    Hemorrhoids    Hepatic steatosis 07/2006, 04/2014   on Abd CT done for pain   History of anal fissures    History of CVA (cerebrovascular accident) without residual deficits followed by pcp--- takes asa 81mg    04/ 2008--- due to cerebrovascular occlusion,  left basal ganglia subacute infarct;   per pt no residuals   History of ectopic pregnancy    2007--  treated with medication   History of hyperthyroidism    s/p  RAI   03-25-2001   Hydrosalpinx    Hypertension    followed by pcp   Hypothyroidism, postradioiodine therapy    followed by pcp---  multinodular goiter;   s/p  RAI 03-25-2001 for hyperthyroidism   PCOS (polycystic ovarian syndrome)    Pelvic adhesions    PONV (postoperative nausea and vomiting)    Seasonal asthma    followed by pcp--- no inhaler, takes singulair   Type II diabetes mellitus (HCC) dx'd 12/2015   followed by pcp ---   (11-06-2019  per pt does not check blood sugar at home)    Patient Active Problem List   Diagnosis Date Noted   Thrombocytopenia (HCC) 11/24/2022   History of acute pancreatitis 08/06/2022   Plantar fasciitis of left foot 01/22/2022   Breast mass 05/31/2021   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    Mass of occipital region 01/11/2017   Encounter for well adult exam with abnormal findings 01/11/2017   Type 2 diabetes mellitus with other specified complication (HCC) 10/12/2016   Endometriosis    Chronic female pelvic pain 05/07/2012   MIGRAINE HEADACHE 05/12/2007   History of ischemic stroke 12/12/2006   Hypothyroidism 10/31/2006   Dyslipidemia associated with type 2 diabetes mellitus (HCC) 10/31/2006   Obesity, Class I, BMI 30-34.9 10/31/2006   HYPERTENSION, BENIGN SYSTEMIC 10/31/2006   Asthma 10/31/2006    Past Surgical History:  Procedure Laterality Date   ABDOMINAL HYSTERECTOMY  04/24/2012   Procedure: HYSTERECTOMY ABDOMINAL;  Surgeon: Ok Edwards, MD;  Location: [facility];  Service: Gynecology;;   ANAL SPHINCTEROTOMY     APPENDECTOMY  1997   ruptured   CERVICAL CERCLAGE  11-06-2008;  10-07-2010  @[facility]    CESAREAN SECTION W/BTL Bilateral 12/2010   bilateral tubal ligation with filshie clips   COLONOSCOPY  05/2019   anal fissure, polyps (TA, HP), rpt 7 yrs ([facility] @ [facility])   ESOPHAGOGASTRODUODENOSCOPY  10/2014   WNL   EXCISION OF ENDOMETRIOMA N/A 08/20/2016   Procedure: EXCISION OF ENDOMETRIOMA LEFT LOWER QUADRANT;  Surgeon: Violeta Gelinas, MD;  Location: [facility];  Service:  General;  Laterality: N/A;   EXPLORATORY LAPAROTOMY  03/2004   WITH LYSIS ADHESIONS   FLEXIBLE SIGMOIDOSCOPY  10/2018   s/p botox injection for anal fissure ([facility] @ [facility])   LAPAROSCOPIC CHOLECYSTECTOMY  09/1995   LAPAROSCOPIC HYSTERECTOMY  04/24/2012   Procedure: HYSTERECTOMY TOTAL LAPAROSCOPIC;  Surgeon: Ok Edwards, MD;  Location: [facility];  Service: Gynecology;  Laterality: N/A;  2 1/2 hours OR time.  Dr. Audie Box to assisting    LAPAROSCOPIC LYSIS INTESTINAL ADHESIONS     LAPAROSCOPIC SALPINGO OOPHERECTOMY Bilateral 11/11/2019   diagnostic laparoscopy unsuccessful - extensive adhesions, unable to visualize adnexa (Anyanwu, Jethro Bastos, MD)    OB History     Gravida  5    Para  1   Term  0   Preterm  1   AB  3   Living         SAB  3   IAB  0   Ectopic  0   Multiple      Live Births               Home Medications    Prior to Admission medications   Medication Sig Start Date End Date Taking? Authorizing Provider  hydrocortisone (ANUSOL-HC) 2.5 % rectal cream Place 1 Application rectally 2 (two) times daily. 03/25/23  Yes Zenia Resides, MD  ibuprofen (ADVIL) 600 MG tablet Take 1 tablet (600 mg total) by mouth every 8 (eight) hours as needed (pain). 03/25/23  Yes Zenia Resides, MD  acetaminophen (TYLENOL) 500 MG tablet Take 1,000 mg by mouth as needed for moderate pain.     [provider]  albuterol (VENTOLIN HFA) 108 (90 Base) MCG/ACT inhaler INHALE 2 PUFFS INTO THE LUNGS EVERY 6 HOURS AS NEEDED FOR WHEEZING OR SHORTNESS OF BREATH 01/22/22 01/22/23  Eustaquio Boyden, MD  aspirin 81 MG tablet Take 1 tablet (81 mg total) by mouth daily. 01/11/17   Eustaquio Boyden, MD  atorvastatin (LIPITOR) 40 MG tablet Take 1 tablet (40 mg total) by mouth daily. 01/22/22   Eustaquio Boyden, MD  Calcium Citrate-Vitamin D (CALCIUM + D PO) Take 1 tablet by mouth daily.     [provider]  fluticasone (FLONASE) 50 MCG/ACT nasal spray Place 2 sprays into both nostrils daily. 10/01/20   Rushie Chestnut, PA-C  fluticasone (FLOVENT HFA) 44 MCG/ACT inhaler Inhale 2 puffs into the lungs in the morning and at bedtime. 02/15/22   Eustaquio Boyden, MD  levothyroxine (SYNTHROID) 150 MCG tablet Take 1 tablet by mouth every morning. 12/05/22   Eustaquio Boyden, MD  lisinopril-hydrochlorothiazide (ZESTORETIC) 20-25 MG tablet Take 1 tablet by mouth daily. 01/22/22   Eustaquio Boyden, MD  metFORMIN (GLUCOPHAGE-XR) 500 MG 24 hr tablet Take 2 tablets by mouth daily with breakfast. 02/09/22   Eustaquio Boyden, MD  montelukast (SINGULAIR) 10 MG tablet TAKE 1 TABLET BY MOUTH EVERY NIGHT AT BEDTIME 01/22/22 01/22/23  Eustaquio Boyden, MD  ondansetron  (ZOFRAN) 4 MG tablet Take 1 tablet (4 mg total) by mouth every 6 (six) hours as needed for nausea or vomiting. 08/01/22   Burgess Amor, PA-C  oxyCODONE-acetaminophen (PERCOCET/ROXICET) 5-325 MG tablet Take 1 tablet by mouth every 4 (four) hours as needed for severe pain. 08/01/22   Burgess Amor, PA-C  vitamin E 180 MG (400 UNITS) capsule Take 400 Units by mouth daily.    [provider]    Family History Family History  Problem Relation Age of Onset   Hypertension Father  Stroke Father        hemorrhagic, deceased   Diabetes Mother    Uterine cancer Mother    Coronary artery disease Neg Hx    Cancer Neg Hx     Social History Social History   Tobacco Use   Smoking status: Never   Smokeless tobacco: Never  Vaping Use   Vaping status: Never Used  Substance Use Topics   Alcohol use: No   Drug use: No     Allergies   Covid-19 (mrna) vaccine (pfizer) [covid-19 (mrna) vaccine], Meloxicam, Trulicity [dulaglutide], Amoxicillin, and Voltaren [diclofenac sodium]   Review of Systems Review of Systems  Gastrointestinal:  Positive for hematochezia.     Physical Exam Triage Vital Signs ED Triage Vitals  Encounter Vitals Group     BP 03/25/23 1937 (!) 146/96     Systolic BP Percentile --      Diastolic BP Percentile --      Pulse Rate 03/25/23 1936 91     Resp 03/25/23 1937 16     Temp 03/25/23 1936 98 F (36.7 C)     Temp Source 03/25/23 1936 Oral     SpO2 03/25/23 1935 97 %     Weight --      Height --      Head Circumference --      Peak Flow --      Pain Score --      Pain Loc --      Pain Education --      Exclude from Growth Chart --    No data found.  Updated Vital Signs BP (!) 146/96 (BP Location: Left Arm)   Pulse 91   Temp 98 F (36.7 C) (Oral)   Resp 16   LMP 11/12/2011   SpO2 97%   Visual Acuity Right Eye Distance:   Left Eye Distance:   Bilateral Distance:    Right Eye Near:   Left Eye Near:    Bilateral Near:     Physical  Exam Vitals reviewed.  Constitutional:      General: She is not in acute distress.    Appearance: She is not ill-appearing, toxic-appearing or diaphoretic.  Genitourinary:    Comments: There is an external hemorrhoid that is tender but not tense at the anterior aspect of her rectum.  It is about 1.5 cm in diameter. Skin:    Coloration: Skin is not jaundiced or pale.  Neurological:     General: No focal deficit present.     Mental Status: She is alert and oriented to person, place, and time.  Psychiatric:        Behavior: Behavior normal.      UC Treatments / Results  Labs (all labs ordered are listed, but only abnormal results are displayed) Labs Reviewed - No data to display  EKG   Radiology No results found.  Procedures Procedures (including critical care time)  Medications Ordered in UC Medications - No data to display  Initial Impression / Assessment and Plan / UC Course  I have reviewed the triage vital signs and the nursing notes.  Pertinent labs & imaging results that were available during my care of the patient were reviewed by me and considered in my medical decision making (see chart for details).        Anusol HC is sent in to apply to the external hemorrhoid, and ibuprofen 600 mg is sent in to use for a few days.  I could not give  her Toradol here as she has a rash to diclofenac. Final Clinical Impressions(s) / UC Diagnoses   Final diagnoses:  External hemorrhoid     Discharge Instructions       Apply Anusol HC cream to the hemorrhoids twice daily until better. (Applique la crema al hemorrhoide 2 veces al dia hasta mejor)  Take ibuprofen 600 mg--1 tab every 8 hours as needed for pain.  (Tome Ud. 600 mg --1 cada 8 horas cuando tiene dolor)         ED Prescriptions     Medication Sig Dispense Auth. Provider   ibuprofen (ADVIL) 600 MG tablet Take 1 tablet (600 mg total) by mouth every 8 (eight) hours as needed (pain). 15 tablet  Nikea Settle, Janace Aris, MD   hydrocortisone (ANUSOL-HC) 2.5 % rectal cream Place 1 Application rectally 2 (two) times daily. 30 g Zenia Resides, MD      I have reviewed the PDMP during this encounter.   Zenia Resides, MD 03/25/23 2010

## 2023-03-25 NOTE — Discharge Instructions (Signed)
  Apply Anusol HC cream to the hemorrhoids twice daily until better. (Applique la crema al hemorrhoide 2 veces al dia hasta mejor)  Take ibuprofen 600 mg--1 tab every 8 hours as needed for pain.  (Tome Ud. 600 mg --1 cada 8 horas cuando tiene dolor)

## 2023-03-27 ENCOUNTER — Other Ambulatory Visit: Payer: Self-pay | Admitting: Family Medicine

## 2023-03-27 DIAGNOSIS — E039 Hypothyroidism, unspecified: Secondary | ICD-10-CM

## 2023-03-27 DIAGNOSIS — E1169 Type 2 diabetes mellitus with other specified complication: Secondary | ICD-10-CM

## 2023-03-27 DIAGNOSIS — I10 Essential (primary) hypertension: Secondary | ICD-10-CM

## 2023-03-28 ENCOUNTER — Other Ambulatory Visit (HOSPITAL_COMMUNITY): Payer: Self-pay

## 2023-03-28 MED ORDER — LISINOPRIL-HYDROCHLOROTHIAZIDE 20-25 MG PO TABS
1.0000 | ORAL_TABLET | Freq: Every day | ORAL | 0 refills | Status: DC
Start: 2023-03-28 — End: 2023-04-07
  Filled 2023-03-28: qty 90, 90d supply, fill #0

## 2023-03-28 MED ORDER — ATORVASTATIN CALCIUM 40 MG PO TABS
40.0000 mg | ORAL_TABLET | Freq: Every day | ORAL | 0 refills | Status: DC
Start: 2023-03-28 — End: 2023-04-07
  Filled 2023-03-28: qty 90, 90d supply, fill #0

## 2023-03-28 MED ORDER — LEVOTHYROXINE SODIUM 150 MCG PO TABS
150.0000 ug | ORAL_TABLET | Freq: Every morning | ORAL | 0 refills | Status: DC
Start: 2023-03-28 — End: 2023-04-07
  Filled 2023-03-28: qty 90, 90d supply, fill #0

## 2023-03-28 MED ORDER — METFORMIN HCL ER 500 MG PO TB24
1000.0000 mg | ORAL_TABLET | Freq: Every day | ORAL | 0 refills | Status: DC
Start: 2023-03-28 — End: 2023-04-07
  Filled 2023-03-28: qty 180, 90d supply, fill #0

## 2023-03-28 NOTE — Telephone Encounter (Signed)
Noted  

## 2023-03-28 NOTE — Telephone Encounter (Signed)
Lvmtcb, sent mychart message  

## 2023-03-28 NOTE — Telephone Encounter (Signed)
Patient has been scheduled

## 2023-03-28 NOTE — Telephone Encounter (Signed)
E-scribed refills.  Plz schedule CPE and fasting lab (no food/drink- except water and/or blk coffee 5 hrs prior) visits for additional refills.  

## 2023-03-29 ENCOUNTER — Telehealth: Payer: Self-pay | Admitting: Family Medicine

## 2023-03-29 ENCOUNTER — Encounter: Payer: Self-pay | Admitting: Family Medicine

## 2023-03-29 ENCOUNTER — Ambulatory Visit (INDEPENDENT_AMBULATORY_CARE_PROVIDER_SITE_OTHER): Payer: BLUE CROSS/BLUE SHIELD | Admitting: Family Medicine

## 2023-03-29 ENCOUNTER — Other Ambulatory Visit (HOSPITAL_COMMUNITY): Payer: Self-pay

## 2023-03-29 ENCOUNTER — Other Ambulatory Visit: Payer: Self-pay

## 2023-03-29 VITALS — BP 138/80 | HR 70 | Temp 97.3°F | Ht 62.25 in | Wt 163.1 lb

## 2023-03-29 DIAGNOSIS — D696 Thrombocytopenia, unspecified: Secondary | ICD-10-CM

## 2023-03-29 DIAGNOSIS — M722 Plantar fascial fibromatosis: Secondary | ICD-10-CM

## 2023-03-29 DIAGNOSIS — E039 Hypothyroidism, unspecified: Secondary | ICD-10-CM

## 2023-03-29 DIAGNOSIS — E785 Hyperlipidemia, unspecified: Secondary | ICD-10-CM | POA: Diagnosis not present

## 2023-03-29 DIAGNOSIS — Z8673 Personal history of transient ischemic attack (TIA), and cerebral infarction without residual deficits: Secondary | ICD-10-CM

## 2023-03-29 DIAGNOSIS — Z6825 Body mass index (BMI) 25.0-25.9, adult: Secondary | ICD-10-CM | POA: Diagnosis not present

## 2023-03-29 DIAGNOSIS — Z23 Encounter for immunization: Secondary | ICD-10-CM

## 2023-03-29 DIAGNOSIS — J4540 Moderate persistent asthma, uncomplicated: Secondary | ICD-10-CM

## 2023-03-29 DIAGNOSIS — Z0000 Encounter for general adult medical examination without abnormal findings: Secondary | ICD-10-CM

## 2023-03-29 DIAGNOSIS — I10 Essential (primary) hypertension: Secondary | ICD-10-CM

## 2023-03-29 DIAGNOSIS — E1169 Type 2 diabetes mellitus with other specified complication: Secondary | ICD-10-CM | POA: Diagnosis not present

## 2023-03-29 LAB — COMPREHENSIVE METABOLIC PANEL
ALT: 36 U/L — ABNORMAL HIGH (ref 0–35)
AST: 19 U/L (ref 0–37)
Albumin: 4.2 g/dL (ref 3.5–5.2)
Alkaline Phosphatase: 102 U/L (ref 39–117)
BUN: 21 mg/dL (ref 6–23)
CO2: 29 mEq/L (ref 19–32)
Calcium: 9.5 mg/dL (ref 8.4–10.5)
Chloride: 100 mEq/L (ref 96–112)
Creatinine, Ser: 0.99 mg/dL (ref 0.40–1.20)
GFR: 65.61 mL/min (ref >60.00)
Glucose, Bld: 180 mg/dL — ABNORMAL HIGH (ref 70–99)
Potassium: 4.1 mEq/L (ref 3.5–5.1)
Sodium: 138 mEq/L (ref 135–145)
Total Bilirubin: 0.4 mg/dL (ref 0.2–1.2)
Total Protein: 7.2 g/dL (ref 6.0–8.3)

## 2023-03-29 LAB — LIPID PANEL
Cholesterol: 273 mg/dL — ABNORMAL HIGH (ref 0–200)
HDL: 35.9 mg/dL — ABNORMAL LOW (ref >39.00)
NonHDL: 236.98
Total CHOL/HDL Ratio: 8
Triglycerides: 236 mg/dL — ABNORMAL HIGH (ref 0.0–149.0)
VLDL: 47.2 mg/dL — ABNORMAL HIGH (ref 0.0–40.0)

## 2023-03-29 LAB — CBC WITH DIFFERENTIAL/PLATELET
Basophils Absolute: 0.1 10*3/uL (ref 0.0–0.1)
Basophils Relative: 1.2 % (ref 0.0–3.0)
Eosinophils Absolute: 0.2 10*3/uL (ref 0.0–0.7)
Eosinophils Relative: 4.1 % (ref 0.0–5.0)
HCT: 43.3 % (ref 36.0–46.0)
Hemoglobin: 13.7 g/dL (ref 12.0–15.0)
Lymphocytes Relative: 34.8 % (ref 12.0–46.0)
Lymphs Abs: 1.9 10*3/uL (ref 0.7–4.0)
MCHC: 31.6 g/dL (ref 30.0–36.0)
MCV: 90.3 fl (ref 78.0–100.0)
Monocytes Absolute: 0.5 10*3/uL (ref 0.1–1.0)
Monocytes Relative: 8.9 % (ref 3.0–12.0)
Neutro Abs: 2.8 10*3/uL (ref 1.4–7.7)
Neutrophils Relative %: 51 % (ref 43.0–77.0)
Platelets: 123 10*3/uL — ABNORMAL LOW (ref 150.0–400.0)
RBC: 4.79 Mil/uL (ref 3.87–5.11)
RDW: 13.6 % (ref 11.5–15.5)
WBC: 5.5 10*3/uL (ref 4.0–10.5)

## 2023-03-29 LAB — MICROALBUMIN / CREATININE URINE RATIO
Creatinine,U: 233 mg/dL
Microalb Creat Ratio: 0.7 mg/g (ref 0.0–30.0)
Microalb, Ur: 1.7 mg/dL (ref 0.0–1.9)

## 2023-03-29 LAB — HEMOGLOBIN A1C: Hgb A1c MFr Bld: 8.9 % — ABNORMAL HIGH (ref 4.6–6.5)

## 2023-03-29 LAB — TSH: TSH: 10.84 u[IU]/mL — ABNORMAL HIGH (ref 0.35–5.50)

## 2023-03-29 LAB — LDL CHOLESTEROL, DIRECT: Direct LDL: 181 mg/dL

## 2023-03-29 MED ORDER — METFORMIN HCL ER 500 MG PO TB24
1000.0000 mg | ORAL_TABLET | Freq: Every day | ORAL | 4 refills | Status: DC
  Filled 2023-11-17 – 2023-11-28 (×2): qty 180, 90d supply, fill #0

## 2023-03-29 MED ORDER — LEVOTHYROXINE SODIUM 150 MCG PO TABS
150.0000 ug | ORAL_TABLET | Freq: Every morning | ORAL | 4 refills | Status: DC
Start: 2023-03-29 — End: 2023-04-07

## 2023-03-29 MED ORDER — QVAR REDIHALER 40 MCG/ACT IN AERB
1.0000 | INHALATION_SPRAY | Freq: Two times a day (BID) | RESPIRATORY_TRACT | 11 refills | Status: DC
  Filled 2023-03-29: qty 1, fill #0

## 2023-03-29 MED ORDER — LANCET DEVICE MISC
1.0000 | Freq: Three times a day (TID) | 0 refills | Status: AC
  Filled 2023-03-29: qty 1, 30d supply, fill #0

## 2023-03-29 MED ORDER — ALBUTEROL SULFATE HFA 108 (90 BASE) MCG/ACT IN AERS
2.0000 | INHALATION_SPRAY | Freq: Four times a day (QID) | RESPIRATORY_TRACT | 3 refills | Status: DC | PRN
  Filled 2023-03-29: qty 6.7, 25d supply, fill #0

## 2023-03-29 MED ORDER — ATORVASTATIN CALCIUM 40 MG PO TABS
40.0000 mg | ORAL_TABLET | Freq: Every day | ORAL | 4 refills | Status: DC
Start: 2023-03-29 — End: 2023-04-07

## 2023-03-29 MED ORDER — MONTELUKAST SODIUM 10 MG PO TABS
10.0000 mg | ORAL_TABLET | Freq: Every day | ORAL | 4 refills | Status: DC
  Filled 2023-03-29: qty 90, 90d supply, fill #0

## 2023-03-29 MED ORDER — QVAR REDIHALER 80 MCG/ACT IN AERB
1.0000 | INHALATION_SPRAY | Freq: Two times a day (BID) | RESPIRATORY_TRACT | 11 refills | Status: DC
  Filled 2023-03-29: qty 10.6, 30d supply, fill #0
  Filled 2023-03-29: qty 1, fill #0

## 2023-03-29 MED ORDER — ACCU-CHEK SOFTCLIX LANCETS MISC
1.0000 | Freq: Three times a day (TID) | 0 refills | Status: AC
  Filled 2023-03-29: qty 100, 30d supply, fill #0
  Filled 2023-03-29: qty 100, 34d supply, fill #0

## 2023-03-29 MED ORDER — VITAMIN B-12 1000 MCG PO TABS: 1000.0000 ug | ORAL_TABLET | Freq: Every day | ORAL | Status: AC

## 2023-03-29 MED ORDER — BLOOD GLUCOSE TEST VI STRP
1.0000 | ORAL_STRIP | Freq: Three times a day (TID) | 0 refills | Status: AC
  Filled 2023-03-29: qty 50, 17d supply, fill #0

## 2023-03-29 MED ORDER — BLOOD GLUCOSE MONITOR SYSTEM W/DEVICE KIT
1.0000 | PACK | Freq: Three times a day (TID) | 0 refills | Status: AC
  Filled 2023-03-29: qty 1, 30d supply, fill #0

## 2023-03-29 MED ORDER — FLUTICASONE PROPIONATE HFA 44 MCG/ACT IN AERO
2.0000 | INHALATION_SPRAY | Freq: Two times a day (BID) | RESPIRATORY_TRACT | 12 refills | Status: DC
Start: 2023-03-29 — End: 2023-03-29
  Filled 2023-03-29: qty 10.6, 30d supply, fill #0

## 2023-03-29 MED ORDER — LISINOPRIL-HYDROCHLOROTHIAZIDE 20-25 MG PO TABS
1.0000 | ORAL_TABLET | Freq: Every day | ORAL | 4 refills | Status: DC
Start: 2023-03-29 — End: 2024-06-16
  Filled 2023-07-20: qty 90, 90d supply, fill #0
  Filled 2023-11-17 – 2023-11-28 (×2): qty 90, 90d supply, fill #1
  Filled 2024-02-16: qty 90, 90d supply, fill #2

## 2023-03-29 NOTE — Addendum Note (Signed)
Addended by: Eustaquio Boyden on: 03/29/2023 02:05 PM   Modules accepted: Orders

## 2023-03-29 NOTE — Telephone Encounter (Addendum)
Plz notify pt and pharmacy I've sent in Qvar as replacement for inhaled corticosteroid

## 2023-03-29 NOTE — Assessment & Plan Note (Signed)
Chronic, reviewed rescue vs controller use.

## 2023-03-29 NOTE — Assessment & Plan Note (Signed)
Chronic, stable on atorvastatin - update FLP and LFTs The ASCVD Risk score (Arnett DK, et al., 2019) failed to calculate for the following reasons:   The patient has a prior MI or stroke diagnosis

## 2023-03-29 NOTE — Telephone Encounter (Signed)
Spoke with [facility] pharmacy about Qvar rx. Confirms they received it but didn't have 80 mcg on hand. Had to get it from another location and are waiting for it to arrive today or first thing tomorrow morning [open Sat 8z-4:30p].    Spoke with pt relaying info above and recommended she call pharmacy before going to pick up med. She verbalizes understanding and expresses there thanks for letting her know.

## 2023-03-29 NOTE — Patient Instructions (Addendum)
Laboratorios hoy  Primera vacuna contra shingles hoy. Haga cita con enfermera en 2-6 meses para segunda vacuna.  Regresar a podiatra para re evaluar dolor de pie izquierdo.  Para asthma - use flovent 2 inhalaciones dos veces al dia diario (medicina de control), y use albuterol inhalacion cuando lo necesite (medicina de rescate). Siga singulair diario tambien.  Llame para mamograma: [facility] [phone] Gusto verla hoy.  Haga cita para examen diabetico de ojos.  Regresar en 6 meses para control de diabetes.

## 2023-03-29 NOTE — Assessment & Plan Note (Signed)
Preventative protocols reviewed and updated unless pt declined. Discussed healthy diet and lifestyle.  

## 2023-03-29 NOTE — Assessment & Plan Note (Signed)
Chronic, stable on lisinopril/hydrochlorothiazide.

## 2023-03-29 NOTE — Assessment & Plan Note (Signed)
Chronic issue despite completing plantar fasciitis surgery 08/2022. Encouraged f/u with podiatry

## 2023-03-29 NOTE — Telephone Encounter (Signed)
Pharmacy contacted the office regarding medication Flovent inhaler sent in by Dr. Theressa Stamps states it is not covered under patient's insurance. Caller also states she ran a test claim on QVAR inhaler and it was covered by insurance, wants to know if this could be sent as replacement? Please advise, thank you.

## 2023-03-29 NOTE — Progress Notes (Signed)
Ph: [phone] Fax: [phone]   Patient ID: [name], female    DOB: 01/25/1971, 52 y.o.   MRN: [id number]  This visit was conducted in person.  BP 138/80   Pulse 70   Temp (!) 97.3 F (36.3 C) (Temporal)   Ht 5' 2.25" (1.581 m)   Wt 163 lb 2 oz (74 kg)   LMP 11/12/2011   SpO2 98%   BMI 29.60 kg/m    CC: CPE Subjective:   HPI: [name] is a 52 y.o. female presenting on 03/29/2023 for Annual Exam   S/p L plantar fasciitis foot surgery ([facility] in [place]) 08/2022. Notes ongoing L foot pain, regularly wears ankle sleeve brace. Never received phone call for PT.   Recently seen at [facility] for external hemorrhoid - treating with anusol HC and ibuprofen - this is some better.   DM - continues metformin XR 1000mg  daily. Pancreatitis to GLP1RA (Trulicity). Does not have glucometer.   HLD - on atorvastatin 40mg  daily - ran out 2 days ago.   Asthma - intermittent dyspnea with wheezing since COVID 2022. On albuterol inh PRN - uses twice weekly.    Preventative: COLONOSCOPY 10/2014 1 hyperplastic polyp, rpt 10 yrs [facility]) Colonoscopy 02/2019 - anal fissure, polyps (adenoma), rpt 7 yrs ([place] @ [facility]) Well woman - s/p hysterectomy 2013 for heavy bleeding. No vaginal bleeding.  Mammogram 11/2021 - [facility] [place] - due for rpt  Lung cancer screening - not eligible  Flu shot - intermittent COVID vaccine - Pfizer 12/2019, 01/2020, no booster Td 2003, Tdap 01/2017  Shingrix - discussed, start today  Seat belt use discussed  Sunscreen use discussed. No changing moles on skin.  Non smoker  No alcohol  Dentist - yearly - due Eye exam - due    Caffeine: none Lives with husband and 1 daughter (2012) Occupation: working part time Agricultural engineer  Edu: 9th grade Activity: walks daily 1/2-1 hour Diet: good water, fruits/vegetables daily     Relevant past medical, surgical, family and social history reviewed and  updated as indicated. Interim medical history since our last visit reviewed. Allergies and medications reviewed and updated. Outpatient Medications Prior to Visit  Medication Sig Dispense Refill   acetaminophen (TYLENOL) 500 MG tablet Take 1,000 mg by mouth as needed for moderate pain.      aspirin 81 MG tablet Take 1 tablet (81 mg total) by mouth daily.     Calcium Citrate-Vitamin D (CALCIUM + D PO) Take 1 tablet by mouth daily.      fluticasone (FLONASE) 50 MCG/ACT nasal spray Place 2 sprays into both nostrils daily. 16 mL 0   hydrocortisone (ANUSOL-HC) 2.5 % rectal cream Place 1 Application rectally 2 (two) times daily. 30 g 0   vitamin E 180 MG (400 UNITS) capsule Take 400 Units by mouth daily.     albuterol (VENTOLIN HFA) 108 (90 Base) MCG/ACT inhaler INHALE 2 PUFFS INTO THE LUNGS EVERY 6 HOURS AS NEEDED FOR WHEEZING OR SHORTNESS OF BREATH 6.7 g 3   atorvastatin (LIPITOR) 40 MG tablet Take 1 tablet (40 mg total) by mouth daily. 90 tablet 0   fluticasone (FLOVENT HFA) 44 MCG/ACT inhaler Inhale 2 puffs into the lungs in the morning and at bedtime. 10.6 g 12   ibuprofen (ADVIL) 600 MG tablet Take 1 tablet (600 mg total) by mouth every 8 (eight) hours as needed (pain). 15 tablet 0   levothyroxine (SYNTHROID) 150 MCG tablet  Take 1 tablet by mouth every morning. 90 tablet 0   lisinopril-hydrochlorothiazide (ZESTORETIC) 20-25 MG tablet Take 1 tablet by mouth daily. 90 tablet 0   metFORMIN (GLUCOPHAGE-XR) 500 MG 24 hr tablet Take 2 tablets by mouth daily with breakfast. 180 tablet 0   ondansetron (ZOFRAN) 4 MG tablet Take 1 tablet (4 mg total) by mouth every 6 (six) hours as needed for nausea or vomiting. 12 tablet 0   oxyCODONE-acetaminophen (PERCOCET/ROXICET) 5-325 MG tablet Take 1 tablet by mouth every 4 (four) hours as needed for severe pain. 24 tablet 0   montelukast (SINGULAIR) 10 MG tablet TAKE 1 TABLET BY MOUTH EVERY NIGHT AT BEDTIME 90 tablet 3   No facility-administered medications prior  to visit.     Per HPI unless specifically indicated in ROS section below Review of Systems  Constitutional:  Negative for activity change, appetite change, chills, fatigue, fever and unexpected weight change.  HENT:  Negative for hearing loss.   Eyes:  Negative for visual disturbance.  Respiratory:  Positive for wheezing (when exposed to dust/grass). Negative for cough, chest tightness and shortness of breath.   Cardiovascular:  Negative for chest pain, palpitations and leg swelling.  Gastrointestinal:  Positive for diarrhea (with recent hemorrhoid). Negative for abdominal distention, abdominal pain, blood in stool, constipation, nausea and vomiting.  Genitourinary:  Negative for difficulty urinating and hematuria.  Musculoskeletal:  Negative for arthralgias, myalgias and neck pain.  Skin:  Negative for rash.  Neurological:  Negative for dizziness, seizures, syncope and headaches.  Hematological:  Negative for adenopathy. Does not bruise/bleed easily.  Psychiatric/Behavioral:  Negative for dysphoric mood. The patient is not nervous/anxious.     Objective:  BP 138/80   Pulse 70   Temp (!) 97.3 F (36.3 C) (Temporal)   Ht 5' 2.25" (1.581 m)   Wt 163 lb 2 oz (74 kg)   LMP 11/12/2011   SpO2 98%   BMI 29.60 kg/m   Wt Readings from Last 3 Encounters:  03/29/23 163 lb 2 oz (74 kg)  08/06/22 166 lb 9.6 oz (75.6 kg)  08/01/22 172 lb (78 kg)      Physical Exam Vitals and nursing note reviewed.  Constitutional:      Appearance: Normal appearance. She is not ill-appearing.  HENT:     Head: Normocephalic and atraumatic.     Right Ear: Tympanic membrane, ear canal and external ear normal. There is no impacted cerumen.     Left Ear: Tympanic membrane, ear canal and external ear normal. There is no impacted cerumen.     Mouth/Throat:     Mouth: Mucous membranes are moist.     Pharynx: Oropharynx is clear. No oropharyngeal exudate or posterior oropharyngeal erythema.  Eyes:      General:        Right eye: No discharge.        Left eye: No discharge.     Extraocular Movements: Extraocular movements intact.     Conjunctiva/sclera: Conjunctivae normal.     Pupils: Pupils are equal, round, and reactive to light.  Neck:     Thyroid: No thyroid mass or thyromegaly.  Cardiovascular:     Rate and Rhythm: Normal rate and regular rhythm.     Pulses: Normal pulses.     Heart sounds: Normal heart sounds. No murmur heard. Pulmonary:     Effort: Pulmonary effort is normal. No respiratory distress.     Breath sounds: Normal breath sounds. No wheezing, rhonchi or rales.  Abdominal:  General: Bowel sounds are normal. There is no distension.     Palpations: Abdomen is soft. There is no mass.     Tenderness: There is no abdominal tenderness. There is no guarding or rebound.     Hernia: No hernia is present.  Musculoskeletal:     Cervical back: Normal range of motion and neck supple. No rigidity.     Right lower leg: No edema.     Left lower leg: No edema.  Lymphadenopathy:     Cervical: No cervical adenopathy.  Skin:    General: Skin is warm and dry.     Findings: No rash.  Neurological:     General: No focal deficit present.     Mental Status: She is alert. Mental status is at baseline.  Psychiatric:        Mood and Affect: Mood normal.        Behavior: Behavior normal.       Results for orders placed or performed in visit on 08/06/22  Lipid panel  Result Value Ref Range   Cholesterol 242 (H) 0 - 200 mg/dL   Triglycerides 409.8 (H) 0.0 - 149.0 mg/dL   HDL 11.91 (L) >47.82 mg/dL   VLDL 95.6 (H) 0.0 - 21.3 mg/dL   Total CHOL/HDL Ratio 6    NonHDL 204.31   Lipase  Result Value Ref Range   Lipase 39.0 11.0 - 59.0 U/L  Comprehensive metabolic panel  Result Value Ref Range   Sodium 138 135 - 145 mEq/L   Potassium 3.9 3.5 - 5.1 mEq/L   Chloride 99 96 - 112 mEq/L   CO2 29 19 - 32 mEq/L   Glucose, Bld 154 (H) 70 - 99 mg/dL   BUN 20 6 - 23 mg/dL    Creatinine, Ser 0.86 0.40 - 1.20 mg/dL   Total Bilirubin 0.3 0.2 - 1.2 mg/dL   Alkaline Phosphatase 110 39 - 117 U/L   AST 19 0 - 37 U/L   ALT 47 (H) 0 - 35 U/L   Total Protein 7.7 6.0 - 8.3 g/dL   Albumin 4.6 3.5 - 5.2 g/dL   GFR 57.84 >69.62 mL/min   Calcium 9.8 8.4 - 10.5 mg/dL  CBC with Differential/Platelet  Result Value Ref Range   WBC 7.3 4.0 - 10.5 K/uL   RBC 5.10 3.87 - 5.11 Mil/uL   Hemoglobin 15.2 (H) 12.0 - 15.0 g/dL   HCT 95.2 84.1 - 32.4 %   MCV 89.1 78.0 - 100.0 fl   MCHC 33.4 30.0 - 36.0 g/dL   RDW 40.1 02.7 - 25.3 %   Platelets 133.0 (L) 150.0 - 400.0 K/uL   Neutrophils Relative % 66.6 43.0 - 77.0 %   Lymphocytes Relative 24.2 12.0 - 46.0 %   Monocytes Relative 7.2 3.0 - 12.0 %   Eosinophils Relative 1.3 0.0 - 5.0 %   Basophils Relative 0.7 0.0 - 3.0 %   Neutro Abs 4.9 1.4 - 7.7 K/uL   Lymphs Abs 1.8 0.7 - 4.0 K/uL   Monocytes Absolute 0.5 0.1 - 1.0 K/uL   Eosinophils Absolute 0.1 0.0 - 0.7 K/uL   Basophils Absolute 0.1 0.0 - 0.1 K/uL  LDL cholesterol, direct  Result Value Ref Range   Direct LDL 170.0 mg/dL    Assessment & Plan:   Problem List Items Addressed This Visit     Health maintenance examination - Primary (Chronic)    Preventative protocols reviewed and updated unless pt declined. Discussed healthy diet and lifestyle.  Hypothyroidism    Chronic, update levothyroxine - update TSH today.       Relevant Medications   levothyroxine (SYNTHROID) 150 MCG tablet   Dyslipidemia associated with type 2 diabetes mellitus (HCC)    Chronic, stable on atorvastatin - update FLP and LFTs The ASCVD Risk score (Arnett DK, et al., 2019) failed to calculate for the following reasons:   The patient has a prior MI or stroke diagnosis       Relevant Medications   atorvastatin (LIPITOR) 40 MG tablet   lisinopril-hydrochlorothiazide (ZESTORETIC) 20-25 MG tablet   metFORMIN (GLUCOPHAGE-XR) 500 MG 24 hr tablet   Body mass index (BMI) 25.0-25.9, adult     Weight loss noted - with BMI <30.       HYPERTENSION, BENIGN SYSTEMIC    Chronic, stable on lisinopril/hydrochlorothiazide.       Relevant Medications   atorvastatin (LIPITOR) 40 MG tablet   lisinopril-hydrochlorothiazide (ZESTORETIC) 20-25 MG tablet   History of ischemic stroke    Continue statin, aspirin.       Asthma    Chronic, reviewed rescue vs controller use.       Relevant Medications   montelukast (SINGULAIR) 10 MG tablet   albuterol (VENTOLIN HFA) 108 (90 Base) MCG/ACT inhaler   fluticasone (FLOVENT HFA) 44 MCG/ACT inhaler   Type 2 diabetes mellitus with other specified complication (HCC)    Chronic, stable on metformin XR - update labs.       Relevant Medications   atorvastatin (LIPITOR) 40 MG tablet   lisinopril-hydrochlorothiazide (ZESTORETIC) 20-25 MG tablet   metFORMIN (GLUCOPHAGE-XR) 500 MG 24 hr tablet   Other Relevant Orders   Ambulatory referral to Ophthalmology   Plantar fasciitis of left foot    Chronic issue despite completing plantar fasciitis surgery 08/2022. Encouraged f/u with podiatry      Thrombocytopenia (HCC)    Update CBC.       Other Visit Diagnoses     Need for shingles vaccine       Relevant Orders   Varicella-zoster vaccine IM (Completed)        Meds ordered this encounter  Medications   Blood Glucose Monitoring Suppl (BLOOD GLUCOSE MONITOR SYSTEM) w/Device KIT    Sig: Use to test blood sugar in the morning, at noon, and at bedtime.    Dispense:  1 kit    Refill:  0   Glucose Blood (BLOOD GLUCOSE TEST STRIPS) STRP    Sig: Use to test blood sugar in the morning, at noon, and at bedtime.    Dispense:  90 each    Refill:  0   Lancet Device MISC    Sig: 1 each by Does not apply route in the morning, at noon, and at bedtime. May substitute to any manufacturer covered by patient's insurance.    Dispense:  1 each    Refill:  0   Accu-Chek Softclix Lancets lancets    Sig: Use to test blood sugar in the morning, at noon, and  at bedtime.    Dispense:  100 each    Refill:  0   atorvastatin (LIPITOR) 40 MG tablet    Sig: Take 1 tablet (40 mg total) by mouth daily.    Dispense:  90 tablet    Refill:  4   levothyroxine (SYNTHROID) 150 MCG tablet    Sig: Take 1 tablet by mouth every morning.    Dispense:  90 tablet    Refill:  4   lisinopril-hydrochlorothiazide (ZESTORETIC) 20-25 MG  tablet    Sig: Take 1 tablet by mouth daily.    Dispense:  90 tablet    Refill:  4   metFORMIN (GLUCOPHAGE-XR) 500 MG 24 hr tablet    Sig: Take 2 tablets by mouth daily with breakfast.    Dispense:  180 tablet    Refill:  4   montelukast (SINGULAIR) 10 MG tablet    Sig: Take 1 tablet (10 mg total) by mouth at bedtime.    Dispense:  90 tablet    Refill:  4   albuterol (VENTOLIN HFA) 108 (90 Base) MCG/ACT inhaler    Sig: INHALE 2 PUFFS INTO THE LUNGS EVERY 6 HOURS AS NEEDED FOR WHEEZING OR SHORTNESS OF BREATH    Dispense:  6.7 g    Refill:  3   fluticasone (FLOVENT HFA) 44 MCG/ACT inhaler    Sig: Inhale 2 puffs into the lungs in the morning and at bedtime.    Dispense:  10.6 g    Refill:  12   cyanocobalamin (VITAMIN B12) 1000 MCG tablet    Sig: Take 1 tablet (1,000 mcg total) by mouth daily.    Orders Placed This Encounter  Procedures   Varicella-zoster vaccine IM   Ambulatory referral to Ophthalmology    Referral Priority:   Routine    Referral Type:   Consultation    Referral Reason:   Specialty Services Required    Requested Specialty:   Ophthalmology    Number of Visits Requested:   1    Patient Instructions  Laboratorios hoy  Primera vacuna contra shingles hoy. Haga cita con enfermera en 2-6 meses para segunda vacuna.  Regresar a podiatra para re evaluar dolor de pie izquierdo.  Para asthma - use flovent 2 inhalaciones dos veces al dia diario (medicina de control), y use albuterol inhalacion cuando lo necesite (medicina de rescate). Siga singulair diario tambien.  Llame para mamograma: [facility] [phone] Gusto verla hoy.  Haga cita para examen diabetico de ojos.  Regresar en 6 meses para control de diabetes.   Follow up plan: Return in about 6 months (around 09/29/2023) for follow up visit.  Eustaquio Boyden, MD

## 2023-03-29 NOTE — Assessment & Plan Note (Addendum)
Update CBC. 

## 2023-03-29 NOTE — Assessment & Plan Note (Signed)
Continue statin, aspirin 

## 2023-03-29 NOTE — Assessment & Plan Note (Signed)
Weight loss noted - with BMI <30.

## 2023-03-29 NOTE — Assessment & Plan Note (Signed)
Chronic, update levothyroxine - update TSH today.

## 2023-03-29 NOTE — Assessment & Plan Note (Signed)
Chronic, stable on metformin XR - update labs.

## 2023-03-30 ENCOUNTER — Other Ambulatory Visit (HOSPITAL_COMMUNITY): Payer: Self-pay

## 2023-04-05 ENCOUNTER — Other Ambulatory Visit (HOSPITAL_COMMUNITY): Payer: Self-pay

## 2023-04-07 ENCOUNTER — Other Ambulatory Visit (HOSPITAL_COMMUNITY): Payer: Self-pay

## 2023-04-07 ENCOUNTER — Other Ambulatory Visit: Payer: Self-pay | Admitting: Family Medicine

## 2023-04-07 DIAGNOSIS — E039 Hypothyroidism, unspecified: Secondary | ICD-10-CM

## 2023-04-07 DIAGNOSIS — E1169 Type 2 diabetes mellitus with other specified complication: Secondary | ICD-10-CM

## 2023-04-07 MED ORDER — LEVOTHYROXINE SODIUM 175 MCG PO TABS
175.0000 ug | ORAL_TABLET | Freq: Every morning | ORAL | 4 refills | Status: DC
Start: 2023-04-07 — End: 2023-07-13
  Filled 2023-04-07: qty 90, 90d supply, fill #0

## 2023-04-07 MED ORDER — ATORVASTATIN CALCIUM 80 MG PO TABS
80.0000 mg | ORAL_TABLET | Freq: Every day | ORAL | 3 refills | Status: DC
  Filled 2023-04-07: qty 90, 90d supply, fill #0

## 2023-04-07 MED ORDER — PIOGLITAZONE HCL 15 MG PO TABS
15.0000 mg | ORAL_TABLET | Freq: Every day | ORAL | 6 refills | Status: DC
  Filled 2023-04-07: qty 30, 30d supply, fill #0

## 2023-04-08 ENCOUNTER — Other Ambulatory Visit (HOSPITAL_COMMUNITY): Payer: Self-pay

## 2023-04-10 ENCOUNTER — Other Ambulatory Visit (HOSPITAL_COMMUNITY): Payer: Self-pay

## 2023-05-08 ENCOUNTER — Encounter (HOSPITAL_COMMUNITY): Payer: Self-pay

## 2023-05-08 ENCOUNTER — Ambulatory Visit (HOSPITAL_COMMUNITY)
Admission: EM | Admit: 2023-05-08 | Discharge: 2023-05-08 | Disposition: A | Discharge status: Home / Self Care | Payer: BLUE CROSS/BLUE SHIELD | Attending: Emergency Medicine | Admitting: Emergency Medicine

## 2023-05-08 DIAGNOSIS — M79671 Pain in right foot: Secondary | ICD-10-CM | POA: Diagnosis not present

## 2023-05-08 MED ORDER — KETOROLAC TROMETHAMINE 30 MG/ML IJ SOLN
INTRAMUSCULAR | Status: AC
  Filled 2023-05-08: qty 1

## 2023-05-08 MED ORDER — KETOROLAC TROMETHAMINE 30 MG/ML IJ SOLN
30.0000 mg | Freq: Once | INTRAMUSCULAR | Status: AC
  Administered 2023-05-08: 30 mg via INTRAMUSCULAR

## 2023-05-08 NOTE — ED Provider Notes (Signed)
[facility]    CSN: [id number] Arrival date & time: 05/08/23  1812      History   Chief Complaint Chief Complaint  Patient presents with   Foot Pain    HPI [name] is a 52 y.o. female.    Patient presents to clinic for bilateral heel pain for the past 3 months. Pain is worse on her right side of her foot.   Mostly on the R side, had an operation for plantar fascitis to the left side so this foot hurts less. Reports pain with standing.   She does have inserts that she has been wearing for her PF of her left foot.   Denies injuries, trauma or falls.   Has tried Tylenol without much relief.   Does have an orthopedic in [place].   The history is provided by the patient and medical records. The history is limited by a language barrier. A language interpreter was used.  Foot Pain    Past Medical History:  Diagnosis Date   Bilateral ovarian cysts    Chronic pelvic pain in female    Complication of anesthesia    Hemorrhoids    Hepatic steatosis 07/2006, 04/2014   on Abd CT done for pain   History of anal fissures    History of CVA (cerebrovascular accident) without residual deficits followed by pcp--- takes asa 81mg    04/ 2008--- due to cerebrovascular occlusion,  left basal ganglia subacute infarct;   per pt no residuals   History of ectopic pregnancy    2007--  treated with medication   History of hyperthyroidism    s/p  RAI   03-25-2001   Hydrosalpinx    Hypertension    followed by pcp   Hypothyroidism, postradioiodine therapy    followed by pcp---  multinodular goiter;   s/p  RAI 03-25-2001 for hyperthyroidism   PCOS (polycystic ovarian syndrome)    Pelvic adhesions    PONV (postoperative nausea and vomiting)    Seasonal asthma    followed by pcp--- no inhaler, takes singulair   Type II diabetes mellitus (HCC) dx'd 12/2015   followed by pcp ---   (11-06-2019  per pt does not check blood sugar at home)    Patient Active  Problem List   Diagnosis Date Noted   Thrombocytopenia (HCC) 11/24/2022   History of acute pancreatitis 08/06/2022   Plantar fasciitis of left foot 01/22/2022   Breast mass 05/31/2021   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   Mass of occipital region 01/11/2017   Health maintenance examination 01/11/2017   Type 2 diabetes mellitus with other specified complication (HCC) 10/12/2016   Endometriosis    Chronic female pelvic pain 05/07/2012   MIGRAINE HEADACHE 05/12/2007   History of ischemic stroke 12/12/2006   Hypothyroidism 10/31/2006   Dyslipidemia associated with type 2 diabetes mellitus (HCC) 10/31/2006   Body mass index (BMI) 25.0-25.9, adult 10/31/2006   HYPERTENSION, BENIGN SYSTEMIC 10/31/2006   Asthma 10/31/2006    Past Surgical History:  Procedure Laterality Date   ABDOMINAL HYSTERECTOMY  04/24/2012   Procedure: HYSTERECTOMY ABDOMINAL;  Surgeon: Ok Edwards, MD;  Location: [facility];  Service: Gynecology;;   ANAL SPHINCTEROTOMY     APPENDECTOMY  1997   ruptured   CERVICAL CERCLAGE  11-06-2008;  10-07-2010  @[facility]    CESAREAN SECTION W/BTL Bilateral 12/2010   bilateral tubal ligation with filshie clips   COLONOSCOPY  05/2019   anal fissure, polyps (TA, HP), rpt 7 yrs Robley Fries @  [facility])   ESOPHAGOGASTRODUODENOSCOPY  10/2014   WNL   EXCISION OF ENDOMETRIOMA N/A 08/20/2016   Procedure: EXCISION OF ENDOMETRIOMA LEFT LOWER QUADRANT;  Surgeon: Violeta Gelinas, MD;  Location: [facility];  Service: General;  Laterality: N/A;   EXPLORATORY LAPAROTOMY  03/2004   WITH LYSIS ADHESIONS   FLEXIBLE SIGMOIDOSCOPY  10/2018   s/p botox injection for anal fissure ([place] @ [facility])   LAPAROSCOPIC CHOLECYSTECTOMY  09/1995   LAPAROSCOPIC HYSTERECTOMY  04/24/2012   Procedure: HYSTERECTOMY TOTAL LAPAROSCOPIC;  Surgeon: Ok Edwards, MD;  Location: [facility];  Service: Gynecology;  Laterality: N/A;  2 1/2 hours OR time.  Dr. Audie Box to assisting    LAPAROSCOPIC LYSIS INTESTINAL  ADHESIONS     LAPAROSCOPIC SALPINGO OOPHERECTOMY Bilateral 11/11/2019   diagnostic laparoscopy unsuccessful - extensive adhesions, unable to visualize adnexa (Anyanwu, Jethro Bastos, MD)    OB History     Gravida  5   Para  1   Term  0   Preterm  1   AB  3   Living         SAB  3   IAB  0   Ectopic  0   Multiple      Live Births               Home Medications    Prior to Admission medications   Medication Sig Start Date End Date Taking? Authorizing Provider  acetaminophen (TYLENOL) 500 MG tablet Take 1,000 mg by mouth as needed for moderate pain.     [provider]  albuterol (VENTOLIN HFA) 108 (90 Base) MCG/ACT inhaler INHALE 2 PUFFS INTO THE LUNGS EVERY 6 HOURS AS NEEDED FOR WHEEZING OR SHORTNESS OF BREATH 03/29/23 03/28/24  Eustaquio Boyden, MD  aspirin 81 MG tablet Take 1 tablet (81 mg total) by mouth daily. 01/11/17   Eustaquio Boyden, MD  atorvastatin (LIPITOR) 80 MG tablet Take 1 tablet (80 mg total) by mouth daily. 04/07/23   Eustaquio Boyden, MD  beclomethasone (QVAR REDIHALER) 80 MCG/ACT inhaler Inhale 1 puff into the lungs 2 (two) times daily. 03/29/23   Eustaquio Boyden, MD  Blood Glucose Monitoring Suppl (BLOOD GLUCOSE MONITOR SYSTEM) w/Device KIT Use to test blood sugar in the morning, at noon, and at bedtime. 03/29/23   Eustaquio Boyden, MD  Calcium Citrate-Vitamin D (CALCIUM + D PO) Take 1 tablet by mouth daily.     [provider]  cyanocobalamin (VITAMIN B12) 1000 MCG tablet Take 1 tablet (1,000 mcg total) by mouth daily. 03/29/23   Eustaquio Boyden, MD  fluticasone [facility]) 50 MCG/ACT nasal spray Place 2 sprays into both nostrils daily. 10/01/20   Rushie Chestnut, PA-C  hydrocortisone (ANUSOL-HC) 2.5 % rectal cream Place 1 Application rectally 2 (two) times daily. 03/25/23   Zenia Resides, MD  levothyroxine (SYNTHROID) 175 MCG tablet Take 1 tablet (175 mcg total) by mouth every morning. 04/07/23   Eustaquio Boyden, MD   lisinopril-hydrochlorothiazide (ZESTORETIC) 20-25 MG tablet Take 1 tablet by mouth daily. 03/29/23   Eustaquio Boyden, MD  metFORMIN (GLUCOPHAGE-XR) 500 MG 24 hr tablet Take 2 tablets by mouth daily with breakfast. 03/29/23   Eustaquio Boyden, MD  montelukast (SINGULAIR) 10 MG tablet Take 1 tablet (10 mg total) by mouth at bedtime. 03/29/23 03/28/24  Eustaquio Boyden, MD  pioglitazone (ACTOS) 15 MG tablet Take 1 tablet (15 mg total) by mouth daily. 04/07/23   Eustaquio Boyden, MD  vitamin E 180 MG (400 UNITS) capsule Take 400 Units  by mouth daily.    [provider]    Family History Family History  Problem Relation Age of Onset   Hypertension Father    Stroke Father        hemorrhagic, deceased   Diabetes Mother    Uterine cancer Mother    Coronary artery disease Neg Hx    Cancer Neg Hx     Social History Social History   Tobacco Use   Smoking status: Never   Smokeless tobacco: Never  Vaping Use   Vaping status: Never Used  Substance Use Topics   Alcohol use: No   Drug use: No     Allergies   Covid-19 (mrna) vaccine (pfizer) [covid-19 (mrna) vaccine], Meloxicam, Trulicity [dulaglutide], Amoxicillin, and Voltaren [diclofenac sodium]   Review of Systems Review of Systems  Constitutional:  Negative for fever.     Physical Exam Triage Vital Signs ED Triage Vitals  Encounter Vitals Group     BP 05/08/23 1940 (!) 160/98     Systolic BP Percentile --      Diastolic BP Percentile --      Pulse Rate 05/08/23 1940 72     Resp 05/08/23 1940 16     Temp 05/08/23 1940 98.1 F (36.7 C)     Temp Source 05/08/23 1940 Oral     SpO2 05/08/23 1940 96 %     Weight 05/08/23 1939 163 lb (73.9 kg)     Height 05/08/23 1939 5\' 6"  (1.676 m)     Head Circumference --      Peak Flow --      Pain Score 05/08/23 1939 8     Pain Loc --      Pain Education --      Exclude from Growth Chart --    No data found.  Updated Vital Signs BP (!) 160/98 (BP Location: Left Arm)    Pulse 72   Temp 98.1 F (36.7 C) (Oral)   Resp 16   Ht 5\' 6"  (1.676 m)   Wt 163 lb (73.9 kg)   LMP 11/12/2011   SpO2 96%   BMI 26.31 kg/m   Visual Acuity Right Eye Distance:   Left Eye Distance:   Bilateral Distance:    Right Eye Near:   Left Eye Near:    Bilateral Near:     Physical Exam Vitals and nursing note reviewed.  Constitutional:      Appearance: Normal appearance.  HENT:     Head: Normocephalic and atraumatic.     Right Ear: External ear normal.     Left Ear: External ear normal.     Nose: Nose normal.     Mouth/Throat:     Mouth: Mucous membranes are moist.  Eyes:     Conjunctiva/sclera: Conjunctivae normal.  Cardiovascular:     Rate and Rhythm: Normal rate.     Pulses: Normal pulses.          Dorsalis pedis pulses are 2+ on the right side.       Posterior tibial pulses are 2+ on the right side.  Pulmonary:     Effort: Pulmonary effort is normal. No respiratory distress.  Musculoskeletal:        General: Tenderness present. No swelling, deformity or signs of injury. Normal range of motion.       Feet:  Feet:     Right foot:     Skin integrity: Skin integrity normal.     Comments: Pain at the pad of  her right heel. Atraumatic.  Skin:    General: Skin is warm and dry.  Neurological:     General: No focal deficit present.     Mental Status: She is alert.  Psychiatric:        Mood and Affect: Mood normal.        Behavior: Behavior normal. Behavior is cooperative.      UC Treatments / Results  Labs (all labs ordered are listed, but only abnormal results are displayed) Labs Reviewed - No data to display  EKG   Radiology No results found.  Procedures Procedures (including critical care time)  Medications Ordered in UC Medications  ketorolac (TORADOL) 30 MG/ML injection 30 mg (has no administration in time range)    Initial Impression / Assessment and Plan / UC Course  I have reviewed the triage vital signs and the nursing  notes.  Pertinent labs & imaging results that were available during my care of the patient were reviewed by me and considered in my medical decision making (see chart for details).  Vitals and triage reviewed, patient is hemodynamically stable.  Pain in right heel similar to pain experienced in left with plantar fasciitis.  Imaging not indicated, atraumatic, ongoing and no injury.  Advised symptomatic management with proper support, rolling out foot on water bottle, will trial one-time dose of IM Toradol in clinic.  Patient has an orthopedic, advised her to follow-up. Patient verbalized understanding, no questions at this time.    Final Clinical Impressions(s) / UC Diagnoses   Final diagnoses:  Pain of right heel     Discharge Instructions      Creo que tiene fascitis plantar en el pie derecho, similar a la del izquierdo. Descanse, eleve la pierna, haga rodar el pie sobre una botella de agua congelada y tome Tylenol segn sea necesario para Chief Technology Officer. Le hemos dado una dosis nica de antiinflamatorios en la clnica. Realice un seguimiento con su ortopedista para una evaluacin e intervencin adicionales.  Vuelva a la clnica si presenta sntomas nuevos o urgentes.  I believe you have plantar fasciitis in your right foot, similar to your left.  Please rest, elevate, roll out your foot on a frozen water bottle, and take Tylenol as needed for pain.  We have given you a one-time dose in clinic of anti-inflammatories.  Please follow-up with your orthopedics for further evaluation and intervention.  Return to clinic for any new or urgent symptoms.     ED Prescriptions   None    PDMP not reviewed this encounter.   Lisette Mancebo, [place], [place] 05/08/23 2001

## 2023-05-08 NOTE — Discharge Instructions (Addendum)
Creo que tiene fascitis plantar en el pie derecho, similar a la del izquierdo. Descanse, eleve la pierna, haga rodar el pie sobre una botella de agua congelada y tome Tylenol segn sea necesario para Chief Technology Officer. Le hemos dado una dosis nica de antiinflamatorios en la clnica. Realice un seguimiento con su ortopedista para una evaluacin e intervencin adicionales.  Vuelva a la clnica si presenta sntomas nuevos o urgentes.  I believe you have plantar fasciitis in your right foot, similar to your left.  Please rest, elevate, roll out your foot on a frozen water bottle, and take Tylenol as needed for pain.  We have given you a one-time dose in clinic of anti-inflammatories.  Please follow-up with your orthopedics for further evaluation and intervention.  Return to clinic for any new or urgent symptoms.

## 2023-05-08 NOTE — ED Triage Notes (Signed)
Patient here today with c/o bilat heel pain X 2-3 months. She feels stinging in the bottom of her heels. She takes Tylenol with no relief.

## 2023-07-10 ENCOUNTER — Telehealth: Payer: Self-pay | Admitting: Family Medicine

## 2023-07-10 ENCOUNTER — Encounter: Payer: Self-pay | Admitting: Family Medicine

## 2023-07-10 ENCOUNTER — Ambulatory Visit (INDEPENDENT_AMBULATORY_CARE_PROVIDER_SITE_OTHER): Payer: BLUE CROSS/BLUE SHIELD | Admitting: Family Medicine

## 2023-07-10 ENCOUNTER — Other Ambulatory Visit (HOSPITAL_COMMUNITY): Payer: Self-pay

## 2023-07-10 VITALS — BP 122/82 | HR 80 | Temp 97.6°F | Ht 66.0 in | Wt 157.4 lb

## 2023-07-10 DIAGNOSIS — E785 Hyperlipidemia, unspecified: Secondary | ICD-10-CM

## 2023-07-10 DIAGNOSIS — E039 Hypothyroidism, unspecified: Secondary | ICD-10-CM | POA: Diagnosis not present

## 2023-07-10 DIAGNOSIS — E1169 Type 2 diabetes mellitus with other specified complication: Secondary | ICD-10-CM

## 2023-07-10 DIAGNOSIS — Z7984 Long term (current) use of oral hypoglycemic drugs: Secondary | ICD-10-CM

## 2023-07-10 LAB — COMPREHENSIVE METABOLIC PANEL
ALT: 27 U/L (ref 0–35)
AST: 16 U/L (ref 0–37)
Albumin: 4.1 g/dL (ref 3.5–5.2)
Alkaline Phosphatase: 111 U/L (ref 39–117)
BUN: 22 mg/dL (ref 6–23)
CO2: 30 meq/L (ref 19–32)
Calcium: 9.7 mg/dL (ref 8.4–10.5)
Chloride: 101 meq/L (ref 96–112)
Creatinine, Ser: 0.81 mg/dL (ref 0.40–1.20)
GFR: 83.31 mL/min (ref >60.00)
Glucose, Bld: 161 mg/dL — ABNORMAL HIGH (ref 70–99)
Potassium: 4.2 meq/L (ref 3.5–5.1)
Sodium: 139 meq/L (ref 135–145)
Total Bilirubin: 0.5 mg/dL (ref 0.2–1.2)
Total Protein: 7.2 g/dL (ref 6.0–8.3)

## 2023-07-10 LAB — POCT GLYCOSYLATED HEMOGLOBIN (HGB A1C): Hemoglobin A1C: 8.1 % — AB (ref 4.0–5.6)

## 2023-07-10 LAB — LIPID PANEL
Cholesterol: 163 mg/dL (ref 0–200)
HDL: 36.2 mg/dL — ABNORMAL LOW (ref >39.00)
LDL Cholesterol: 102 mg/dL — ABNORMAL HIGH (ref 0–99)
NonHDL: 126.37
Total CHOL/HDL Ratio: 4
Triglycerides: 123 mg/dL (ref 0.0–149.0)
VLDL: 24.6 mg/dL (ref 0.0–40.0)

## 2023-07-10 LAB — TSH: TSH: 0.01 u[IU]/mL — ABNORMAL LOW (ref 0.35–5.50)

## 2023-07-10 MED ORDER — PIOGLITAZONE HCL 15 MG PO TABS
15.0000 mg | ORAL_TABLET | Freq: Every day | ORAL | 2 refills | Status: DC
  Filled 2023-07-10: qty 90, 90d supply, fill #0
  Filled 2023-11-17 – 2023-11-28 (×2): qty 90, 90d supply, fill #1
  Filled 2024-04-01: qty 90, 90d supply, fill #2

## 2023-07-10 NOTE — Telephone Encounter (Signed)
I called [facility] regarding the Ophthalmology Referral: [name] at the office said pt is scheduled for 12/31/22 @ 9:30 am. She was scheduled 06/20/23 but did not show.  I called the patient with the assistance of an interpreter- left a message with the appointment information on her voicemail.

## 2023-07-10 NOTE — Telephone Encounter (Signed)
Noted thanks. Scheduled for 12/31/2023.

## 2023-07-10 NOTE — Progress Notes (Signed)
Ph: [phone] Fax: [phone]   Patient ID: Randell Patient Reita Chard, female    DOB: 04-13-1971, 52 y.o.   MRN: [id number]  This visit was conducted in person.  BP 122/82   Pulse 80   Temp 97.6 F (36.4 C) (Oral)   Ht 5\' 6"  (1.676 m)   Wt 157 lb 6 oz (71.4 kg)   LMP 11/12/2011   SpO2 95%   BMI 25.40 kg/m    CC: 3 mo f/u visit  Subjective:   HPI: Shalona Harbour is a 52 y.o. female presenting on 07/10/2023 for Medical Management of Chronic Issues (Here for 3 mo DM, chol and thyroid f/u.)   DM - does regularly check sugars fasting 130s. Watching portion sizes. Compliant with antihyperglycemic regimen which includes: metformin XR 1000mg  daily, actos 15mg  daily. GLP1RA caused pancreatitis. Denies low sugars or hypoglycemic symptoms. Denies paresthesias, blurry vision. Last diabetic eye exam DUE. Glucometer brand: unsure. Last foot exam: DUE. DSME: discussed.  Lab Results  Component Value Date   HGBA1C 8.1 (A) 07/10/2023   Diabetic Foot Exam - Simple   Simple Foot Form Diabetic Foot exam was performed with the following findings: Yes 07/10/2023  8:31 AM  Visual Inspection No deformities, no ulcerations, no other skin breakdown bilaterally: Yes Sensation Testing Intact to touch and monofilament testing bilaterally: Yes Pulse Check Posterior Tibialis and Dorsalis pulse intact bilaterally: Yes Comments No claudication    Lab Results  Component Value Date   MICROALBUR 1.7 03/29/2023    HLD - on atorvastatin 80mg  daily Hypothyroidism - on levothyroxine daily.  Due for rpt labs to monitor above.   Ongoing foot pain at heels. She had surgery 08/2022.  She has been drinking collagen drink with benefit.  Using arnica cream to feet with benefit     Relevant past medical, surgical, family and social history reviewed and updated as indicated. Interim medical history since our last visit reviewed. Allergies and medications reviewed and  updated. Outpatient Medications Prior to Visit  Medication Sig Dispense Refill   acetaminophen (TYLENOL) 500 MG tablet Take 1,000 mg by mouth as needed for moderate pain.      albuterol (VENTOLIN HFA) 108 (90 Base) MCG/ACT inhaler INHALE 2 PUFFS INTO THE LUNGS EVERY 6 HOURS AS NEEDED FOR WHEEZING OR SHORTNESS OF BREATH 6.7 g 3   aspirin 81 MG tablet Take 1 tablet (81 mg total) by mouth daily.     atorvastatin (LIPITOR) 80 MG tablet Take 1 tablet (80 mg total) by mouth daily. 90 tablet 3   beclomethasone (QVAR REDIHALER) 80 MCG/ACT inhaler Inhale 1 puff into the lungs 2 (two) times daily. 10.6 g 11   Blood Glucose Monitoring Suppl (BLOOD GLUCOSE MONITOR SYSTEM) w/Device KIT Use to test blood sugar in the morning, at noon, and at bedtime. 1 kit 0   Calcium Citrate-Vitamin D (CALCIUM + D PO) Take 1 tablet by mouth daily.      cyanocobalamin (VITAMIN B12) 1000 MCG tablet Take 1 tablet (1,000 mcg total) by mouth daily.     fluticasone (FLONASE) 50 MCG/ACT nasal spray Place 2 sprays into both nostrils daily. 16 mL 0   hydrocortisone (ANUSOL-HC) 2.5 % rectal cream Place 1 Application rectally 2 (two) times daily. 30 g 0   levothyroxine (SYNTHROID) 175 MCG tablet Take 1 tablet (175 mcg total) by mouth every morning. 90 tablet 4   lisinopril-hydrochlorothiazide (ZESTORETIC) 20-25 MG tablet Take 1 tablet by mouth daily. 90 tablet 4   metFORMIN (  GLUCOPHAGE-XR) 500 MG 24 hr tablet Take 2 tablets by mouth daily with breakfast. 180 tablet 4   montelukast (SINGULAIR) 10 MG tablet Take 1 tablet (10 mg total) by mouth at bedtime. 90 tablet 4   vitamin E 180 MG (400 UNITS) capsule Take 400 Units by mouth daily.     pioglitazone (ACTOS) 15 MG tablet Take 1 tablet (15 mg total) by mouth daily. 30 tablet 6   No facility-administered medications prior to visit.     Per HPI unless specifically indicated in ROS section below Review of Systems  Objective:  BP 122/82   Pulse 80   Temp 97.6 F (36.4 C) (Oral)    Ht 5\' 6"  (1.676 m)   Wt 157 lb 6 oz (71.4 kg)   LMP 11/12/2011   SpO2 95%   BMI 25.40 kg/m   Wt Readings from Last 3 Encounters:  07/10/23 157 lb 6 oz (71.4 kg)  05/08/23 163 lb (73.9 kg)  03/29/23 163 lb 2 oz (74 kg)      Physical Exam Vitals and nursing note reviewed.  Constitutional:      Appearance: Normal appearance. She is not ill-appearing.  HENT:     Head: Normocephalic and atraumatic.     Mouth/Throat:     Mouth: Mucous membranes are moist.     Pharynx: Oropharynx is clear. No oropharyngeal exudate or posterior oropharyngeal erythema.  Eyes:     Extraocular Movements: Extraocular movements intact.     Conjunctiva/sclera: Conjunctivae normal.     Pupils: Pupils are equal, round, and reactive to light.  Cardiovascular:     Rate and Rhythm: Normal rate and regular rhythm.     Pulses: Normal pulses.     Heart sounds: Normal heart sounds. No murmur heard. Pulmonary:     Effort: Pulmonary effort is normal. No respiratory distress.     Breath sounds: Normal breath sounds. No wheezing, rhonchi or rales.  Musculoskeletal:     Right lower leg: No edema.     Left lower leg: No edema.     Comments: See HPI for foot exam if done  Skin:    General: Skin is warm and dry.     Findings: No rash.  Neurological:     Mental Status: She is alert.  Psychiatric:        Mood and Affect: Mood normal.        Behavior: Behavior normal.       Results for orders placed or performed in visit on 07/10/23  POCT glycosylated hemoglobin (Hb A1C)  Result Value Ref Range   Hemoglobin A1C 8.1 (A) 4.0 - 5.6 %   HbA1c POC (<> result, manual entry)     HbA1c, POC (prediabetic range)     HbA1c, POC (controlled diabetic range)     Lab Results  Component Value Date   CHOL 273 (H) 03/29/2023   HDL 35.90 (L) 03/29/2023   LDLCALC 155 (H) 11/12/2013   LDLDIRECT 181.0 03/29/2023   TRIG 236.0 (H) 03/29/2023   CHOLHDL 8 03/29/2023    Lab Results  Component Value Date   TSH 10.84 (H)  03/29/2023    Assessment & Plan:   Problem List Items Addressed This Visit     Hypothyroidism    Update TSH on higher levothyroxine dose       Relevant Orders   TSH   Dyslipidemia associated with type 2 diabetes mellitus (HCC)    Update FPL on higher atorvastatin dose which she is tolerating well.  The ASCVD Risk score (Arnett DK, et al., 2019) failed to calculate for the following reasons:   The patient has a prior MI or stroke diagnosis       Relevant Medications   pioglitazone (ACTOS) 15 MG tablet   Other Relevant Orders   Lipid panel   Comprehensive metabolic panel   Ambulatory referral to diabetic education   Type 2 diabetes mellitus with other specified complication (HCC) - Primary    Chronic, improving but remaining uncontrolled. She ran out of actos - states there were no refills available.  Continue metformin XR, restart actos 15mg  daily.  Refer to diabetic education classes.  I will ask referral team to help in scheduling diabetic eye exam (referral placed 03/2023)      Relevant Medications   pioglitazone (ACTOS) 15 MG tablet   Other Relevant Orders   POCT glycosylated hemoglobin (Hb A1C) (Completed)   Ambulatory referral to diabetic education     Meds ordered this encounter  Medications   pioglitazone (ACTOS) 15 MG tablet    Sig: Take 1 tablet (15 mg total) by mouth daily.    Dispense:  90 tablet    Refill:  2    Orders Placed This Encounter  Procedures   Lipid panel   Comprehensive metabolic panel   TSH   Ambulatory referral to diabetic education    Referral Priority:   Routine    Referral Type:   Consultation    Referral Reason:   Specialty Services Required    Number of Visits Requested:   1   POCT glycosylated hemoglobin (Hb A1C)    Patient Instructions  La remitiremos a nutricionista  Gusto verla hoy Laboratorios hoy Siga medicinas como hasta ahora  Regresar en 4 meses para seguimiento de diabetes  Follow up plan: Return in about 4  months (around 11/07/2023) for follow up visit.  Eustaquio Boyden, MD

## 2023-07-10 NOTE — Assessment & Plan Note (Addendum)
Chronic, improving but remaining uncontrolled. She ran out of actos - states there were no refills available.  Continue metformin XR, restart actos 15mg  daily.  Refer to diabetic education classes.  I will ask referral team to help in scheduling diabetic eye exam (referral placed 03/2023)

## 2023-07-10 NOTE — Assessment & Plan Note (Signed)
Update FPL on higher atorvastatin dose which she is tolerating well.  The ASCVD Risk score (Arnett DK, et al., 2019) failed to calculate for the following reasons:   The patient has a prior MI or stroke diagnosis

## 2023-07-10 NOTE — Patient Instructions (Signed)
La remitiremos a nutricionista  Gusto verla hoy Laboratorios hoy Siga medicinas como hasta ahora  Regresar en 4 meses para seguimiento de diabetes

## 2023-07-10 NOTE — Assessment & Plan Note (Signed)
Update TSH on higher levothyroxine dose.

## 2023-07-13 ENCOUNTER — Other Ambulatory Visit (HOSPITAL_COMMUNITY): Payer: Self-pay

## 2023-07-13 ENCOUNTER — Other Ambulatory Visit: Payer: Self-pay | Admitting: Family Medicine

## 2023-07-13 DIAGNOSIS — E039 Hypothyroidism, unspecified: Secondary | ICD-10-CM

## 2023-07-13 MED ORDER — LEVOTHYROXINE SODIUM 150 MCG PO TABS
150.0000 ug | ORAL_TABLET | Freq: Every morning | ORAL | 11 refills | Status: DC
  Filled 2023-07-13: qty 30, 30d supply, fill #0
  Filled 2023-11-17 – 2023-11-28 (×2): qty 30, 30d supply, fill #1
  Filled 2024-01-04: qty 30, 30d supply, fill #2
  Filled 2024-02-16: qty 30, 30d supply, fill #3
  Filled 2024-04-01: qty 30, 30d supply, fill #4
  Filled 2024-05-11: qty 30, 30d supply, fill #5
  Filled 2024-06-16: qty 30, 30d supply, fill #6

## 2023-07-20 ENCOUNTER — Other Ambulatory Visit (HOSPITAL_COMMUNITY): Payer: Self-pay

## 2023-07-30 ENCOUNTER — Other Ambulatory Visit (HOSPITAL_COMMUNITY): Payer: Self-pay

## 2023-08-04 ENCOUNTER — Encounter (HOSPITAL_COMMUNITY): Payer: Self-pay | Admitting: Emergency Medicine

## 2023-08-04 ENCOUNTER — Ambulatory Visit (HOSPITAL_COMMUNITY)
Admission: EM | Admit: 2023-08-04 | Discharge: 2023-08-04 | Disposition: A | Discharge status: Home / Self Care | Payer: BLUE CROSS/BLUE SHIELD | Attending: Emergency Medicine | Admitting: Emergency Medicine

## 2023-08-04 DIAGNOSIS — K644 Residual hemorrhoidal skin tags: Secondary | ICD-10-CM

## 2023-08-04 MED ORDER — HYDROCORTISONE ACETATE 25 MG RE SUPP: 25.0000 mg | Freq: Two times a day (BID) | RECTAL | 0 refills | Status: DC

## 2023-08-04 MED ORDER — HYDROCORTISONE (PERIANAL) 2.5 % EX CREA: 1.0000 | TOPICAL_CREAM | Freq: Two times a day (BID) | CUTANEOUS | 0 refills | Status: DC

## 2023-08-04 MED ORDER — DOCUSATE SODIUM 100 MG PO CAPS: 100.0000 mg | ORAL_CAPSULE | Freq: Two times a day (BID) | ORAL | 0 refills | Status: DC

## 2023-08-04 NOTE — Discharge Instructions (Addendum)
Tienes una hemorroide externa. Puedes aplicarte la crema rectal hasta 2 veces al da. Tambin puedes darte baos tibios con sal de Epsom para Engineer, materials y la hinchazn. Comienza a Librarian, academic de heces y asegrate de beber mucho lquido. Tambin puedes considerar tomar una gomita de [place] de venta libre para ayudar a ablandar las heces. Consulta con tu mdico de cabecera si esto persiste. Busca atencin inmediata en el servicio de urgencias ms cercano si tienes sncope, fiebre, dolor abdominal intenso o cualquier cambio.  You have an external hemorrhoid.  You can do the rectal cream up to 2 times daily.  You can also do warm baths with epsom salt to help with pain and swelling. Start taking the stool softener and ensure you are drinking plenty of fluids.  You can also consider an over-the-counter fiber gummy to help soften your stools.  Please follow-up with your primary care provider if this persist.  Seek immediate care at the nearest emergency department for any syncope, fever, severe abdominal pain, or any changes.

## 2023-08-04 NOTE — ED Provider Notes (Signed)
[facility]    CSN: [id number] Arrival date & time: 08/04/23  1751      History   Chief Complaint Chief Complaint  Patient presents with   Rectal Pain    HPI [name] is a 52 y.o. female.   Patient presents to clinic for rectal pain that started Friday after a bowel movement.  She is also having left lower quadrant abdominal pain.  Has not had any fever or emesis.  No vomiting or diarrhea. No blood in stools.  Reports she was straining to have a bowel movement and does have a history of hemorrhoids and constipation.  She has not tried any interventions for her symptoms.  The history is provided by the patient and medical records.    Past Medical History:  Diagnosis Date   Bilateral ovarian cysts    Chronic pelvic pain in female    Complication of anesthesia    Hemorrhoids    Hepatic steatosis 07/2006, 04/2014   on Abd CT done for pain   History of anal fissures    History of CVA (cerebrovascular accident) without residual deficits followed by pcp--- takes asa 81mg    04/ 2008--- due to cerebrovascular occlusion,  left basal ganglia subacute infarct;   per pt no residuals   History of ectopic pregnancy    2007--  treated with medication   History of hyperthyroidism    s/p  RAI   03-25-2001   Hydrosalpinx    Hypertension    followed by pcp   Hypothyroidism, postradioiodine therapy    followed by pcp---  multinodular goiter;   s/p  RAI 03-25-2001 for hyperthyroidism   PCOS (polycystic ovarian syndrome)    Pelvic adhesions    PONV (postoperative nausea and vomiting)    Seasonal asthma    followed by pcp--- no inhaler, takes singulair   Type II diabetes mellitus (HCC) dx'd 12/2015   followed by pcp ---   (11-06-2019  per pt does not check blood sugar at home)    Patient Active Problem List   Diagnosis Date Noted   Thrombocytopenia (HCC) 11/24/2022   History of acute pancreatitis 08/06/2022   Plantar fasciitis of left foot 01/22/2022    Breast mass 05/31/2021   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   Mass of occipital region 01/11/2017   Health maintenance examination 01/11/2017   Type 2 diabetes mellitus with other specified complication (HCC) 10/12/2016   Endometriosis    Chronic female pelvic pain 05/07/2012   Migraine headache 05/12/2007   History of ischemic stroke 12/12/2006   Hypothyroidism 10/31/2006   Dyslipidemia associated with type 2 diabetes mellitus (HCC) 10/31/2006   Body mass index (BMI) 25.0-25.9, adult 10/31/2006   HYPERTENSION, BENIGN SYSTEMIC 10/31/2006   Asthma 10/31/2006    Past Surgical History:  Procedure Laterality Date   ABDOMINAL HYSTERECTOMY  04/24/2012   Procedure: HYSTERECTOMY ABDOMINAL;  Surgeon: Ok Edwards, MD;  Location: [facility];  Service: Gynecology;;   ANAL SPHINCTEROTOMY     APPENDECTOMY  1997   ruptured   CERVICAL CERCLAGE  11-06-2008;  10-07-2010  @[facility]    CESAREAN SECTION W/BTL Bilateral 12/2010   bilateral tubal ligation with filshie clips   COLONOSCOPY  05/2019   anal fissure, polyps (TA, HP), rpt 7 yrs ([facility] @ [facility])   ESOPHAGOGASTRODUODENOSCOPY  10/2014   WNL   EXCISION OF ENDOMETRIOMA N/A 08/20/2016   Procedure: EXCISION OF ENDOMETRIOMA LEFT LOWER QUADRANT;  Surgeon: Violeta Gelinas, MD;  Location: [facility];  Service:  General;  Laterality: N/A;   EXPLORATORY LAPAROTOMY  03/2004   WITH LYSIS ADHESIONS   FLEXIBLE SIGMOIDOSCOPY  10/2018   s/p botox injection for anal fissure ([facility] @ [facility])   LAPAROSCOPIC CHOLECYSTECTOMY  09/1995   LAPAROSCOPIC HYSTERECTOMY  04/24/2012   Procedure: HYSTERECTOMY TOTAL LAPAROSCOPIC;  Surgeon: Ok Edwards, MD;  Location: [facility];  Service: Gynecology;  Laterality: N/A;  2 1/2 hours OR time.  Dr. Audie Box to assisting    LAPAROSCOPIC LYSIS INTESTINAL ADHESIONS     LAPAROSCOPIC SALPINGO OOPHERECTOMY Bilateral 11/11/2019   diagnostic laparoscopy unsuccessful - extensive adhesions, unable to visualize adnexa  (Anyanwu, Jethro Bastos, MD)    OB History     Gravida  5   Para  1   Term  0   Preterm  1   AB  3   Living         SAB  3   IAB  0   Ectopic  0   Multiple      Live Births               Home Medications    Prior to Admission medications   Medication Sig Start Date End Date Taking? Authorizing Provider  docusate sodium (COLACE) 100 MG capsule Take 1 capsule (100 mg total) by mouth every 12 (twelve) hours. 08/04/23  Yes Rinaldo Ratel, [place] N, FNP  hydrocortisone (ANUSOL-HC) 2.5 % rectal cream Place 1 Application rectally 2 (two) times daily. 08/04/23  Yes Rinaldo Ratel, [place], FNP  acetaminophen (TYLENOL) 500 MG tablet Take 1,000 mg by mouth as needed for moderate pain.     [provider]  albuterol (VENTOLIN HFA) 108 (90 Base) MCG/ACT inhaler INHALE 2 PUFFS INTO THE LUNGS EVERY 6 HOURS AS NEEDED FOR WHEEZING OR SHORTNESS OF BREATH 03/29/23 03/28/24  Eustaquio Boyden, MD  aspirin 81 MG tablet Take 1 tablet (81 mg total) by mouth daily. 01/11/17   Eustaquio Boyden, MD  atorvastatin (LIPITOR) 80 MG tablet Take 1 tablet (80 mg total) by mouth daily. 04/07/23   Eustaquio Boyden, MD  beclomethasone (QVAR REDIHALER) 80 MCG/ACT inhaler Inhale 1 puff into the lungs 2 (two) times daily. 03/29/23   Eustaquio Boyden, MD  Blood Glucose Monitoring Suppl (BLOOD GLUCOSE MONITOR SYSTEM) w/Device KIT Use to test blood sugar in the morning, at noon, and at bedtime. 03/29/23   Eustaquio Boyden, MD  Calcium Citrate-Vitamin D (CALCIUM + D PO) Take 1 tablet by mouth daily.     [provider]  cyanocobalamin (VITAMIN B12) 1000 MCG tablet Take 1 tablet (1,000 mcg total) by mouth daily. 03/29/23   Eustaquio Boyden, MD  fluticasone [facility]) 50 MCG/ACT nasal spray Place 2 sprays into both nostrils daily. 10/01/20   Rushie Chestnut, PA-C  levothyroxine (SYNTHROID) 150 MCG tablet Take 1 tablet (150 mcg total) by mouth every morning. 07/13/23   Eustaquio Boyden, MD   lisinopril-hydrochlorothiazide (ZESTORETIC) 20-25 MG tablet Take 1 tablet by mouth daily. 03/29/23   Eustaquio Boyden, MD  metFORMIN (GLUCOPHAGE-XR) 500 MG 24 hr tablet Take 2 tablets by mouth daily with breakfast. 03/29/23   Eustaquio Boyden, MD  montelukast (SINGULAIR) 10 MG tablet Take 1 tablet (10 mg total) by mouth at bedtime. 03/29/23 03/28/24  Eustaquio Boyden, MD  pioglitazone (ACTOS) 15 MG tablet Take 1 tablet (15 mg total) by mouth daily. 07/10/23   Eustaquio Boyden, MD  vitamin E 180 MG (400 UNITS) capsule Take 400 Units by mouth daily.    [provider]  Family History Family History  Problem Relation Age of Onset   Hypertension Father    Stroke Father        hemorrhagic, deceased   Diabetes Mother    Uterine cancer Mother    Coronary artery disease Neg Hx    Cancer Neg Hx     Social History Social History   Tobacco Use   Smoking status: Never   Smokeless tobacco: Never  Vaping Use   Vaping status: Never Used  Substance Use Topics   Alcohol use: No   Drug use: No     Allergies   Covid-19 (mrna) vaccine (pfizer) [covid-19 (mrna) vaccine], Meloxicam, Trulicity [dulaglutide], Amoxicillin, and Voltaren [diclofenac sodium]   Review of Systems Review of Systems  Per HPI   Physical Exam Triage Vital Signs ED Triage Vitals  Encounter Vitals Group     BP 08/04/23 1850 (!) 141/87     Systolic BP Percentile --      Diastolic BP Percentile --      Pulse Rate 08/04/23 1850 90     Resp 08/04/23 1850 17     Temp 08/04/23 1850 97.6 F (36.4 C)     Temp Source 08/04/23 1850 Oral     SpO2 08/04/23 1850 95 %     Weight --      Height --      Head Circumference --      Peak Flow --      Pain Score 08/04/23 1853 10     Pain Loc --      Pain Education --      Exclude from Growth Chart --    No data found.  Updated Vital Signs BP (!) 141/87 (BP Location: Left Arm)   Pulse 90   Temp 97.6 F (36.4 C) (Oral)   Resp 17   LMP 11/12/2011   SpO2  95%   Visual Acuity Right Eye Distance:   Left Eye Distance:   Bilateral Distance:    Right Eye Near:   Left Eye Near:    Bilateral Near:     Physical Exam Vitals and nursing note reviewed. Exam conducted with a chaperone present.  Constitutional:      Appearance: Normal appearance.  HENT:     Head: Normocephalic and atraumatic.     Right Ear: External ear normal.     Left Ear: External ear normal.     Nose: Nose normal.     Mouth/Throat:     Mouth: Mucous membranes are moist.  Eyes:     Conjunctiva/sclera: Conjunctivae normal.  Cardiovascular:     Rate and Rhythm: Normal rate.  Pulmonary:     Effort: Pulmonary effort is normal. No respiratory distress.  Abdominal:     General: Abdomen is flat. Bowel sounds are normal.     Palpations: Abdomen is soft.     Tenderness: There is abdominal tenderness in the left lower quadrant.     Comments: Mild left lower quadrant tenderness without rebound or guarding.  Genitourinary:    Rectum: External hemorrhoid present.       Comments: External hemorrhoid visible on rectal exam.  Tenderness to palpation.  Does not appear to be thrombosed. Musculoskeletal:        General: Normal range of motion.     Cervical back: Normal range of motion.  Skin:    General: Skin is warm and dry.  Neurological:     Mental Status: She is alert.  Psychiatric:  Behavior: Behavior is cooperative.      UC Treatments / Results  Labs (all labs ordered are listed, but only abnormal results are displayed) Labs Reviewed - No data to display  EKG   Radiology No results found.  Procedures Procedures (including critical care time)  Medications Ordered in UC Medications - No data to display  Initial Impression / Assessment and Plan / UC Course  I have reviewed the triage vital signs and the nursing notes.  Pertinent labs & imaging results that were available during my care of the patient were reviewed by me and considered in my medical  decision making (see chart for details).  Vitals and triage reviewed, patient is hemodynamically stable.  Chaperone present with physical exam which reveals an external hemorrhoid that is tender to palpation.  Abdomen is soft with active bowel sounds, left lower quadrant tenderness without rebound or guarding, low suspicion for surgical abdomen.  History of constipation and hemorrhoids, will trial rectal cream and stool softener.  Symptomatic management discussed.  Plan of care, follow-up care and emergency precautions given, no questions at this time.     Final Clinical Impressions(s) / UC Diagnoses   Final diagnoses:  External hemorrhoid     Discharge Instructions      Tienes una hemorroide externa. Puedes aplicarte la crema rectal hasta 2 veces al da. Tambin puedes darte baos tibios con sal de Epsom para Engineer, materials y la hinchazn. Comienza a Librarian, academic de heces y asegrate de beber mucho lquido. Tambin puedes considerar tomar una gomita de [place] de venta libre para ayudar a ablandar las heces. Consulta con tu mdico de cabecera si esto persiste. Busca atencin inmediata en el servicio de urgencias ms cercano si tienes sncope, fiebre, dolor abdominal intenso o cualquier cambio.  You have an external hemorrhoid.  You can do the rectal cream up to 2 times daily.  You can also do warm baths with epsom salt to help with pain and swelling. Start taking the stool softener and ensure you are drinking plenty of fluids.  You can also consider an over-the-counter fiber gummy to help soften your stools.  Please follow-up with your primary care provider if this persist.  Seek immediate care at the nearest emergency department for any syncope, fever, severe abdominal pain, or any changes.     ED Prescriptions     Medication Sig Dispense Auth. Provider   docusate sodium (COLACE) 100 MG capsule Take 1 capsule (100 mg total) by mouth every 12 (twelve) hours. 60 capsule Rinaldo Ratel,  [place], [place]   hydrocortisone (ANUSOL-HC) 25 MG suppository  (Status: Discontinued) Place 1 suppository (25 mg total) rectally 2 (two) times daily. 12 suppository Rinaldo Ratel, [place] N, [place]   hydrocortisone (ANUSOL-HC) 2.5 % rectal cream Place 1 Application rectally 2 (two) times daily. 30 g Ifeoma Vallin, [place] N, [place]      PDMP not reviewed this encounter.   Spyros Winch, [place] N, [place] 08/04/23 [phone]

## 2023-08-04 NOTE — ED Triage Notes (Addendum)
Pt c/o abdominal pain and a lot of rectal pain with bowel movement that started Friday

## 2023-08-22 ENCOUNTER — Other Ambulatory Visit: Payer: Self-pay | Admitting: Family Medicine

## 2023-08-22 DIAGNOSIS — E039 Hypothyroidism, unspecified: Secondary | ICD-10-CM

## 2023-08-27 ENCOUNTER — Other Ambulatory Visit: Payer: BLUE CROSS/BLUE SHIELD

## 2023-09-05 ENCOUNTER — Ambulatory Visit: Payer: BLUE CROSS/BLUE SHIELD | Admitting: Skilled Nursing Facility1

## 2023-10-15 ENCOUNTER — Ambulatory Visit (HOSPITAL_COMMUNITY)
Admission: EM | Admit: 2023-10-15 | Discharge: 2023-10-15 | Disposition: A | Discharge status: Home / Self Care | Payer: BLUE CROSS/BLUE SHIELD | Attending: Physician Assistant | Admitting: Physician Assistant

## 2023-10-15 ENCOUNTER — Encounter (HOSPITAL_COMMUNITY): Payer: Self-pay | Admitting: *Deleted

## 2023-10-15 DIAGNOSIS — L600 Ingrowing nail: Secondary | ICD-10-CM | POA: Diagnosis not present

## 2023-10-15 MED ORDER — LIDOCAINE HCL 2 % IJ SOLN
5.0000 mL | Freq: Once | INTRAMUSCULAR | Status: AC
  Administered 2023-10-15: 100 mg via INTRADERMAL

## 2023-10-15 MED ORDER — SILVER SULFADIAZINE 1 % EX CREA: 1.0000 | TOPICAL_CREAM | Freq: Every day | CUTANEOUS | 0 refills | Status: DC

## 2023-10-15 MED ORDER — LIDOCAINE HCL (PF) 2 % IJ SOLN
INTRAMUSCULAR | Status: AC
Start: 2023-10-15 — End: ?
  Filled 2023-10-15: qty 5

## 2023-10-15 NOTE — Discharge Instructions (Addendum)
Your toe will likely be sore for the next few days.  You can soak it in warm water and Epsom salt.  Please make sure that you are keeping the area clean and dry.  You can use Tylenol as needed for pain management.  I have sent in a cream to help with antibiotic coverage.  This is called Silvadene and you should apply it twice per day as needed.  If you notice any swelling, redness, warmth to the area, drainage that looks like pus or bleeding please come back or go see your PCP for further follow-up.

## 2023-10-15 NOTE — ED Triage Notes (Signed)
Pt states she has left great toe pain X 5 days. She has been taking tylenol. She states its her toe nail.

## 2023-10-15 NOTE — ED Provider Notes (Signed)
[facility]    CSN: [id number] Arrival date & time: 10/15/23  1737      History   Chief Complaint Chief Complaint  Patient presents with   Foot Pain    HPI [name] is a 53 y.o. female.   HPI  She is here with a companion who is helping with translation She states she is concerned for an ingrown toenail on her left great toe She states she has had pain for the past 4-5 days She has been taking tylenol for pain   She reports feeling a pinching sensation along inner aspect of toenail  She denies bleeding or drainage   Past Medical History:  Diagnosis Date   Bilateral ovarian cysts    Chronic pelvic pain in female    Complication of anesthesia    Hemorrhoids    Hepatic steatosis 07/2006, 04/2014   on Abd CT done for pain   History of anal fissures    History of CVA (cerebrovascular accident) without residual deficits followed by pcp--- takes asa 81mg    04/ 2008--- due to cerebrovascular occlusion,  left basal ganglia subacute infarct;   per pt no residuals   History of ectopic pregnancy    2007--  treated with medication   History of hyperthyroidism    s/p  RAI   03-25-2001   Hydrosalpinx    Hypertension    followed by pcp   Hypothyroidism, postradioiodine therapy    followed by pcp---  multinodular goiter;   s/p  RAI 03-25-2001 for hyperthyroidism   PCOS (polycystic ovarian syndrome)    Pelvic adhesions    PONV (postoperative nausea and vomiting)    Seasonal asthma    followed by pcp--- no inhaler, takes singulair   Type II diabetes mellitus (HCC) dx'd 12/2015   followed by pcp ---   (11-06-2019  per pt does not check blood sugar at home)    Patient Active Problem List   Diagnosis Date Noted   Thrombocytopenia (HCC) 11/24/2022   History of acute pancreatitis 08/06/2022   Plantar fasciitis of left foot 01/22/2022   Breast mass 05/31/2021   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   Mass of occipital region 01/11/2017   Health  maintenance examination 01/11/2017   Type 2 diabetes mellitus with other specified complication (HCC) 10/12/2016   Endometriosis    Chronic female pelvic pain 05/07/2012   Migraine headache 05/12/2007   History of ischemic stroke 12/12/2006   Hypothyroidism 10/31/2006   Dyslipidemia associated with type 2 diabetes mellitus (HCC) 10/31/2006   Body mass index (BMI) 25.0-25.9, adult 10/31/2006   HYPERTENSION, BENIGN SYSTEMIC 10/31/2006   Asthma 10/31/2006    Past Surgical History:  Procedure Laterality Date   ABDOMINAL HYSTERECTOMY  04/24/2012   Procedure: HYSTERECTOMY ABDOMINAL;  Surgeon: Ok Edwards, MD;  Location: [facility];  Service: Gynecology;;   ANAL SPHINCTEROTOMY     APPENDECTOMY  1997   ruptured   CERVICAL CERCLAGE  11-06-2008;  10-07-2010  @[facility]    CESAREAN SECTION W/BTL Bilateral 12/2010   bilateral tubal ligation with filshie clips   COLONOSCOPY  05/2019   anal fissure, polyps (TA, HP), rpt 7 yrs ([facility] @ [facility])   ESOPHAGOGASTRODUODENOSCOPY  10/2014   WNL   EXCISION OF ENDOMETRIOMA N/A 08/20/2016   Procedure: EXCISION OF ENDOMETRIOMA LEFT LOWER QUADRANT;  Surgeon: Violeta Gelinas, MD;  Location: [facility];  Service: General;  Laterality: N/A;   EXPLORATORY LAPAROTOMY  03/2004   WITH LYSIS ADHESIONS   FLEXIBLE  SIGMOIDOSCOPY  10/2018   s/p botox injection for anal fissure ([facility] @ [facility])   LAPAROSCOPIC CHOLECYSTECTOMY  09/1995   LAPAROSCOPIC HYSTERECTOMY  04/24/2012   Procedure: HYSTERECTOMY TOTAL LAPAROSCOPIC;  Surgeon: Ok Edwards, MD;  Location: [facility];  Service: Gynecology;  Laterality: N/A;  2 1/2 hours OR time.  Dr. Audie Box to assisting    LAPAROSCOPIC LYSIS INTESTINAL ADHESIONS     LAPAROSCOPIC SALPINGO OOPHERECTOMY Bilateral 11/11/2019   diagnostic laparoscopy unsuccessful - extensive adhesions, unable to visualize adnexa (Anyanwu, Jethro Bastos, MD)    OB History     Gravida  5   Para  1   Term  0   Preterm  1   AB  3   Living          SAB  3   IAB  0   Ectopic  0   Multiple      Live Births               Home Medications    Prior to Admission medications   Medication Sig Start Date End Date Taking? Authorizing Provider  acetaminophen (TYLENOL) 500 MG tablet Take 1,000 mg by mouth as needed for moderate pain.    Yes [provider]  aspirin 81 MG tablet Take 1 tablet (81 mg total) by mouth daily. 01/11/17  Yes Eustaquio Boyden, MD  atorvastatin (LIPITOR) 80 MG tablet Take 1 tablet (80 mg total) by mouth daily. 04/07/23  Yes Eustaquio Boyden, MD  Blood Glucose Monitoring Suppl (BLOOD GLUCOSE MONITOR SYSTEM) w/Device KIT Use to test blood sugar in the morning, at noon, and at bedtime. 03/29/23  Yes Eustaquio Boyden, MD  Calcium Citrate-Vitamin D (CALCIUM + D PO) Take 1 tablet by mouth daily.    Yes [provider]  cyanocobalamin (VITAMIN B12) 1000 MCG tablet Take 1 tablet (1,000 mcg total) by mouth daily. 03/29/23  Yes Eustaquio Boyden, MD  docusate sodium (COLACE) 100 MG capsule Take 1 capsule (100 mg total) by mouth every 12 (twelve) hours. 08/04/23  Yes Rinaldo Ratel, [place] N, FNP  fluticasone [facility]) 50 MCG/ACT nasal spray Place 2 sprays into both nostrils daily. 10/01/20  Yes Covington, Sarah M, PA-C  hydrocortisone (ANUSOL-HC) 2.5 % rectal cream Place 1 Application rectally 2 (two) times daily. 08/04/23  Yes Rinaldo Ratel, [place] N, FNP  levothyroxine (SYNTHROID) 150 MCG tablet Take 1 tablet (150 mcg total) by mouth every morning. 07/13/23  Yes Eustaquio Boyden, MD  lisinopril-hydrochlorothiazide (ZESTORETIC) 20-25 MG tablet Take 1 tablet by mouth daily. 03/29/23  Yes Eustaquio Boyden, MD  metFORMIN (GLUCOPHAGE-XR) 500 MG 24 hr tablet Take 2 tablets by mouth daily with breakfast. 03/29/23  Yes Eustaquio Boyden, MD  montelukast (SINGULAIR) 10 MG tablet Take 1 tablet (10 mg total) by mouth at bedtime. 03/29/23 03/28/24 Yes Eustaquio Boyden, MD  pioglitazone (ACTOS) 15 MG tablet Take 1 tablet  (15 mg total) by mouth daily. 07/10/23  Yes Eustaquio Boyden, MD  silver sulfADIAZINE (SILVADENE) 1 % cream Apply 1 Application topically daily. 10/15/23  Yes Mckell Riecke E, PA-C  vitamin E 180 MG (400 UNITS) capsule Take 400 Units by mouth daily.   Yes [provider]  albuterol (VENTOLIN HFA) 108 (90 Base) MCG/ACT inhaler INHALE 2 PUFFS INTO THE LUNGS EVERY 6 HOURS AS NEEDED FOR WHEEZING OR SHORTNESS OF BREATH 03/29/23 03/28/24  Eustaquio Boyden, MD  beclomethasone (QVAR REDIHALER) 80 MCG/ACT inhaler Inhale 1 puff into the lungs 2 (two) times daily. 03/29/23   Eustaquio Boyden, MD  Family History Family History  Problem Relation Age of Onset   Hypertension Father    Stroke Father        hemorrhagic, deceased   Diabetes Mother    Uterine cancer Mother    Coronary artery disease Neg Hx    Cancer Neg Hx     Social History Social History   Tobacco Use   Smoking status: Never   Smokeless tobacco: Never  Vaping Use   Vaping status: Never Used  Substance Use Topics   Alcohol use: No   Drug use: No     Allergies   Covid-19 (mrna) vaccine (pfizer) [covid-19 (mrna) vaccine], Meloxicam, Trulicity [dulaglutide], Amoxicillin, and Voltaren [diclofenac sodium]   Review of Systems Review of Systems  Constitutional:  Negative for chills and fever.  Skin:        Left great toenail pain      Physical Exam Triage Vital Signs ED Triage Vitals  Encounter Vitals Group     BP 10/15/23 1849 (!) 167/99     Systolic BP Percentile --      Diastolic BP Percentile --      Pulse Rate 10/15/23 1849 66     Resp 10/15/23 1849 18     Temp 10/15/23 1849 97.8 F (36.6 C)     Temp Source 10/15/23 1849 Oral     SpO2 10/15/23 1849 96 %     Weight --      Height --      Head Circumference --      Peak Flow --      Pain Score 10/15/23 1847 10     Pain Loc --      Pain Education --      Exclude from Growth Chart --    No data found.  Updated Vital Signs BP (!) 167/99 (BP  Location: Right Arm)   Pulse 66   Temp 97.8 F (36.6 C) (Oral)   Resp 18   LMP 11/12/2011   SpO2 96%   Visual Acuity Right Eye Distance:   Left Eye Distance:   Bilateral Distance:    Right Eye Near:   Left Eye Near:    Bilateral Near:     Physical Exam Vitals reviewed.  Constitutional:      Appearance: Normal appearance.  HENT:     Head: Normocephalic and atraumatic.  Feet:     Left foot:     Toenail Condition: Left toenails are ingrown.     Comments: There is notable ingrown toenail on the left great toe.  There is also some mild purulent drainage and tenderness along the medial aspect. Skin:    General: Skin is warm and dry.  Neurological:     General: No focal deficit present.     Mental Status: She is alert and oriented to person, place, and time.  Psychiatric:        Mood and Affect: Mood normal.        Behavior: Behavior normal.        Thought Content: Thought content normal.        Judgment: Judgment normal.      UC Treatments / Results  Labs (all labs ordered are listed, but only abnormal results are displayed) Labs Reviewed - No data to display  EKG   Radiology No results found.  Procedures Excise Ingrown Toenail  Date/Time: 10/15/2023 8:33 PM  Performed by: Providence Crosby, PA-C Authorized by: Providence Crosby, PA-C   Consent:  Consent obtained:  Verbal   Consent given by:  Patient   Risks discussed:  Bleeding, incomplete removal, infection and pain   Alternatives discussed:  No treatment, delayed treatment and referral Universal protocol:    Procedure explained and questions answered to patient or proxy's satisfaction: yes   Pre-procedure details:    Skin preparation:  Alcohol Procedure details:    Location:  Foot   Foot location:  L big toe Anesthesia:    Anesthesia method:  Local infiltration   Local anesthetic:  Lidocaine 2% w/o epi Nail Removal:    Nail removed:  Partial   Nail side:  Medial   Nail bed repaired: no   Ingrown  nail:    Wedge excision of skin: yes     Nail matrix removed or ablated:  Partial Post-procedure details:    Dressing:  4x4 sterile gauze   Procedure completion:  Tolerated  (including critical care time)  Medications Ordered in UC Medications  lidocaine (XYLOCAINE) 2 % (with pres) injection 100 mg (100 mg Intradermal Given 10/15/23 1959)    Initial Impression / Assessment and Plan / UC Course  I have reviewed the triage vital signs and the nursing notes.  Pertinent labs & imaging results that were available during my care of the patient were reviewed by me and considered in my medical decision making (see chart for details).      Final Clinical Impressions(s) / UC Diagnoses   Final diagnoses:  Ingrown left big toenail   Acute, new concern Patient presents today with left great toe pain and tenderness along the medial aspect of the nail.  There is mild serous and purulent drainage from the area.  Discussed that this is likely an ingrown toenail and would require removal.  Reviewed pros and cons of removal as well as potential risks and alternatives.  Patient provided verbal consent to proceed with nail removal.  See procedure note for further detail.  Procedure was tolerated well and aftercare instructions were provided.  Will provide Silvadene cream to assist with antibacterial coverage.  Can use Tylenol as needed for pain management.  Reviewed aftercare as well as nail trimming recommendations to prevent recurrence.  ED and return precautions provided in after visit summary.  Follow-up as needed for progressing or persistent symptoms    Discharge Instructions      Your toe will likely be sore for the next few days.  You can soak it in warm water and Epsom salt.  Please make sure that you are keeping the area clean and dry.  You can use Tylenol as needed for pain management.  I have sent in a cream to help with antibiotic coverage.  This is called Silvadene and you should apply it  twice per day as needed.  If you notice any swelling, redness, warmth to the area, drainage that looks like pus or bleeding please come back or go see your PCP for further follow-up.     ED Prescriptions     Medication Sig Dispense Auth. Provider   silver sulfADIAZINE (SILVADENE) 1 % cream Apply 1 Application topically daily. 50 g Ridley Dileo E, PA-C      PDMP not reviewed this encounter.   Roselind Messier 10/15/23 2046

## 2023-10-28 ENCOUNTER — Other Ambulatory Visit: Payer: Self-pay

## 2023-10-28 ENCOUNTER — Ambulatory Visit (HOSPITAL_COMMUNITY)
Admission: EM | Admit: 2023-10-28 | Discharge: 2023-10-28 | Disposition: A | Discharge status: Home / Self Care | Payer: BLUE CROSS/BLUE SHIELD | Attending: Emergency Medicine | Admitting: Emergency Medicine

## 2023-10-28 ENCOUNTER — Encounter (HOSPITAL_COMMUNITY): Payer: Self-pay | Admitting: *Deleted

## 2023-10-28 DIAGNOSIS — R051 Acute cough: Secondary | ICD-10-CM

## 2023-10-28 DIAGNOSIS — J069 Acute upper respiratory infection, unspecified: Secondary | ICD-10-CM

## 2023-10-28 MED ORDER — AZITHROMYCIN 250 MG PO TABS: ORAL_TABLET | ORAL | 0 refills | Status: DC

## 2023-10-28 MED ORDER — PROMETHAZINE-DM 6.25-15 MG/5ML PO SYRP: 5.0000 mL | ORAL_SOLUTION | Freq: Every evening | ORAL | 0 refills | Status: DC | PRN

## 2023-10-28 MED ORDER — BENZONATATE 100 MG PO CAPS: 100.0000 mg | ORAL_CAPSULE | Freq: Three times a day (TID) | ORAL | 0 refills | Status: DC

## 2023-10-28 NOTE — Discharge Instructions (Addendum)
 Start taking azithromycin by taking 2 tablets today and 1 tablet on the remaining 4 days.  I prescribed Tessalon tat you can take every 8 hours as needed for cough.  I have also prescribed promethazine DM cough syrup that you can take at night for cough.  This can make you drowsy so do not take while driving.  Otherwise alternate between Tylenol and ibuprofen as needed for pain and fever.  I also recommend taking Mucinex to help with cough and congestion.  Return here if symptoms persist or worsen.  Comience a tomar azitromicina tomando 2 comprimidos hoy y 1 comprimido los 4 [employer].  Le recet Tessalon tat que puede tomar cada 8 horas segn sea necesario para la tos.  Tambin le recet jarabe para la tos con prometazina DM que puede tomar por la noche para la tos.  Esto puede causarle somnolencia, as que no lo tome mientras conduce.  De lo contrario, alterne entre Tylenol e ibuprofeno segn sea necesario para [employer] y la [place].  Tambin recomiendo tomar Mucinex para ayudar con la tos y la congestin.  Regrese aqu si los sntomas persisten o empeoran.

## 2023-10-28 NOTE — ED Triage Notes (Signed)
 Pt reports since Friday she has had chills ,sore throat , increased mucous  and HA.

## 2023-10-28 NOTE — ED Provider Notes (Signed)
[facility]    CSN: [id number] Arrival date & time: 10/28/23  1506      History   Chief Complaint Chief Complaint  Patient presents with   Sore Throat    HPI [name] [name] is a 53 y.o. female.   Patient presents productive cough with yellow/green sputum, congestion, sore throat, headache, and chills that began on 2/18. Denies known fever, shortness of breath, chest pain, abdominal pain, nausea, vomiting, and diarrhea.    Sore Throat    Past Medical History:  Diagnosis Date   Bilateral ovarian cysts    Chronic pelvic pain in female    Complication of anesthesia    Hemorrhoids    Hepatic steatosis 07/2006, 04/2014   on Abd CT done for pain   History of anal fissures    History of CVA (cerebrovascular accident) without residual deficits followed by pcp--- takes asa 81mg    04/ 2008--- due to cerebrovascular occlusion,  left basal ganglia subacute infarct;   per pt no residuals   History of ectopic pregnancy    2007--  treated with medication   History of hyperthyroidism    s/p  RAI   03-25-2001   Hydrosalpinx    Hypertension    followed by pcp   Hypothyroidism, postradioiodine therapy    followed by pcp---  multinodular goiter;   s/p  RAI 03-25-2001 for hyperthyroidism   PCOS (polycystic ovarian syndrome)    Pelvic adhesions    PONV (postoperative nausea and vomiting)    Seasonal asthma    followed by pcp--- no inhaler, takes singulair   Type II diabetes mellitus (HCC) dx'd 12/2015   followed by pcp ---   (11-06-2019  per pt does not check blood sugar at home)    Patient Active Problem List   Diagnosis Date Noted   Thrombocytopenia (HCC) 11/24/2022   History of acute pancreatitis 08/06/2022   Plantar fasciitis of left foot 01/22/2022   Breast mass 05/31/2021   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   Mass of occipital region 01/11/2017   Health maintenance examination 01/11/2017   Type 2 diabetes mellitus with other specified  complication (HCC) 10/12/2016   Endometriosis    Chronic female pelvic pain 05/07/2012   Migraine headache 05/12/2007   History of ischemic stroke 12/12/2006   Hypothyroidism 10/31/2006   Dyslipidemia associated with type 2 diabetes mellitus (HCC) 10/31/2006   Body mass index (BMI) 25.0-25.9, adult 10/31/2006   HYPERTENSION, BENIGN SYSTEMIC 10/31/2006   Asthma 10/31/2006    Past Surgical History:  Procedure Laterality Date   ABDOMINAL HYSTERECTOMY  04/24/2012   Procedure: HYSTERECTOMY ABDOMINAL;  Surgeon: Ok Edwards, MD;  Location: [facility];  Service: Gynecology;;   ANAL SPHINCTEROTOMY     APPENDECTOMY  1997   ruptured   CERVICAL CERCLAGE  11-06-2008;  10-07-2010  @[facility]    CESAREAN SECTION W/BTL Bilateral 12/2010   bilateral tubal ligation with filshie clips   COLONOSCOPY  05/2019   anal fissure, polyps (TA, HP), rpt 7 yrs ([facility] @ [facility])   ESOPHAGOGASTRODUODENOSCOPY  10/2014   WNL   EXCISION OF ENDOMETRIOMA N/A 08/20/2016   Procedure: EXCISION OF ENDOMETRIOMA LEFT LOWER QUADRANT;  Surgeon: Violeta Gelinas, MD;  Location: [facility];  Service: General;  Laterality: N/A;   EXPLORATORY LAPAROTOMY  03/2004   WITH LYSIS ADHESIONS   FLEXIBLE SIGMOIDOSCOPY  10/2018   s/p botox injection for anal fissure ([facility] @ [facility])   LAPAROSCOPIC CHOLECYSTECTOMY  09/1995   LAPAROSCOPIC HYSTERECTOMY  04/24/2012  Procedure: HYSTERECTOMY TOTAL LAPAROSCOPIC;  Surgeon: Ok Edwards, MD;  Location: [facility];  Service: Gynecology;  Laterality: N/A;  2 1/2 hours OR time.  Dr. Audie Box to assisting    LAPAROSCOPIC LYSIS INTESTINAL ADHESIONS     LAPAROSCOPIC SALPINGO OOPHERECTOMY Bilateral 11/11/2019   diagnostic laparoscopy unsuccessful - extensive adhesions, unable to visualize adnexa (Anyanwu, Jethro Bastos, MD)    OB History     Gravida  5   Para  1   Term  0   Preterm  1   AB  3   Living         SAB  3   IAB  0   Ectopic  0   Multiple      Live Births                Home Medications    Prior to Admission medications   Medication Sig Start Date End Date Taking? Authorizing Provider  acetaminophen (TYLENOL) 500 MG tablet Take 1,000 mg by mouth as needed for moderate pain.    Yes [provider]  albuterol (VENTOLIN HFA) 108 (90 Base) MCG/ACT inhaler INHALE 2 PUFFS INTO THE LUNGS EVERY 6 HOURS AS NEEDED FOR WHEEZING OR SHORTNESS OF BREATH 03/29/23 03/28/24 Yes Eustaquio Boyden, MD  aspirin 81 MG tablet Take 1 tablet (81 mg total) by mouth daily. 01/11/17  Yes Eustaquio Boyden, MD  atorvastatin (LIPITOR) 80 MG tablet Take 1 tablet (80 mg total) by mouth daily. 04/07/23  Yes Eustaquio Boyden, MD  azithromycin (ZITHROMAX Z-PAK) 250 MG tablet Take 2 pills (500mg ) first day and one pill (250mg ) the remaining 4 days. 10/28/23  Yes Susann Givens, Valeriano Bain A, NP  beclomethasone (QVAR REDIHALER) 80 MCG/ACT inhaler Inhale 1 puff into the lungs 2 (two) times daily. 03/29/23  Yes Eustaquio Boyden, MD  benzonatate (TESSALON) 100 MG capsule Take 1 capsule (100 mg total) by mouth every 8 (eight) hours. 10/28/23  Yes Wynonia Lawman A, NP  Calcium Citrate-Vitamin D (CALCIUM + D PO) Take 1 tablet by mouth daily.    Yes [provider]  cyanocobalamin (VITAMIN B12) 1000 MCG tablet Take 1 tablet (1,000 mcg total) by mouth daily. 03/29/23  Yes Eustaquio Boyden, MD  docusate sodium (COLACE) 100 MG capsule Take 1 capsule (100 mg total) by mouth every 12 (twelve) hours. 08/04/23  Yes Rinaldo Ratel, [place] N, FNP  fluticasone [facility]) 50 MCG/ACT nasal spray Place 2 sprays into both nostrils daily. 10/01/20  Yes Covington, Sarah M, PA-C  levothyroxine (SYNTHROID) 150 MCG tablet Take 1 tablet (150 mcg total) by mouth every morning. 07/13/23  Yes Eustaquio Boyden, MD  lisinopril-hydrochlorothiazide (ZESTORETIC) 20-25 MG tablet Take 1 tablet by mouth daily. 03/29/23  Yes Eustaquio Boyden, MD  metFORMIN (GLUCOPHAGE-XR) 500 MG 24 hr tablet Take 2 tablets by mouth daily with  breakfast. 03/29/23  Yes Eustaquio Boyden, MD  montelukast (SINGULAIR) 10 MG tablet Take 1 tablet (10 mg total) by mouth at bedtime. 03/29/23 03/28/24 Yes Eustaquio Boyden, MD  pioglitazone (ACTOS) 15 MG tablet Take 1 tablet (15 mg total) by mouth daily. 07/10/23  Yes Eustaquio Boyden, MD  promethazine-dextromethorphan (PROMETHAZINE-DM) 6.25-15 MG/5ML syrup Take 5 mLs by mouth at bedtime as needed for cough. 10/28/23  Yes Wynonia Lawman A, NP  vitamin E 180 MG (400 UNITS) capsule Take 400 Units by mouth daily.   Yes [provider]  Blood Glucose Monitoring Suppl (BLOOD GLUCOSE MONITOR SYSTEM) w/Device KIT Use to test blood sugar in the morning, at noon, and at bedtime.  03/29/23   Eustaquio Boyden, MD  hydrocortisone (ANUSOL-HC) 2.5 % rectal cream Place 1 Application rectally 2 (two) times daily. 08/04/23   Garrison, [place] N, FNP  silver sulfADIAZINE (SILVADENE) 1 % cream Apply 1 Application topically daily. 10/15/23   Mecum, Oswaldo Conroy, PA-C    Family History Family History  Problem Relation Age of Onset   Hypertension Father    Stroke Father        hemorrhagic, deceased   Diabetes Mother    Uterine cancer Mother    Coronary artery disease Neg Hx    Cancer Neg Hx     Social History Social History   Tobacco Use   Smoking status: Never   Smokeless tobacco: Never  Vaping Use   Vaping status: Never Used  Substance Use Topics   Alcohol use: No   Drug use: No     Allergies   Covid-19 (mrna) vaccine (pfizer) [covid-19 (mrna) vaccine], Meloxicam, Trulicity [dulaglutide], Amoxicillin, and Voltaren [diclofenac sodium]   Review of Systems Review of Systems  Per HPI  Physical Exam Triage Vital Signs ED Triage Vitals  Encounter Vitals Group     BP 10/28/23 1647 124/83     Systolic BP Percentile --      Diastolic BP Percentile --      Pulse Rate 10/28/23 1647 88     Resp 10/28/23 1647 20     Temp 10/28/23 1647 98 F (36.7 C)     Temp src --      SpO2 10/28/23 1647 95  %     Weight --      Height --      Head Circumference --      Peak Flow --      Pain Score 10/28/23 1644 8     Pain Loc --      Pain Education --      Exclude from Growth Chart --    No data found.  Updated Vital Signs BP 124/83   Pulse 88   Temp 98 F (36.7 C)   Resp 20   LMP 11/12/2011   SpO2 95%   Visual Acuity Right Eye Distance:   Left Eye Distance:   Bilateral Distance:    Right Eye Near:   Left Eye Near:    Bilateral Near:     Physical Exam Vitals and nursing note reviewed.  Constitutional:      General: She is awake. She is not in acute distress.    Appearance: Normal appearance. She is well-developed and well-groomed. She is not ill-appearing.  HENT:     Right Ear: Tympanic membrane, ear canal and external ear normal.     Left Ear: Tympanic membrane, ear canal and external ear normal.     Nose: Congestion and rhinorrhea present.     Mouth/Throat:     Mouth: Mucous membranes are moist.     Pharynx: Posterior oropharyngeal erythema present. No oropharyngeal exudate.  Cardiovascular:     Rate and Rhythm: Normal rate and regular rhythm.  Pulmonary:     Effort: Pulmonary effort is normal.     Breath sounds: Normal breath sounds.  Musculoskeletal:        General: Normal range of motion.  Skin:    General: Skin is warm and dry.  Neurological:     Mental Status: She is alert.  Psychiatric:        Behavior: Behavior is cooperative.      UC Treatments / Results  Labs (all labs  ordered are listed, but only abnormal results are displayed) Labs Reviewed - No data to display  EKG   Radiology No results found.  Procedures Procedures (including critical care time)  Medications Ordered in UC Medications - No data to display  Initial Impression / Assessment and Plan / UC Course  I have reviewed the triage vital signs and the nursing notes.  Pertinent labs & imaging results that were available during my care of the patient were reviewed by me and  considered in my medical decision making (see chart for details).     Patient presented with productive cough with yellow/green sputum, congestion, sore throat, headache, and chills that began on 2/18.  Upon assessment congestion and rhinorrhea are present, mild erythema noted to pharynx. Lungs clear bilaterally on auscultation.  Prescribed azithromycin for persistent upper respiratory infection. Prescribed Tessalon and Promethazine DM as needed for cough. Discussed OTC medication for symptoms. Discussed return precautions.  Final Clinical Impressions(s) / UC Diagnoses   Final diagnoses:  Acute upper respiratory infection  Acute cough     Discharge Instructions      Start taking azithromycin by taking 2 tablets today and 1 tablet on the remaining 4 days.  I prescribed Tessalon tat you can take every 8 hours as needed for cough.  I have also prescribed promethazine DM cough syrup that you can take at night for cough.  This can make you drowsy so do not take while driving.  Otherwise alternate between Tylenol and ibuprofen as needed for pain and fever.  I also recommend taking Mucinex to help with cough and congestion.  Return here if symptoms persist or worsen.  Comience a tomar azitromicina tomando 2 comprimidos hoy y 1 comprimido los 4 [employer].  Le recet Tessalon tat que puede tomar cada 8 horas segn sea necesario para la tos.  Tambin le recet jarabe para la tos con prometazina DM que puede tomar por la noche para la tos.  Esto puede causarle somnolencia, as que no lo tome mientras conduce.  De lo contrario, alterne entre Tylenol e ibuprofeno segn sea necesario para [employer] y la [place].  Tambin recomiendo tomar Mucinex para ayudar con la tos y la congestin.  Regrese aqu si los sntomas persisten o empeoran.    ED Prescriptions     Medication Sig Dispense Auth. Provider   azithromycin (ZITHROMAX Z-PAK) 250 MG tablet Take 2 pills (500mg ) first day and one pill (250mg ) the  remaining 4 days. 6 tablet Wynonia Lawman A, NP   benzonatate (TESSALON) 100 MG capsule Take 1 capsule (100 mg total) by mouth every 8 (eight) hours. 21 capsule Wynonia Lawman A, NP   promethazine-dextromethorphan (PROMETHAZINE-DM) 6.25-15 MG/5ML syrup Take 5 mLs by mouth at bedtime as needed for cough. 118 mL Wynonia Lawman A, NP      PDMP not reviewed this encounter.   Wynonia Lawman A, NP 10/28/23 [phone]

## 2023-11-18 ENCOUNTER — Other Ambulatory Visit (HOSPITAL_COMMUNITY): Payer: Self-pay

## 2023-11-28 ENCOUNTER — Other Ambulatory Visit (HOSPITAL_COMMUNITY): Payer: Self-pay

## 2023-11-29 ENCOUNTER — Other Ambulatory Visit (HOSPITAL_COMMUNITY): Payer: Self-pay

## 2024-01-04 ENCOUNTER — Other Ambulatory Visit (HOSPITAL_COMMUNITY): Payer: Self-pay

## 2024-01-20 ENCOUNTER — Encounter (HOSPITAL_COMMUNITY): Payer: Self-pay | Admitting: *Deleted

## 2024-01-20 ENCOUNTER — Ambulatory Visit (HOSPITAL_COMMUNITY)
Admission: EM | Admit: 2024-01-20 | Discharge: 2024-01-20 | Disposition: A | Discharge status: Home / Self Care | Payer: Self-pay | Attending: Emergency Medicine | Admitting: Emergency Medicine

## 2024-01-20 ENCOUNTER — Other Ambulatory Visit: Payer: Self-pay

## 2024-01-20 DIAGNOSIS — R112 Nausea with vomiting, unspecified: Secondary | ICD-10-CM

## 2024-01-20 DIAGNOSIS — R109 Unspecified abdominal pain: Secondary | ICD-10-CM

## 2024-01-20 LAB — COMPREHENSIVE METABOLIC PANEL WITH GFR
ALT: 26 U/L (ref 0–44)
AST: 25 U/L (ref 15–41)
Albumin: 3.7 g/dL (ref 3.5–5.0)
Alkaline Phosphatase: 80 U/L (ref 38–126)
Anion gap: 12 (ref 5–15)
BUN: 14 mg/dL (ref 6–20)
CO2: 25 mmol/L (ref 22–32)
Calcium: 9.3 mg/dL (ref 8.9–10.3)
Chloride: 100 mmol/L (ref 98–111)
Creatinine, Ser: 0.91 mg/dL (ref 0.44–1.00)
GFR, Estimated: >60 mL/min (ref >60)
Glucose, Bld: 155 mg/dL — ABNORMAL HIGH (ref 70–99)
Potassium: 3.4 mmol/L — ABNORMAL LOW (ref 3.5–5.1)
Sodium: 137 mmol/L (ref 135–145)
Total Bilirubin: 0.7 mg/dL (ref 0.0–1.2)
Total Protein: 7.2 g/dL (ref 6.5–8.1)

## 2024-01-20 LAB — CBC WITH DIFFERENTIAL/PLATELET
Abs Immature Granulocytes: 0.03 10*3/uL (ref 0.00–0.07)
Basophils Absolute: 0.1 10*3/uL (ref 0.0–0.1)
Basophils Relative: 1 %
Eosinophils Absolute: 0.2 10*3/uL (ref 0.0–0.5)
Eosinophils Relative: 3 %
HCT: 40.7 % (ref 36.0–46.0)
Hemoglobin: 13.5 g/dL (ref 12.0–15.0)
Immature Granulocytes: 0 %
Lymphocytes Relative: 30 %
Lymphs Abs: 2.3 10*3/uL (ref 0.7–4.0)
MCH: 29.2 pg (ref 26.0–34.0)
MCHC: 33.2 g/dL (ref 30.0–36.0)
MCV: 87.9 fL (ref 80.0–100.0)
Monocytes Absolute: 0.6 10*3/uL (ref 0.1–1.0)
Monocytes Relative: 8 %
Neutro Abs: 4.4 10*3/uL (ref 1.7–7.7)
Neutrophils Relative %: 58 %
Platelets: 151 10*3/uL (ref 150–400)
RBC: 4.63 MIL/uL (ref 3.87–5.11)
RDW: 12.9 % (ref 11.5–15.5)
WBC: 7.6 10*3/uL (ref 4.0–10.5)
nRBC: 0 % (ref 0.0–0.2)

## 2024-01-20 LAB — POCT URINALYSIS DIP (MANUAL ENTRY)
Bilirubin, UA: NEGATIVE
Blood, UA: NEGATIVE
Glucose, UA: NEGATIVE mg/dL
Ketones, POC UA: NEGATIVE mg/dL
Leukocytes, UA: NEGATIVE
Nitrite, UA: NEGATIVE
Protein Ur, POC: NEGATIVE mg/dL
Spec Grav, UA: 1.025 (ref 1.010–1.025)
Urobilinogen, UA: 0.2 U/dL
pH, UA: 5.5 (ref 5.0–8.0)

## 2024-01-20 LAB — LIPASE, BLOOD: Lipase: 40 U/L (ref 11–51)

## 2024-01-20 MED ORDER — ONDANSETRON 4 MG PO TBDP: 4.0000 mg | ORAL_TABLET | Freq: Three times a day (TID) | ORAL | 0 refills | Status: DC | PRN

## 2024-01-20 MED ORDER — ONDANSETRON 4 MG PO TBDP
ORAL_TABLET | ORAL | Status: AC
  Filled 2024-01-20: qty 1

## 2024-01-20 MED ORDER — ACETAMINOPHEN 325 MG PO TABS
ORAL_TABLET | ORAL | Status: AC
  Filled 2024-01-20: qty 3

## 2024-01-20 MED ORDER — ACETAMINOPHEN 325 MG PO TABS
975.0000 mg | ORAL_TABLET | Freq: Once | ORAL | Status: AC
  Administered 2024-01-20: 975 mg via ORAL

## 2024-01-20 MED ORDER — ONDANSETRON 4 MG PO TBDP
4.0000 mg | ORAL_TABLET | Freq: Once | ORAL | Status: AC
  Administered 2024-01-20: 4 mg via ORAL

## 2024-01-20 NOTE — Discharge Instructions (Addendum)
 Use el medicamento para las nuseas cada 8 horas segn sea necesario. Tome solo pequeos sorbos de agua hoy. A partir de maana puede combinar caldo con ms sorbos de agua. Si le va bien por la noche, puede probar una sopa. Al da siguiente puede probar alimentos slidos [employer] pltanos, pollo hervido, arroz blanco y [place].  Nuestro personal le llamar si necesita un seguimiento urgente de sus anlisis de laboratorio. Busque atencin inmediata en el servicio de urgencias ms cercano si sus sntomas empeoran.  Use the nausea medicine every 8 hours as needed.  Take only small sips of water today.  Starting tomorrow you can integrate broth and more sips of water.  If this goes well for the evening you can try soup.  The next day you can try bland solids such as bananas, boiled chicken, white rice, and toast.  Our staff will call you if anything requires urgent follow-up with your lab work.  Seek immediate care at the nearest emergency department if your symptoms worsen in any way.

## 2024-01-20 NOTE — ED Triage Notes (Addendum)
 Pt declines interpreter.  C/O intermittent posterior HA onset 2 days ago. Also describes transient LUQ that started yesterday, becoming more constant and strong today. C/O nausea and vomiting along with fatigue today. Denies diarrhea. Denies fevers.

## 2024-01-20 NOTE — ED Provider Notes (Signed)
[facility]    CSN: [id number] Arrival date & time: 01/20/24  1648      History   Chief Complaint Chief Complaint  Patient presents with   Headache   Abdominal Pain    HPI [name] is a 53 y.o. female.   Patient presents to clinic with her daughter, who provides interpretation.  Patient has been having left-sided abdominal pain since yesterday as well as nausea, vomiting and headache.  Will intermittently have diarrhea and constipation, this is ongoing.  Has not had any fevers.  Last emesis was around 2 hours ago.  History of appendectomy, c-section, and hysterectomy. Hx of acute pancreatitis, bilateral ovarian cysts, chronic pelvic pain, hepatic steatosis, CVA hypertension, PCOS and type 2 diabetes.  The history is provided by the patient and medical records.  Headache Abdominal Pain   Past Medical History:  Diagnosis Date   Bilateral ovarian cysts    Chronic pelvic pain in female    Complication of anesthesia    Hemorrhoids    Hepatic steatosis 07/2006, 04/2014   on Abd CT done for pain   History of anal fissures    History of CVA (cerebrovascular accident) without residual deficits followed by pcp--- takes asa 81mg    04/ 2008--- due to cerebrovascular occlusion,  left basal ganglia subacute infarct;   per pt no residuals   History of ectopic pregnancy    2007--  treated with medication   History of hyperthyroidism    s/p  RAI   03-25-2001   Hydrosalpinx    Hypertension    followed by pcp   Hypothyroidism, postradioiodine therapy    followed by pcp---  multinodular goiter;   s/p  RAI 03-25-2001 for hyperthyroidism   PCOS (polycystic ovarian syndrome)    Pelvic adhesions    PONV (postoperative nausea and vomiting)    Seasonal asthma    followed by pcp--- no inhaler, takes singulair    Type II diabetes mellitus (HCC) dx'd 12/2015   followed by pcp ---   (11-06-2019  per pt does not check blood sugar at home)    Patient Active  Problem List   Diagnosis Date Noted   Thrombocytopenia (HCC) 11/24/2022   History of acute pancreatitis 08/06/2022   Plantar fasciitis of left foot 01/22/2022   Breast mass 05/31/2021   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   Mass of occipital region 01/11/2017   Health maintenance examination 01/11/2017   Type 2 diabetes mellitus with other specified complication (HCC) 10/12/2016   Endometriosis    Chronic female pelvic pain 05/07/2012   Migraine headache 05/12/2007   History of ischemic stroke 12/12/2006   Hypothyroidism 10/31/2006   Dyslipidemia associated with type 2 diabetes mellitus (HCC) 10/31/2006   Body mass index (BMI) 25.0-25.9, adult 10/31/2006   HYPERTENSION, BENIGN SYSTEMIC 10/31/2006   Asthma 10/31/2006    Past Surgical History:  Procedure Laterality Date   ABDOMINAL HYSTERECTOMY  04/24/2012   Procedure: HYSTERECTOMY ABDOMINAL;  Surgeon: Davia Erps, MD;  Location: [facility];  Service: Gynecology;;   ANAL SPHINCTEROTOMY     APPENDECTOMY  1997   ruptured   CERVICAL CERCLAGE  11-06-2008;  10-07-2010  @[facility]    CESAREAN SECTION W/BTL Bilateral 12/2010   bilateral tubal ligation with filshie clips   COLONOSCOPY  05/2019   anal fissure, polyps (TA, HP), rpt 7 yrs ([facility] @ [facility])   ESOPHAGOGASTRODUODENOSCOPY  10/2014   WNL   EXCISION OF ENDOMETRIOMA N/A 08/20/2016   Procedure: EXCISION OF ENDOMETRIOMA LEFT LOWER QUADRANT;  Surgeon: Dorena Gander, MD;  Location: [facility];  Service: General;  Laterality: N/A;   EXPLORATORY LAPAROTOMY  03/2004   WITH LYSIS ADHESIONS   FLEXIBLE SIGMOIDOSCOPY  10/2018   s/p botox injection for anal fissure ([facility] @ [facility])   LAPAROSCOPIC CHOLECYSTECTOMY  09/1995   LAPAROSCOPIC HYSTERECTOMY  04/24/2012   Procedure: HYSTERECTOMY TOTAL LAPAROSCOPIC;  Surgeon: Davia Erps, MD;  Location: [facility];  Service: Gynecology;  Laterality: N/A;  2 1/2 hours OR time.  Dr. Sharie Dauphin to assisting    LAPAROSCOPIC LYSIS INTESTINAL  ADHESIONS     LAPAROSCOPIC SALPINGO OOPHERECTOMY Bilateral 11/11/2019   diagnostic laparoscopy unsuccessful - extensive adhesions, unable to visualize adnexa (Anyanwu, Kathrine Paris, MD)    OB History     Gravida  5   Para  1   Term  0   Preterm  1   AB  3   Living         SAB  3   IAB  0   Ectopic  0   Multiple      Live Births               Home Medications    Prior to Admission medications   Medication Sig Start Date End Date Taking? Authorizing Provider  aspirin  81 MG tablet Take 1 tablet (81 mg total) by mouth daily. 01/11/17  Yes Claire Crick, MD  atorvastatin  (LIPITOR) 80 MG tablet Take 1 tablet (80 mg total) by mouth daily. 04/07/23  Yes Claire Crick, MD  beclomethasone (QVAR  REDIHALER) 80 MCG/ACT inhaler Inhale 1 puff into the lungs 2 (two) times daily. 03/29/23  Yes Claire Crick, MD  Calcium  Citrate-Vitamin D (CALCIUM  + D PO) Take 1 tablet by mouth daily.    Yes [provider]  cyanocobalamin (VITAMIN B12) 1000 MCG tablet Take 1 tablet (1,000 mcg total) by mouth daily. 03/29/23  Yes Claire Crick, MD  docusate sodium  (COLACE) 100 MG capsule Take 1 capsule (100 mg total) by mouth every 12 (twelve) hours. 08/04/23  Yes Jalaya Sarver  N, FNP  fluticasone  (FLONASE ) 50 MCG/ACT nasal spray Place 2 sprays into both nostrils daily. 10/01/20  Yes Covington, Sarah M, PA-C  levothyroxine  (SYNTHROID ) 150 MCG tablet Take 1 tablet (150 mcg total) by mouth every morning. 07/13/23  Yes Claire Crick, MD  lisinopril -hydrochlorothiazide  (ZESTORETIC ) 20-25 MG tablet Take 1 tablet by mouth daily. 03/29/23  Yes Claire Crick, MD  metFORMIN  (GLUCOPHAGE -XR) 500 MG 24 hr tablet Take 2 tablets by mouth daily with breakfast. 03/29/23  Yes Claire Crick, MD  montelukast  (SINGULAIR ) 10 MG tablet Take 1 tablet (10 mg total) by mouth at bedtime. 03/29/23 03/28/24 Yes Claire Crick, MD  Moringa Oleifera (MORINGA PO) Take by mouth.   Yes [provider]  ondansetron  (ZOFRAN -ODT) 4 MG disintegrating tablet Take 1 tablet (4 mg total) by mouth every 8 (eight) hours as needed for nausea or vomiting. 01/20/24  Yes Harlow Lighter, Ashaya Raftery  N, FNP  pioglitazone  (ACTOS ) 15 MG tablet Take 1 tablet (15 mg total) by mouth daily. 07/10/23  Yes Claire Crick, MD  vitamin E 180 MG (400 UNITS) capsule Take 400 Units by mouth daily.   Yes [provider]  acetaminophen  (TYLENOL ) 500 MG tablet Take 1,000 mg by mouth as needed for moderate pain.     [provider]  albuterol  (VENTOLIN  HFA) 108 (90 Base) MCG/ACT inhaler INHALE 2 PUFFS INTO THE LUNGS EVERY 6 HOURS AS NEEDED FOR WHEEZING OR SHORTNESS OF BREATH 03/29/23 03/28/24  Claire Crick, MD  azithromycin  (ZITHROMAX  Z-PAK) 250 MG tablet Take 2 pills (500mg ) first day and one pill (250mg ) the remaining 4 days. 10/28/23   Levora Reas A, NP  benzonatate  (TESSALON ) 100 MG capsule Take 1 capsule (100 mg total) by mouth every 8 (eight) hours. 10/28/23   Levora Reas A, NP  Blood Glucose Monitoring Suppl (BLOOD GLUCOSE MONITOR SYSTEM) w/Device KIT Use to test blood sugar in the morning, at noon, and at bedtime. 03/29/23   Claire Crick, MD  hydrocortisone  (ANUSOL -HC) 2.5 % rectal cream Place 1 Application rectally 2 (two) times daily. 08/04/23   Harlow Lighter, Kaileah Shevchenko  N, FNP  promethazine -dextromethorphan (PROMETHAZINE -DM) 6.25-15 MG/5ML syrup Take 5 mLs by mouth at bedtime as needed for cough. 10/28/23   Levora Reas A, NP  silver  sulfADIAZINE  (SILVADENE ) 1 % cream Apply 1 Application topically daily. 10/15/23   Mecum, Pearla Bottom, PA-C    Family History Family History  Problem Relation Age of Onset   Hypertension Father    Stroke Father        hemorrhagic, deceased   Diabetes Mother    Uterine cancer Mother    Coronary artery disease Neg Hx    Cancer Neg Hx     Social History Social History   Tobacco Use   Smoking status: Never   Smokeless tobacco: Never  Vaping Use    Vaping status: Never Used  Substance Use Topics   Alcohol use: No   Drug use: No     Allergies   Covid-19 (mrna) vaccine (pfizer) [covid-19 (mrna) vaccine], Meloxicam , Trulicity  [dulaglutide ], Amoxicillin , and Voltaren  [diclofenac  sodium]   Review of Systems Review of Systems  Per HPI  Physical Exam Triage Vital Signs ED Triage Vitals  Encounter Vitals Group     BP 01/20/24 1821 127/87     Systolic BP Percentile --      Diastolic BP Percentile --      Pulse Rate 01/20/24 1821 84     Resp 01/20/24 1821 18     Temp 01/20/24 1821 98.1 F (36.7 C)     Temp Source 01/20/24 1821 Oral     SpO2 01/20/24 1821 96 %     Weight --      Height --      Head Circumference --      Peak Flow --      Pain Score 01/20/24 1822 8     Pain Loc --      Pain Education --      Exclude from Growth Chart --    No data found.  Updated Vital Signs BP 127/87   Pulse 84   Temp 98.1 F (36.7 C) (Oral)   Resp 18   LMP 11/12/2011   SpO2 96%   Visual Acuity Right Eye Distance:   Left Eye Distance:   Bilateral Distance:    Right Eye Near:   Left Eye Near:    Bilateral Near:     Physical Exam Vitals and nursing note reviewed.  Constitutional:      Appearance: She is well-developed.  HENT:     Head: Normocephalic and atraumatic.     Mouth/Throat:     Mouth: Mucous membranes are moist.  Eyes:     General: No scleral icterus. Cardiovascular:     Rate and Rhythm: Normal rate.  Pulmonary:     Effort: Pulmonary effort is normal. No respiratory distress.  Abdominal:     General: Abdomen is flat. Bowel sounds are normal.  Palpations: Abdomen is soft.     Tenderness: There is abdominal tenderness in the left upper quadrant and left lower quadrant. There is no guarding or rebound.  Skin:    General: Skin is warm and dry.  Neurological:     General: No focal deficit present.     Mental Status: She is alert.  Psychiatric:        Mood and Affect: Mood normal.      UC  Treatments / Results  Labs (all labs ordered are listed, but only abnormal results are displayed) Labs Reviewed  COMPREHENSIVE METABOLIC PANEL WITH GFR  CBC WITH DIFFERENTIAL/PLATELET  LIPASE, BLOOD  POCT URINALYSIS DIP (MANUAL ENTRY)    EKG   Radiology No results found.  Procedures Procedures (including critical care time)  Medications Ordered in UC Medications  ondansetron  (ZOFRAN -ODT) disintegrating tablet 4 mg (4 mg Oral Given 01/20/24 1858)  acetaminophen  (TYLENOL ) tablet 975 mg (975 mg Oral Given 01/20/24 1931)    Initial Impression / Assessment and Plan / UC Course  I have reviewed the triage vital signs and the nursing notes.  Pertinent labs & imaging results that were available during my care of the patient were reviewed by me and considered in my medical decision making (see chart for details).  Vitals in triage reviewed, patient is hemodynamically stable.  Abdomen is soft with active bowel sounds.  Left upper and lower abdominal tenderness to palpation, with no rebound or guarding.  Tolerating sips of water after Zofran  administration.  Tylenol  given in clinic for pain.  Low concern for acute abdomen at this time.  Suspicious for pancreatitis, hepatitis, or other abdominal etiology.  With history of pancreatitis will encourage as needed nausea medicine.  N.p.o. throughout the rest of the day gradual diet progression.  Strict emergency precautions given if symptoms evolve or worsen.     Final Clinical Impressions(s) / UC Diagnoses   Final diagnoses:  Abdominal pain, unspecified abdominal location  Nausea and vomiting, unspecified vomiting type     Discharge Instructions      Use el medicamento para las nuseas cada 8 horas segn sea necesario. Tome solo pequeos sorbos de agua hoy. A partir de maana puede combinar caldo con ms sorbos de agua. Si le va bien por la noche, puede probar una sopa. Al da siguiente puede probar alimentos slidos [employer]  pltanos, pollo hervido, arroz blanco y [place].  Nuestro personal le llamar si necesita un seguimiento urgente de sus anlisis de laboratorio. Busque atencin inmediata en el servicio de urgencias ms cercano si sus sntomas empeoran.  Use the nausea medicine every 8 hours as needed.  Take only small sips of water today.  Starting tomorrow you can integrate broth and more sips of water.  If this goes well for the evening you can try soup.  The next day you can try bland solids such as bananas, boiled chicken, white rice, and toast.  Our staff will call you if anything requires urgent follow-up with your lab work.  Seek immediate care at the nearest emergency department if your symptoms worsen in any way.    ED Prescriptions     Medication Sig Dispense Auth. Provider   ondansetron  (ZOFRAN -ODT) 4 MG disintegrating tablet Take 1 tablet (4 mg total) by mouth every 8 (eight) hours as needed for nausea or vomiting. 20 tablet Hyacinth Mail, FNP      PDMP not reviewed this encounter.   Ruthann Cover, FNP 01/20/24 1941

## 2024-01-21 ENCOUNTER — Ambulatory Visit (HOSPITAL_COMMUNITY): Payer: Self-pay

## 2024-02-17 ENCOUNTER — Other Ambulatory Visit: Payer: Self-pay

## 2024-02-17 ENCOUNTER — Other Ambulatory Visit (HOSPITAL_COMMUNITY): Payer: Self-pay

## 2024-04-01 ENCOUNTER — Other Ambulatory Visit: Payer: Self-pay

## 2024-05-11 ENCOUNTER — Other Ambulatory Visit (HOSPITAL_COMMUNITY): Payer: Self-pay

## 2024-05-19 ENCOUNTER — Other Ambulatory Visit (HOSPITAL_COMMUNITY): Payer: Self-pay

## 2024-06-16 ENCOUNTER — Other Ambulatory Visit (HOSPITAL_COMMUNITY): Payer: Self-pay

## 2024-06-16 ENCOUNTER — Other Ambulatory Visit: Payer: Self-pay | Admitting: Family Medicine

## 2024-06-16 DIAGNOSIS — I10 Essential (primary) hypertension: Secondary | ICD-10-CM

## 2024-06-16 MED ORDER — LISINOPRIL-HYDROCHLOROTHIAZIDE 20-25 MG PO TABS
1.0000 | ORAL_TABLET | Freq: Every day | ORAL | 0 refills | Status: DC
  Filled 2024-06-16: qty 90, 90d supply, fill #0

## 2024-06-16 NOTE — Telephone Encounter (Signed)
 E-scribed refill.  Pls schedule CPE and fasting labs for additional refills.

## 2024-08-11 ENCOUNTER — Other Ambulatory Visit (HOSPITAL_COMMUNITY): Payer: Self-pay

## 2024-08-11 ENCOUNTER — Other Ambulatory Visit: Payer: Self-pay | Admitting: Family Medicine

## 2024-08-11 DIAGNOSIS — E039 Hypothyroidism, unspecified: Secondary | ICD-10-CM

## 2024-08-11 MED ORDER — LEVOTHYROXINE SODIUM 150 MCG PO TABS
150.0000 ug | ORAL_TABLET | Freq: Every morning | ORAL | 0 refills | Status: DC
  Filled 2024-08-11: qty 90, 90d supply, fill #0

## 2024-08-11 MED ORDER — PIOGLITAZONE HCL 15 MG PO TABS
15.0000 mg | ORAL_TABLET | Freq: Every day | ORAL | 0 refills | Status: DC
  Filled 2024-08-11: qty 90, 90d supply, fill #0

## 2024-08-11 NOTE — Telephone Encounter (Signed)
 ERx

## 2024-08-18 ENCOUNTER — Other Ambulatory Visit (HOSPITAL_COMMUNITY): Payer: Self-pay

## 2024-08-23 ENCOUNTER — Other Ambulatory Visit: Payer: Self-pay | Admitting: Family Medicine

## 2024-08-23 DIAGNOSIS — E1169 Type 2 diabetes mellitus with other specified complication: Secondary | ICD-10-CM

## 2024-08-23 DIAGNOSIS — E039 Hypothyroidism, unspecified: Secondary | ICD-10-CM

## 2024-08-23 DIAGNOSIS — D696 Thrombocytopenia, unspecified: Secondary | ICD-10-CM

## 2024-08-25 ENCOUNTER — Other Ambulatory Visit: Payer: Self-pay

## 2024-08-25 DIAGNOSIS — E785 Hyperlipidemia, unspecified: Secondary | ICD-10-CM

## 2024-08-25 DIAGNOSIS — E1169 Type 2 diabetes mellitus with other specified complication: Secondary | ICD-10-CM

## 2024-08-25 DIAGNOSIS — D696 Thrombocytopenia, unspecified: Secondary | ICD-10-CM

## 2024-08-25 DIAGNOSIS — E039 Hypothyroidism, unspecified: Secondary | ICD-10-CM

## 2024-08-25 LAB — COMPREHENSIVE METABOLIC PANEL WITH GFR
ALT: 57 U/L — ABNORMAL HIGH (ref 3–35)
AST: 28 U/L (ref 5–37)
Albumin: 4.5 g/dL (ref 3.5–5.2)
Alkaline Phosphatase: 109 U/L (ref 39–117)
BUN: 17 mg/dL (ref 6–23)
CO2: 29 meq/L (ref 19–32)
Calcium: 9.7 mg/dL (ref 8.4–10.5)
Chloride: 96 meq/L (ref 96–112)
Creatinine, Ser: 0.9 mg/dL (ref 0.40–1.20)
GFR: 72.83 mL/min
Glucose, Bld: 200 mg/dL — ABNORMAL HIGH (ref 70–99)
Potassium: 4.5 meq/L (ref 3.5–5.1)
Sodium: 134 meq/L — ABNORMAL LOW (ref 135–145)
Total Bilirubin: 0.3 mg/dL (ref 0.2–1.2)
Total Protein: 7.8 g/dL (ref 6.0–8.3)

## 2024-08-25 LAB — CBC WITH DIFFERENTIAL/PLATELET
Basophils Absolute: 0.1 K/uL (ref 0.0–0.1)
Basophils Relative: 1.2 % (ref 0.0–3.0)
Eosinophils Absolute: 0.2 K/uL (ref 0.0–0.7)
Eosinophils Relative: 4.6 % (ref 0.0–5.0)
HCT: 43.7 % (ref 36.0–46.0)
Hemoglobin: 14.4 g/dL (ref 12.0–15.0)
Lymphocytes Relative: 26.4 % (ref 12.0–46.0)
Lymphs Abs: 1.4 K/uL (ref 0.7–4.0)
MCHC: 32.9 g/dL (ref 30.0–36.0)
MCV: 88.9 fl (ref 78.0–100.0)
Monocytes Absolute: 0.6 K/uL (ref 0.1–1.0)
Monocytes Relative: 10.7 % (ref 3.0–12.0)
Neutro Abs: 3.1 K/uL (ref 1.4–7.7)
Neutrophils Relative %: 57.1 % (ref 43.0–77.0)
Platelets: 122 K/uL — ABNORMAL LOW (ref 150.0–400.0)
RBC: 4.91 Mil/uL (ref 3.87–5.11)
RDW: 13.8 % (ref 11.5–15.5)
WBC: 5.3 K/uL (ref 4.0–10.5)

## 2024-08-25 LAB — LIPID PANEL
Cholesterol: 327 mg/dL — ABNORMAL HIGH (ref 28–200)
HDL: 36.6 mg/dL — ABNORMAL LOW
LDL Cholesterol: 220 mg/dL — ABNORMAL HIGH (ref 10–99)
NonHDL: 290.48
Total CHOL/HDL Ratio: 9
Triglycerides: 350 mg/dL — ABNORMAL HIGH (ref 10.0–149.0)
VLDL: 70 mg/dL — ABNORMAL HIGH (ref 0.0–40.0)

## 2024-08-25 LAB — VITAMIN B12: Vitamin B-12: 558 pg/mL (ref 211–911)

## 2024-08-25 LAB — HEMOGLOBIN A1C: Hgb A1c MFr Bld: 9.4 % — ABNORMAL HIGH (ref 4.6–6.5)

## 2024-08-25 LAB — TSH: TSH: 26.41 u[IU]/mL — ABNORMAL HIGH (ref 0.35–5.50)

## 2024-08-25 LAB — MICROALBUMIN / CREATININE URINE RATIO
Creatinine,U: 104.1 mg/dL
Microalb Creat Ratio: 11 mg/g (ref 0.0–30.0)
Microalb, Ur: 1.1 mg/dL (ref 0.7–1.9)

## 2024-08-29 ENCOUNTER — Ambulatory Visit: Payer: Self-pay | Admitting: Family Medicine

## 2024-09-01 ENCOUNTER — Encounter: Payer: Self-pay | Admitting: Family Medicine

## 2024-09-01 ENCOUNTER — Telehealth: Payer: Self-pay | Admitting: Family Medicine

## 2024-09-01 ENCOUNTER — Ambulatory Visit (INDEPENDENT_AMBULATORY_CARE_PROVIDER_SITE_OTHER): Payer: Self-pay | Admitting: Family Medicine

## 2024-09-01 VITALS — BP 164/110 | HR 77 | Temp 97.9°F | Ht 63.58 in | Wt 168.6 lb

## 2024-09-01 DIAGNOSIS — E785 Hyperlipidemia, unspecified: Secondary | ICD-10-CM

## 2024-09-01 DIAGNOSIS — Z0000 Encounter for general adult medical examination without abnormal findings: Secondary | ICD-10-CM

## 2024-09-01 DIAGNOSIS — Z2839 Other underimmunization status: Secondary | ICD-10-CM

## 2024-09-01 DIAGNOSIS — E1169 Type 2 diabetes mellitus with other specified complication: Secondary | ICD-10-CM

## 2024-09-01 DIAGNOSIS — D696 Thrombocytopenia, unspecified: Secondary | ICD-10-CM

## 2024-09-01 DIAGNOSIS — R2231 Localized swelling, mass and lump, right upper limb: Secondary | ICD-10-CM

## 2024-09-01 DIAGNOSIS — I10 Essential (primary) hypertension: Secondary | ICD-10-CM

## 2024-09-01 DIAGNOSIS — Z23 Encounter for immunization: Secondary | ICD-10-CM

## 2024-09-01 DIAGNOSIS — Z7984 Long term (current) use of oral hypoglycemic drugs: Secondary | ICD-10-CM

## 2024-09-01 DIAGNOSIS — E039 Hypothyroidism, unspecified: Secondary | ICD-10-CM

## 2024-09-01 DIAGNOSIS — J4540 Moderate persistent asthma, uncomplicated: Secondary | ICD-10-CM

## 2024-09-01 DIAGNOSIS — Z8673 Personal history of transient ischemic attack (TIA), and cerebral infarction without residual deficits: Secondary | ICD-10-CM

## 2024-09-01 MED ORDER — QVAR REDIHALER 80 MCG/ACT IN AERB: 1.0000 | INHALATION_SPRAY | Freq: Two times a day (BID) | RESPIRATORY_TRACT | 11 refills | Status: AC

## 2024-09-01 MED ORDER — AIRSUPRA 90-80 MCG/ACT IN AERO
1.0000 | INHALATION_SPRAY | Freq: Four times a day (QID) | RESPIRATORY_TRACT | 3 refills | Status: AC | PRN
  Filled 2024-10-05: qty 10.7, 25d supply, fill #0
  Filled 2024-10-05 – 2024-10-06 (×2): qty 10.7, 30d supply, fill #0

## 2024-09-01 MED ORDER — ATORVASTATIN CALCIUM 80 MG PO TABS
80.0000 mg | ORAL_TABLET | Freq: Every day | ORAL | 3 refills | Status: AC
  Filled 2024-10-05 – 2024-10-06 (×2): qty 90, 90d supply, fill #0

## 2024-09-01 MED ORDER — AMLODIPINE BESYLATE 5 MG PO TABS
5.0000 mg | ORAL_TABLET | Freq: Every day | ORAL | 1 refills | Status: AC
  Filled 2024-10-05: qty 90, 90d supply, fill #0

## 2024-09-01 MED ORDER — LISINOPRIL-HYDROCHLOROTHIAZIDE 20-25 MG PO TABS
1.0000 | ORAL_TABLET | Freq: Every day | ORAL | 3 refills | Status: AC
  Filled 2024-10-05: qty 90, 90d supply, fill #0

## 2024-09-01 MED ORDER — ALBUTEROL SULFATE HFA 108 (90 BASE) MCG/ACT IN AERS: 2.0000 | INHALATION_SPRAY | Freq: Four times a day (QID) | RESPIRATORY_TRACT | 3 refills | Status: DC | PRN

## 2024-09-01 MED ORDER — LEVOTHYROXINE SODIUM 150 MCG PO TABS: 150.0000 ug | ORAL_TABLET | Freq: Every morning | ORAL | 3 refills | Status: DC

## 2024-09-01 MED ORDER — AMLODIPINE BESYLATE 5 MG PO TABS: 5.0000 mg | ORAL_TABLET | Freq: Every day | ORAL | 1 refills | Status: DC

## 2024-09-01 MED ORDER — LEVOTHYROXINE SODIUM 175 MCG PO TABS
175.0000 ug | ORAL_TABLET | Freq: Every morning | ORAL | 3 refills | Status: AC
  Filled 2024-10-05 – 2024-10-06 (×2): qty 90, 90d supply, fill #0

## 2024-09-01 MED ORDER — METFORMIN HCL ER 500 MG PO TB24
1000.0000 mg | ORAL_TABLET | Freq: Every day | ORAL | 3 refills | Status: AC
  Filled 2024-10-05: qty 180, 90d supply, fill #0

## 2024-09-01 MED ORDER — MONTELUKAST SODIUM 10 MG PO TABS
10.0000 mg | ORAL_TABLET | Freq: Every day | ORAL | 3 refills | Status: AC
  Filled 2024-10-05 – 2024-10-06 (×2): qty 90, 90d supply, fill #0

## 2024-09-01 MED ORDER — PIOGLITAZONE HCL 30 MG PO TABS
30.0000 mg | ORAL_TABLET | Freq: Every day | ORAL | 3 refills | Status: AC
  Filled 2024-10-05: qty 90, 90d supply, fill #0

## 2024-09-01 NOTE — Patient Instructions (Addendum)
 Pedire que la contacten sobre repetir mamograma (con beca).  He puesto nueva remision para doctor de ojos para examen de diabetes.  He rellenado medicamentos - asegurese que este tomando atorvastatin  80mg  diarios. Elihu dosis de levothyroxine  a 175mcg diarios.  Comienze nuevo inhalador AirSupra en vez de albuterol  Use Qvar  inhalador diario de control.  Comenze nueva medicine amlodipine  5mg  diarios para presion arteria - adicionalmente a lisinopril /hydrochlorothiazide   Regresar en 3 meses para proximo control.  Use crema voltaren  topical para dolor de dedo nodulo inflamado.

## 2024-09-01 NOTE — Telephone Encounter (Signed)
 Emailed [facility] team members at the request of her PCP for mammogram screening.

## 2024-09-01 NOTE — Progress Notes (Unsigned)
 " Ph: [phone] Fax: [phone]   Patient ID: [name], female    DOB: [birth year], 53 y.o.   MRN: [id number]  This visit was conducted in person.  BP (!) 164/110 (BP Location: Right Arm, Patient Position: Sitting, Cuff Size: Normal)   Pulse 77   Temp 97.9 F (36.6 C) (Oral)   Ht 5' 3.58 (1.615 m)   Wt 168 lb 9.6 oz (76.5 kg)   LMP 11/12/2011   SpO2 97%   BMI 29.32 kg/m   BP Readings from Last 3 Encounters:  09/01/24 (!) 164/110  01/20/24 127/87  10/28/23 124/83   CC: CPE - converted to acute visit  Subjective:   HPI: [name] is a 53 y.o. female presenting on 09/01/2024 for Annual Exam (Pt needs vax for immigration //Pt hasn't gone to eye doctor//Pt has bump on right middle finger that is painful and growing, onset 6 months)   Self pay patient. She lives in [place].  Last seen 07/2023.  Received recent medical exam for immigration status - needs several vaccines today (MMR, V, hep B, polio and flu) as well as evidence of antihypertensive management due to high blood pressures.   6 months of painful growing bump to right middle finger . No h/o gout.  Lab Results  Component Value Date   LABURIC 5.1 08/30/2008    DM - continues metformin  XR 1000mg  daily. Pancreatitis to GLP1RA  Trulicity  07/2022 (acute interstitial edematous pancreatitis.    HLD - on atorvastatin  40mg  daily - ran out 2 days ago.    Asthma - intermittent dyspnea with wheezing since COVID 2022. On albuterol  inh PRN - uses twice weekly.   Preventative: COLONOSCOPY 10/2014 1 hyperplastic polyp, rpt 10 yrs [facility]) Colonoscopy 02/2019 - anal fissure, polyps (adenoma), rpt 7 yrs ([place] @ [facility]) Well woman - s/p hysterectomy 2013 for heavy bleeding. No recurrent vaginal bleeding. s/p lap BSO unsuccessful attempt 11/2019 due to extensive adhesions, unable to visualize adnexa  Mammogram 11/2021 - [facility] [place] - due for rpt  Lung cancer  screening - not eligible  Flu shot - intermittent COVID vaccine - Pfizer 12/2019, 01/2020, no booster Td 2003, Tdap 01/2017  Shingrix  - 03/2023  Other vaccines needed as per above.  Seat belt use discussed  Sunscreen use discussed. No changing moles on skin.  Non smoker  No alcohol  Dentist - due Eye exam - due   Caffeine: none Lives with husband and 1 daughter (2012) Occupation: working part time agricultural engineer  Edu: 9th grade Activity: walks daily 1/2-1 hour Diet: good water, fruits/vegetables daily     Relevant past medical, surgical, family and social history reviewed and updated as indicated. Interim medical history since our last visit reviewed. Allergies and medications reviewed and updated. Outpatient Medications Prior to Visit  Medication Sig Dispense Refill   acetaminophen  (TYLENOL ) 500 MG tablet Take 1,000 mg by mouth as needed for moderate pain.      aspirin  81 MG tablet Take 1 tablet (81 mg total) by mouth daily.     Blood Glucose Monitoring Suppl (BLOOD GLUCOSE MONITOR SYSTEM) w/Device KIT Use to test blood sugar in the morning, at noon, and at bedtime. 1 kit 0   Calcium  Citrate-Vitamin D (CALCIUM  + D PO) Take 1 tablet by mouth daily.      cyanocobalamin (VITAMIN B12) 1000 MCG tablet Take 1 tablet (1,000 mcg total) by mouth daily.     Moringa Oleifera (MORINGA PO) Take  by mouth.     vitamin E 180 MG (400 UNITS) capsule Take 400 Units by mouth daily.     albuterol  (VENTOLIN  HFA) 108 (90 Base) MCG/ACT inhaler INHALE 2 PUFFS INTO THE LUNGS EVERY 6 HOURS AS NEEDED FOR WHEEZING OR SHORTNESS OF BREATH 6.7 g 3   atorvastatin  (LIPITOR) 80 MG tablet Take 1 tablet (80 mg total) by mouth daily. 90 tablet 3   azithromycin  (ZITHROMAX  Z-PAK) 250 MG tablet Take 2 pills (500mg ) first day and one pill (250mg ) the remaining 4 days. (Patient not taking: Reported on 09/01/2024) 6 tablet 0   beclomethasone (QVAR  REDIHALER) 80 MCG/ACT inhaler Inhale 1 puff into the lungs 2 (two) times daily.  10.6 g 11   benzonatate  (TESSALON ) 100 MG capsule Take 1 capsule (100 mg total) by mouth every 8 (eight) hours. (Patient not taking: Reported on 09/01/2024) 21 capsule 0   docusate sodium  (COLACE) 100 MG capsule Take 1 capsule (100 mg total) by mouth every 12 (twelve) hours. (Patient not taking: Reported on 09/01/2024) 60 capsule 0   fluticasone  (FLONASE ) 50 MCG/ACT nasal spray Place 2 sprays into both nostrils daily. (Patient not taking: Reported on 09/01/2024) 16 mL 0   hydrocortisone  (ANUSOL -HC) 2.5 % rectal cream Place 1 Application rectally 2 (two) times daily. (Patient not taking: Reported on 09/01/2024) 30 g 0   levothyroxine  (SYNTHROID ) 150 MCG tablet Take 1 tablet (150 mcg total) by mouth every morning. 90 tablet 0   lisinopril -hydrochlorothiazide  (ZESTORETIC ) 20-25 MG tablet Take 1 tablet by mouth daily. Needs annual physical appointment for additional refills 90 tablet 0   metFORMIN  (GLUCOPHAGE -XR) 500 MG 24 hr tablet Take 2 tablets by mouth daily with breakfast. 180 tablet 4   montelukast  (SINGULAIR ) 10 MG tablet Take 1 tablet (10 mg total) by mouth at bedtime. 90 tablet 4   ondansetron  (ZOFRAN -ODT) 4 MG disintegrating tablet Take 1 tablet (4 mg total) by mouth every 8 (eight) hours as needed for nausea or vomiting. (Patient not taking: Reported on 09/01/2024) 20 tablet 0   pioglitazone  (ACTOS ) 15 MG tablet Take 1 tablet (15 mg total) by mouth daily. (Patient not taking: Reported on 09/01/2024) 90 tablet 0   promethazine -dextromethorphan (PROMETHAZINE -DM) 6.25-15 MG/5ML syrup Take 5 mLs by mouth at bedtime as needed for cough. (Patient not taking: Reported on 09/01/2024) 118 mL 0   silver  sulfADIAZINE  (SILVADENE ) 1 % cream Apply 1 Application topically daily. (Patient not taking: Reported on 09/01/2024) 50 g 0   No facility-administered medications prior to visit.     Per HPI unless specifically indicated in ROS section below Review of Systems  Objective:  BP (!) 164/110 (BP  Location: Right Arm, Patient Position: Sitting, Cuff Size: Normal)   Pulse 77   Temp 97.9 F (36.6 C) (Oral)   Ht 5' 3.58 (1.615 m)   Wt 168 lb 9.6 oz (76.5 kg)   LMP 11/12/2011   SpO2 97%   BMI 29.32 kg/m   Wt Readings from Last 3 Encounters:  09/01/24 168 lb 9.6 oz (76.5 kg)  07/10/23 157 lb 6 oz (71.4 kg)  05/08/23 163 lb (73.9 kg)      Physical Exam Vitals and nursing note reviewed.  Constitutional:      Appearance: Normal appearance. She is not ill-appearing.  HENT:     Head: Normocephalic and atraumatic.     Right Ear: Tympanic membrane, ear canal and external ear normal. There is no impacted cerumen.     Left Ear: Tympanic membrane, ear canal and external  ear normal. There is no impacted cerumen.     Mouth/Throat:     Mouth: Mucous membranes are moist.     Pharynx: Oropharynx is clear. No oropharyngeal exudate or posterior oropharyngeal erythema.  Eyes:     General:        Right eye: No discharge.        Left eye: No discharge.     Extraocular Movements: Extraocular movements intact.     Conjunctiva/sclera: Conjunctivae normal.     Pupils: Pupils are equal, round, and reactive to light.  Neck:     Thyroid : No thyroid  mass or thyromegaly.  Cardiovascular:     Rate and Rhythm: Normal rate and regular rhythm.     Pulses: Normal pulses.     Heart sounds: Normal heart sounds. No murmur heard. Pulmonary:     Effort: Pulmonary effort is normal. No respiratory distress.     Breath sounds: Normal breath sounds. No wheezing, rhonchi or rales.  Abdominal:     General: Bowel sounds are normal. There is no distension.     Palpations: Abdomen is soft. There is no mass.     Tenderness: There is abdominal tenderness (mild) in the suprapubic area. There is no guarding or rebound. Negative signs include Murphy's sign.     Hernia: No hernia is present.  Musculoskeletal:     Cervical back: Normal range of motion and neck supple. No rigidity.     Right lower leg: No edema.      Left lower leg: No edema.  Lymphadenopathy:     Cervical: No cervical adenopathy.  Skin:    General: Skin is warm and dry.     Findings: No rash.  Neurological:     General: No focal deficit present.     Mental Status: She is alert. Mental status is at baseline.  Psychiatric:        Mood and Affect: Mood normal.        Behavior: Behavior normal.       Results for orders placed or performed in visit on 08/25/24  Vitamin B12   Collection Time: 08/25/24  8:41 AM  Result Value Ref Range   Vitamin B-12 558 211 - 911 pg/mL  TSH   Collection Time: 08/25/24  8:41 AM  Result Value Ref Range   TSH 26.41 (H) 0.35 - 5.50 uIU/mL  CBC with Differential/Platelet   Collection Time: 08/25/24  8:41 AM  Result Value Ref Range   WBC 5.3 4.0 - 10.5 K/uL   RBC 4.91 3.87 - 5.11 Mil/uL   Hemoglobin 14.4 12.0 - 15.0 g/dL   HCT 56.2 63.9 - 53.9 %   MCV 88.9 78.0 - 100.0 fl   MCHC 32.9 30.0 - 36.0 g/dL   RDW 86.1 88.4 - 84.4 %   Platelets 122.0 (L) 150.0 - 400.0 K/uL   Neutrophils Relative % 57.1 43.0 - 77.0 %   Lymphocytes Relative 26.4 12.0 - 46.0 %   Monocytes Relative 10.7 3.0 - 12.0 %   Eosinophils Relative 4.6 0.0 - 5.0 %   Basophils Relative 1.2 0.0 - 3.0 %   Neutro Abs 3.1 1.4 - 7.7 K/uL   Lymphs Abs 1.4 0.7 - 4.0 K/uL   Monocytes Absolute 0.6 0.1 - 1.0 K/uL   Eosinophils Absolute 0.2 0.0 - 0.7 K/uL   Basophils Absolute 0.1 0.0 - 0.1 K/uL  Microalbumin / creatinine urine ratio   Collection Time: 08/25/24  8:41 AM  Result Value Ref Range   Microalb,  Ur 1.1 0.7 - 1.9 mg/dL   Creatinine,U 895.8 mg/dL   Microalb Creat Ratio 11.0 0.0 - 30.0 mg/g  Hemoglobin A1c   Collection Time: 08/25/24  8:41 AM  Result Value Ref Range   Hgb A1c MFr Bld 9.4 (H) 4.6 - 6.5 %  Comprehensive metabolic panel with GFR   Collection Time: 08/25/24  8:41 AM  Result Value Ref Range   Sodium 134 (L) 135 - 145 mEq/L   Potassium 4.5 3.5 - 5.1 mEq/L   Chloride 96 96 - 112 mEq/L   CO2 29 19 - 32 mEq/L    Glucose, Bld 200 (H) 70 - 99 mg/dL   BUN 17 6 - 23 mg/dL   Creatinine, Ser 9.09 0.40 - 1.20 mg/dL   Total Bilirubin 0.3 0.2 - 1.2 mg/dL   Alkaline Phosphatase 109 39 - 117 U/L   AST 28 5 - 37 U/L   ALT 57 (H) 3 - 35 U/L   Total Protein 7.8 6.0 - 8.3 g/dL   Albumin 4.5 3.5 - 5.2 g/dL   GFR 27.16 >39.99 mL/min   Calcium  9.7 8.4 - 10.5 mg/dL  Lipid panel   Collection Time: 08/25/24  8:41 AM  Result Value Ref Range   Cholesterol 327 (H) 28 - 200 mg/dL   Triglycerides 649.9 (H) 10.0 - 149.0 mg/dL   HDL 63.39 (L) >60.99 mg/dL   VLDL 29.9 (H) 0.0 - 59.9 mg/dL   LDL Cholesterol 779 (H) 10 - 99 mg/dL   Total CHOL/HDL Ratio 9    NonHDL 290.48     Assessment & Plan:   Problem List Items Addressed This Visit     Hypothyroidism   Relevant Medications   levothyroxine  (SYNTHROID ) 175 MCG tablet   Dyslipidemia associated with type 2 diabetes mellitus (HCC)   Relevant Medications   atorvastatin  (LIPITOR) 80 MG tablet   lisinopril -hydrochlorothiazide  (ZESTORETIC ) 20-25 MG tablet   metFORMIN  (GLUCOPHAGE -XR) 500 MG 24 hr tablet   pioglitazone  (ACTOS ) 30 MG tablet   HYPERTENSION, BENIGN SYSTEMIC   Relevant Medications   atorvastatin  (LIPITOR) 80 MG tablet   lisinopril -hydrochlorothiazide  (ZESTORETIC ) 20-25 MG tablet   amLODipine  (NORVASC ) 5 MG tablet   Type 2 diabetes mellitus with other specified complication (HCC) - Primary   Relevant Medications   atorvastatin  (LIPITOR) 80 MG tablet   lisinopril -hydrochlorothiazide  (ZESTORETIC ) 20-25 MG tablet   metFORMIN  (GLUCOPHAGE -XR) 500 MG 24 hr tablet   pioglitazone  (ACTOS ) 30 MG tablet   Other Relevant Orders   Ambulatory referral to Ophthalmology   Other Visit Diagnoses       Need for hepatitis B vaccination       Relevant Orders   Heplisav-B (HepB-CPG) Vaccine (Completed)     Need for MMR vaccine       Relevant Orders   MMR vaccine subcutaneous (Completed)     Need for varicella vaccine       Relevant Orders   Heplisav-B (HepB-CPG)  Vaccine (Completed)     Encounter for immunization       Relevant Orders   Flu vaccine trivalent PF, 6mos and older(Flulaval,Afluria,Fluarix,Fluzone) (Completed)        Meds ordered this encounter  Medications   DISCONTD: albuterol  (VENTOLIN  HFA) 108 (90 Base) MCG/ACT inhaler    Sig: INHALE 2 PUFFS INTO THE LUNGS EVERY 6 HOURS AS NEEDED FOR WHEEZING OR SHORTNESS OF BREATH    Dispense:  6.7 g    Refill:  3   atorvastatin  (LIPITOR) 80 MG tablet    Sig: Take 1  tablet (80 mg total) by mouth daily.    Dispense:  90 tablet    Refill:  3   beclomethasone (QVAR  REDIHALER) 80 MCG/ACT inhaler    Sig: Inhale 1 puff into the lungs 2 (two) times daily.    Dispense:  10.6 g    Refill:  11    Use this dose   DISCONTD: levothyroxine  (SYNTHROID ) 150 MCG tablet    Sig: Take 1 tablet (150 mcg total) by mouth every morning.    Dispense:  90 tablet    Refill:  3   lisinopril -hydrochlorothiazide  (ZESTORETIC ) 20-25 MG tablet    Sig: Take 1 tablet by mouth daily. Needs annual physical appointment for additional refills    Dispense:  90 tablet    Refill:  3   metFORMIN  (GLUCOPHAGE -XR) 500 MG 24 hr tablet    Sig: Take 2 tablets by mouth daily with breakfast.    Dispense:  180 tablet    Refill:  3   montelukast  (SINGULAIR ) 10 MG tablet    Sig: Take 1 tablet (10 mg total) by mouth at bedtime.    Dispense:  90 tablet    Refill:  3   pioglitazone  (ACTOS ) 30 MG tablet    Sig: Take 1 tablet (30 mg total) by mouth daily.    Dispense:  90 tablet    Refill:  3   levothyroxine  (SYNTHROID ) 175 MCG tablet    Sig: Take 1 tablet (175 mcg total) by mouth every morning.    Dispense:  90 tablet    Refill:  3    Note new dose   Albuterol -Budesonide (AIRSUPRA) 90-80 MCG/ACT AERO    Sig: Inhale 1-2 puffs into the lungs every 6 (six) hours as needed (cough, wheeze, shortness of breath).    Dispense:  10.7 g    Refill:  3   DISCONTD: amLODipine  (NORVASC ) 5 MG tablet    Sig: Take 1 tablet (5 mg total) by mouth  daily.    Dispense:  90 tablet    Refill:  1   amLODipine  (NORVASC ) 5 MG tablet    Sig: Take 1 tablet (5 mg total) by mouth daily.    Dispense:  90 tablet    Refill:  1    Orders Placed This Encounter  Procedures   Heplisav-B (HepB-CPG) Vaccine   MMR vaccine subcutaneous   Varicella vaccine subcutaneous   Flu vaccine trivalent PF, 6mos and older(Flulaval,Afluria,Fluarix,Fluzone)   Ambulatory referral to Ophthalmology    Referral Priority:   Routine    Referral Type:   Consultation    Referral Reason:   Specialty Services Required    Requested Specialty:   Ophthalmology    Number of Visits Requested:   1    Patient Instructions  Pedire que la contacten sobre repetir mamograma (con beca).  He puesto nueva remision para doctor de ojos para examen de diabetes.  He rellenado medicamentos - asegurese que este tomando atorvastatin  80mg  diarios. Suba dosis de levothyroxine  a 175mcg diarios.  Comienze nuevo inhalador AirSupra en vez de albuterol  Use Qvar  inhalador diario de control.  Comenze nueva medicine amlodipine  5mg  diarios para presion arteria - adicionalmente a lisinopril /hydrochlorothiazide   Regresar en 3 meses para proximo control.  Use crema voltaren  topical para dolor de dedo nodulo inflamado.   Follow up plan: Return in about 3 months (around 11/30/2024), or if symptoms worsen or fail to improve, for follow up visit.  Anton Blas, MD   "

## 2024-09-03 DIAGNOSIS — R2231 Localized swelling, mass and lump, right upper limb: Secondary | ICD-10-CM | POA: Insufficient documentation

## 2024-09-03 DIAGNOSIS — Z2839 Other underimmunization status: Secondary | ICD-10-CM | POA: Insufficient documentation

## 2024-09-03 NOTE — Assessment & Plan Note (Addendum)
 Chronic, markedly elevated - suspect she may not be taking atorvastatin  80mg  but she thinks she is - I asked her to verify this when she gets home, will recommend she bring in all meds to next visit.  The ASCVD Risk score (Arnett DK, et al., 2019) failed to calculate for the following reasons:   Risk score cannot be calculated because patient has a medical history suggesting prior/existing ASCVD   * - Cholesterol units were assumed

## 2024-09-03 NOTE — Assessment & Plan Note (Addendum)
 Chronic, deteriorated - will increase levothyroxine  to 175mcg daily.  Reassess control next visit.

## 2024-09-03 NOTE — Assessment & Plan Note (Addendum)
 Chronic issue. ?liver disease related - with mild transaminitis (ALT). ?ITP. Continue to monitor.  CT 2023 - normal liver.

## 2024-09-03 NOTE — Assessment & Plan Note (Addendum)
 Chronic, above goal.  She is on metformin  XR 1000mg  bid.  Consider glipizide vs actos .  H/o pancreatitis with Trulicity  (2023) Referral placed to diabetic eye exam.

## 2024-09-03 NOTE — Assessment & Plan Note (Signed)
 Needs immunizations for immigration status including flu, polio, Hep B, MMR, Varicella.  She is UTD on Tdap (2018).  Will provide all but polio as we do not have individual polio vaccine in office - discussed she will need to go to health dept for this.  Letter written for immigration exam.

## 2024-09-03 NOTE — Assessment & Plan Note (Signed)
 Continue statin, aspirin  No residual neurological deficit

## 2024-09-03 NOTE — Assessment & Plan Note (Addendum)
 Preventative protocols reviewed and updated unless pt declined. Discussed healthy diet and lifestyle.  Requested mammogram scholarship team contact patient to schedule this as she's self pay.  All meds refilled.

## 2024-09-03 NOTE — Assessment & Plan Note (Signed)
 Chronic, deteriorated since COVID 2022  She ran out of inhalers. Rx qvar  controller inhaler, trial AirSupra rescue inhaler to replace albuterol  inhaler. Airsupra coupon provided.

## 2024-09-03 NOTE — Assessment & Plan Note (Signed)
 Present for 6 months. ?tophus - check urate next labs. For now recommend topical voltaren 

## 2024-09-03 NOTE — Assessment & Plan Note (Addendum)
 Chronic, elevated despite zestoretic  20/25mg  daily.  Add amlodipine  5mg  daily. Letter written for immigration exam per pt request .  RTC 61mo HTN f/u visit.

## 2024-09-04 ENCOUNTER — Telehealth: Payer: Self-pay

## 2024-09-04 ENCOUNTER — Other Ambulatory Visit (HOSPITAL_COMMUNITY): Payer: Self-pay

## 2024-09-04 NOTE — Telephone Encounter (Addendum)
 Pharmacy Patient Advocate Encounter   Received notification from Onbase that prior authorization for Airsupra 90-80MCG/ACT aerosol is required/requested.   Insurance verification completed.   The patient is insured through [facility] [employer]     Per test claim, medication is not covered due to plan/benefit exclusion, PA not submitted at this time

## 2024-09-04 NOTE — Telephone Encounter (Signed)
 Copied from CRM [id number]. Topic: Clinical - Prescription Issue >> Sep 04, 2024 12:47 PM Jayma L wrote: Reason for CRM: patient called and switched her pharmacy asking the meds that was sent to old pharmacy be sent to new one in chart- [place]. . They were too much money at other location   Best phone number for patient is [phone]

## 2024-09-10 ENCOUNTER — Other Ambulatory Visit: Payer: Self-pay | Admitting: Obstetrics and Gynecology

## 2024-09-10 DIAGNOSIS — Z1231 Encounter for screening mammogram for malignant neoplasm of breast: Secondary | ICD-10-CM

## 2024-09-10 NOTE — Telephone Encounter (Signed)
 Received notice today that patient was scheduled.

## 2024-09-11 NOTE — Telephone Encounter (Signed)
 Insurance will not cover even if sent to [facility], please see previous patient advocate encounter. Different rx may be needed  Please advise

## 2024-09-14 ENCOUNTER — Other Ambulatory Visit (HOSPITAL_COMMUNITY): Payer: Self-pay

## 2024-10-05 ENCOUNTER — Other Ambulatory Visit (HOSPITAL_COMMUNITY): Payer: Self-pay

## 2024-10-05 ENCOUNTER — Other Ambulatory Visit: Payer: Self-pay

## 2024-10-05 MED ORDER — LEVOTHYROXINE SODIUM 150 MCG PO TABS
150.0000 ug | ORAL_TABLET | Freq: Every day | ORAL | 3 refills | Status: DC
  Filled 2024-10-05: qty 90, 90d supply, fill #0

## 2024-10-05 MED ORDER — ALBUTEROL SULFATE HFA 108 (90 BASE) MCG/ACT IN AERS
2.0000 | INHALATION_SPRAY | Freq: Four times a day (QID) | RESPIRATORY_TRACT | 3 refills | Status: AC | PRN
  Filled 2024-10-05: qty 6.7, 25d supply, fill #0

## 2024-10-06 ENCOUNTER — Other Ambulatory Visit: Payer: Self-pay

## 2024-11-17 ENCOUNTER — Ambulatory Visit: Payer: Self-pay

## 2024-11-27 ENCOUNTER — Ambulatory Visit: Payer: Self-pay | Admitting: Family Medicine
